# Patient Record
Sex: Male | Born: 1940 | Race: White | Hispanic: No | Smoking: Never smoker
Health system: Southern US, Community
[De-identification: ages and names within clinical notes are randomized; demographics above are authoritative.]

## PROBLEM LIST (undated history)

## (undated) DIAGNOSIS — N289 Disorder of kidney and ureter, unspecified: Secondary | ICD-10-CM

## (undated) DIAGNOSIS — E119 Type 2 diabetes mellitus without complications: Secondary | ICD-10-CM

## (undated) DIAGNOSIS — B59 Pneumocystosis: Secondary | ICD-10-CM

## (undated) DIAGNOSIS — I10 Essential (primary) hypertension: Secondary | ICD-10-CM

## (undated) DIAGNOSIS — IMO0001 Reserved for inherently not codable concepts without codable children: Secondary | ICD-10-CM

## (undated) DIAGNOSIS — I776 Arteritis, unspecified: Secondary | ICD-10-CM

## (undated) DIAGNOSIS — I7782 Antineutrophilic cytoplasmic antibody (ANCA) vasculitis: Secondary | ICD-10-CM

## (undated) DIAGNOSIS — I2510 Atherosclerotic heart disease of native coronary artery without angina pectoris: Secondary | ICD-10-CM

---

## 2015-12-25 ENCOUNTER — Observation Stay (HOSPITAL_COMMUNITY)
Admission: EM | Admit: 2015-12-25 | Discharge: 2015-12-27 | Disposition: A | Discharge status: Home / Self Care | Payer: Medicare HMO | Attending: Internal Medicine | Admitting: Internal Medicine

## 2015-12-25 ENCOUNTER — Encounter (HOSPITAL_COMMUNITY): Payer: Self-pay | Admitting: Emergency Medicine

## 2015-12-25 ENCOUNTER — Emergency Department (HOSPITAL_COMMUNITY): Payer: Medicare HMO

## 2015-12-25 DIAGNOSIS — I2510 Atherosclerotic heart disease of native coronary artery without angina pectoris: Secondary | ICD-10-CM | POA: Diagnosis not present

## 2015-12-25 DIAGNOSIS — W000XXA Fall on same level due to ice and snow, initial encounter: Secondary | ICD-10-CM | POA: Diagnosis not present

## 2015-12-25 DIAGNOSIS — S0990XA Unspecified injury of head, initial encounter: Secondary | ICD-10-CM | POA: Diagnosis present

## 2015-12-25 DIAGNOSIS — N186 End stage renal disease: Secondary | ICD-10-CM | POA: Diagnosis not present

## 2015-12-25 DIAGNOSIS — I509 Heart failure, unspecified: Secondary | ICD-10-CM | POA: Diagnosis not present

## 2015-12-25 DIAGNOSIS — E1122 Type 2 diabetes mellitus with diabetic chronic kidney disease: Secondary | ICD-10-CM | POA: Diagnosis not present

## 2015-12-25 DIAGNOSIS — Z992 Dependence on renal dialysis: Secondary | ICD-10-CM | POA: Diagnosis not present

## 2015-12-25 DIAGNOSIS — S0101XA Laceration without foreign body of scalp, initial encounter: Secondary | ICD-10-CM | POA: Insufficient documentation

## 2015-12-25 DIAGNOSIS — S065X2A Traumatic subdural hemorrhage with loss of consciousness of 31 minutes to 59 minutes, initial encounter: Secondary | ICD-10-CM | POA: Diagnosis not present

## 2015-12-25 DIAGNOSIS — F22 Delusional disorders: Secondary | ICD-10-CM | POA: Diagnosis not present

## 2015-12-25 DIAGNOSIS — Z7952 Long term (current) use of systemic steroids: Secondary | ICD-10-CM | POA: Diagnosis not present

## 2015-12-25 DIAGNOSIS — N250 Renal osteodystrophy: Secondary | ICD-10-CM | POA: Insufficient documentation

## 2015-12-25 DIAGNOSIS — S065X9A Traumatic subdural hemorrhage with loss of consciousness of unspecified duration, initial encounter: Secondary | ICD-10-CM

## 2015-12-25 DIAGNOSIS — Z794 Long term (current) use of insulin: Secondary | ICD-10-CM | POA: Insufficient documentation

## 2015-12-25 DIAGNOSIS — I132 Hypertensive heart and chronic kidney disease with heart failure and with stage 5 chronic kidney disease, or end stage renal disease: Secondary | ICD-10-CM | POA: Diagnosis not present

## 2015-12-25 DIAGNOSIS — S065XAA Traumatic subdural hemorrhage with loss of consciousness status unknown, initial encounter: Secondary | ICD-10-CM

## 2015-12-25 DIAGNOSIS — D631 Anemia in chronic kidney disease: Secondary | ICD-10-CM | POA: Diagnosis not present

## 2015-12-25 DIAGNOSIS — S065X2D Traumatic subdural hemorrhage with loss of consciousness of 31 minutes to 59 minutes, subsequent encounter: Secondary | ICD-10-CM

## 2015-12-25 DIAGNOSIS — S0100XA Unspecified open wound of scalp, initial encounter: Secondary | ICD-10-CM

## 2015-12-25 DIAGNOSIS — N189 Chronic kidney disease, unspecified: Secondary | ICD-10-CM

## 2015-12-25 HISTORY — DX: Disorder of kidney and ureter, unspecified: N28.9

## 2015-12-25 HISTORY — DX: Atherosclerotic heart disease of native coronary artery without angina pectoris: I25.10

## 2015-12-25 HISTORY — DX: Type 2 diabetes mellitus without complications: E11.9

## 2015-12-25 HISTORY — DX: Essential (primary) hypertension: I10

## 2015-12-25 LAB — CBC
HEMATOCRIT: 36.3 % — AB (ref 39.0–52.0)
Hemoglobin: 11.7 g/dL — ABNORMAL LOW (ref 13.0–17.0)
MCH: 28 pg (ref 26.0–34.0)
MCHC: 32.2 g/dL (ref 30.0–36.0)
MCV: 86.8 fL (ref 78.0–100.0)
Platelets: 203 10*3/uL (ref 150–400)
RBC: 4.18 MIL/uL — AB (ref 4.22–5.81)
RDW: 16.3 % — ABNORMAL HIGH (ref 11.5–15.5)
WBC: 10.3 10*3/uL (ref 4.0–10.5)

## 2015-12-25 LAB — APTT: APTT: 40 s — AB (ref 24–37)

## 2015-12-25 LAB — COMPREHENSIVE METABOLIC PANEL
ALT: 20 U/L (ref 17–63)
ANION GAP: 9 (ref 5–15)
AST: 19 U/L (ref 15–41)
Albumin: 2.7 g/dL — ABNORMAL LOW (ref 3.5–5.0)
Alkaline Phosphatase: 109 U/L (ref 38–126)
BUN: 28 mg/dL — ABNORMAL HIGH (ref 6–20)
CHLORIDE: 100 mmol/L — AB (ref 101–111)
CO2: 27 mmol/L (ref 22–32)
Calcium: 8.3 mg/dL — ABNORMAL LOW (ref 8.9–10.3)
Creatinine, Ser: 4.41 mg/dL — ABNORMAL HIGH (ref 0.61–1.24)
GFR, EST AFRICAN AMERICAN: 14 mL/min — AB (ref >60)
GFR, EST NON AFRICAN AMERICAN: 12 mL/min — AB (ref >60)
Glucose, Bld: 257 mg/dL — ABNORMAL HIGH (ref 65–99)
POTASSIUM: 4 mmol/L (ref 3.5–5.1)
Sodium: 136 mmol/L (ref 135–145)
Total Bilirubin: 0.8 mg/dL (ref 0.3–1.2)
Total Protein: 5.3 g/dL — ABNORMAL LOW (ref 6.5–8.1)

## 2015-12-25 LAB — CBG MONITORING, ED: Glucose-Capillary: 185 mg/dL — ABNORMAL HIGH (ref 65–99)

## 2015-12-25 LAB — PROTIME-INR
INR: 1.29 (ref 0.00–1.49)
PROTHROMBIN TIME: 16.2 s — AB (ref 11.6–15.2)

## 2015-12-25 MED ORDER — VITAMIN B-12 1000 MCG PO TABS
1000.0000 ug | ORAL_TABLET | Freq: Every day | ORAL | Status: DC
  Administered 2015-12-26 – 2015-12-27 (×2): 1000 ug via ORAL
  Filled 2015-12-25 (×2): qty 1

## 2015-12-25 MED ORDER — ONDANSETRON HCL 4 MG/2ML IJ SOLN: 4.0000 mg | Freq: Four times a day (QID) | INTRAMUSCULAR | Status: DC | PRN

## 2015-12-25 MED ORDER — SODIUM CHLORIDE 0.9 % IJ SOLN: 3.0000 mL | INTRAMUSCULAR | Status: DC | PRN

## 2015-12-25 MED ORDER — SODIUM CHLORIDE 0.9 % IV SOLN: 250.0000 mL | INTRAVENOUS | Status: DC | PRN

## 2015-12-25 MED ORDER — ONDANSETRON HCL 4 MG PO TABS: 4.0000 mg | ORAL_TABLET | Freq: Four times a day (QID) | ORAL | Status: DC | PRN

## 2015-12-25 MED ORDER — SODIUM CHLORIDE 0.9 % IJ SOLN
3.0000 mL | Freq: Two times a day (BID) | INTRAMUSCULAR | Status: DC
  Administered 2015-12-26 (×2): 3 mL via INTRAVENOUS

## 2015-12-25 MED ORDER — INSULIN ASPART 100 UNIT/ML ~~LOC~~ SOLN
0.0000 [IU] | Freq: Three times a day (TID) | SUBCUTANEOUS | Status: DC
  Administered 2015-12-26: 2 [IU] via SUBCUTANEOUS
  Administered 2015-12-26: 3 [IU] via SUBCUTANEOUS

## 2015-12-25 MED ORDER — SODIUM CHLORIDE 0.9 % IJ SOLN
3.0000 mL | Freq: Two times a day (BID) | INTRAMUSCULAR | Status: DC
  Administered 2015-12-26: 3 mL via INTRAVENOUS

## 2015-12-25 MED ORDER — FLUDROCORTISONE ACETATE 0.1 MG PO TABS
0.1000 mg | ORAL_TABLET | Freq: Every day | ORAL | Status: DC
  Administered 2015-12-26 – 2015-12-27 (×2): 0.1 mg via ORAL
  Filled 2015-12-25 (×2): qty 1

## 2015-12-25 NOTE — ED Notes (Addendum)
Per EMS: pt slipped on ice and fell and thinks had LOC; pt with laceration to back of head; bleeding controlled; pt dialysis pt with last dialysis was last Thursday; pt with unequal pupils per norm

## 2015-12-25 NOTE — H&P (Signed)
[facility] History and Physical  Quinten Allerton WJX:[id number] DOB: Mar 07, 1941 DOA: 12/25/2015  Referring physician:  Loren Racer, MD PCP: Theressa Stamps, MD   Chief Complaint: Fall with LOC  HPI: Jeffrey Walls is a 75 y.o. male with a past medical history of end-stage renal disease on hemodialysis, hypertension, type 2 diabetes who comes to the emergency department after having a fall at home in his driveway.   Per patient, he does not remember much of what happened. Apparently he slipped, hit the back of his head sustaining a laceration and then later woke up, and what he describes as a puddle of blood. After that, he felt some headache, dizziness, nausea and had 1 episode of emesis. He denies subsequently emesis and has no nausea at the moment. He denies any prodromal symptoms prior to the fall and states that this was strictly an accident. He denies vision changes, decreased sensory or motor weakness. He is currently in no acute distress.   Review of Systems:  Constitutional:  No weight loss, night sweats, Fevers, chills, fatigue.  HEENT:  No headaches, Difficulty swallowing,Tooth/dental problems,Sore throat,  No sneezing, itching, ear ache, nasal congestion, post nasal drip,  Cardio-vascular:  No chest pain, Orthopnea, PND, swelling in lower extremities, anasarca, dizziness, palpitations  GI:  Positive nausea, vomiting. No heartburn, indigestion, abdominal pain,  diarrhea, change in bowel habits, loss of appetite  Resp:  No shortness of breath with exertion or at rest. No excess mucus, no productive cough, No non-productive cough, No coughing up of blood.No change in color of mucus.No wheezing.No chest wall deformity  Skin:  No rash or lesions.  GU:  No dysuria, change in color of urine, no urgency or frequency. No flank pain.  Musculoskeletal:  No joint pain or swelling. No decreased range of motion. No back pain.  Psych:  No change in mood or affect. No  depression or anxiety. No memory loss.  Neuro: As above mentioned.  Past Medical History  Diagnosis Date  . Renal disorder   . Hypertension   . Diabetes mellitus without complication (HCC)   . Coronary artery disease    History reviewed. No pertinent past surgical history. Social History:  reports that he has never smoked. He does not have any smokeless tobacco history on file. He reports that he does not drink alcohol or use illicit drugs.  Allergies  Allergen Reactions  . Ampicillin     Ankle swelling  . Benadryl [Diphenhydramine Hcl]     Rash    History reviewed. No pertinent family history.    Prior to Admission medications   Medication Sig Start Date End Date Taking? Authorizing Provider  FLUDROCORTISONE ACETATE PO Take 0.1 mg by mouth daily.   Yes Historical Provider, MD  insulin aspart (NOVOLOG) 100 UNIT/ML injection Inject 2-6 Units into the skin 3 (three) times daily before meals. Per sliding scale   Yes Historical Provider, MD  vitamin B-12 (CYANOCOBALAMIN) 1000 MCG tablet Take 1,000 mcg by mouth daily.   Yes Historical Provider, MD   Physical Exam: Filed Vitals:   12/25/15 1403 12/25/15 1718 12/25/15 1848 12/25/15 2000  BP: 187/111 145/96 180/107 176/99  Pulse: 106 99 94 86  Temp: 97.9 F (36.6 C) 98.9 F (37.2 C)    TempSrc: Oral Oral    Resp: 20 18 18    SpO2: 97% 99% 99% 98%    Wt Readings from Last 3 Encounters:  No data found for Wt    General:  Appears calm and comfortable  Head: Occipital laceration with dressing in place. Eyes: Preexisting anisocoria, right pupil > left pupil normal lids, irises & conjunctiva ENT: grossly normal hearing, lips & tongue Neck: no LAD, masses or thyromegaly Cardiovascular: RRR, no m/r/g. No LE edema. Telemetry: SR, no arrhythmias  Respiratory: CTA bilaterally, no w/r/r. Normal respiratory effort. Abdomen: soft, ntnd Skin: no rash or induration seen on limited exam Musculoskeletal: grossly normal tone  BUE/BLE Psychiatric: grossly normal mood and affect, speech fluent and appropriate Neurologic: grossly non-focal.          Labs on Admission:  Basic Metabolic Panel:  Recent Labs Lab 12/25/15 1412  NA 136  K 4.0  CL 100*  CO2 27  GLUCOSE 257*  BUN 28*  CREATININE 4.41*  CALCIUM 8.3*   Liver Function Tests:  Recent Labs Lab 12/25/15 1412  AST 19  ALT 20  ALKPHOS 109  BILITOT 0.8  PROT 5.3*  ALBUMIN 2.7*   CBC:  Recent Labs Lab 12/25/15 1412  WBC 10.3  HGB 11.7*  HCT 36.3*  MCV 86.8  PLT 203    CBG:  Recent Labs Lab 12/25/15 1833  GLUCAP 185*    Radiological Exams on Admission: Ct Head Wo Contrast  12/25/2015  CLINICAL DATA:  Posttraumatic headache and posterior neck pain after fall slipping on ice. Positive loss of consciousness. EXAM: CT HEAD WITHOUT CONTRAST CT CERVICAL SPINE WITHOUT CONTRAST TECHNIQUE: Multidetector CT imaging of the head and cervical spine was performed following the standard protocol without intravenous contrast. Multiplanar CT image reconstructions of the cervical spine were also generated. COMPARISON:  None. FINDINGS: CT HEAD FINDINGS Bony calvarium appears intact. Left posterior scalp hematoma is noted. Mild diffuse cortical atrophy is noted. Mild chronic ischemic white matter disease is noted. Small left subdural hematoma is seen over left frontal region. Similar abnormality is seen over right frontal lobe. No mass effect or midline shift is noted. Ventricular size is within normal limits. No mass lesion or acute infarction is noted. CT CERVICAL SPINE FINDINGS No fracture is noted. Moderate degenerative disc disease is noted at C3-4, C4-5 and C5-6 with anterior and posterior osteophyte formation. Severe degenerative disc disease is noted at C6-7 and C7-T1 with anterior and posterior osteophyte formation. Grade 1 retrolisthesis of C4-5 is noted most likely due to degenerative disc disease. Visualized lung apices are unremarkable.  IMPRESSION: Left posterior scalp hematoma. Mild diffuse cortical atrophy. Mild chronic ischemic white matter disease. Small bilateral frontal subdural hematomas are noted without mass effect or midline shift. 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 No acute abnormality is noted. Critical Value/emergent results were called by telephone at the time of interpretation on 12/25/2015 at 7:27 pm to Dr. Loren Racer , who verbally acknowledged these results. Electronically Signed   By: Lupita Raider, M.D.   On: 12/25/2015 19:30   Ct Cervical Spine Wo Contrast  12/25/2015  CLINICAL DATA:  Posttraumatic headache and posterior neck pain after fall slipping on ice. Positive loss of consciousness. EXAM: CT HEAD WITHOUT CONTRAST CT CERVICAL SPINE WITHOUT CONTRAST TECHNIQUE: Multidetector CT imaging of the head and cervical spine was performed following the standard protocol without intravenous contrast. Multiplanar CT image reconstructions of the cervical spine were also generated. COMPARISON:  None. FINDINGS: CT HEAD FINDINGS Bony calvarium appears intact. Left posterior scalp hematoma is noted. Mild diffuse cortical atrophy is noted. Mild chronic ischemic white matter disease is noted. Small left subdural hematoma is seen over left frontal region. Similar abnormality is  seen over right frontal lobe. No mass effect or midline shift is noted. Ventricular size is within normal limits. No mass lesion or acute infarction is noted. CT CERVICAL SPINE FINDINGS No fracture is noted. Moderate degenerative disc disease is noted at C3-4, C4-5 and C5-6 with anterior and posterior osteophyte formation. Severe degenerative disc disease is noted at C6-7 and C7-T1 with anterior and posterior osteophyte formation. Grade 1 retrolisthesis of C4-5 is noted most likely due to degenerative disc disease. Visualized lung apices are unremarkable. IMPRESSION: Left posterior scalp hematoma. Mild  diffuse cortical atrophy. Mild chronic ischemic white matter disease. Small bilateral frontal subdural hematomas are noted without mass effect or midline shift. 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 No acute abnormality is noted. Critical Value/emergent results were called by telephone at the time of interpretation on 12/25/2015 at 7:27 pm to Dr. Loren RacerAVID YELVERTON , who verbally acknowledged these results. Electronically Signed   By: Lupita RaiderJames  Green Jr, M.D.   On: 12/25/2015 19:30     Assessment/Plan Principal Problem:   Subdural hematoma caused by concussion (HCC) Admit to telemetry for observation. Frequent neuro checks. Recheck CT scan of the head without contrast in the morning. Neurosurgery will evaluate in a.m. after repeat CT scan of head.  Active Problems:   Hypertension Patient is not sure as to which medications he is taking. Pharmacy to get a prescription information from his local [facility]. Obtain medical records from [facility]. Antihypertensive medications as needed.    ESRD on hemodialysis [facility](HCC)  continue hemodialysis as per nephrology.    Diabetes mellitus, type 2 (HCC) CBG monitoring with regular insulin sliding scale. Check hemoglobin A1c.    Anemia of renal disease Monitor hematocrit and hemoglobin periodically. Erythropoietin supplementation per nephrology as needed.  Neurosurgery was consulted by the emergency department. (Dr. Conchita ParisNundkumar) Dr. Marisue HumbleSanford was consulted for nephrology evaluation in the morning.   Code Status: Full code. DVT Prophylaxis: SCDs. Family Communication: Disposition Plan: Admit for neurosurgery and nephrology evaluation in the morning.   Time spent: Over 70 minutes were spent in the process of this admission.  Bobette Moavid Manuel Jathan Balling [facility] Pager [phone].

## 2015-12-25 NOTE — ED Notes (Signed)
Took pt to ct

## 2015-12-25 NOTE — ED Provider Notes (Signed)
CSN: [id number]     Arrival date & time 12/25/15  1402 History   First MD Initiated Contact with Patient 12/25/15 1745     Chief Complaint  Patient presents with  . Fall     (Consider location/radiation/quality/duration/timing/severity/associated sxs/prior Treatment) HPI Patient states that earlier this morning around 11 AM he slipped on ice in his driveway and fell and struck the back of his head. He had loss of consciousness. He woke with a palpable blood around his head. States he was going to dialysis but he was instructed to come to the emergency department to be evaluated for head injury. He has a mild headache at this time and some mild lightheadedness. He had one episode of vomiting earlier but now denies any nausea. He has no visual changes. He has no focal weakness or numbness. He denies any neck pain. Past Medical History  Diagnosis Date  . Renal disorder   . Hypertension   . Diabetes mellitus without complication (HCC)   . Coronary artery disease    History reviewed. No pertinent past surgical history. History reviewed. No pertinent family history. Social History  Substance Use Topics  . Smoking status: Never Smoker   . Smokeless tobacco: None  . Alcohol Use: No    Review of Systems  Constitutional: Negative for fever and chills.  Eyes: Negative for visual disturbance.  Respiratory: Negative for shortness of breath.   Cardiovascular: Negative for chest pain.  Gastrointestinal: Positive for nausea and vomiting. Negative for abdominal pain, diarrhea and constipation.  Musculoskeletal: Negative for myalgias, back pain and neck pain.  Skin: Positive for wound. Negative for rash.  Neurological: Positive for syncope and headaches. Negative for dizziness, weakness, light-headedness and numbness.  All other systems reviewed and are negative.     Allergies  Ampicillin and Benadryl  Home Medications   Prior to Admission medications   Medication Sig Start Date End  Date Taking? Authorizing Provider  FLUDROCORTISONE ACETATE PO Take 0.1 mg by mouth daily.   Yes Historical Provider, MD  insulin aspart (NOVOLOG) 100 UNIT/ML injection Inject 2-6 Units into the skin 3 (three) times daily before meals. Per sliding scale   Yes Historical Provider, MD  vitamin B-12 (CYANOCOBALAMIN) 1000 MCG tablet Take 1,000 mcg by mouth daily.   Yes Historical Provider, MD   BP 176/99 mmHg  Pulse 86  Temp(Src) 98.9 F (37.2 C) (Oral)  Resp 18  SpO2 98% Physical Exam  Constitutional: He is oriented to person, place, and time. He appears well-developed and well-nourished. No distress.  HENT:  Head: Normocephalic.  Mouth/Throat: Oropharynx is clear and moist.  Hematoma to the left occipital scalp. There is a small laceration with some oozing from the site.  Eyes: EOM are normal. Pupils are equal, round, and reactive to light.  Neck: Normal range of motion. Neck supple.  No posterior midline cervical tenderness to palpation.  Cardiovascular: Normal rate and regular rhythm.  Exam reveals no gallop and no friction rub.   No murmur heard. Pulmonary/Chest: Effort normal and breath sounds normal. No respiratory distress. He has no wheezes. He has no rales.  Patient with dialysis catheter and the right chest. No erythema or warmth.  Abdominal: Soft. Bowel sounds are normal. He exhibits no distension and no mass. There is no tenderness. There is no rebound and no guarding.  Musculoskeletal: Normal range of motion. He exhibits no edema or tenderness.  No midline thoracic or lumbar tenderness.  Neurological: He is alert and oriented to person, place,  and time.  Moving all extremities without deficit. Sensation fully intact.  Skin: Skin is warm and dry. No rash noted. No erythema.  Psychiatric: He has a normal mood and affect. His behavior is normal.  Nursing note and vitals reviewed.   ED Course  Procedures (including critical care time) Labs Review Labs Reviewed   COMPREHENSIVE METABOLIC PANEL - Abnormal; Notable for the following:    Chloride 100 (*)    Glucose, Bld 257 (*)    BUN 28 (*)    Creatinine, Ser 4.41 (*)    Calcium 8.3 (*)    Total Protein 5.3 (*)    Albumin 2.7 (*)    GFR calc non Af Amer 12 (*)    GFR calc Af Amer 14 (*)    All other components within normal limits  CBC - Abnormal; Notable for the following:    RBC 4.18 (*)    Hemoglobin 11.7 (*)    HCT 36.3 (*)    RDW 16.3 (*)    All other components within normal limits  PROTIME-INR - Abnormal; Notable for the following:    Prothrombin Time 16.2 (*)    All other components within normal limits  APTT - Abnormal; Notable for the following:    aPTT 40 (*)    All other components within normal limits  CBG MONITORING, ED - Abnormal; Notable for the following:    Glucose-Capillary 185 (*)    All other components within normal limits    Imaging Review Ct Head Wo Contrast  12/25/2015  CLINICAL DATA:  Posttraumatic headache and posterior neck pain after fall slipping on ice. Positive loss of consciousness. EXAM: CT HEAD WITHOUT CONTRAST CT CERVICAL SPINE WITHOUT CONTRAST TECHNIQUE: Multidetector CT imaging of the head and cervical spine was performed following the standard protocol without intravenous contrast. Multiplanar CT image reconstructions of the cervical spine were also generated. COMPARISON:  None. FINDINGS: CT HEAD FINDINGS Bony calvarium appears intact. Left posterior scalp hematoma is noted. Mild diffuse cortical atrophy is noted. Mild chronic ischemic white matter disease is noted. Small left subdural hematoma is seen over left frontal region. Similar abnormality is seen over right frontal lobe. No mass effect or midline shift is noted. Ventricular size is within normal limits. No mass lesion or acute infarction is noted. CT CERVICAL SPINE FINDINGS No fracture is noted. Moderate degenerative disc disease is noted at C3-4, C4-5 and C5-6 with anterior and posterior osteophyte  formation. Severe degenerative disc disease is noted at C6-7 and C7-T1 with anterior and posterior osteophyte formation. Grade 1 retrolisthesis of C4-5 is noted most likely due to degenerative disc disease. Visualized lung apices are unremarkable. IMPRESSION: Left posterior scalp hematoma. Mild diffuse cortical atrophy. Mild chronic ischemic white matter disease. Small bilateral frontal subdural hematomas are noted without mass effect or midline shift. 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 No acute abnormality is noted. Critical Value/emergent results were called by telephone at the time of interpretation on 12/25/2015 at 7:27 pm to Dr. Loren Racer , who verbally acknowledged these results. Electronically Signed   By: Lupita Raider, M.D.   On: 12/25/2015 19:30   Ct Cervical Spine Wo Contrast  12/25/2015  CLINICAL DATA:  Posttraumatic headache and posterior neck pain after fall slipping on ice. Positive loss of consciousness. EXAM: CT HEAD WITHOUT CONTRAST CT CERVICAL SPINE WITHOUT CONTRAST TECHNIQUE: Multidetector CT imaging of the head and cervical spine was performed following the standard protocol without intravenous contrast.  Multiplanar CT image reconstructions of the cervical spine were also generated. COMPARISON:  None. FINDINGS: CT HEAD FINDINGS Bony calvarium appears intact. Left posterior scalp hematoma is noted. Mild diffuse cortical atrophy is noted. Mild chronic ischemic white matter disease is noted. Small left subdural hematoma is seen over left frontal region. Similar abnormality is seen over right frontal lobe. No mass effect or midline shift is noted. Ventricular size is within normal limits. No mass lesion or acute infarction is noted. CT CERVICAL SPINE FINDINGS No fracture is noted. Moderate degenerative disc disease is noted at C3-4, C4-5 and C5-6 with anterior and posterior osteophyte formation. Severe degenerative disc disease is noted  at C6-7 and C7-T1 with anterior and posterior osteophyte formation. Grade 1 retrolisthesis of C4-5 is noted most likely due to degenerative disc disease. Visualized lung apices are unremarkable. IMPRESSION: Left posterior scalp hematoma. Mild diffuse cortical atrophy. Mild chronic ischemic white matter disease. Small bilateral frontal subdural hematomas are noted without mass effect or midline shift. 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 No acute abnormality is noted. Critical Value/emergent results were called by telephone at the time of interpretation on 12/25/2015 at 7:27 pm to Dr. Loren Racer , who verbally acknowledged these results. Electronically Signed   By: Lupita Raider, M.D.   On: 12/25/2015 19:30   I have personally reviewed and evaluated these images and lab results as part of my medical decision-making.   EKG Interpretation None      MDM   Final diagnoses:  Subdural hematoma (HCC)  Scalp wound, initial encounter   Patient with 2 small subdural hematomas. No evidence of mass effect. Discussed with Dr. Conchita Paris. He agrees with plan to admit for observation with repeat head CT in the morning. Will see patient and review CT scan in the morning. Discussed with Dr. Robb Matar. Will admit to telemetry bed.     Loren Racer, MD 12/25/15 2133

## 2015-12-26 ENCOUNTER — Encounter (HOSPITAL_COMMUNITY): Payer: Self-pay

## 2015-12-26 ENCOUNTER — Observation Stay (HOSPITAL_COMMUNITY): Payer: Medicare HMO

## 2015-12-26 DIAGNOSIS — D631 Anemia in chronic kidney disease: Secondary | ICD-10-CM | POA: Diagnosis not present

## 2015-12-26 DIAGNOSIS — S065X0A Traumatic subdural hemorrhage without loss of consciousness, initial encounter: Secondary | ICD-10-CM

## 2015-12-26 LAB — COMPREHENSIVE METABOLIC PANEL
ALBUMIN: 2.2 g/dL — AB (ref 3.5–5.0)
ALK PHOS: 89 U/L (ref 38–126)
ALT: 13 U/L — AB (ref 17–63)
ANION GAP: 8 (ref 5–15)
AST: 14 U/L — AB (ref 15–41)
BUN: 30 mg/dL — ABNORMAL HIGH (ref 6–20)
CALCIUM: 7.8 mg/dL — AB (ref 8.9–10.3)
CHLORIDE: 103 mmol/L (ref 101–111)
CO2: 25 mmol/L (ref 22–32)
Creatinine, Ser: 4.21 mg/dL — ABNORMAL HIGH (ref 0.61–1.24)
GFR calc Af Amer: 15 mL/min — ABNORMAL LOW (ref >60)
GFR calc non Af Amer: 13 mL/min — ABNORMAL LOW (ref >60)
GLUCOSE: 183 mg/dL — AB (ref 65–99)
Potassium: 3.5 mmol/L (ref 3.5–5.1)
SODIUM: 136 mmol/L (ref 135–145)
Total Bilirubin: 0.7 mg/dL (ref 0.3–1.2)
Total Protein: 4 g/dL — ABNORMAL LOW (ref 6.5–8.1)

## 2015-12-26 LAB — GLUCOSE, CAPILLARY
GLUCOSE-CAPILLARY: 258 mg/dL — AB (ref 65–99)
Glucose-Capillary: 115 mg/dL — ABNORMAL HIGH (ref 65–99)
Glucose-Capillary: 158 mg/dL — ABNORMAL HIGH (ref 65–99)
Glucose-Capillary: 205 mg/dL — ABNORMAL HIGH (ref 65–99)
Glucose-Capillary: 260 mg/dL — ABNORMAL HIGH (ref 65–99)

## 2015-12-26 LAB — CBC WITH DIFFERENTIAL/PLATELET
Basophils Absolute: 0 10*3/uL (ref 0.0–0.1)
Basophils Relative: 0 %
Eosinophils Absolute: 0.1 10*3/uL (ref 0.0–0.7)
Eosinophils Relative: 1 %
HEMATOCRIT: 29.9 % — AB (ref 39.0–52.0)
HEMOGLOBIN: 9.9 g/dL — AB (ref 13.0–17.0)
LYMPHS ABS: 1.8 10*3/uL (ref 0.7–4.0)
LYMPHS PCT: 21 %
MCH: 28.7 pg (ref 26.0–34.0)
MCHC: 33.1 g/dL (ref 30.0–36.0)
MCV: 86.7 fL (ref 78.0–100.0)
MONO ABS: 1.2 10*3/uL — AB (ref 0.1–1.0)
MONOS PCT: 14 %
NEUTROS ABS: 5.4 10*3/uL (ref 1.7–7.7)
Neutrophils Relative %: 64 %
Platelets: 190 10*3/uL (ref 150–400)
RBC: 3.45 MIL/uL — ABNORMAL LOW (ref 4.22–5.81)
RDW: 16.5 % — AB (ref 11.5–15.5)
WBC: 8.5 10*3/uL (ref 4.0–10.5)

## 2015-12-26 MED ORDER — PENTAFLUOROPROP-TETRAFLUOROETH EX AERO: 1.0000 "application " | INHALATION_SPRAY | CUTANEOUS | Status: DC | PRN

## 2015-12-26 MED ORDER — ACETAMINOPHEN 325 MG PO TABS
650.0000 mg | ORAL_TABLET | Freq: Four times a day (QID) | ORAL | Status: DC | PRN
  Administered 2015-12-26: 650 mg via ORAL
  Filled 2015-12-26: qty 2

## 2015-12-26 MED ORDER — SODIUM CHLORIDE 0.9 % IV SOLN: 100.0000 mL | INTRAVENOUS | Status: DC | PRN

## 2015-12-26 MED ORDER — LIDOCAINE HCL (PF) 1 % IJ SOLN: 5.0000 mL | INTRAMUSCULAR | Status: DC | PRN

## 2015-12-26 MED ORDER — LIDOCAINE-PRILOCAINE 2.5-2.5 % EX CREA
1.0000 "application " | TOPICAL_CREAM | CUTANEOUS | Status: DC | PRN
  Filled 2015-12-26: qty 5

## 2015-12-26 NOTE — Consult Note (Signed)
Hospital Consult    Reason for Consult:  In need of permanent HD access Referring Physician:  Juel BurrowLin MRN #:  [id number]  History of Present Illness: This is a 75 y.o. male with hx of ESRD on HD, HTN, CHF, CAD, and diabetes on insulin who presented to the hospital after a fall.   He states that he lost consciousness for 45 minutes.      He has been followed by [facility] in the past for his renal disease.  He states he had been seen by [facility], but that they wouldn't see him again per the pt.   [facility] is consulted by Dr. Juel BurrowLin for permanent HD access placement.  The pt is right hand dominant and dialyzes T/T/S.  Past Medical History  Diagnosis Date  . Renal disorder   . Hypertension   . Diabetes mellitus without complication (HCC)   . Coronary artery disease     History reviewed. No pertinent past surgical history.  Allergies  Allergen Reactions  . Ampicillin     Ankle swelling  . Benadryl [Diphenhydramine Hcl]     Rash    Prior to Admission medications   Medication Sig Start Date End Date Taking? Authorizing Provider  FLUDROCORTISONE ACETATE PO Take 0.1 mg by mouth daily.   Yes Historical Provider, MD  insulin aspart (NOVOLOG) 100 UNIT/ML injection Inject 2-6 Units into the skin 3 (three) times daily before meals. Per sliding scale   Yes Historical Provider, MD  vitamin B-12 (CYANOCOBALAMIN) 1000 MCG tablet Take 1,000 mcg by mouth daily.   Yes Historical Provider, MD    Social History   Social History  . Marital Status: Divorced    Spouse Name: N/A  . Number of Children: N/A  . Years of Education: N/A   Occupational History  . Not on file.   Social History Main Topics  . Smoking status: Never Smoker   . Smokeless tobacco: Not on file  . Alcohol Use: No  . Drug Use: No  . Sexual Activity: Not on file   Other Topics Concern  . Not on file   Social History Narrative     History reviewed. No pertinent family history.  ROS: [x]  Positive   [ ]  Negative   [ ]   All sytems reviewed and are negative  Cardiovascular: []  chest pain/pressure []  palpitations []  SOB lying flat []  DOE  Pulmonary: []  productive cough []  asthma/wheezing []  home O2  Neurologic: []  weakness in []  arms []  legs []  numbness in []  arms []  legs []  hx of CVA []  mini stroke [] difficulty speaking or slurred speech []  temporary loss of vision in one eye []  dizziness [x]  SDH (LOC after fall on the ice)  Hematologic: []  hx of cancer []  bleeding problems []  problems with blood clotting easily  Endocrine:   [x]  diabetes []  thyroid disease  GI []  vomiting blood []  blood in stool  GU: [x]  CKD/renal failure []  HD--[]  M/W/F or [x]  T/T/S []  burning with urination []  blood in urine  Psychiatric: [x]  anxiety []  depression  Musculoskeletal: []  arthritis []  joint pain  Integumentary: []  rashes []  ulcers  Constitutional: []  fever []  chills   Physical Examination  Filed Vitals:   12/26/15 1350 12/26/15 1405  BP: 151/69 156/96  Pulse: 87 92  Temp:    Resp:     There is no height on file to calculate BMI.  General:  WDWN in NAD Gait: Not observed HENT: WNL, normocephalic Pulmonary: normal non-labored breathing Cardiac: regular Skin:  without rashes Vascular Exam/Pulses:  Right Left  Radial 2+ (normal) 2+ (normal)   Extremities: without ischemic changes, without Gangrene , without cellulitis; without open wounds; left cephalic vein at the antecubital space appears to be good caliber vein. Musculoskeletal: no muscle wasting or atrophy  Neurologic: A&O X 3; Appropriate Affect ; SENSATION: normal; MOTOR FUNCTION:  moving all extremities equally. Speech is fluent/normal   CBC    Component Value Date/Time   WBC 8.5 12/26/2015 0518   RBC 3.45* 12/26/2015 0518   HGB 9.9* 12/26/2015 0518   HCT 29.9* 12/26/2015 0518   PLT 190 12/26/2015 0518   MCV 86.7 12/26/2015 0518   MCH 28.7 12/26/2015 0518   MCHC 33.1 12/26/2015 0518   RDW 16.5* 12/26/2015  0518   LYMPHSABS 1.8 12/26/2015 0518   MONOABS 1.2* 12/26/2015 0518   EOSABS 0.1 12/26/2015 0518   BASOSABS 0.0 12/26/2015 0518    BMET    Component Value Date/Time   NA 136 12/26/2015 0518   K 3.5 12/26/2015 0518   CL 103 12/26/2015 0518   CO2 25 12/26/2015 0518   GLUCOSE 183* 12/26/2015 0518   BUN 30* 12/26/2015 0518   CREATININE 4.21* 12/26/2015 0518   CALCIUM 7.8* 12/26/2015 0518   GFRNONAA 13* 12/26/2015 0518   GFRAA 15* 12/26/2015 0518    COAGS: Lab Results  Component Value Date   INR 1.29 12/25/2015     Non-Invasive Vascular Imaging:   None-has good caliber left cephalic vein at the antecubital space  Statin:  No. Beta Blocker:  No. Aspirin:  No. ACEI:  No. ARB:  No. Other antiplatelets/anticoagulants:  No.    ASSESSMENT/PLAN: This is a 75 y.o. male with recent SDH with ESRD in need of permanent HD access.    -the pt dialyzes on T/T/S & is right hand dominant.   -on physical exam, the pt does appear to have a good caliber left cephalic vein at the antecubital space.  -the pt is supposed to be discharged today after HD -we will set up for him to have a left brachiocephalic AVF on a non-dialysis (M/W/F) day.  Our office will contact the pt for scheduling. -he currently dialyzes via a right IJ perm catheter, which was placed in the [facility] system.   Doreatha Massed, PA-C [facility] [phone]  I have examined the patient, reviewed and agree with above. Discussed with patient. His outpatient hemodialysis on Tuesday Thursday Saturday via his right IJ catheter. A physical exam he does have a nice caliber antecubital vein and upper arm cephalic vein. There had been some confusion in the past. Had seen surgeons at [facility] with confusion regarding plan. Explained that we are prepared to proceed with left arm AV fistula creation as an outpatient on a nondialysis day at his convenience  Gretta Began, MD 12/26/2015 5:11 PM

## 2015-12-26 NOTE — Consult Note (Signed)
CC:  Chief Complaint  Patient presents with  . Fall    HPI: Jeffrey Walls is a 75 y.o. male admitted through the ED after suffering a fall while walking out to his car to go to dialysis. He says he does not remember the fall or immediately afterward. He was able to go back in the house where he noted a laceration on the back of his head and was instructed by his dialysis center to go to the ED. Currently, he has had significant improvement in his HA, and denies any other symptoms including visual changes, N/T/W.  PMH: Past Medical History  Diagnosis Date  . Renal disorder   . Hypertension   . Diabetes mellitus without complication (HCC)   . Coronary artery disease     PSH: History reviewed. No pertinent past surgical history.  SH: Social History  Substance Use Topics  . Smoking status: Never Smoker   . Smokeless tobacco: None  . Alcohol Use: No    MEDS: Prior to Admission medications   Medication Sig Start Date End Date Taking? Authorizing Provider  FLUDROCORTISONE ACETATE PO Take 0.1 mg by mouth daily.   Yes Historical Provider, MD  insulin aspart (NOVOLOG) 100 UNIT/ML injection Inject 2-6 Units into the skin 3 (three) times daily before meals. Per sliding scale   Yes Historical Provider, MD  vitamin B-12 (CYANOCOBALAMIN) 1000 MCG tablet Take 1,000 mcg by mouth daily.   Yes Historical Provider, MD    ALLERGY: Allergies  Allergen Reactions  . Ampicillin     Ankle swelling  . Benadryl [Diphenhydramine Hcl]     Rash    ROS: ROS  NEUROLOGIC EXAM: Awake, alert, oriented Memory and concentration grossly intact Speech fluent, appropriate CN grossly intact Motor exam: Upper Extremities Deltoid Bicep Tricep Grip  Right 5/5 5/5 5/5 5/5  Left 5/5 5/5 5/5 5/5   Lower Extremity IP Quad PF DF EHL  Right 5/5 5/5 5/5 5/5 5/5  Left 5/5 5/5 5/5 5/5 5/5   Sensation grossly intact to LT  IMGAING: CTH today was reviewed and compared to yesterday. This demonstrates  stable minimal acute component of bilateral SDH. There is slight increase in the left subdural fluid collection but no local mass effect, midline shift, or hydrocephalus.  IMPRESSION: - 75 y.o. male s/p fall with minimal acute component of SDH, and essentially stable CT. He remains neurologically intact.  PLAN: - Stable for d/c from neurosurgical standpoint - can f/u in my office in about 2-3 weeks. - Would hold any blood thinners/antiplatelet agents until follow up.

## 2015-12-26 NOTE — Progress Notes (Signed)
Patient arrived from the ED to 5M18 at 2320, A/O X4. Patient oriented to room and equipment. MAEW. VSS. Cardiac monitoring initiated per orders. All safety measures in place. Will monitor closely overnight.

## 2015-12-26 NOTE — Consult Note (Addendum)
Reason for Consult: ESRD Referring Physician:  Dr. Tennis Must  Chief Complaint: Fall   Assessment/Plan: 1. SDH - developed after a fall while going to dialysis. Repeat CT on 12/26/2015 - left side increase in size/depth but clinically pt doing well. No complaints of increased pain, alert and cooperative + appropriate. 2. 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 - Last dialysis previous Thur. - Already written for HD today --> no heparin with the recent SDH. - Patient interesting enough thinks that this is all acute and that he may still recover renal function. I will continue educating and counseling him.  3. 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 iPTH management as outpt. 5. Hypertension - adequate control. 6. DM 7. Walking Corpse Syndrome   HPI: Jeffrey Walls is an 75 y.o. 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 He also carries the diagnosis of Walking Corpse Syndrome and interestingly thinks this is all acute with renal recovery still possible. His last HD treatment was in [place] on Thursday, missed HD Sat secondary to the snow and sustained a fall while walking out of the house yesterday. He doesn't remember the fall and was told to go to the ED where a CT diagnosed a subdural hematoma. He currently denies any worsening of pain in the traumatized area, also denies diplopia, dizziness, headaches or nausea. He also states that he has good UOP with no dyspnea.  ROS Pertinent items are noted in HPI.  Chemistry and CBC: CREATININE, SER  Date/Time Value Ref Range Status  12/26/2015 05:18 AM 4.21* 0.61 - 1.24 mg/dL Final  12/25/2015 02:12 PM 4.41* 0.61 - 1.24 mg/dL Final    Recent Labs Lab 12/25/15 1412 12/26/15 0518   NA 136 136  K 4.0 3.5  CL 100* 103  CO2 27 25  GLUCOSE 257* 183*  BUN 28* 30*  CREATININE 4.41* 4.21*  CALCIUM 8.3* 7.8*    Recent Labs Lab 12/25/15 1412 12/26/15 0518  WBC 10.3 8.5  NEUTROABS  --  5.4  HGB 11.7* 9.9*  HCT 36.3* 29.9*  MCV 86.8 86.7  PLT 203 190   Liver Function Tests:  Recent Labs Lab 12/25/15 1412 12/26/15 0518  AST 19 14*  ALT 20 13*  ALKPHOS 109 89  BILITOT 0.8 0.7  PROT 5.3* 4.0*  ALBUMIN 2.7* 2.2*   No results for input(s): LIPASE, AMYLASE in the last 168 hours. No results for input(s): AMMONIA in the last 168 hours. Cardiac Enzymes: No results for input(s): CKTOTAL, CKMB, CKMBINDEX, TROPONINI in the last 168 hours. Iron Studies: No results for input(s): IRON, TIBC, TRANSFERRIN, FERRITIN in the last 72 hours. PT/INR: _0 (inr:5)  Xrays/Other Studies: ) Results for orders placed or performed during the hospital encounter of 12/25/15 (from the past 48 hour(s))  Comprehensive metabolic panel     Status: Abnormal   Collection Time: 12/25/15  2:12 PM  Result Value Ref Range   Sodium 136 135 - 145 mmol/L   Potassium 4.0 3.5 - 5.1 mmol/L   Chloride 100 (L) 101 - 111 mmol/L   CO2 27 22 - 32 mmol/L   Glucose, Bld 257 (H) 65 - 99 mg/dL   BUN 28 (H) 6 - 20 mg/dL   Creatinine, Ser 4.41 (H) 0.61 - 1.24 mg/dL  Calcium 8.3 (L) 8.9 - 10.3 mg/dL   Total Protein 5.3 (L) 6.5 - 8.1 g/dL   Albumin 2.7 (L) 3.5 - 5.0 g/dL   AST 19 15 - 41 U/L   ALT 20 17 - 63 U/L   Alkaline Phosphatase 109 38 - 126 U/L   Total Bilirubin 0.8 0.3 - 1.2 mg/dL   GFR calc non Af Amer 12 (L) >60 mL/min   GFR calc Af Amer 14 (L) >60 mL/min    Comment: (NOTE) The eGFR has been calculated using the CKD EPI equation. This calculation has not been validated in all clinical situations. eGFR's persistently <60 mL/min signify possible Chronic Kidney Disease.    Anion gap 9 5 - 15  CBC     Status: Abnormal   Collection Time: 12/25/15  2:12 PM  Result Value Ref Range    WBC 10.3 4.0 - 10.5 K/uL   RBC 4.18 (L) 4.22 - 5.81 MIL/uL   Hemoglobin 11.7 (L) 13.0 - 17.0 g/dL   HCT 36.3 (L) 39.0 - 52.0 %   MCV 86.8 78.0 - 100.0 fL   MCH 28.0 26.0 - 34.0 pg   MCHC 32.2 30.0 - 36.0 g/dL   RDW 16.3 (H) 11.5 - 15.5 %   Platelets 203 150 - 400 K/uL  CBG monitoring, ED     Status: Abnormal   Collection Time: 12/25/15  6:33 PM  Result Value Ref Range   Glucose-Capillary 185 (H) 65 - 99 mg/dL  Protime-INR     Status: Abnormal   Collection Time: 12/25/15  8:34 PM  Result Value Ref Range   Prothrombin Time 16.2 (H) 11.6 - 15.2 seconds   INR 1.29 0.00 - 1.49  APTT     Status: Abnormal   Collection Time: 12/25/15  8:34 PM  Result Value Ref Range   aPTT 40 (H) 24 - 37 seconds    Comment:        IF BASELINE aPTT IS ELEVATED, SUGGEST PATIENT RISK ASSESSMENT BE USED TO DETERMINE APPROPRIATE ANTICOAGULANT THERAPY.   Glucose, capillary     Status: Abnormal   Collection Time: 12/26/15 12:21 AM  Result Value Ref Range   Glucose-Capillary 260 (H) 65 - 99 mg/dL  Comprehensive metabolic panel     Status: Abnormal   Collection Time: 12/26/15  5:18 AM  Result Value Ref Range   Sodium 136 135 - 145 mmol/L   Potassium 3.5 3.5 - 5.1 mmol/L   Chloride 103 101 - 111 mmol/L   CO2 25 22 - 32 mmol/L   Glucose, Bld 183 (H) 65 - 99 mg/dL   BUN 30 (H) 6 - 20 mg/dL   Creatinine, Ser 4.21 (H) 0.61 - 1.24 mg/dL   Calcium 7.8 (L) 8.9 - 10.3 mg/dL   Total Protein 4.0 (L) 6.5 - 8.1 g/dL   Albumin 2.2 (L) 3.5 - 5.0 g/dL   AST 14 (L) 15 - 41 U/L   ALT 13 (L) 17 - 63 U/L   Alkaline Phosphatase 89 38 - 126 U/L   Total Bilirubin 0.7 0.3 - 1.2 mg/dL   GFR calc non Af Amer 13 (L) >60 mL/min   GFR calc Af Amer 15 (L) >60 mL/min    Comment: (NOTE) The eGFR has been calculated using the CKD EPI equation. This calculation has not been validated in all clinical situations. eGFR's persistently <60 mL/min signify possible Chronic Kidney Disease.    Anion gap 8 5 - 15  CBC WITH  DIFFERENTIAL  Status: Abnormal   Collection Time: 12/26/15  5:18 AM  Result Value Ref Range   WBC 8.5 4.0 - 10.5 K/uL   RBC 3.45 (L) 4.22 - 5.81 MIL/uL   Hemoglobin 9.9 (L) 13.0 - 17.0 g/dL   HCT 29.9 (L) 39.0 - 52.0 %   MCV 86.7 78.0 - 100.0 fL   MCH 28.7 26.0 - 34.0 pg   MCHC 33.1 30.0 - 36.0 g/dL   RDW 16.5 (H) 11.5 - 15.5 %   Platelets 190 150 - 400 K/uL   Neutrophils Relative % 64 %   Neutro Abs 5.4 1.7 - 7.7 K/uL   Lymphocytes Relative 21 %   Lymphs Abs 1.8 0.7 - 4.0 K/uL   Monocytes Relative 14 %   Monocytes Absolute 1.2 (H) 0.1 - 1.0 K/uL   Eosinophils Relative 1 %   Eosinophils Absolute 0.1 0.0 - 0.7 K/uL   Basophils Relative 0 %   Basophils Absolute 0.0 0.0 - 0.1 K/uL  Glucose, capillary     Status: Abnormal   Collection Time: 12/26/15  6:34 AM  Result Value Ref Range   Glucose-Capillary 158 (H) 65 - 99 mg/dL   Comment 1 Notify RN    Comment 2 Document in Chart    Ct Head Wo Contrast  12/26/2015  CLINICAL DATA:  Subdural hematoma, concussion, lost consciousness, subsequent encounter. EXAM: CT HEAD WITHOUT CONTRAST TECHNIQUE: Contiguous axial images were obtained from the base of the skull through the vertex without intravenous contrast. 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 Otherwise, the brainstem, cerebellum, cerebral peduncles, thalami, basal ganglia, basilar cisterns, and ventricular system appear within normal limits. Periventricular white matter and corona radiata hypodensities favor chronic ischemic microvascular white matter disease. The hyperdense acute component of the bilateral subdural hematomas appears similar to both of yesterday's exams. On the left side, the low-density and intermediate density components of the extra-axial fluid on the left have increased compared to yesterday's exams, for example thickness 8 mm today compared to 5 mm previous. There is no midline shift or  herniation. Left parietal scalp hematoma noted. No acute CVA or mass lesion. I do not observe a calvarial fracture or suture diastases. IMPRESSION: 1. Although the focal acute components of the bilateral subdural hematomas are stable, in general the extra-axial space on the left side has increased compared to prior. This is primarily simple fluid density, with stable acute component of hematoma and probably some inferior subacute component of hematoma. Thickness of the extra-axial collection on the left approximately 8 mm, previously about 5 mm. No midline shift. 2. Left-sided scalp hematoma. 3. Small remote lacunar infarcts of the left lentiform nucleus. 4. Periventricular white matter and corona radiata hypodensities favor chronic ischemic microvascular white matter disease. Electronically Signed   By: Van Clines M.D.   On: 12/26/2015 07:29   Ct Head Wo Contrast  12/25/2015  CLINICAL DATA:  Posttraumatic headache and posterior neck pain after fall slipping on ice. Positive loss of consciousness. EXAM: CT HEAD WITHOUT CONTRAST CT CERVICAL SPINE WITHOUT CONTRAST TECHNIQUE: Multidetector CT imaging of the head and cervical spine was performed following the standard protocol without intravenous contrast. Multiplanar CT image reconstructions of the cervical spine were also generated. COMPARISON:  None. FINDINGS: CT HEAD FINDINGS Bony calvarium appears intact. Left posterior scalp hematoma is noted. Mild diffuse cortical atrophy is noted. Mild chronic ischemic white matter disease is noted. Small left subdural hematoma is seen over left frontal region. Similar  abnormality is seen over right frontal lobe. No mass effect or midline shift is noted. Ventricular size is within normal limits. No mass lesion or acute infarction is noted. CT CERVICAL SPINE FINDINGS No fracture is noted. Moderate degenerative disc disease is noted at C3-4, C4-5 and C5-6 with anterior and posterior osteophyte formation. Severe  degenerative disc disease is noted at C6-7 and C7-T1 with anterior and posterior osteophyte formation. Grade 1 retrolisthesis of C4-5 is noted most likely due to degenerative disc disease. Visualized lung apices are unremarkable. IMPRESSION: Left posterior scalp hematoma. Mild diffuse cortical atrophy. Mild chronic ischemic white matter disease. Small bilateral frontal subdural hematomas are noted without mass effect or midline shift. 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 No acute abnormality is noted. Critical Value/emergent results were called by telephone at the time of interpretation on 12/25/2015 at 7:27 pm to Dr. Julianne Rice , who verbally acknowledged these results. Electronically Signed   By: Marijo Conception, M.D.   On: 12/25/2015 19:30   Ct Cervical Spine Wo Contrast  12/25/2015  CLINICAL DATA:  Posttraumatic headache and posterior neck pain after fall slipping on ice. Positive loss of consciousness. EXAM: CT HEAD WITHOUT CONTRAST CT CERVICAL SPINE WITHOUT CONTRAST TECHNIQUE: Multidetector CT imaging of the head and cervical spine was performed following the standard protocol without intravenous contrast. Multiplanar CT image reconstructions of the cervical spine were also generated. COMPARISON:  None. FINDINGS: CT HEAD FINDINGS Bony calvarium appears intact. Left posterior scalp hematoma is noted. Mild diffuse cortical atrophy is noted. Mild chronic ischemic white matter disease is noted. Small left subdural hematoma is seen over left frontal region. Similar abnormality is seen over right frontal lobe. No mass effect or midline shift is noted. Ventricular size is within normal limits. No mass lesion or acute infarction is noted. CT CERVICAL SPINE FINDINGS No fracture is noted. Moderate degenerative disc disease is noted at C3-4, C4-5 and C5-6 with anterior and posterior osteophyte formation. Severe degenerative disc disease is noted at C6-7 and C7-T1  with anterior and posterior osteophyte formation. Grade 1 retrolisthesis of C4-5 is noted most likely due to degenerative disc disease. Visualized lung apices are unremarkable. IMPRESSION: Left posterior scalp hematoma. Mild diffuse cortical atrophy. Mild chronic ischemic white matter disease. Small bilateral frontal subdural hematomas are noted without mass effect or midline shift. 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 No acute abnormality is noted. Critical Value/emergent results were called by telephone at the time of interpretation on 12/25/2015 at 7:27 pm to Dr. Julianne Rice , who verbally acknowledged these results. Electronically Signed   By: Marijo Conception, M.D.   On: 12/25/2015 19:30    PMH:   Past Medical History  Diagnosis Date  . Renal disorder   . Hypertension   . Diabetes mellitus without complication ([place])   . Coronary artery disease     PSH:  History reviewed. No pertinent past surgical history.  Allergies:  Allergies  Allergen Reactions  . Ampicillin     Ankle swelling  . Benadryl [Diphenhydramine Hcl]     Rash    Medications:   Prior to Admission medications   Medication Sig Start Date End Date Taking? Authorizing Provider  FLUDROCORTISONE ACETATE PO Take 0.1 mg by mouth daily.   Yes Historical Provider, MD  insulin aspart (NOVOLOG) 100 UNIT/ML injection Inject 2-6 Units into the skin 3 (three) times daily before meals. Per sliding scale  Yes Historical Provider, MD  vitamin B-12 (CYANOCOBALAMIN) 1000 MCG tablet Take 1,000 mcg by mouth daily.   Yes Historical Provider, MD    Discontinued Meds:  There are no discontinued medications.  Social History:  reports that he has never smoked. He does not have any smokeless tobacco history on file. He reports that he does not drink alcohol or use illicit drugs.  Family History:  History reviewed. No pertinent family history.  Blood pressure 158/77, pulse 89,  temperature 98 F (36.7 C), temperature source Oral, resp. rate 18, SpO2 98 %. General appearance: alert and appears older than stated age Head: right posterior laceration Eyes: negative Neck: no adenopathy, no carotid bruit, no JVD, supple, symmetrical, trachea midline and thyroid not enlarged, symmetric, no tenderness/mass/nodules Back: symmetric, no curvature. ROM normal. No CVA tenderness. Resp: clear to auscultation bilaterally Chest wall: no tenderness Cardio: regular rate and rhythm, S1, S2 normal, no murmur, click, rub or gallop GI: soft, non-tender; bowel sounds normal; no masses,  no organomegaly Extremities: extremities normal, atraumatic, no cyanosis or edema Pulses: 2+ and symmetric Skin: Skin color, texture, turgor normal. No rashes or lesions Lymph nodes: Cervical, supraclavicular, and axillary nodes normal. Neurologic: Grossly normal  Seen on dialysis at 155pm. HD thru [facility] TC. 3k/2.5Ca BP 151/69 UF 2.5L --> he's supposedly below his EDW according to the pt. He's also still voiding. No dyspnea.     Dwana Melena, MD 12/26/2015, 10:29 AM

## 2015-12-26 NOTE — Progress Notes (Signed)
PT Cancellation Note  Patient Details Name: Jeffrey Walls MRN: [id number] DOB: Jul 22, 1941   Cancelled Treatment:    Reason Eval/Treat Not Completed: Patient at procedure or test/unavailable; attempted to see pt.  He was out of the room.  Confirmed with NS pt at HD.  Will attempt later as time permits.   Elray McgregorCynthia Fantasia Jinkins 12/26/2015, 2:05 PM [name], PT [phone] 12/26/2015

## 2015-12-26 NOTE — Discharge Summary (Signed)
Physician Discharge Summary  Jeffrey Walls MRN: [id number] DOB/AGE: 1941-07-01 75 y.o.  PCP: Elwyn Lade, MD   Admit date: 12/25/2015 Discharge date: 12/26/2015  Discharge Diagnoses:     Principal Problem:   Subdural hematoma caused by concussion [facility]) Active Problems:   Hypertension   ESRD on hemodialysis ([place])   Diabetes mellitus, type 2 ([place])   Anemia of renal disease    Follow-up recommendations Follow-up with PCP in 3-5 days , including all  additional recommended appointments as below Follow-up CBC, CMP in 3-5 days      Medication List    TAKE these medications        FLUDROCORTISONE ACETATE PO  Take 0.1 mg by mouth daily.     insulin aspart 100 UNIT/ML injection  Commonly known as:  novoLOG  Inject 2-6 Units into the skin 3 (three) times daily before meals. Per sliding scale     vitamin B-12 1000 MCG tablet  Commonly known as:  CYANOCOBALAMIN  Take 1,000 mcg by mouth daily.         Discharge Condition:    Discharge Instructions       Discharge Instructions    Diet - low sodium heart healthy    Complete by:  As directed      Increase activity slowly    Complete by:  As directed            Allergies  Allergen Reactions  . Ampicillin     Ankle swelling  . Benadryl [Diphenhydramine Hcl]     Rash      Disposition: Final discharge disposition not confirmed   Consults:  Nephrology Neurosurgery     Significant Diagnostic Studies:  Ct Head Wo Contrast  12/26/2015  CLINICAL DATA:  Subdural hematoma, concussion, lost consciousness, subsequent encounter. EXAM: CT HEAD WITHOUT CONTRAST TECHNIQUE: Contiguous axial images were obtained from the base of the skull through the vertex without intravenous contrast. 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 Otherwise, the brainstem, cerebellum, cerebral peduncles, thalami, basal ganglia, basilar cisterns, and  ventricular system appear within normal limits. Periventricular white matter and corona radiata hypodensities favor chronic ischemic microvascular white matter disease. The hyperdense acute component of the bilateral subdural hematomas appears similar to both of yesterday's exams. On the left side, the low-density and intermediate density components of the extra-axial fluid on the left have increased compared to yesterday's exams, for example thickness 8 mm today compared to 5 mm previous. There is no midline shift or herniation. Left parietal scalp hematoma noted. No acute CVA or mass lesion. I do not observe a calvarial fracture or suture diastases. IMPRESSION: 1. Although the focal acute components of the bilateral subdural hematomas are stable, in general the extra-axial space on the left side has increased compared to prior. This is primarily simple fluid density, with stable acute component of hematoma and probably some inferior subacute component of hematoma. Thickness of the extra-axial collection on the left approximately 8 mm, previously about 5 mm. No midline shift. 2. Left-sided scalp hematoma. 3. Small remote lacunar infarcts of the left lentiform nucleus. 4. Periventricular white matter and corona radiata hypodensities favor chronic ischemic microvascular white matter disease. Electronically Signed   By: Van Clines M.D.   On: 12/26/2015 07:29   Ct Head Wo Contrast  12/25/2015  CLINICAL DATA:  Posttraumatic headache and posterior neck pain after fall slipping on ice. Positive loss of consciousness. EXAM: CT HEAD WITHOUT CONTRAST CT CERVICAL SPINE WITHOUT  CONTRAST TECHNIQUE: Multidetector CT imaging of the head and cervical spine was performed following the standard protocol without intravenous contrast. Multiplanar CT image reconstructions of the cervical spine were also generated. COMPARISON:  None. FINDINGS: CT HEAD FINDINGS Bony calvarium appears intact. Left posterior scalp hematoma is  noted. Mild diffuse cortical atrophy is noted. Mild chronic ischemic white matter disease is noted. Small left subdural hematoma is seen over left frontal region. Similar abnormality is seen over right frontal lobe. No mass effect or midline shift is noted. Ventricular size is within normal limits. No mass lesion or acute infarction is noted. CT CERVICAL SPINE FINDINGS No fracture is noted. Moderate degenerative disc disease is noted at C3-4, C4-5 and C5-6 with anterior and posterior osteophyte formation. Severe degenerative disc disease is noted at C6-7 and C7-T1 with anterior and posterior osteophyte formation. Grade 1 retrolisthesis of C4-5 is noted most likely due to degenerative disc disease. Visualized lung apices are unremarkable. IMPRESSION: Left posterior scalp hematoma. Mild diffuse cortical atrophy. Mild chronic ischemic white matter disease. Small bilateral frontal subdural hematomas are noted without mass effect or midline shift. 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 No acute abnormality is noted. Critical Value/emergent results were called by telephone at the time of interpretation on 12/25/2015 at 7:27 pm to Dr. Julianne Rice , who verbally acknowledged these results. Electronically Signed   By: Marijo Conception, M.D.   On: 12/25/2015 19:30   Ct Cervical Spine Wo Contrast  12/25/2015  CLINICAL DATA:  Posttraumatic headache and posterior neck pain after fall slipping on ice. Positive loss of consciousness. EXAM: CT HEAD WITHOUT CONTRAST CT CERVICAL SPINE WITHOUT CONTRAST TECHNIQUE: Multidetector CT imaging of the head and cervical spine was performed following the standard protocol without intravenous contrast. Multiplanar CT image reconstructions of the cervical spine were also generated. COMPARISON:  None. FINDINGS: CT HEAD FINDINGS Bony calvarium appears intact. Left posterior scalp hematoma is noted. Mild diffuse cortical atrophy is noted. Mild  chronic ischemic white matter disease is noted. Small left subdural hematoma is seen over left frontal region. Similar abnormality is seen over right frontal lobe. No mass effect or midline shift is noted. Ventricular size is within normal limits. No mass lesion or acute infarction is noted. CT CERVICAL SPINE FINDINGS No fracture is noted. Moderate degenerative disc disease is noted at C3-4, C4-5 and C5-6 with anterior and posterior osteophyte formation. Severe degenerative disc disease is noted at C6-7 and C7-T1 with anterior and posterior osteophyte formation. Grade 1 retrolisthesis of C4-5 is noted most likely due to degenerative disc disease. Visualized lung apices are unremarkable. IMPRESSION: Left posterior scalp hematoma. Mild diffuse cortical atrophy. Mild chronic ischemic white matter disease. Small bilateral frontal subdural hematomas are noted without mass effect or midline shift. 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 No acute abnormality is noted. Critical Value/emergent results were called by telephone at the time of interpretation on 12/25/2015 at 7:27 pm to Dr. Julianne Rice , who verbally acknowledged these results. Electronically Signed   By: Marijo Conception, M.D.   On: 12/25/2015 19:30        There were no vitals filed for this visit.   Microbiology: No results found for this or any previous visit (from the past 240 hour(s)).     Blood Culture No results found for: SDES, SPECREQUEST, CULT, REPTSTATUS    Labs: Results for orders placed or performed during the hospital encounter of  12/25/15 (from the past 48 hour(s))  Comprehensive metabolic panel     Status: Abnormal   Collection Time: 12/25/15  2:12 PM  Result Value Ref Range   Sodium 136 135 - 145 mmol/L   Potassium 4.0 3.5 - 5.1 mmol/L   Chloride 100 (L) 101 - 111 mmol/L   CO2 27 22 - 32 mmol/L   Glucose, Bld 257 (H) 65 - 99 mg/dL   BUN 28 (H) 6 - 20 mg/dL    Creatinine, Ser 4.41 (H) 0.61 - 1.24 mg/dL   Calcium 8.3 (L) 8.9 - 10.3 mg/dL   Total Protein 5.3 (L) 6.5 - 8.1 g/dL   Albumin 2.7 (L) 3.5 - 5.0 g/dL   AST 19 15 - 41 U/L   ALT 20 17 - 63 U/L   Alkaline Phosphatase 109 38 - 126 U/L   Total Bilirubin 0.8 0.3 - 1.2 mg/dL   GFR calc non Af Amer 12 (L) >60 mL/min   GFR calc Af Amer 14 (L) >60 mL/min    Comment: (NOTE) The eGFR has been calculated using the CKD EPI equation. This calculation has not been validated in all clinical situations. eGFR's persistently <60 mL/min signify possible Chronic Kidney Disease.    Anion gap 9 5 - 15  CBC     Status: Abnormal   Collection Time: 12/25/15  2:12 PM  Result Value Ref Range   WBC 10.3 4.0 - 10.5 K/uL   RBC 4.18 (L) 4.22 - 5.81 MIL/uL   Hemoglobin 11.7 (L) 13.0 - 17.0 g/dL   HCT 36.3 (L) 39.0 - 52.0 %   MCV 86.8 78.0 - 100.0 fL   MCH 28.0 26.0 - 34.0 pg   MCHC 32.2 30.0 - 36.0 g/dL   RDW 16.3 (H) 11.5 - 15.5 %   Platelets 203 150 - 400 K/uL  CBG monitoring, ED     Status: Abnormal   Collection Time: 12/25/15  6:33 PM  Result Value Ref Range   Glucose-Capillary 185 (H) 65 - 99 mg/dL  Protime-INR     Status: Abnormal   Collection Time: 12/25/15  8:34 PM  Result Value Ref Range   Prothrombin Time 16.2 (H) 11.6 - 15.2 seconds   INR 1.29 0.00 - 1.49  APTT     Status: Abnormal   Collection Time: 12/25/15  8:34 PM  Result Value Ref Range   aPTT 40 (H) 24 - 37 seconds    Comment:        IF BASELINE aPTT IS ELEVATED, SUGGEST PATIENT RISK ASSESSMENT BE USED TO DETERMINE APPROPRIATE ANTICOAGULANT THERAPY.   Glucose, capillary     Status: Abnormal   Collection Time: 12/26/15 12:21 AM  Result Value Ref Range   Glucose-Capillary 260 (H) 65 - 99 mg/dL  Comprehensive metabolic panel     Status: Abnormal   Collection Time: 12/26/15  5:18 AM  Result Value Ref Range   Sodium 136 135 - 145 mmol/L   Potassium 3.5 3.5 - 5.1 mmol/L   Chloride 103 101 - 111 mmol/L   CO2 25 22 - 32 mmol/L    Glucose, Bld 183 (H) 65 - 99 mg/dL   BUN 30 (H) 6 - 20 mg/dL   Creatinine, Ser 4.21 (H) 0.61 - 1.24 mg/dL   Calcium 7.8 (L) 8.9 - 10.3 mg/dL   Total Protein 4.0 (L) 6.5 - 8.1 g/dL   Albumin 2.2 (L) 3.5 - 5.0 g/dL   AST 14 (L) 15 - 41 U/L   ALT 13 (L)  17 - 63 U/L   Alkaline Phosphatase 89 38 - 126 U/L   Total Bilirubin 0.7 0.3 - 1.2 mg/dL   GFR calc non Af Amer 13 (L) >60 mL/min   GFR calc Af Amer 15 (L) >60 mL/min    Comment: (NOTE) The eGFR has been calculated using the CKD EPI equation. This calculation has not been validated in all clinical situations. eGFR's persistently <60 mL/min signify possible Chronic Kidney Disease.    Anion gap 8 5 - 15  CBC WITH DIFFERENTIAL     Status: Abnormal   Collection Time: 12/26/15  5:18 AM  Result Value Ref Range   WBC 8.5 4.0 - 10.5 K/uL   RBC 3.45 (L) 4.22 - 5.81 MIL/uL   Hemoglobin 9.9 (L) 13.0 - 17.0 g/dL   HCT 29.9 (L) 39.0 - 52.0 %   MCV 86.7 78.0 - 100.0 fL   MCH 28.7 26.0 - 34.0 pg   MCHC 33.1 30.0 - 36.0 g/dL   RDW 16.5 (H) 11.5 - 15.5 %   Platelets 190 150 - 400 K/uL   Neutrophils Relative % 64 %   Neutro Abs 5.4 1.7 - 7.7 K/uL   Lymphocytes Relative 21 %   Lymphs Abs 1.8 0.7 - 4.0 K/uL   Monocytes Relative 14 %   Monocytes Absolute 1.2 (H) 0.1 - 1.0 K/uL   Eosinophils Relative 1 %   Eosinophils Absolute 0.1 0.0 - 0.7 K/uL   Basophils Relative 0 %   Basophils Absolute 0.0 0.0 - 0.1 K/uL  Glucose, capillary     Status: Abnormal   Collection Time: 12/26/15  6:34 AM  Result Value Ref Range   Glucose-Capillary 158 (H) 65 - 99 mg/dL   Comment 1 Notify RN    Comment 2 Document in Chart   Glucose, capillary     Status: Abnormal   Collection Time: 12/26/15 10:56 AM  Result Value Ref Range   Glucose-Capillary 205 (H) 65 - 99 mg/dL     Lipid Panel  No results found for: CHOL, TRIG, HDL, CHOLHDL, VLDL, LDLCALC, LDLDIRECT   No results found for: HGBA1C   Lab Results  Component Value Date   CREATININE 4.21* 12/26/2015      HPI :75 y.o. 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 He also carries the diagnosis of Walking Corpse Syndrome and interestingly thinks this is all acute with renal recovery still possible. His last HD treatment was in [place] on Thursday, missed HD Sat secondary to the snow and sustained a fall while walking out of the house yesterday. He doesn't remember the fall and was told to go to the ED where a CT diagnosed a subdural hematoma. He currently denies any worsening of pain in the traumatized area, also denies diplopia, dizziness, headaches or nausea. He also states that he has good UOP with no dyspnea   HOSPITAL COURSE:  Subdural hematoma caused by concussion [facility]) Patient admitted for observation Frequent neuro checks stable. 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 Pharmacy to get a prescription information from his local [facility].     ESRD on hemodialysis [facility]) continue hemodialysis as per nephrology. This 5 days of dialysis, Patient to discharge home after  dialysis today   Diabetes mellitus, type 2 ([facility]) CBG monitoring with regular insulin sliding scale.  hemoglobin A1c pending.   Anemia of renal disease Monitor hematocrit and hemoglobin periodically. Erythropoietin supplementation per nephrology as needed.  Neurosurgery was consulted by the emergency department. (Dr. Kathyrn Sheriff) Dr. Joelyn Oms was consulted for nephrology evaluation in the morning.   Discharge Exam: *   Blood pressure 158/77, pulse 89, temperature 98 F (36.7 C), temperature source Oral, resp. rate 18, SpO2 98 %.   General: Appears calm and  comfortable  Head: Occipital laceration with dressing in place.  Eyes: Preexisting anisocoria, right pupil > left pupil normal lids, irises & conjunctiva  ENT: grossly normal hearing, lips & tongue  Neck: no LAD, masses or thyromegaly  Cardiovascular: RRR, no m/r/g. No LE edema.  Telemetry: SR, no arrhythmias   Respiratory: CTA bilaterally, no w/r/r. Normal respiratory effort.  Abdomen: soft, ntnd  Skin: no rash or induration seen on limited exam  Musculoskeletal: grossly normal tone BUE/BLE  Psychiatric: grossly normal mood and affect, speech fluent and appropriate  Neurologic: grossly non-focal.           Follow-up Information    Follow up with [facility], MD. Schedule an appointment as soon as possible for a visit in 3 days.   Specialty:  Internal Medicine   Contact information:   [address] [place] [address] [phone]       Follow up with Consuella Lose, C, MD. Schedule an appointment as soon as possible for a visit in 2 weeks.   Specialty:  Neurosurgery   Why:  Follow-up of subdural hematoma   Contact information:   [address] [place] [address] [phone]       Signed: Reyne Dumas 12/26/2015, 12:03 PM        Time spent >45 mins

## 2015-12-27 LAB — GLUCOSE, CAPILLARY
GLUCOSE-CAPILLARY: 129 mg/dL — AB (ref 65–99)
Glucose-Capillary: 187 mg/dL — ABNORMAL HIGH (ref 65–99)

## 2015-12-27 LAB — HEMOGLOBIN A1C
HEMOGLOBIN A1C: 7.4 % — AB (ref 4.8–5.6)
MEAN PLASMA GLUCOSE: 166 mg/dL

## 2015-12-27 LAB — PHOSPHORUS: PHOSPHORUS: 2.8 mg/dL (ref 2.5–4.6)

## 2015-12-27 NOTE — Progress Notes (Signed)
Occupational Therapy Evaluation Patient Details Name: Jeffrey Walls MRN: [id number] DOB: 1941/07/04 Today's Date: 12/27/2015    History of Present Illness 75 y.o. male admitted through the ED after suffering a fall while walking out to his car to go to dialysis.CT -Small bilateral frontal subdural hematomas.   Clinical Impression   PTA, pt lived alone and was mod I with ADL and mobility at cane level as needed. Pt had recently been [facility] after a prolonged hospitalization. Pt drives himself to dialysis and does his own IADL tasks. Pt appears close to his baseline regarding mobility and ADL and is safe to D/C home when medically stable. Pt voicing concerns about how he is going to get home from the hospital and is concerned about the expense. SW contacted and is speaking with pt now. OT signing off.     Follow Up Recommendations  No OT follow up    Equipment Recommendations  None recommended by OT    Recommendations for Other Services       Precautions / Restrictions Precautions Precautions: Fall      Mobility Bed Mobility Overal bed mobility: Independent                Transfers Overall transfer level: Modified independent               General transfer comment: Minimal unsteadiness. Pt states he only slept 3 hrs last night and he is "feeling it"    Balance Overall balance assessment: History of Falls;No apparent balance deficits (not formally assessed) (on ice; uses cane for stability)Feel balance is close to baseline. At baseline, pt is very active. Pt is ambulating independently in room.                                           ADL Overall ADL's : At baseline                                             Vision Vision Assessment?: No apparent visual deficits   Perception     Praxis Praxis Praxis tested?: Within functional limits    Pertinent Vitals/Pain Pain Assessment:  No/denies pain     Hand Dominance Right   Extremity/Trunk Assessment Upper Extremity Assessment Upper Extremity Assessment: Overall WFL for tasks assessed   Lower Extremity Assessment Lower Extremity Assessment: Overall WFL for tasks assessed   Cervical / Trunk Assessment Cervical / Trunk Assessment: Normal   Communication Communication Communication: No difficulties   Cognition Arousal/Alertness: Awake/alert Behavior During Therapy: WFL for tasks assessed/performed Overall Cognitive Status: Within Functional Limits for tasks assessed  Good safety awareness Pt able to give information about his medications and when he takes them. He is also able to discuss his dialysis schedule/process in great detail.                                        Home Living Family/patient expects to be discharged to:: Private residence Living Arrangements: Alone   Type of Home: House Home Access: Stairs to enter Entergy CorporationEntrance Stairs-Number of Steps: 2 Entrance Stairs-Rails: None (sits on stool to helpgo up step) Home Layout: One level  Bathroom Shower/Tub: Producer, television/film/video: Standard Bathroom Accessibility: Yes How Accessible: Accessible via wheelchair;Accessible via walker Home Equipment: Walker - 2 wheels;Shower seat;Cane - single point          Prior Functioning/Environment Level of Independence: Independent;Independent with assistive device(s)        Comments: has been using a cane for extra stability. drives self to dialysis. discharged from [facility] beginning of December.     OT Diagnosis: Generalized weakness   OT Problem List: Decreased activity tolerance   OT Treatment/Interventions:      OT Goals(Current goals can be found in the care plan section) Acute Rehab OT Goals Patient Stated Goal: to go home OT Goal Formulation: All assessment and education complete, DC therapy  OT Frequency:     Barriers to D/C:             Co-evaluation              End of Session Nurse Communication: Mobility status  Activity Tolerance: Patient tolerated treatment well Patient left: in bed;with call bell/phone within reach   Time: 1610-9604 OT Time Calculation (min): 30 min Charges:  OT General Charges $OT Visit: 1 Procedure OT Evaluation $OT Eval Low Complexity: 1 Procedure OT Treatments $Self Care/Home Management : 8-22 mins G-Codes: OT G-codes **NOT FOR INPATIENT CLASS** Functional Assessment Tool Used: clinical judgement Functional Limitation: Self care Self Care Current Status (V4098): At least 1 percent but less than 20 percent impaired, limited or restricted Self Care Goal Status (J1914): At least 1 percent but less than 20 percent impaired, limited or restricted Self Care Discharge Status [phone]): At least 1 percent but less than 20 percent impaired, limited or restricted  [facility] 12/27/2015, 10:50 AM   [facility], OTR/L  [phone] 12/27/2015

## 2015-12-27 NOTE — Progress Notes (Signed)
[facility] Progress Note    Assessment/ Plan:   1. SDH - developed after a fall while going to dialysis. Repeat CT on 12/26/2015 - left side increase in size/depth but clinically pt doing well. No complaints of increased pain, alert and cooperative + appropriate. 2. 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 - Last dialysis previous Thur. - Seen on  HD yesterday --> he is going home today. He will dialyze on Sat in [place]. - Patient interesting enough thinks that this is all acute and that he may still recover renal function. I spent considerable time educating and counseling him.  - Appreciate [facility] and Dr. Arbie Cookey seeing him and hopefully he will show up for his appt to place an access. 3. 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 iPTH management as outpt. 5. Hypertension - adequate control. 6. DM 7. Walking Corpse Syndrome  Subjective:   Feeling much better. Denies f/c/n/v. Mild dyspnea but no cough.   Objective:   BP 157/93 mmHg  Pulse 88  Temp(Src) 98.1 F (36.7 C) (Oral)  Resp 20  Wt 60 kg (132 lb 4.4 oz)  SpO2 98%  Intake/Output Summary (Last 24 hours) at 12/27/15 1122 Last data filed at 12/27/15 1020  Gross per 24 hour  Intake    240 ml  Output      0 ml  Net    240 ml   Weight change:   Physical Exam: General appearance: alert and appears older than stated age Back: symmetric, no curvature. ROM normal. No CVA tenderness. Resp: clear to auscultation bilaterally Chest wall: no tenderness Cardio: regular rate and rhythm, S1, S2 normal, no murmur, click, rub or gallop GI: soft, non-tender; bowel sounds normal; no masses, no organomegaly Extremities: extremities normal, atraumatic, no cyanosis or edema Pulses: 2+ and symmetric  Imaging: Ct Head Wo Contrast  12/26/2015  CLINICAL DATA:  Subdural hematoma,  concussion, lost consciousness, subsequent encounter. EXAM: CT HEAD WITHOUT CONTRAST TECHNIQUE: Contiguous axial images were obtained from the base of the skull through the vertex without intravenous contrast. 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 Otherwise, the brainstem, cerebellum, cerebral peduncles, thalami, basal ganglia, basilar cisterns, and ventricular system appear within normal limits. Periventricular white matter and corona radiata hypodensities favor chronic ischemic microvascular white matter disease. The hyperdense acute component of the bilateral subdural hematomas appears similar to both of yesterday's exams. On the left side, the low-density and intermediate density components of the extra-axial fluid on the left have increased compared to yesterday's exams, for example thickness 8 mm today compared to 5 mm previous. There is no midline shift or herniation. Left parietal scalp hematoma noted. No acute CVA or mass lesion. I do not observe a calvarial fracture or suture diastases. IMPRESSION: 1. Although the focal acute components of the bilateral subdural hematomas are stable, in general the extra-axial space on the left side has increased compared to prior. This is primarily simple fluid density, with stable acute component of hematoma and probably some inferior subacute component of hematoma. Thickness of the extra-axial collection on the left approximately 8 mm, previously about 5 mm. No midline shift. 2. Left-sided scalp hematoma. 3. Small remote lacunar infarcts of the left lentiform nucleus. 4. Periventricular white matter and corona radiata hypodensities favor chronic ischemic microvascular white matter disease. Electronically Signed  By: Gaylyn Rong M.D.   On: 12/26/2015 07:29   Ct Head Wo Contrast  12/25/2015  CLINICAL DATA:  Posttraumatic headache and posterior neck pain after fall slipping on ice.  Positive loss of consciousness. EXAM: CT HEAD WITHOUT CONTRAST CT CERVICAL SPINE WITHOUT CONTRAST TECHNIQUE: Multidetector CT imaging of the head and cervical spine was performed following the standard protocol without intravenous contrast. Multiplanar CT image reconstructions of the cervical spine were also generated. COMPARISON:  None. FINDINGS: CT HEAD FINDINGS Bony calvarium appears intact. Left posterior scalp hematoma is noted. Mild diffuse cortical atrophy is noted. Mild chronic ischemic white matter disease is noted. Small left subdural hematoma is seen over left frontal region. Similar abnormality is seen over right frontal lobe. No mass effect or midline shift is noted. Ventricular size is within normal limits. No mass lesion or acute infarction is noted. CT CERVICAL SPINE FINDINGS No fracture is noted. Moderate degenerative disc disease is noted at C3-4, C4-5 and C5-6 with anterior and posterior osteophyte formation. Severe degenerative disc disease is noted at C6-7 and C7-T1 with anterior and posterior osteophyte formation. Grade 1 retrolisthesis of C4-5 is noted most likely due to degenerative disc disease. Visualized lung apices are unremarkable. IMPRESSION: Left posterior scalp hematoma. Mild diffuse cortical atrophy. Mild chronic ischemic white matter disease. Small bilateral frontal subdural hematomas are noted without mass effect or midline shift. 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 No acute abnormality is noted. Critical Value/emergent results were called by telephone at the time of interpretation on 12/25/2015 at 7:27 pm to Dr. Loren Racer , who verbally acknowledged these results. Electronically Signed   By: Lupita Raider, M.D.   On: 12/25/2015 19:30   Ct Cervical Spine Wo Contrast  12/25/2015  CLINICAL DATA:  Posttraumatic headache and posterior neck pain after fall slipping on ice. Positive loss of consciousness. EXAM: CT HEAD WITHOUT  CONTRAST CT CERVICAL SPINE WITHOUT CONTRAST TECHNIQUE: Multidetector CT imaging of the head and cervical spine was performed following the standard protocol without intravenous contrast. Multiplanar CT image reconstructions of the cervical spine were also generated. COMPARISON:  None. FINDINGS: CT HEAD FINDINGS Bony calvarium appears intact. Left posterior scalp hematoma is noted. Mild diffuse cortical atrophy is noted. Mild chronic ischemic white matter disease is noted. Small left subdural hematoma is seen over left frontal region. Similar abnormality is seen over right frontal lobe. No mass effect or midline shift is noted. Ventricular size is within normal limits. No mass lesion or acute infarction is noted. CT CERVICAL SPINE FINDINGS No fracture is noted. Moderate degenerative disc disease is noted at C3-4, C4-5 and C5-6 with anterior and posterior osteophyte formation. Severe degenerative disc disease is noted at C6-7 and C7-T1 with anterior and posterior osteophyte formation. Grade 1 retrolisthesis of C4-5 is noted most likely due to degenerative disc disease. Visualized lung apices are unremarkable. IMPRESSION: Left posterior scalp hematoma. Mild diffuse cortical atrophy. Mild chronic ischemic white matter disease. Small bilateral frontal subdural hematomas are noted without mass effect or midline shift. 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 No acute abnormality is noted. Critical Value/emergent results were called by telephone at the time of interpretation on 12/25/2015 at 7:27 pm to Dr. Loren Racer , who verbally acknowledged these results. Electronically Signed   By: Lupita Raider, M.D.   On: 12/25/2015 19:30    Labs: BMET  Recent Labs Lab 12/25/15 1412 12/26/15 0518 12/27/15 1610  NA 136 136  --   K 4.0 3.5  --   CL 100* 103  --   CO2 27 25  --   GLUCOSE 257* 183*  --   BUN 28* 30*  --   CREATININE 4.41* 4.21*  --   CALCIUM 8.3*  7.8*  --   PHOS  --   --  2.8   CBC  Recent Labs Lab 12/25/15 1412 12/26/15 0518  WBC 10.3 8.5  NEUTROABS  --  5.4  HGB 11.7* 9.9*  HCT 36.3* 29.9*  MCV 86.8 86.7  PLT 203 190    Medications:    . fludrocortisone  0.1 mg Oral Daily  . insulin aspart  0-9 Units Subcutaneous TID WC  . sodium chloride  3 mL Intravenous Q12H  . sodium chloride  3 mL Intravenous Q12H  . vitamin B-12  1,000 mcg Oral Daily      Paulene FloorJames Iveth Heidemann, MD 12/27/2015, 11:22 AM

## 2015-12-27 NOTE — Care Management Obs Status (Signed)
MEDICARE OBSERVATION STATUS NOTIFICATION   Patient Details  Name: Jeffrey Walls MRN: [id number] Date of Birth: 1941/03/20   Medicare Observation Status Notification Given:  Yes    Anda KraftRobarge, Takeshia Wenk C, RN 12/27/2015, 1:35 PM

## 2015-12-27 NOTE — Progress Notes (Signed)
Patient with discharge order. No transport available and unable to afford EMS per pt. SW is currently working on his transportation. Awaiting for call back from SW.   [place]astings[place], RN

## 2015-12-27 NOTE — Progress Notes (Signed)
Discharge instruction reviewed with patient. All questions answered at this time. Cab voucher received. Will be transport home via taxi.   Sim Boast, RN

## 2015-12-27 NOTE — Discharge Summary (Signed)
Physician Discharge Summary  Jeffrey Brandenberger MRN: [id number] DOB/AGE: Nov 20, 1941 75 y.o.  PCP: Elwyn Lade, MD   Admit date: 12/25/2015 Discharge date: 12/27/2015  Discharge Diagnoses:     Principal Problem:   Subdural hematoma caused by concussion [facility]) Active Problems:   Hypertension   ESRD on hemodialysis ([place])   Diabetes mellitus, type 2 ([place])   Anemia of renal disease    Follow-up recommendations Follow-up with PCP in 3-5 days , including all  additional recommended appointments as below Follow-up CBC, CMP in 3-5 days Follow-up with Dr. Rosetta Posner, MD, for left brachiocephalic AVF     Medication List    TAKE these medications        FLUDROCORTISONE ACETATE PO  Take 0.1 mg by mouth daily.     insulin aspart 100 UNIT/ML injection  Commonly known as:  novoLOG  Inject 2-6 Units into the skin 3 (three) times daily before meals. Per sliding scale     vitamin B-12 1000 MCG tablet  Commonly known as:  CYANOCOBALAMIN  Take 1,000 mcg by mouth daily.         Discharge Condition:    Discharge Instructions         Disposition: Final discharge disposition not confirmed   Consults:  Nephrology Neurosurgery Vascular surgery     Significant Diagnostic Studies:  Ct Head Wo Contrast  12/26/2015  CLINICAL DATA:  Subdural hematoma, concussion, lost consciousness, subsequent encounter. EXAM: CT HEAD WITHOUT CONTRAST TECHNIQUE: Contiguous axial images were obtained from the base of the skull through the vertex without intravenous contrast. 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 Otherwise, the brainstem, cerebellum, cerebral peduncles, thalami, basal ganglia, basilar cisterns, and ventricular system appear within normal limits. Periventricular white matter and corona radiata hypodensities favor chronic ischemic microvascular white matter disease. The hyperdense acute component of the  bilateral subdural hematomas appears similar to both of yesterday's exams. On the left side, the low-density and intermediate density components of the extra-axial fluid on the left have increased compared to yesterday's exams, for example thickness 8 mm today compared to 5 mm previous. There is no midline shift or herniation. Left parietal scalp hematoma noted. No acute CVA or mass lesion. I do not observe a calvarial fracture or suture diastases. IMPRESSION: 1. Although the focal acute components of the bilateral subdural hematomas are stable, in general the extra-axial space on the left side has increased compared to prior. This is primarily simple fluid density, with stable acute component of hematoma and probably some inferior subacute component of hematoma. Thickness of the extra-axial collection on the left approximately 8 mm, previously about 5 mm. No midline shift. 2. Left-sided scalp hematoma. 3. Small remote lacunar infarcts of the left lentiform nucleus. 4. Periventricular white matter and corona radiata hypodensities favor chronic ischemic microvascular white matter disease. Electronically Signed   By: Van Clines M.D.   On: 12/26/2015 07:29   Ct Head Wo Contrast  12/25/2015  CLINICAL DATA:  Posttraumatic headache and posterior neck pain after fall slipping on ice. Positive loss of consciousness. EXAM: CT HEAD WITHOUT CONTRAST CT CERVICAL SPINE WITHOUT CONTRAST TECHNIQUE: Multidetector CT imaging of the head and cervical spine was performed following the standard protocol without intravenous contrast. Multiplanar CT image reconstructions of the cervical spine were also generated. COMPARISON:  None. FINDINGS: CT HEAD FINDINGS Bony calvarium appears intact. Left posterior scalp hematoma is noted. Mild diffuse cortical atrophy is noted. Mild chronic ischemic white matter disease  is noted. Small left subdural hematoma is seen over left frontal region. Similar abnormality is seen over right frontal  lobe. No mass effect or midline shift is noted. Ventricular size is within normal limits. No mass lesion or acute infarction is noted. CT CERVICAL SPINE FINDINGS No fracture is noted. Moderate degenerative disc disease is noted at C3-4, C4-5 and C5-6 with anterior and posterior osteophyte formation. Severe degenerative disc disease is noted at C6-7 and C7-T1 with anterior and posterior osteophyte formation. Grade 1 retrolisthesis of C4-5 is noted most likely due to degenerative disc disease. Visualized lung apices are unremarkable. IMPRESSION: Left posterior scalp hematoma. Mild diffuse cortical atrophy. Mild chronic ischemic white matter disease. Small bilateral frontal subdural hematomas are noted without mass effect or midline shift. 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 No acute abnormality is noted. Critical Value/emergent results were called by telephone at the time of interpretation on 12/25/2015 at 7:27 pm to Dr. Julianne Rice , who verbally acknowledged these results. Electronically Signed   By: Marijo Conception, M.D.   On: 12/25/2015 19:30   Ct Cervical Spine Wo Contrast  12/25/2015  CLINICAL DATA:  Posttraumatic headache and posterior neck pain after fall slipping on ice. Positive loss of consciousness. EXAM: CT HEAD WITHOUT CONTRAST CT CERVICAL SPINE WITHOUT CONTRAST TECHNIQUE: Multidetector CT imaging of the head and cervical spine was performed following the standard protocol without intravenous contrast. Multiplanar CT image reconstructions of the cervical spine were also generated. COMPARISON:  None. FINDINGS: CT HEAD FINDINGS Bony calvarium appears intact. Left posterior scalp hematoma is noted. Mild diffuse cortical atrophy is noted. Mild chronic ischemic white matter disease is noted. Small left subdural hematoma is seen over left frontal region. Similar abnormality is seen over right frontal lobe. No mass effect or midline shift is noted.  Ventricular size is within normal limits. No mass lesion or acute infarction is noted. CT CERVICAL SPINE FINDINGS No fracture is noted. Moderate degenerative disc disease is noted at C3-4, C4-5 and C5-6 with anterior and posterior osteophyte formation. Severe degenerative disc disease is noted at C6-7 and C7-T1 with anterior and posterior osteophyte formation. Grade 1 retrolisthesis of C4-5 is noted most likely due to degenerative disc disease. Visualized lung apices are unremarkable. IMPRESSION: Left posterior scalp hematoma. Mild diffuse cortical atrophy. Mild chronic ischemic white matter disease. Small bilateral frontal subdural hematomas are noted without mass effect or midline shift. 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 No acute abnormality is noted. Critical Value/emergent results were called by telephone at the time of interpretation on 12/25/2015 at 7:27 pm to Dr. Julianne Rice , who verbally acknowledged these results. Electronically Signed   By: Marijo Conception, M.D.   On: 12/25/2015 19:30        Filed Weights   12/26/15 1319 12/26/15 1726  Weight: 60.782 kg (134 lb) 60 kg (132 lb 4.4 oz)     Microbiology: No results found for this or any previous visit (from the past 240 hour(s)).     Blood Culture No results found for: SDES, [place], CULT, REPTSTATUS    Labs: Results for orders placed or performed during the hospital encounter of 12/25/15 (from the past 48 hour(s))  Comprehensive metabolic panel     Status: Abnormal   Collection Time: 12/25/15  2:12 PM  Result Value Ref Range   Sodium 136 135 - 145 mmol/L   Potassium 4.0 3.5 - 5.1 mmol/L  Chloride 100 (L) 101 - 111 mmol/L   CO2 27 22 - 32 mmol/L   Glucose, Bld 257 (H) 65 - 99 mg/dL   BUN 28 (H) 6 - 20 mg/dL   Creatinine, Ser 4.41 (H) 0.61 - 1.24 mg/dL   Calcium 8.3 (L) 8.9 - 10.3 mg/dL   Total Protein 5.3 (L) 6.5 - 8.1 g/dL   Albumin 2.7 (L) 3.5 - 5.0 g/dL    AST 19 15 - 41 U/L   ALT 20 17 - 63 U/L   Alkaline Phosphatase 109 38 - 126 U/L   Total Bilirubin 0.8 0.3 - 1.2 mg/dL   GFR calc non Af Amer 12 (L) >60 mL/min   GFR calc Af Amer 14 (L) >60 mL/min    Comment: (NOTE) The eGFR has been calculated using the CKD EPI equation. This calculation has not been validated in all clinical situations. eGFR's persistently <60 mL/min signify possible Chronic Kidney Disease.    Anion gap 9 5 - 15  CBC     Status: Abnormal   Collection Time: 12/25/15  2:12 PM  Result Value Ref Range   WBC 10.3 4.0 - 10.5 K/uL   RBC 4.18 (L) 4.22 - 5.81 MIL/uL   Hemoglobin 11.7 (L) 13.0 - 17.0 g/dL   HCT 36.3 (L) 39.0 - 52.0 %   MCV 86.8 78.0 - 100.0 fL   MCH 28.0 26.0 - 34.0 pg   MCHC 32.2 30.0 - 36.0 g/dL   RDW 16.3 (H) 11.5 - 15.5 %   Platelets 203 150 - 400 K/uL  CBG monitoring, ED     Status: Abnormal   Collection Time: 12/25/15  6:33 PM  Result Value Ref Range   Glucose-Capillary 185 (H) 65 - 99 mg/dL  Protime-INR     Status: Abnormal   Collection Time: 12/25/15  8:34 PM  Result Value Ref Range   Prothrombin Time 16.2 (H) 11.6 - 15.2 seconds   INR 1.29 0.00 - 1.49  APTT     Status: Abnormal   Collection Time: 12/25/15  8:34 PM  Result Value Ref Range   aPTT 40 (H) 24 - 37 seconds    Comment:        IF BASELINE aPTT IS ELEVATED, SUGGEST PATIENT RISK ASSESSMENT BE USED TO DETERMINE APPROPRIATE ANTICOAGULANT THERAPY.   Glucose, capillary     Status: Abnormal   Collection Time: 12/26/15 12:21 AM  Result Value Ref Range   Glucose-Capillary 260 (H) 65 - 99 mg/dL  Hemoglobin A1c     Status: Abnormal   Collection Time: 12/26/15  5:18 AM  Result Value Ref Range   Hgb A1c MFr Bld 7.4 (H) 4.8 - 5.6 %    Comment: (NOTE)         Pre-diabetes: 5.7 - 6.4         Diabetes: >6.4         Glycemic control for adults with diabetes: <7.0    Mean Plasma Glucose 166 mg/dL    Comment: (NOTE) Performed At: [facility] [place], [place]  [id number] Lindon Romp MD BJ:[id number]   Comprehensive metabolic panel     Status: Abnormal   Collection Time: 12/26/15  5:18 AM  Result Value Ref Range   Sodium 136 135 - 145 mmol/L   Potassium 3.5 3.5 - 5.1 mmol/L   Chloride 103 101 - 111 mmol/L   CO2 25 22 - 32 mmol/L   Glucose, Bld 183 (H) 65 - 99 mg/dL  BUN 30 (H) 6 - 20 mg/dL   Creatinine, Ser 4.21 (H) 0.61 - 1.24 mg/dL   Calcium 7.8 (L) 8.9 - 10.3 mg/dL   Total Protein 4.0 (L) 6.5 - 8.1 g/dL   Albumin 2.2 (L) 3.5 - 5.0 g/dL   AST 14 (L) 15 - 41 U/L   ALT 13 (L) 17 - 63 U/L   Alkaline Phosphatase 89 38 - 126 U/L   Total Bilirubin 0.7 0.3 - 1.2 mg/dL   GFR calc non Af Amer 13 (L) >60 mL/min   GFR calc Af Amer 15 (L) >60 mL/min    Comment: (NOTE) The eGFR has been calculated using the CKD EPI equation. This calculation has not been validated in all clinical situations. eGFR's persistently <60 mL/min signify possible Chronic Kidney Disease.    Anion gap 8 5 - 15  CBC WITH DIFFERENTIAL     Status: Abnormal   Collection Time: 12/26/15  5:18 AM  Result Value Ref Range   WBC 8.5 4.0 - 10.5 K/uL   RBC 3.45 (L) 4.22 - 5.81 MIL/uL   Hemoglobin 9.9 (L) 13.0 - 17.0 g/dL   HCT 29.9 (L) 39.0 - 52.0 %   MCV 86.7 78.0 - 100.0 fL   MCH 28.7 26.0 - 34.0 pg   MCHC 33.1 30.0 - 36.0 g/dL   RDW 16.5 (H) 11.5 - 15.5 %   Platelets 190 150 - 400 K/uL   Neutrophils Relative % 64 %   Neutro Abs 5.4 1.7 - 7.7 K/uL   Lymphocytes Relative 21 %   Lymphs Abs 1.8 0.7 - 4.0 K/uL   Monocytes Relative 14 %   Monocytes Absolute 1.2 (H) 0.1 - 1.0 K/uL   Eosinophils Relative 1 %   Eosinophils Absolute 0.1 0.0 - 0.7 K/uL   Basophils Relative 0 %   Basophils Absolute 0.0 0.0 - 0.1 K/uL  Glucose, capillary     Status: Abnormal   Collection Time: 12/26/15  6:34 AM  Result Value Ref Range   Glucose-Capillary 158 (H) 65 - 99 mg/dL   Comment 1 Notify RN    Comment 2 Document in Chart   Glucose, capillary     Status: Abnormal   Collection  Time: 12/26/15 10:56 AM  Result Value Ref Range   Glucose-Capillary 205 (H) 65 - 99 mg/dL  Glucose, capillary     Status: Abnormal   Collection Time: 12/26/15  6:21 PM  Result Value Ref Range   Glucose-Capillary 115 (H) 65 - 99 mg/dL  Glucose, capillary     Status: Abnormal   Collection Time: 12/26/15 10:53 PM  Result Value Ref Range   Glucose-Capillary 258 (H) 65 - 99 mg/dL  Phosphorus     Status: None   Collection Time: 12/27/15  5:06 AM  Result Value Ref Range   Phosphorus 2.8 2.5 - 4.6 mg/dL  Glucose, capillary     Status: Abnormal   Collection Time: 12/27/15  6:31 AM  Result Value Ref Range   Glucose-Capillary 129 (H) 65 - 99 mg/dL   Comment 1 Notify RN    Comment 2 Document in Chart      Lipid Panel  No results found for: CHOL, TRIG, HDL, CHOLHDL, VLDL, LDLCALC, LDLDIRECT   Lab Results  Component Value Date   HGBA1C 7.4* 12/26/2015     Lab Results  Component Value Date   CREATININE 4.21* 12/26/2015     HPI :75 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 failing to respond to Rituximab leading to tapering off of immunosuppressives. He also carries the diagnosis of Walking Corpse Syndrome and interestingly thinks this is all acute with renal recovery still possible. His last HD treatment was in [place] on Thursday, missed HD Sat secondary to the snow and sustained a fall while walking out of the house yesterday. He doesn't remember the fall and was told to go to the ED where a CT diagnosed a subdural hematoma. He currently denies any worsening of pain in the traumatized area, also denies diplopia, dizziness, headaches or nausea. He also states that he has good UOP with no dyspnea   HOSPITAL COURSE:  Subdural hematoma caused by concussion [facility]) Patient admitted for observation Frequent neuro checks stable. 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 - Would hold any blood thinners/antiplatelet agents until follow up.     Hypertension Patient is not sure as to which medications he is taking.      ESRD on hemodialysis [facility]) continue hemodialysis as per nephrology. This 5 days of dialysis,   Vascular surgery consulted for permanent  HD access Patient will follow-up with Dr.Early for left arm AV fistula creation    Diabetes mellitus, type 2 (HCC) CBG monitoring with regular insulin sliding scale.  hemoglobin  A1c 7.4   Anemia of renal disease Monitor hematocrit and hemoglobin periodically. Erythropoietin supplementation per nephrology as needed.     Discharge Exam: *   Blood pressure 157/93, pulse 88, temperature 98.1 F (36.7 C), temperature source Oral, resp. rate 20, weight 60 kg (132 lb 4.4 oz), SpO2 98 %.   General: Appears calm and comfortable  Head: Occipital laceration with dressing in place.  Eyes: Preexisting anisocoria, right pupil > left pupil normal lids, irises & conjunctiva  ENT: grossly normal hearing, lips & tongue  Neck: no LAD, masses or thyromegaly  Cardiovascular: RRR, no m/r/g. No LE edema.  Telemetry: SR, no arrhythmias   Respiratory: CTA bilaterally, no w/r/r. Normal respiratory effort.  Abdomen: soft, ntnd  Skin: no rash or induration seen on limited exam  Musculoskeletal: grossly normal tone BUE/BLE  Psychiatric: grossly normal mood and affect, speech fluent and appropriate  Neurologic: grossly non-focal.           Follow-up Information    Follow up with [facility], MD. Schedule an appointment as soon as possible for a visit on 12/31/2015.   Specialty:  Internal Medicine   Why:  Renae Gloss f/u appt.with DR.Marland KitchenALEXANDER HADLEY 2:15PM ON 12/31/15   Contact information:   [address] [place] [address] [phone]       Follow up with Consuella Lose, C, MD. Schedule an appointment as soon as possible for a visit on 01/09/2016.   Specialty:  Neurosurgery   Why:  Follow-up of subdural hematoma,[facility] 01/09/16 AT 1:30PM   Contact information:   [address] [place] [place] [address] [phone]       Follow up with Curt Jews, MD. Schedule an appointment as soon as possible for a visit in 1 week.   Specialties:  Vascular Surgery, Cardiology   Contact information:   [place] [address] [phone]       Signed: Reyne Dumas 12/27/2015, 11:15 AM  Time spent >45 mins   

## 2015-12-27 NOTE — Evaluation (Signed)
Physical Therapy Evaluation Patient Details Name: Jeffrey Walls MRN: [id number] DOB: 1941-05-01 Today's Date: 12/27/2015   History of Present Illness  75 y.o. male admitted through the ED after suffering a fall while walking out to his car to go to dialysis. He says he does not remember the fall or immediately afterward. He was able to go back in the house where he noted a laceration on the back of his head and was instructed by his dialysis center to go to the ED.Small bilateral frontal SDH. PMH - ESRD, DM, HTN  Clinical Impression  Pt doing well with mobility and no further PT needed.  Ready for dc from PT standpoint.      Follow Up Recommendations No PT follow up    Equipment Recommendations  None recommended by PT    Recommendations for Other Services       Precautions / Restrictions Precautions Precautions: Fall      Mobility  Bed Mobility Overal bed mobility: Independent                Transfers Overall transfer level: Modified independent               General transfer comment: Minimal unsteadiness. Pt staes he only slept 3 hrs last night and he is "feeling it"  Ambulation/Gait Ambulation/Gait assistance: Independent Ambulation Distance (Feet): 400 Feet Assistive device: None Gait Pattern/deviations: Drifts right/left   Gait velocity interpretation: at or above normal speed for age/gender General Gait Details: Steady gait  Stairs Stairs: Yes Stairs assistance: Modified independent (Device/Increase time) Stair Management: No rails;One rail Right;Alternating pattern;Forwards Number of Stairs: 5 General stair comments: No difficulty even without rail  Wheelchair Mobility    Modified Rankin (Stroke Patients Only)       Balance Overall balance assessment: No apparent balance deficits (not formally assessed) (Pt's recent fall was on the ice)                                           Pertinent Vitals/Pain Pain Assessment:  No/denies pain    Home Living Family/patient expects to be discharged to:: Private residence Living Arrangements: Alone   Type of Home: House Home Access: Stairs to enter Entrance Stairs-Rails: None (sits on stool to helpgo up step) Entrance Stairs-Number of Steps: 2 Home Layout: One level Home Equipment: Walker - 2 wheels;Shower seat;Cane - quad      Prior Function Level of Independence: Independent;Independent with assistive device(s)         Comments: has been using a cane for extra stability. drives self to dialysis. discharged from [facility] beginning of December.      Hand Dominance   Dominant Hand: Right    Extremity/Trunk Assessment   Upper Extremity Assessment: Overall WFL for tasks assessed           Lower Extremity Assessment: Overall WFL for tasks assessed      Cervical / Trunk Assessment: Normal  Communication   Communication: No difficulties  Cognition Arousal/Alertness: Awake/alert Behavior During Therapy: WFL for tasks assessed/performed Overall Cognitive Status: Within Functional Limits for tasks assessed                      General Comments General comments (skin integrity, edema, etc.): xxx    Exercises        Assessment/Plan    PT Assessment Patent does not need  any further PT services  PT Diagnosis Difficulty walking   PT Problem List    PT Treatment Interventions     PT Goals (Current goals can be found in the Care Plan section) Acute Rehab PT Goals Patient Stated Goal: to go home PT Goal Formulation: All assessment and education complete, DC therapy    Frequency     Barriers to discharge        Co-evaluation               End of Session   Activity Tolerance: Patient tolerated treatment well Patient left: in chair;with call bell/phone within reach      Functional Assessment Tool Used: clinical judgement Functional Limitation: Mobility: Walking and moving around Mobility: Walking and Moving  Around Current Status [phone]): 0 percent impaired, limited or restricted Mobility: Walking and Moving Around Goal Status [phone]): 0 percent impaired, limited or restricted Mobility: Walking and Moving Around Discharge Status [phone]): 0 percent impaired, limited or restricted    Time: 1122-1130 PT Time Calculation (min) (ACUTE ONLY): 8 min   Charges:   PT Evaluation $PT Eval Low Complexity: 1 Procedure     PT G Codes:   PT G-Codes **NOT FOR INPATIENT CLASS** Functional Assessment Tool Used: clinical judgement Functional Limitation: Mobility: Walking and moving around Mobility: Walking and Moving Around Current Status (B1478): 0 percent impaired, limited or restricted Mobility: Walking and Moving Around Goal Status (G9562): 0 percent impaired, limited or restricted Mobility: Walking and Moving Around Discharge Status (Z3086): 0 percent impaired, limited or restricted    [facility] 12/27/2015, 11:39 AM [facility] PT [phone]

## 2015-12-28 ENCOUNTER — Telehealth: Payer: Self-pay | Admitting: *Deleted

## 2015-12-28 NOTE — Telephone Encounter (Signed)
Received a staff msg from Dr. Arbie CookeyEarly to contact this patient regarding the need for a left upper arm AVF next week. Patient was [facility]. He is already [facility] (Dr.Hadley) and is getting his HD [facility] According to Care Everywhere section of EPIC, patient has follow up appt with [facility] [phone] on 12-31-15 to discuss AVF. I called the patient and he stated that he hasn't made any decisions about this fistula yet. He says that Dr. Bland SpanHadley is working on referring him to Vascular Sur[facility] in the [facility]. I told him to let us know if he needs our services but otherwise we will not

## 2016-02-28 ENCOUNTER — Encounter (HOSPITAL_COMMUNITY): Payer: Self-pay

## 2016-02-28 ENCOUNTER — Inpatient Hospital Stay (HOSPITAL_COMMUNITY)
Admission: EM | Admit: 2016-02-28 | Discharge: 2016-03-06 | DRG: 673 | Disposition: A | Discharge status: Skilled Nursing Facility | Payer: Medicare HMO | Attending: Internal Medicine | Admitting: Internal Medicine

## 2016-02-28 ENCOUNTER — Emergency Department (HOSPITAL_COMMUNITY): Payer: Medicare HMO

## 2016-02-28 DIAGNOSIS — Z794 Long term (current) use of insulin: Secondary | ICD-10-CM

## 2016-02-28 DIAGNOSIS — Z681 Body mass index (BMI) 19 or less, adult: Secondary | ICD-10-CM

## 2016-02-28 DIAGNOSIS — N186 End stage renal disease: Secondary | ICD-10-CM | POA: Diagnosis present

## 2016-02-28 DIAGNOSIS — E11649 Type 2 diabetes mellitus with hypoglycemia without coma: Secondary | ICD-10-CM | POA: Diagnosis not present

## 2016-02-28 DIAGNOSIS — E43 Unspecified severe protein-calorie malnutrition: Secondary | ICD-10-CM | POA: Diagnosis present

## 2016-02-28 DIAGNOSIS — I1311 Hypertensive heart and chronic kidney disease without heart failure, with stage 5 chronic kidney disease, or end stage renal disease: Secondary | ICD-10-CM | POA: Diagnosis not present

## 2016-02-28 DIAGNOSIS — E8770 Fluid overload, unspecified: Secondary | ICD-10-CM

## 2016-02-28 DIAGNOSIS — R531 Weakness: Secondary | ICD-10-CM | POA: Diagnosis not present

## 2016-02-28 DIAGNOSIS — I2510 Atherosclerotic heart disease of native coronary artery without angina pectoris: Secondary | ICD-10-CM | POA: Diagnosis present

## 2016-02-28 DIAGNOSIS — R296 Repeated falls: Secondary | ICD-10-CM | POA: Diagnosis present

## 2016-02-28 DIAGNOSIS — Z9115 Patient's noncompliance with renal dialysis: Secondary | ICD-10-CM

## 2016-02-28 DIAGNOSIS — D631 Anemia in chronic kidney disease: Secondary | ICD-10-CM | POA: Diagnosis present

## 2016-02-28 DIAGNOSIS — Z7982 Long term (current) use of aspirin: Secondary | ICD-10-CM

## 2016-02-28 DIAGNOSIS — E785 Hyperlipidemia, unspecified: Secondary | ICD-10-CM | POA: Diagnosis present

## 2016-02-28 DIAGNOSIS — D61818 Other pancytopenia: Secondary | ICD-10-CM | POA: Diagnosis present

## 2016-02-28 DIAGNOSIS — Z79899 Other long term (current) drug therapy: Secondary | ICD-10-CM

## 2016-02-28 DIAGNOSIS — Z880 Allergy status to penicillin: Secondary | ICD-10-CM

## 2016-02-28 DIAGNOSIS — K219 Gastro-esophageal reflux disease without esophagitis: Secondary | ICD-10-CM | POA: Diagnosis present

## 2016-02-28 DIAGNOSIS — I10 Essential (primary) hypertension: Secondary | ICD-10-CM | POA: Diagnosis present

## 2016-02-28 DIAGNOSIS — N189 Chronic kidney disease, unspecified: Secondary | ICD-10-CM

## 2016-02-28 DIAGNOSIS — Z888 Allergy status to other drugs, medicaments and biological substances status: Secondary | ICD-10-CM

## 2016-02-28 DIAGNOSIS — L03115 Cellulitis of right lower limb: Secondary | ICD-10-CM | POA: Diagnosis present

## 2016-02-28 DIAGNOSIS — E1121 Type 2 diabetes mellitus with diabetic nephropathy: Secondary | ICD-10-CM | POA: Diagnosis present

## 2016-02-28 DIAGNOSIS — Z992 Dependence on renal dialysis: Secondary | ICD-10-CM

## 2016-02-28 DIAGNOSIS — E1122 Type 2 diabetes mellitus with diabetic chronic kidney disease: Secondary | ICD-10-CM | POA: Diagnosis present

## 2016-02-28 HISTORY — DX: Reserved for inherently not codable concepts without codable children: IMO0001

## 2016-02-28 LAB — CBC WITH DIFFERENTIAL/PLATELET
BASOS ABS: 0 10*3/uL (ref 0.0–0.1)
Basophils Relative: 0 %
EOS PCT: 0 %
Eosinophils Absolute: 0 10*3/uL (ref 0.0–0.7)
HEMATOCRIT: 30.2 % — AB (ref 39.0–52.0)
HEMOGLOBIN: 9.8 g/dL — AB (ref 13.0–17.0)
LYMPHS ABS: 1.6 10*3/uL (ref 0.7–4.0)
LYMPHS PCT: 24 %
MCH: 28.7 pg (ref 26.0–34.0)
MCHC: 32.5 g/dL (ref 30.0–36.0)
MCV: 88.6 fL (ref 78.0–100.0)
Monocytes Absolute: 0.5 10*3/uL (ref 0.1–1.0)
Monocytes Relative: 7 %
NEUTROS ABS: 4.6 10*3/uL (ref 1.7–7.7)
NEUTROS PCT: 69 %
PLATELETS: 131 10*3/uL — AB (ref 150–400)
RBC: 3.41 MIL/uL — AB (ref 4.22–5.81)
RDW: 17.7 % — ABNORMAL HIGH (ref 11.5–15.5)
WBC: 6.6 10*3/uL (ref 4.0–10.5)

## 2016-02-28 LAB — BASIC METABOLIC PANEL
ANION GAP: 11 (ref 5–15)
BUN: 30 mg/dL — AB (ref 6–20)
CHLORIDE: 102 mmol/L (ref 101–111)
CO2: 25 mmol/L (ref 22–32)
Calcium: 7.9 mg/dL — ABNORMAL LOW (ref 8.9–10.3)
Creatinine, Ser: 4.1 mg/dL — ABNORMAL HIGH (ref 0.61–1.24)
GFR, EST AFRICAN AMERICAN: 15 mL/min — AB (ref >60)
GFR, EST NON AFRICAN AMERICAN: 13 mL/min — AB (ref >60)
Glucose, Bld: 208 mg/dL — ABNORMAL HIGH (ref 65–99)
POTASSIUM: 3.7 mmol/L (ref 3.5–5.1)
SODIUM: 138 mmol/L (ref 135–145)

## 2016-02-28 LAB — BRAIN NATRIURETIC PEPTIDE: B Natriuretic Peptide: >4500 pg/mL — ABNORMAL HIGH (ref 0.0–100.0)

## 2016-02-28 LAB — CBG MONITORING, ED: GLUCOSE-CAPILLARY: 199 mg/dL — AB (ref 65–99)

## 2016-02-28 NOTE — ED Notes (Signed)
Pt states he fell on Monday hitting his head and leaving a weeping abrasion on LLE; Pt states he was checked out by EMS but was not brought to the hospital; Pt is in no acute distress at this time;

## 2016-02-28 NOTE — ED Notes (Signed)
CBG is 199 nurse notified

## 2016-02-28 NOTE — ED Notes (Signed)
MD at bedside. 

## 2016-02-28 NOTE — ED Provider Notes (Signed)
CSN: [id number]     Arrival date & time 02/28/16  1922 History   First MD Initiated Contact with Patient 02/28/16 2022     Chief Complaint  Patient presents with  . Chronic Renal Failure     (Consider location/radiation/quality/duration/timing/severity/associated sxs/prior Treatment) HPI Comments: Patient is a 75 year old male with history of type 1 diabetes, end-stage renal disease on hemodialysis, and coronary artery disease. He presents for evaluation of weakness. He has been feeling this way for quite some time, however is become worse over the past several days. He was unable to make it to dialysis on Tuesday due to car problems. His last dialysis was Saturday, 5 days ago. He does feel somewhat short of breath but denies fever or cough. He reports swelling in his legs.  Patient is a 75 y.o. male presenting with weakness. The history is provided by the patient.  Weakness This is a new problem. The current episode started 2 days ago. The problem occurs constantly. The problem has not changed since onset.Nothing aggravates the symptoms. Nothing relieves the symptoms. He has tried nothing for the symptoms. The treatment provided no relief.    Past Medical History  Diagnosis Date  . Renal disorder   . Hypertension   . Diabetes mellitus without complication (HCC)   . Coronary artery disease    History reviewed. No pertinent past surgical history. No family history on file. Social History  Substance Use Topics  . Smoking status: Never Smoker   . Smokeless tobacco: None  . Alcohol Use: No    Review of Systems  Neurological: Positive for weakness.  All other systems reviewed and are negative.     Allergies  Ampicillin and Benadryl  Home Medications   Prior to Admission medications   Medication Sig Start Date End Date Taking? Authorizing Provider  folic acid (FOLVITE) 1 MG tablet Take 1 mg by mouth daily. 08/30/15  Yes Historical Provider, MD  insulin aspart (NOVOLOG) 100  UNIT/ML injection Inject 2-6 Units into the skin 3 (three) times daily before meals. 11/14/15 11/13/16 Yes Historical Provider, MD  insulin detemir (LEVEMIR) 100 UNIT/ML injection Inject 15 Units into the skin daily. 08/18/15 08/17/16 Yes Historical Provider, MD  pantoprazole (PROTONIX) 20 MG tablet Take 20 mg by mouth daily. 08/18/15 08/17/16 Yes Historical Provider, MD  potassium chloride (KLOR-CON M10) 10 MEQ tablet Take 10 mEq by mouth daily. 08/04/15  Yes Historical Provider, MD  zolpidem (AMBIEN) 5 MG tablet Take 5 mg by mouth at bedtime as needed. Sleep 02/15/16  Yes Historical Provider, MD  amLODipine (NORVASC) 10 MG tablet Take 10 mg by mouth daily.    Historical Provider, MD  aspirin EC 81 MG tablet Take 81 mg by mouth daily.    Historical Provider, MD  atorvastatin (LIPITOR) 10 MG tablet Take 10 mg by mouth every evening.    Historical Provider, MD  citalopram (CELEXA) 20 MG tablet Take 20 mg by mouth daily.    Historical Provider, MD  FLUDROCORTISONE ACETATE PO Take 0.1 mg by mouth daily.    Historical Provider, MD  glimepiride (AMARYL) 2 MG tablet Take 2 mg by mouth daily.    Historical Provider, MD  insulin aspart (NOVOLOG) 100 UNIT/ML injection Inject 2-6 Units into the skin 3 (three) times daily before meals. Per sliding scale    Historical Provider, MD  lisinopril-hydrochlorothiazide (PRINZIDE,ZESTORETIC) 20-12.5 MG tablet Take 1 tablet by mouth daily.    Historical Provider, MD  metoprolol succinate (TOPROL-XL) 50 MG 24 hr tablet Take  50 mg by mouth daily. 02/04/16   Historical Provider, MD  vitamin B-12 (CYANOCOBALAMIN) 1000 MCG tablet Take 1,000 mcg by mouth daily.    Historical Provider, MD   BP 164/85 mmHg  Pulse 89  Temp(Src) 98.8 F (37.1 C) (Oral)  Resp 17  SpO2 99% Physical Exam  Constitutional: He is oriented to person, place, and time.  Patient is a 75 year old male in no acute distress. He is awake alert, and appropriate. He does appear chronically ill.  HENT:  Head:  Normocephalic and atraumatic.  Neck: Normal range of motion. Neck supple.  Cardiovascular: Normal rate, regular rhythm and normal heart sounds.   No murmur heard. Pulmonary/Chest: Effort normal and breath sounds normal. No respiratory distress. He has no wheezes. He has no rales.  Abdominal: Soft. Bowel sounds are normal. He exhibits no distension. There is no tenderness.  Musculoskeletal: He exhibits edema.  There is 2-3+ pitting edema of both lower extremities.  Neurological: He is alert and oriented to person, place, and time. No cranial nerve deficit. He exhibits normal muscle tone. Coordination normal.  Skin: Skin is warm and dry.  Nursing note and vitals reviewed.   ED Course  Procedures (including critical care time) Labs Review Labs Reviewed  CBG MONITORING, ED - Abnormal; Notable for the following:    Glucose-Capillary 199 (*)    All other components within normal limits  BASIC METABOLIC PANEL  CBC WITH DIFFERENTIAL/PLATELET  BRAIN NATRIURETIC PEPTIDE    Imaging Review No results found. I have personally reviewed and evaluated these images and lab results as part of my medical decision-making.   EKG Interpretation   Date/Time:  Thursday February 28 2016 21:49:34 EDT Ventricular Rate:  95 PR Interval:  154 QRS Duration: 88 QT Interval:  335 QTC Calculation: 421 R Axis:   54 Text Interpretation:  Sinus rhythm Low voltage, extremity leads  Anteroseptal infarct, old Nonspecific repol abnormality, diffuse leads  Confirmed by Larnie Heart  MD, Kashia Brossard ([id number]) on 02/28/2016 10:26:26 PM      MDM   Final diagnoses:  None    Patient is a 75 year old male with history of end-stage renal disease on hemodialysis. He presents for evaluation of weakness and fall. He fell at home today and had difficulty getting to his feet. He reports feeling weak and short of breath. His workup reveals a markedly elevated BNP and chest x-ray which shows interstitial edema. He has lower extremity  edema that he reports is worse than usual. He appears to be volume overloaded and will likely require dialysis to correct this. His oxygen saturations are adequate at present. I've discussed this with Dr. Kathrene Bongo from nephrology who is recommending overnight admission and dialysis in the morning. I've spoken with Dr. Katrinka Blazing from the hospitalist service who agrees to admit.    Geoffery Lyons, MD 02/28/16 [phone]

## 2016-02-28 NOTE — ED Notes (Signed)
Per EMS pt called them due to needing dialysis; Pt has dialysis T,TH,S however he has not had dialysis since this past Saturday; Pt has porta cath on upper right chest; Per EMS cath looks infected and has redness to site; Pt ambulated to the stretcher for EMS; Pt was able to move fine for EMS until arrival to ED pt began not wanting to do for hisself; Pt c/o of SOB on arrival to ED but no obvious signs of respiratory distress; Pt states he did not get dialysis due to car trouble; Pt is also non compliant with meds and has not taken meds all with no reason for not taking; Pt is 98% on O2 on arrival to ED. Pt A&O x 4 on arrival ; Pt c/o pain of generalized pain 4/10 on arrival

## 2016-02-28 NOTE — ED Notes (Signed)
Patient transported to X-ray 

## 2016-02-29 ENCOUNTER — Encounter (HOSPITAL_COMMUNITY): Payer: Self-pay | Admitting: *Deleted

## 2016-02-29 DIAGNOSIS — Z7982 Long term (current) use of aspirin: Secondary | ICD-10-CM | POA: Diagnosis not present

## 2016-02-29 DIAGNOSIS — I2510 Atherosclerotic heart disease of native coronary artery without angina pectoris: Secondary | ICD-10-CM | POA: Diagnosis present

## 2016-02-29 DIAGNOSIS — Z992 Dependence on renal dialysis: Secondary | ICD-10-CM | POA: Diagnosis not present

## 2016-02-29 DIAGNOSIS — D61818 Other pancytopenia: Secondary | ICD-10-CM | POA: Diagnosis present

## 2016-02-29 DIAGNOSIS — E0821 Diabetes mellitus due to underlying condition with diabetic nephropathy: Secondary | ICD-10-CM | POA: Diagnosis not present

## 2016-02-29 DIAGNOSIS — Z79899 Other long term (current) drug therapy: Secondary | ICD-10-CM | POA: Diagnosis not present

## 2016-02-29 DIAGNOSIS — R296 Repeated falls: Secondary | ICD-10-CM

## 2016-02-29 DIAGNOSIS — N186 End stage renal disease: Secondary | ICD-10-CM | POA: Diagnosis present

## 2016-02-29 DIAGNOSIS — Z681 Body mass index (BMI) 19 or less, adult: Secondary | ICD-10-CM | POA: Diagnosis not present

## 2016-02-29 DIAGNOSIS — E11649 Type 2 diabetes mellitus with hypoglycemia without coma: Secondary | ICD-10-CM | POA: Diagnosis not present

## 2016-02-29 DIAGNOSIS — E785 Hyperlipidemia, unspecified: Secondary | ICD-10-CM | POA: Diagnosis present

## 2016-02-29 DIAGNOSIS — Z880 Allergy status to penicillin: Secondary | ICD-10-CM | POA: Diagnosis not present

## 2016-02-29 DIAGNOSIS — E43 Unspecified severe protein-calorie malnutrition: Secondary | ICD-10-CM | POA: Diagnosis present

## 2016-02-29 DIAGNOSIS — K219 Gastro-esophageal reflux disease without esophagitis: Secondary | ICD-10-CM | POA: Diagnosis present

## 2016-02-29 DIAGNOSIS — Z794 Long term (current) use of insulin: Secondary | ICD-10-CM | POA: Diagnosis not present

## 2016-02-29 DIAGNOSIS — E1122 Type 2 diabetes mellitus with diabetic chronic kidney disease: Secondary | ICD-10-CM | POA: Diagnosis present

## 2016-02-29 DIAGNOSIS — Z9115 Patient's noncompliance with renal dialysis: Secondary | ICD-10-CM | POA: Diagnosis not present

## 2016-02-29 DIAGNOSIS — I10 Essential (primary) hypertension: Secondary | ICD-10-CM | POA: Diagnosis not present

## 2016-02-29 DIAGNOSIS — D631 Anemia in chronic kidney disease: Secondary | ICD-10-CM | POA: Diagnosis not present

## 2016-02-29 DIAGNOSIS — I1311 Hypertensive heart and chronic kidney disease without heart failure, with stage 5 chronic kidney disease, or end stage renal disease: Secondary | ICD-10-CM | POA: Diagnosis present

## 2016-02-29 DIAGNOSIS — E1121 Type 2 diabetes mellitus with diabetic nephropathy: Secondary | ICD-10-CM | POA: Diagnosis present

## 2016-02-29 DIAGNOSIS — R531 Weakness: Secondary | ICD-10-CM | POA: Diagnosis present

## 2016-02-29 DIAGNOSIS — E8770 Fluid overload, unspecified: Secondary | ICD-10-CM | POA: Diagnosis present

## 2016-02-29 DIAGNOSIS — L03115 Cellulitis of right lower limb: Secondary | ICD-10-CM | POA: Diagnosis present

## 2016-02-29 DIAGNOSIS — Z888 Allergy status to other drugs, medicaments and biological substances status: Secondary | ICD-10-CM | POA: Diagnosis not present

## 2016-02-29 LAB — IRON AND TIBC
IRON: 22 ug/dL — AB (ref 45–182)
Saturation Ratios: 17 % — ABNORMAL LOW (ref 17.9–39.5)
TIBC: 129 ug/dL — AB (ref 250–450)
UIBC: 107 ug/dL

## 2016-02-29 LAB — RENAL FUNCTION PANEL
ANION GAP: 7 (ref 5–15)
Albumin: 2 g/dL — ABNORMAL LOW (ref 3.5–5.0)
BUN: 32 mg/dL — ABNORMAL HIGH (ref 6–20)
CHLORIDE: 104 mmol/L (ref 101–111)
CO2: 28 mmol/L (ref 22–32)
Calcium: 7.6 mg/dL — ABNORMAL LOW (ref 8.9–10.3)
Creatinine, Ser: 4.04 mg/dL — ABNORMAL HIGH (ref 0.61–1.24)
GFR calc non Af Amer: 13 mL/min — ABNORMAL LOW (ref >60)
GFR, EST AFRICAN AMERICAN: 16 mL/min — AB (ref >60)
Glucose, Bld: 204 mg/dL — ABNORMAL HIGH (ref 65–99)
Phosphorus: 3.9 mg/dL (ref 2.5–4.6)
Potassium: 3.6 mmol/L (ref 3.5–5.1)
Sodium: 139 mmol/L (ref 135–145)

## 2016-02-29 LAB — FERRITIN: Ferritin: 1809 ng/mL — ABNORMAL HIGH (ref 24–336)

## 2016-02-29 LAB — GLUCOSE, CAPILLARY
GLUCOSE-CAPILLARY: 139 mg/dL — AB (ref 65–99)
GLUCOSE-CAPILLARY: 124 mg/dL — AB (ref 65–99)
GLUCOSE-CAPILLARY: 108 mg/dL — AB (ref 65–99)
GLUCOSE-CAPILLARY: 216 mg/dL — AB (ref 65–99)
Glucose-Capillary: 180 mg/dL — ABNORMAL HIGH (ref 65–99)

## 2016-02-29 LAB — CBC
HCT: 27.1 % — ABNORMAL LOW (ref 39.0–52.0)
HEMOGLOBIN: 8.5 g/dL — AB (ref 13.0–17.0)
MCH: 27.8 pg (ref 26.0–34.0)
MCHC: 31.4 g/dL (ref 30.0–36.0)
MCV: 88.6 fL (ref 78.0–100.0)
Platelets: 128 10*3/uL — ABNORMAL LOW (ref 150–400)
RBC: 3.06 MIL/uL — AB (ref 4.22–5.81)
RDW: 17.7 % — ABNORMAL HIGH (ref 11.5–15.5)
WBC: 7.2 10*3/uL (ref 4.0–10.5)

## 2016-02-29 LAB — MRSA PCR SCREENING: MRSA by PCR: NEGATIVE

## 2016-02-29 LAB — POTASSIUM: POTASSIUM: 3.4 mmol/L — AB (ref 3.5–5.1)

## 2016-02-29 MED ORDER — ATORVASTATIN CALCIUM 10 MG PO TABS
10.0000 mg | ORAL_TABLET | Freq: Every evening | ORAL | Status: DC
Start: 2016-02-29 — End: 2016-03-06
  Administered 2016-02-29 – 2016-03-06 (×7): 10 mg via ORAL
  Filled 2016-02-29 (×7): qty 1

## 2016-02-29 MED ORDER — PANTOPRAZOLE SODIUM 20 MG PO TBEC
20.0000 mg | DELAYED_RELEASE_TABLET | Freq: Every day | ORAL | Status: DC
  Administered 2016-02-29 – 2016-03-06 (×7): 20 mg via ORAL
  Filled 2016-02-29 (×7): qty 1

## 2016-02-29 MED ORDER — CITALOPRAM HYDROBROMIDE 20 MG PO TABS
20.0000 mg | ORAL_TABLET | Freq: Every day | ORAL | Status: DC
  Administered 2016-02-29 – 2016-03-06 (×7): 20 mg via ORAL
  Filled 2016-02-29 (×8): qty 1

## 2016-02-29 MED ORDER — HYDROCHLOROTHIAZIDE 12.5 MG PO CAPS
12.5000 mg | ORAL_CAPSULE | Freq: Every day | ORAL | Status: DC
  Administered 2016-02-29: 12.5 mg via ORAL
  Filled 2016-02-29: qty 1

## 2016-02-29 MED ORDER — SODIUM CHLORIDE 0.9 % IV SOLN: 100.0000 mL | INTRAVENOUS | Status: DC | PRN

## 2016-02-29 MED ORDER — DARBEPOETIN ALFA 60 MCG/0.3ML IJ SOSY
60.0000 ug | PREFILLED_SYRINGE | INTRAMUSCULAR | Status: DC
  Administered 2016-02-29: 60 ug via INTRAVENOUS

## 2016-02-29 MED ORDER — DARBEPOETIN ALFA 60 MCG/0.3ML IJ SOSY
PREFILLED_SYRINGE | INTRAMUSCULAR | Status: AC
  Filled 2016-02-29: qty 0.3

## 2016-02-29 MED ORDER — INSULIN ASPART 100 UNIT/ML ~~LOC~~ SOLN
0.0000 [IU] | Freq: Every day | SUBCUTANEOUS | Status: DC
  Administered 2016-02-29 – 2016-03-05 (×2): 2 [IU] via SUBCUTANEOUS

## 2016-02-29 MED ORDER — DARBEPOETIN ALFA 60 MCG/0.3ML IJ SOSY: 60.0000 ug | PREFILLED_SYRINGE | INTRAMUSCULAR | Status: DC

## 2016-02-29 MED ORDER — LISINOPRIL-HYDROCHLOROTHIAZIDE 20-12.5 MG PO TABS: 1.0000 | ORAL_TABLET | Freq: Every day | ORAL | Status: DC

## 2016-02-29 MED ORDER — ACETAMINOPHEN 325 MG PO TABS: 650.0000 mg | ORAL_TABLET | Freq: Four times a day (QID) | ORAL | Status: DC | PRN

## 2016-02-29 MED ORDER — AMLODIPINE BESYLATE 10 MG PO TABS
10.0000 mg | ORAL_TABLET | Freq: Every day | ORAL | Status: DC
  Administered 2016-02-29: 10 mg via ORAL
  Filled 2016-02-29: qty 1

## 2016-02-29 MED ORDER — PENTAFLUOROPROP-TETRAFLUOROETH EX AERO: 1.0000 "application " | INHALATION_SPRAY | CUTANEOUS | Status: DC | PRN

## 2016-02-29 MED ORDER — ALTEPLASE 2 MG IJ SOLR: 2.0000 mg | Freq: Once | INTRAMUSCULAR | Status: DC | PRN

## 2016-02-29 MED ORDER — SODIUM CHLORIDE 0.9% FLUSH
3.0000 mL | Freq: Two times a day (BID) | INTRAVENOUS | Status: DC
  Administered 2016-02-29 – 2016-03-02 (×5): 3 mL via INTRAVENOUS
  Administered 2016-03-03: 10 mL via INTRAVENOUS
  Administered 2016-03-03: 3 mL via INTRAVENOUS
  Administered 2016-03-04: 10 mL via INTRAVENOUS
  Administered 2016-03-05 – 2016-03-06 (×3): 3 mL via INTRAVENOUS

## 2016-02-29 MED ORDER — FLUDROCORTISONE ACETATE 0.1 MG PO TABS
0.1000 mg | ORAL_TABLET | Freq: Every day | ORAL | Status: DC
  Administered 2016-02-29 – 2016-03-06 (×7): 0.1 mg via ORAL
  Filled 2016-02-29 (×14): qty 1

## 2016-02-29 MED ORDER — LIDOCAINE HCL (PF) 1 % IJ SOLN: 5.0000 mL | INTRAMUSCULAR | Status: DC | PRN

## 2016-02-29 MED ORDER — LISINOPRIL 20 MG PO TABS
20.0000 mg | ORAL_TABLET | Freq: Every day | ORAL | Status: DC
Start: 2016-02-29 — End: 2016-03-06
  Administered 2016-02-29 – 2016-03-06 (×7): 20 mg via ORAL
  Filled 2016-02-29 (×7): qty 1

## 2016-02-29 MED ORDER — ONDANSETRON HCL 4 MG/2ML IJ SOLN: 4.0000 mg | Freq: Four times a day (QID) | INTRAMUSCULAR | Status: DC | PRN

## 2016-02-29 MED ORDER — VITAMIN B-12 1000 MCG PO TABS
1000.0000 ug | ORAL_TABLET | Freq: Every day | ORAL | Status: DC
  Administered 2016-02-29 – 2016-03-06 (×7): 1000 ug via ORAL
  Filled 2016-02-29 (×7): qty 1

## 2016-02-29 MED ORDER — METOPROLOL SUCCINATE ER 50 MG PO TB24
50.0000 mg | ORAL_TABLET | Freq: Every day | ORAL | Status: DC
  Administered 2016-02-29 – 2016-03-06 (×7): 50 mg via ORAL
  Filled 2016-02-29 (×7): qty 1

## 2016-02-29 MED ORDER — ONDANSETRON HCL 4 MG PO TABS: 4.0000 mg | ORAL_TABLET | Freq: Four times a day (QID) | ORAL | Status: DC | PRN

## 2016-02-29 MED ORDER — FOLIC ACID 1 MG PO TABS
1.0000 mg | ORAL_TABLET | Freq: Every day | ORAL | Status: DC
  Administered 2016-02-29 – 2016-03-06 (×7): 1 mg via ORAL
  Filled 2016-02-29 (×7): qty 1

## 2016-02-29 MED ORDER — HEPARIN SODIUM (PORCINE) 1000 UNIT/ML DIALYSIS: 1000.0000 [IU] | INTRAMUSCULAR | Status: DC | PRN

## 2016-02-29 MED ORDER — POTASSIUM CHLORIDE CRYS ER 10 MEQ PO TBCR
10.0000 meq | EXTENDED_RELEASE_TABLET | Freq: Every day | ORAL | Status: DC
  Administered 2016-02-29 – 2016-03-02 (×3): 10 meq via ORAL
  Filled 2016-02-29 (×3): qty 1

## 2016-02-29 MED ORDER — INSULIN DETEMIR 100 UNIT/ML ~~LOC~~ SOLN
15.0000 [IU] | Freq: Every day | SUBCUTANEOUS | Status: DC
  Administered 2016-03-02 – 2016-03-03 (×2): 15 [IU] via SUBCUTANEOUS
  Filled 2016-02-29 (×5): qty 0.15

## 2016-02-29 MED ORDER — LIDOCAINE-PRILOCAINE 2.5-2.5 % EX CREA
1.0000 "application " | TOPICAL_CREAM | CUTANEOUS | Status: DC | PRN
  Filled 2016-02-29: qty 5

## 2016-02-29 MED ORDER — ACETAMINOPHEN 650 MG RE SUPP: 650.0000 mg | Freq: Four times a day (QID) | RECTAL | Status: DC | PRN

## 2016-02-29 MED ORDER — INSULIN ASPART 100 UNIT/ML ~~LOC~~ SOLN
0.0000 [IU] | Freq: Three times a day (TID) | SUBCUTANEOUS | Status: DC
  Administered 2016-02-29: 1 [IU] via SUBCUTANEOUS
  Administered 2016-03-01: 2 [IU] via SUBCUTANEOUS
  Administered 2016-03-01 – 2016-03-02 (×2): 1 [IU] via SUBCUTANEOUS
  Administered 2016-03-05: 2 [IU] via SUBCUTANEOUS
  Administered 2016-03-06: 1 [IU] via SUBCUTANEOUS
  Administered 2016-03-06: 2 [IU] via SUBCUTANEOUS

## 2016-02-29 MED ORDER — ASPIRIN EC 81 MG PO TBEC
81.0000 mg | DELAYED_RELEASE_TABLET | Freq: Every day | ORAL | Status: DC
  Administered 2016-02-29 – 2016-03-06 (×7): 81 mg via ORAL
  Filled 2016-02-29 (×7): qty 1

## 2016-02-29 MED ORDER — ZOLPIDEM TARTRATE 5 MG PO TABS
5.0000 mg | ORAL_TABLET | Freq: Every evening | ORAL | Status: DC | PRN
  Administered 2016-03-01 – 2016-03-04 (×3): 5 mg via ORAL
  Filled 2016-02-29 (×3): qty 1

## 2016-02-29 MED ORDER — HEPARIN SODIUM (PORCINE) 5000 UNIT/ML IJ SOLN
5000.0000 [IU] | Freq: Three times a day (TID) | INTRAMUSCULAR | Status: DC
  Filled 2016-02-29 (×4): qty 1

## 2016-02-29 NOTE — Care Management Note (Addendum)
Case Management Note  Patient Details  Name: Jeffrey Walls MRN: [id number] Date of Birth: 06/24/1941  Subjective/Objective:                    Action/Plan:  Observation letter given  [facility]( [employer] Medicare ) . Patient states he lives alone and his legs are very weak . Daughter lives [place] away in [place] .   PT eval ordered .   Patient stated that if PT recommends SNF  and insurance will cover SNF , he is agreeable to go to SNF at discharge.  Jeffrey Walls ( [phone] ) from patient's dialysis center called , Onalee HuaDavid concerned about patient living alone and not being able to get to dialysis . Per Onalee Huaavid patient lives in [place] and [place] in [place] , therefore he has no assistance with transportation to dialysis . If patient had dialysis at [facility] he would have assistance with transportation, however, [name] has declined patient in past because he does not have a permanent dialysis access.      Expected Discharge Date:                  Expected Discharge Plan:     In-House Referral:     Discharge planning Services     Post Acute Care Choice:    Choice offered to:  Patient  DME Arranged:    DME Agency:     HH Arranged:    HH Agency:     Status of Service:  In process, will continue to follow  Medicare Important Message Given:    Date Medicare IM Given:    Medicare IM give by:    Date Additional Medicare IM Given:    Additional Medicare Important Message give by:     If discussed at Long Length of Stay Meetings, dates discussed:    Additional Comments:  Jeffrey Walls, Jeffrey [place] Marie, RN 02/29/2016, 10:33 AM

## 2016-02-29 NOTE — Progress Notes (Signed)
PT Cancellation Note  Patient Details Name: Jeffrey Walls MRN: [id number] DOB: [birth year]   Cancelled Treatment:    Reason Eval/Treat Not Completed: Patient at procedure or test/unavailable.  Will try later as time and pt allow.   Jeffrey Walls, Jeffrey Walls 02/29/2016, 2:03 PM   Jeffrey Walls, PT MS Acute Rehab Dept. Number: [facility] [id number] and [facility] [phone]

## 2016-02-29 NOTE — Consult Note (Signed)
Shannon Kirkendall Admit Date: 02/28/2016 02/29/2016 Arita Miss Requesting Physician:  Katrinka Blazing MD  Reason for Consult:  Comanagement of ESRD HPI:  70M seen at the request of Dr. Katrinka Blazing for the management of ESRD and dialysis dependence. Patient receives dialysis in [place] with [facility], ?Dr. Bland Span.  He started in December 2016. He uses a right IJ tunneled catheter and has refused placement of an AV fistula. He tells me that his dialysis experience has been miserable. He has pronounced and prolonged fatigue following treatments and now has chronic weakness and feels unable to do any meaningful or purposeful activity. His car broke down and he has not been to dialysis since Tuesday. He presented to [facility] because of falls which have been chronic.  He lives in [place] but has not been accepted to that unit because of the lack of a fistula.  He lives alone. He has no assistance for transportation.  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 He recently had a subdural hematoma after a fall.  Outpatient dialysis orders and records not currently available.   ROS Balance of 12 systems is negative w/ exceptions as above  PMH  Past Medical History  Diagnosis Date  . Renal disorder   . Hypertension   . Diabetes mellitus without complication (HCC)   . Coronary artery disease   . Shortness of breath dyspnea    PSH History reviewed. No pertinent past surgical history. FH History reviewed. No pertinent family history. SH  reports that he has never smoked. He does not have any smokeless tobacco history on file. He reports that he does not drink alcohol or use illicit drugs. Allergies  Allergies  Allergen Reactions  . Ampicillin     Ankle swelling  . Benadryl [Diphenhydramine Hcl]     Rash   Home medications Prior to Admission medications   Medication Sig Start Date End Date Taking? Authorizing Provider  aspirin EC 81 MG tablet Take 81 mg by mouth  daily.   Yes Historical Provider, MD  metoprolol succinate (TOPROL-XL) 50 MG 24 hr tablet Take 50 mg by mouth daily. 02/04/16  Yes Historical Provider, MD  vitamin B-12 (CYANOCOBALAMIN) 1000 MCG tablet Take 1,000 mcg by mouth daily.   Yes Historical Provider, MD  zolpidem (AMBIEN) 5 MG tablet Take 5 mg by mouth at bedtime as needed. Sleep 02/15/16  Yes Historical Provider, MD  amLODipine (NORVASC) 10 MG tablet Take 10 mg by mouth daily.    Historical Provider, MD  atorvastatin (LIPITOR) 10 MG tablet Take 10 mg by mouth every evening.    Historical Provider, MD  citalopram (CELEXA) 20 MG tablet Take 20 mg by mouth daily.    Historical Provider, MD  FLUDROCORTISONE ACETATE PO Take 0.1 mg by mouth daily.    Historical Provider, MD  folic acid (FOLVITE) 1 MG tablet Take 1 mg by mouth daily. 08/30/15   Historical Provider, MD  glimepiride (AMARYL) 2 MG tablet Take 2 mg by mouth daily.    Historical Provider, MD  insulin aspart (NOVOLOG) 100 UNIT/ML injection Inject 2-6 Units into the skin 3 (three) times daily before meals. Per sliding scale    Historical Provider, MD  insulin aspart (NOVOLOG) 100 UNIT/ML injection Inject 2-6 Units into the skin 3 (three) times daily before meals. 11/14/15 11/13/16  Historical Provider, MD  insulin detemir (LEVEMIR) 100 UNIT/ML injection Inject 15 Units into the skin daily. 08/18/15 08/17/16  Historical Provider, MD  lisinopril-hydrochlorothiazide (PRINZIDE,ZESTORETIC) 20-12.5 MG  tablet Take 1 tablet by mouth daily.    Historical Provider, MD  pantoprazole (PROTONIX) 20 MG tablet Take 20 mg by mouth daily. 08/18/15 08/17/16  Historical Provider, MD  potassium chloride (KLOR-CON M10) 10 MEQ tablet Take 10 mEq by mouth daily. 08/04/15   Historical Provider, MD    Current Medications Scheduled Meds: . amLODipine  10 mg Oral Daily  . aspirin EC  81 mg Oral Daily  . atorvastatin  10 mg Oral QPM  . citalopram  20 mg Oral Daily  . fludrocortisone  0.1 mg Oral Daily  . folic acid  1  mg Oral Daily  . heparin  5,000 Units Subcutaneous 3 times per day  . lisinopril  20 mg Oral Daily   And  . hydrochlorothiazide  12.5 mg Oral Daily  . insulin aspart  0-5 Units Subcutaneous QHS  . insulin aspart  0-9 Units Subcutaneous TID WC  . insulin detemir  15 Units Subcutaneous Daily  . metoprolol succinate  50 mg Oral Daily  . pantoprazole  20 mg Oral Daily  . potassium chloride  10 mEq Oral Daily  . sodium chloride flush  3 mL Intravenous Q12H  . vitamin B-12  1,000 mcg Oral Daily   Continuous Infusions:  PRN Meds:.acetaminophen **OR** acetaminophen, ondansetron **OR** ondansetron (ZOFRAN) IV, zolpidem  CBC  Recent Labs Lab 02/28/16 2200  WBC 6.6  NEUTROABS 4.6  HGB 9.8*  HCT 30.2*  MCV 88.6  PLT 131*   Basic Metabolic Panel  Recent Labs Lab 02/28/16 2200 02/29/16 0700  NA 138  --   K 3.7 3.4*  CL 102  --   CO2 25  --   GLUCOSE 208*  --   BUN 30*  --   CREATININE 4.10*  --   CALCIUM 7.9*  --     Physical Exam  Blood pressure 168/86, pulse 82, temperature 98.1 F (36.7 C), temperature source Oral, resp. rate 18, weight 62.7 kg (138 lb 3.7 oz), SpO2 100 %. GEN: NAD ENT: NCAT, poor dentition EYES: EOMI CV: RRR, no rub PULM: CTAB ABD: s/nt/nd SKIN: no rashes/lesions; R IJ TDC EXT:3+ LEE   Assessment 89M with frequent falls, progressive debility, lives alone w/o much support, ESRD tolerating poorly.  Has volume overload  1. ESRD 1. [place], can't get transportation 2. Not accepted to [facility] -- no AVF; has been encouraged to do so 3. No HD since 3/11 4. Using R IJ Tunneled Cath 2. Debility / Falls 3. 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 HTN/Vol on CCB, ACEi, Thiazide, MTP 6. CKD-BMD 7. Anemia  Plan 1. HD today, 4h, 3L UF, no heparin, 4K bath 2. Req outpt records 3. Cont to talk with pt about obtaining permanent access so can be accepted to [facility] 4. Provide aranesp today 5. Check Iron levels 6. HD  again tomorrow   [name] Summer Mccolgan MD [phone] pgr 02/29/2016, 11:52 AM

## 2016-02-29 NOTE — Procedures (Signed)
I was present at this dialysis session. I have reviewed the session itself and made appropriate changes.   Pt toleratign well.  Discussed permanent access and pt is agreeable. Will have [facility] come by.  Perhaps can rec prior to discharge next week and transition to [facility].  [name] Malaysha Arlen  MD 02/29/2016, 3:10 PM

## 2016-02-29 NOTE — H&P (Signed)
[facility] History and Physical  Christianne Dolindgar Bour ZOX:[id number] DOB: Nov 15, 1941 DOA: 02/28/2016  Referring physician: ED PCP: Theressa StampsHADLEY,ALEXANDER, MD   Chief Complaint:  Weakness and needing dialysis  HPI:  Mr. 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 He normally dialyzes in [place] and recently had car issues where it overheated.  He reports no way of getting back and forth to his dialysis treatments. Patient notes that he lives alone and has no idea what medications he is on. He complains of associated symptoms of shortness of breath and lower extremity swelling. He has been recently falling a lot because of weakness in his legs. He denies any recent fever, chills, chest pain, or cough. Patient is still able to make some urine.  Upon admission patient is evaluated and seen to have hemoglobin 9.8, platelets 131, BUN 30, creatinine 4.1, calcium 7.9, BNP elevated at greater than 4500. Chest x-ray showed small bilateral pleural effusions with vascular congestion and mild cardiomegaly.    Review of Systems  Constitutional: Positive for malaise/fatigue. Negative for fever and chills.  HENT: Negative for ear pain and tinnitus.   Eyes: Negative for double vision and photophobia.  Respiratory: Positive for shortness of breath. Negative for cough.   Cardiovascular: Positive for leg swelling. Negative for chest pain.  Gastrointestinal: Negative for nausea, vomiting and abdominal pain.  Genitourinary: Negative for urgency and frequency.  Musculoskeletal: Positive for joint pain and falls.  Skin: Negative for itching and rash.  Neurological: Positive for weakness. Negative for tremors, seizures and loss of consciousness.  Endo/Heme/Allergies: Negative for environmental  allergies and polydipsia.  Psychiatric/Behavioral: Negative for suicidal ideas and substance abuse.       Past Medical History  Diagnosis Date  . Renal disorder   . Hypertension   . Diabetes mellitus without complication (HCC)   . Coronary artery disease      History reviewed. No pertinent past surgical history.    Social History:  reports that he has never smoked. He does not have any smokeless tobacco history on file. He reports that he does not drink alcohol or use illicit drugs. Where does patient live--home  alone   Allergies  Allergen Reactions  . Ampicillin     Ankle swelling  . Benadryl [Diphenhydramine Hcl]     Rash    No family history on file.      Prior to Admission medications   Medication Sig Start Date End Date Taking? Authorizing Provider  aspirin EC 81 MG tablet Take 81 mg by mouth daily.   Yes Historical Provider, MD  metoprolol succinate (TOPROL-XL) 50 MG 24 hr tablet Take 50 mg by mouth daily. 02/04/16  Yes Historical Provider, MD  vitamin B-12 (CYANOCOBALAMIN) 1000 MCG tablet Take 1,000 mcg by mouth daily.   Yes Historical Provider, MD  zolpidem (AMBIEN) 5 MG tablet Take 5 mg by mouth at bedtime as needed. Sleep 02/15/16  Yes Historical Provider, MD  amLODipine (NORVASC) 10 MG tablet Take 10 mg by mouth daily.    Historical Provider, MD  atorvastatin (LIPITOR) 10 MG tablet Take 10 mg by mouth every evening.    Historical Provider, MD  citalopram (CELEXA) 20 MG tablet Take 20 mg by mouth daily.    Historical Provider, MD  FLUDROCORTISONE ACETATE PO Take 0.1 mg by mouth daily.  Historical Provider, MD  folic acid (FOLVITE) 1 MG tablet Take 1 mg by mouth daily. 08/30/15   Historical Provider, MD  glimepiride (AMARYL) 2 MG tablet Take 2 mg by mouth daily.    Historical Provider, MD  insulin aspart (NOVOLOG) 100 UNIT/ML injection Inject 2-6 Units into the skin 3 (three) times daily before meals. Per sliding scale    Historical Provider, MD  insulin  aspart (NOVOLOG) 100 UNIT/ML injection Inject 2-6 Units into the skin 3 (three) times daily before meals. 11/14/15 11/13/16  Historical Provider, MD  insulin detemir (LEVEMIR) 100 UNIT/ML injection Inject 15 Units into the skin daily. 08/18/15 08/17/16  Historical Provider, MD  lisinopril-hydrochlorothiazide (PRINZIDE,ZESTORETIC) 20-12.5 MG tablet Take 1 tablet by mouth daily.    Historical Provider, MD  pantoprazole (PROTONIX) 20 MG tablet Take 20 mg by mouth daily. 08/18/15 08/17/16  Historical Provider, MD  potassium chloride (KLOR-CON M10) 10 MEQ tablet Take 10 mEq by mouth daily. 08/04/15   Historical Provider, MD     Physical Exam: Filed Vitals:   02/28/16 2130 02/28/16 2230 02/28/16 2330 02/29/16 0123  BP: 182/94 177/99 156/94 173/92  Pulse: 88 90 88 87  Temp:    99.6 F (37.6 C)  TempSrc:    Oral  Resp: SpO2: 97% 100% 98% 99%     Constitutional: Vital signs reviewed. Patient is a well-developed and well-nourished in no acute distress and cooperative with exam. Alert and oriented x3.  Head: Normocephalic and atraumatic  Ear: TM normal bilaterally  Mouth: no erythema or exudates, MMM  Eyes: PERRL, EOMI, conjunctivae normal, No scleral icterus.  Neck: Supple, Trachea midline normal ROM, No JVD, mass, thyromegaly, or carotid bruit present.  Cardiovascular: RRR, S1 normal, S2 normal, no MRG, pulses symmetric and intact bilaterally  Pulmonary/Chest: CTAB, no wheezes, rales, or rhonchi  Porta cath to right chest wall. Abdominal: Soft. Non-tender, non-distended, bowel sounds are normal, no masses, organomegaly, or guarding present.  GU: no CVA tenderness Musculoskeletal: No joint deformities, erythema, or stiffness, ROM full and no nontender Ext: no edema and no cyanosis, pulses palpable bilaterally (DP and PT)  Hematology: no cervical, inginal, or axillary adenopathy.  Neurological: A&O x3, Strenght is normal and symmetric bilaterally, cranial nerve II-XII are grossly intact, no  focal motor deficit, sensory intact to light touch bilaterally.  Skin: Warm, dry and intact. No rash, cyanosis, or clubbing.  Psychiatric: Normal mood and affect. speech and behavior is normal. Judgment and thought content normal. Cognition and memory are normal.      Data Review   Micro Results No results found for this or any previous visit (from the past 240 hour(s)).  Radiology Reports Dg Chest 2 View  02/28/2016  CLINICAL DATA:  Acute onset of generalized weakness. Initial encounter. EXAM: CHEST  2 VIEW COMPARISON:  None. FINDINGS: The lungs are well-aerated. Small bilateral pleural effusions are noted. Vascular congestion is seen, with mildly increased interstitial markings, concerning for mild interstitial edema. There is no evidence of pneumothorax. The heart is mildly enlarged. A right-sided dual-lumen catheter is noted ending within the right atrium. No acute osseous abnormalities are seen. IMPRESSION: Small bilateral pleural effusions noted. Vascular congestion and mild cardiomegaly, with mildly increased interstitial markings, concerning for mild interstitial edema. Electronically Signed   By: Roanna Raider M.D.   On: 02/28/2016 22:33     CBC  Recent Labs Lab 02/28/16 2200  WBC 6.6  HGB 9.8*  HCT 30.2*  PLT 131*  MCV 88.6  MCH 28.7  MCHC 32.5  RDW 17.7*  LYMPHSABS 1.6  MONOABS 0.5  EOSABS 0.0  BASOSABS 0.0    Chemistries   Recent Labs Lab 02/28/16 2200  NA 138  K 3.7  CL 102  CO2 25  GLUCOSE 208*  BUN 30*  CREATININE 4.10*  CALCIUM 7.9*   ------------------------------------------------------------------------------------------------------------------ CrCl cannot be calculated (Unknown ideal weight.). ------------------------------------------------------------------------------------------------------------------ No results for input(s): HGBA1C in the last 72  hours. ------------------------------------------------------------------------------------------------------------------ No results for input(s): CHOL, HDL, LDLCALC, TRIG, CHOLHDL, LDLDIRECT in the last 72 hours. ------------------------------------------------------------------------------------------------------------------ No results for input(s): TSH, T4TOTAL, T3FREE, THYROIDAB in the last 72 hours.  Invalid input(s): FREET3 ------------------------------------------------------------------------------------------------------------------ No results for input(s): VITAMINB12, FOLATE, FERRITIN, TIBC, IRON, RETICCTPCT in the last 72 hours.  Coagulation profile No results for input(s): INR, PROTIME in the last 168 hours.  No results for input(s): DDIMER in the last 72 hours.  Cardiac Enzymes No results for input(s): CKMB, TROPONINI, MYOGLOBIN in the last 168 hours.  Invalid input(s): CK ------------------------------------------------------------------------------------------------------------------ Invalid input(s): POCBNP   CBG:  Recent Labs Lab 02/28/16 2030  GLUCAP 199*       EKG: Independently reviewed. Sinus rhythm with low voltage in extremity leads   Assessment/Plan Acute fluid overload secondary to end stage renal disease on HD: Patient recently reports missing dialysis secondary to car issues. Last dialysis session was 3/11. Nephrology was consulted and noted that the patient will be put on the list for dialysis in the a.m. - Admit to a telemetry bed - Hemodialysis per nephrology  Elevated BNP: On admission BNP greater than 4500. No echocardiogram on file for the patient - Strict ins and outs - investigate further if warranted with echo   Essential hypertension  - Continue Norvasc, lisinopril, HCTZ, metoprolol  Frequent falls: Patient reports weakness in lower extremities. His history of vitamin B12 deficiency - Physical therapy to eval and treat  Genella Rife -  protonix   Social issues: Including transportation, ability to care for self at home alone, and administer medications. - Social work consult    Code Status:   full Family Communication: bedside Disposition Plan: admit   Total time spent 55 minutes.Greater than 50% of this time was spent in counseling, explanation of diagnosis, planning of further management, and coordination of care  Clydie Braun [facility] Pager [phone]  If 7PM-7AM, please contact night-coverage [email or web address] Password [email or web address] 02/29/2016, 2:03 AM

## 2016-02-29 NOTE — Progress Notes (Signed)
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 He was found to be fluid overloaded and admitted for further evaluation.  Nephrology consulted and plan for HD today.  Followed by physical therapy evaluation and he will benefit from social worker consult to see if he can get assistance for transport to and from HD treatment. He was admitted earlier this am, and please Dr Katrinka BlazingSmith note for detailed H&P   Adreana Coull,MD

## 2016-02-29 NOTE — Care Management Obs Status (Signed)
MEDICARE OBSERVATION STATUS NOTIFICATION   Patient Details  Name: Jeffrey Walls MRN: [id number] Date of Birth: 1941-05-10   Medicare Observation Status Notification Given:  Yes (fluid over load observation )    Kingsley PlanWile, Jeremiah Tarpley Marie, RN 02/29/2016, 10:32 AM

## 2016-02-29 NOTE — Consult Note (Signed)
[facility]  Patient name: Jeffrey Walls MRN: [id number] DOB: 1941/03/03 Sex: male  REASON FOR CONSULT: permanent access  HPI: Jeffrey Walls is a 75 y.o. male, who presents for evaluation for permanent dialysis access. The patient is currently on dialysis via a right IJ tunneled dialysis catheter. He dialyzes on Tuesdays, Thursdays and Saturdays. The patient is right-handed. The patient was last seen by [facility] on 12/26/2015 for permanent access. Plans were made for a left fistula creation as an outpatient. On telephone follow-up, the patient stated that he had not made any decisions regarding a fistula yet. He also stated that his nephrologist at [facility] was referring him to a vascular surgeon in the [place] system.   The patient presents to  [facility] today for complaints of weakness associated with frequent falls and needing dialysis. He dialyzes in [place] but unfortunately was not able to have dialysis due to car issues. He reports that he has no method of transportation to and from dialysis. The patient lives alone. He has not had dialysis since Tuesday. The dialysis center in [place] would not accept him because he does not have a fistula.  He has a past medical history of CAD,  hypertension and insulin-dependent diabetes.   Past Medical History  Diagnosis Date  . Renal disorder   . Hypertension   . Diabetes mellitus without complication (HCC)   . Coronary artery disease   . Shortness of breath dyspnea     History reviewed. No pertinent family history.  SOCIAL HISTORY: Social History   Social History  . Marital Status: Divorced    Spouse Name: N/A  . Number of Children: N/A  . Years of Education: N/A   Occupational History  . Not on file.   Social History Main Topics  . Smoking status: Never Smoker   . Smokeless tobacco: Not on file  . Alcohol Use: No  . Drug Use: No  . Sexual Activity: Not on file   Other Topics Concern    . Not on file   Social History Narrative    Allergies  Allergen Reactions  . Ampicillin     Ankle swelling  . Benadryl [Diphenhydramine Hcl]     Rash    Current Facility-Administered Medications  Medication Dose Route Frequency Provider Last Rate Last Dose  . 0.9 %  sodium chloride infusion  100 mL Intravenous PRN Arita Miss, MD      . 0.9 %  sodium chloride infusion  100 mL Intravenous PRN Arita Miss, MD      . acetaminophen (TYLENOL) tablet 650 mg  650 mg Oral Q6H PRN Clydie Braun, MD       Or  . acetaminophen (TYLENOL) suppository 650 mg  650 mg Rectal Q6H PRN Clydie Braun, MD      . alteplase (CATHFLO ACTIVASE) injection 2 mg  2 mg Intracatheter Once PRN Arita Miss, MD      . amLODipine (NORVASC) tablet 10 mg  10 mg Oral Daily Clydie Braun, MD      . aspirin EC tablet 81 mg  81 mg Oral Daily Rondell A Katrinka Blazing, MD      . atorvastatin (LIPITOR) tablet 10 mg  10 mg Oral QPM Rondell A Katrinka Blazing, MD      . citalopram (CELEXA) tablet 20 mg  20 mg Oral Daily Rondell A Katrinka Blazing, MD      . Darbepoetin Alfa (ARANESP) injection 60 mcg  60 mcg  Intravenous Q Fri-HD Kathlen ModyVijaya Akula, MD      . fludrocortisone (FLORINEF) tablet 0.1 mg  0.1 mg Oral Daily Rondell Burtis JunesA Smith, MD      . folic acid (FOLVITE) tablet 1 mg  1 mg Oral Daily Rondell A Katrinka BlazingSmith, MD      . heparin injection 1,000 Units  1,000 Units Dialysis PRN Arita Missyan B Sanford, MD      . heparin injection 5,000 Units  5,000 Units Subcutaneous 3 times per day Clydie Braunondell A Smith, MD   5,000 Units at 02/29/16 1400  . lisinopril (PRINIVIL,ZESTRIL) tablet 20 mg  20 mg Oral Daily Rondell Burtis JunesA Smith, MD       And  . hydrochlorothiazide (MICROZIDE) capsule 12.5 mg  12.5 mg Oral Daily Rondell A Katrinka BlazingSmith, MD      . insulin aspart (novoLOG) injection 0-5 Units  0-5 Units Subcutaneous QHS Rondell A Smith, MD      . insulin aspart (novoLOG) injection 0-9 Units  0-9 Units Subcutaneous TID WC Clydie Braunondell A Smith, MD   1 Units at 02/29/16 0839  . insulin detemir  (LEVEMIR) injection 15 Units  15 Units Subcutaneous Daily Rondell A Smith, MD      . lidocaine (PF) (XYLOCAINE) 1 % injection 5 mL  5 mL Intradermal PRN Arita Missyan B Sanford, MD      . lidocaine-prilocaine (EMLA) cream 1 application  1 application Topical PRN Arita Missyan B Sanford, MD      . metoprolol succinate (TOPROL-XL) 24 hr tablet 50 mg  50 mg Oral Daily Rondell Burtis JunesA Smith, MD      . ondansetron (ZOFRAN) tablet 4 mg  4 mg Oral Q6H PRN Clydie Braunondell A Smith, MD       Or  . ondansetron (ZOFRAN) injection 4 mg  4 mg Intravenous Q6H PRN Clydie Braunondell A Smith, MD      . pantoprazole (PROTONIX) EC tablet 20 mg  20 mg Oral Daily Rondell Burtis JunesA Smith, MD      . pentafluoroprop-tetrafluoroeth (GEBAUERS) aerosol 1 application  1 application Topical PRN Arita Missyan B Sanford, MD      . potassium chloride (K-DUR,KLOR-CON) CR tablet 10 mEq  10 mEq Oral Daily Rondell A Katrinka BlazingSmith, MD      . sodium chloride flush (NS) 0.9 % injection 3 mL  3 mL Intravenous Q12H Rondell A Katrinka BlazingSmith, MD      . vitamin B-12 (CYANOCOBALAMIN) tablet 1,000 mcg  1,000 mcg Oral Daily Rondell A Katrinka BlazingSmith, MD      . zolpidem (AMBIEN) tablet 5 mg  5 mg Oral QHS PRN Clydie Braunondell A Smith, MD        REVIEW OF SYSTEMS:  [X]  denotes positive finding, [ ]  denotes negative finding Cardiac  Comments:  Chest pain or chest pressure:    Shortness of breath upon exertion: x   Short of breath when lying flat:    Irregular heart rhythm:        Vascular    Pain in calf, thigh, or hip brought on by ambulation:    Pain in feet at night that wakes you up from your sleep:     Blood clot in your veins:    Leg swelling:  x       Pulmonary    Oxygen at home:    Productive cough:     Wheezing:         Neurologic    Sudden weakness in arms or legs:     Sudden numbness in arms or legs:  Sudden onset of difficulty speaking or slurred speech:    Temporary loss of vision in one eye:     Problems with dizziness:         Gastrointestinal    Blood in stool:     Vomited blood:           Genitourinary    Burning when urinating:     Blood in urine:        Psychiatric    Major depression:         Hematologic    Bleeding problems:    Problems with blood clotting too easily:        Skin    Rashes or ulcers:        Constitutional    Fever or chills:      PHYSICAL EXAM: Filed Vitals:   02/29/16 1315 02/29/16 1400 02/29/16 1430 02/29/16 1500  BP: 173/95 147/83 138/78 156/88  Pulse: 86 88 85 85  Temp:      TempSrc:      Resp: Weight:      SpO2:        GENERAL: Patient seen in hemodialysis. The patient is a well-nourished male, in no acute distress. The vital signs are documented above. CARDIAC: There is a regular rate and rhythm.  VASCULAR: 2+ brachial and radial pulses bilaterally. Feet warm bilaterally. PULMONARY: Nonlabored respiratory effort. MUSCULOSKELETAL: Bilateral lower extremity swelling. NEUROLOGIC: No focal weakness or paresthesias are detected. SKIN: Abrasion to right ankle and foot. PSYCHIATRIC: The patient has a normal affect.  MEDICAL ISSUES:  ESRD on HD (TTS)  The patient is currently on dialysis via a right IJ tunneled dialysis catheter. The patient is agreeable to permanent access. He is right-handed. The patient lives in [place] and is concerned about being discharged prior to surgery as he does not have a method of transportation. Unfortunately, surgery will have to be delayed until early next week as it is the weekend. Obtain vein mapping. Plan for surgery early next week. Dr. Edilia Bo to evaluate patient.    Maris Berger, PA-C [facility] [phone]

## 2016-03-01 ENCOUNTER — Encounter (HOSPITAL_COMMUNITY): Payer: Medicare HMO

## 2016-03-01 DIAGNOSIS — N186 End stage renal disease: Secondary | ICD-10-CM

## 2016-03-01 LAB — CBC WITH DIFFERENTIAL/PLATELET
BASOS ABS: 0 10*3/uL (ref 0.0–0.1)
Basophils Relative: 0 %
EOS ABS: 0 10*3/uL (ref 0.0–0.7)
EOS PCT: 0 %
HCT: 25.4 % — ABNORMAL LOW (ref 39.0–52.0)
Hemoglobin: 8.2 g/dL — ABNORMAL LOW (ref 13.0–17.0)
LYMPHS PCT: 35 %
Lymphs Abs: 1 10*3/uL (ref 0.7–4.0)
MCH: 28.7 pg (ref 26.0–34.0)
MCHC: 32.3 g/dL (ref 30.0–36.0)
MCV: 88.8 fL (ref 78.0–100.0)
MONO ABS: 0.2 10*3/uL (ref 0.1–1.0)
Monocytes Relative: 8 %
Neutro Abs: 1.6 10*3/uL — ABNORMAL LOW (ref 1.7–7.7)
Neutrophils Relative %: 56 %
PLATELETS: 108 10*3/uL — AB (ref 150–400)
RBC: 2.86 MIL/uL — ABNORMAL LOW (ref 4.22–5.81)
RDW: 18 % — AB (ref 11.5–15.5)
WBC: 2.8 10*3/uL — ABNORMAL LOW (ref 4.0–10.5)

## 2016-03-01 LAB — RENAL FUNCTION PANEL
Albumin: 1.8 g/dL — ABNORMAL LOW (ref 3.5–5.0)
Anion gap: 10 (ref 5–15)
BUN: 19 mg/dL (ref 6–20)
CHLORIDE: 104 mmol/L (ref 101–111)
CO2: 25 mmol/L (ref 22–32)
CREATININE: 2.71 mg/dL — AB (ref 0.61–1.24)
Calcium: 7.1 mg/dL — ABNORMAL LOW (ref 8.9–10.3)
GFR calc Af Amer: 25 mL/min — ABNORMAL LOW (ref >60)
GFR, EST NON AFRICAN AMERICAN: 22 mL/min — AB (ref >60)
Glucose, Bld: 146 mg/dL — ABNORMAL HIGH (ref 65–99)
Phosphorus: 2.8 mg/dL (ref 2.5–4.6)
Potassium: 3.4 mmol/L — ABNORMAL LOW (ref 3.5–5.1)
Sodium: 139 mmol/L (ref 135–145)

## 2016-03-01 LAB — GLUCOSE, CAPILLARY
GLUCOSE-CAPILLARY: 111 mg/dL — AB (ref 65–99)
Glucose-Capillary: 165 mg/dL — ABNORMAL HIGH (ref 65–99)
Glucose-Capillary: 125 mg/dL — ABNORMAL HIGH (ref 65–99)
Glucose-Capillary: 171 mg/dL — ABNORMAL HIGH (ref 65–99)

## 2016-03-01 NOTE — Progress Notes (Signed)
Patient back from dialysis.  Alert and oriented.  Pleasant.

## 2016-03-01 NOTE — Progress Notes (Signed)
[facility] PROGRESS NOTE  Christianne Dolindgar Kitson JXB:[id number] DOB: 1941-06-16 DOA: 02/28/2016  PCP: Theressa StampsHADLEY,ALEXANDER, MD  Brief HPI: 75 year old Caucasian male with a past medical history of diabetes, hypertension, coronary artery disease and end-stage renal disease on dialysis, presented with complaints of weakness, shortness of breath. Due to transportation issues, patient had not gone for dialysis in many days. Patient was found to have interstitial edema on chest x-ray. Pleural effusions were noted. Patient was admitted for further management.  Past medical history:  Past Medical History  Diagnosis Date  . Renal disorder   . Hypertension   . Diabetes mellitus without complication (HCC)   . Coronary artery disease   . Shortness of breath dyspnea     Consultants: Nephrology  Procedures: Dialysis  Antibiotics: None  Subjective: Patient feels better. Denies any chest pain, shortness of breath. Legs are still quite swollen. No nausea, vomiting.  Objective: Vital Signs  Filed Vitals:   03/01/16 1000 03/01/16 1030 03/01/16 1100 03/01/16 1115  BP: 90/42 92/47 99/54  101/51  Pulse: 69 69 70 73  Temp:    98.6 F (37 C)  TempSrc:    Oral  Resp:    20  Weight:    57.1 kg (125 lb 14.1 oz)  SpO2:    96%    Intake/Output Summary (Last 24 hours) at 03/01/16 1152 Last data filed at 03/01/16 1115  Gross per 24 hour  Intake     60 ml  Output   4431 ml  Net  -4371 ml   Filed Weights   02/29/16 2030 03/01/16 0714 03/01/16 1115  Weight: 58.2 kg (128 lb 4.9 oz) 59.4 kg (130 lb 15.3 oz) 57.1 kg (125 lb 14.1 oz)    General appearance: alert, cooperative, appears stated age and no distress Resp: Diminished air entry at the bases. No definite wheezing or rhonchi. Few crackles. Cardio: regular rate and rhythm, S1, S2 normal, no murmur, click, rub or gallop GI: soft, non-tender; bowel sounds normal; no masses,  no organomegaly Extremities: 2+ pitting edema bilateral lower  extremities Neurologic: Awake and alert. Oriented 3. No focal deficits.  Lab Results:  Basic Metabolic Panel:  Recent Labs Lab 02/28/16 2200 02/29/16 0700 02/29/16 1340 03/01/16 0755  NA 138  --  139 139  K 3.7 3.4* 3.6 3.4*  CL 102  --  104 104  CO2 25  --  28 25  GLUCOSE 208*  --  204* 146*  BUN 30*  --  32* 19  CREATININE 4.10*  --  4.04* 2.71*  CALCIUM 7.9*  --  7.6* 7.1*  PHOS  --   --  3.9 2.8   Liver Function Tests:  Recent Labs Lab 02/29/16 1340 03/01/16 0755  ALBUMIN 2.0* 1.8*   CBC:  Recent Labs Lab 02/28/16 2200 02/29/16 1339 03/01/16 0755  WBC 6.6 7.2 2.8*  NEUTROABS 4.6  --  1.6*  HGB 9.8* 8.5* 8.2*  HCT 30.2* 27.1* 25.4*  MCV 88.6 88.6 88.8  PLT 131* 128* 108*   BNP (last 3 results)  Recent Labs  02/28/16 2200  BNP >4500.0*    CBG:  Recent Labs Lab 02/29/16 0755 02/29/16 1129 02/29/16 1752 02/29/16 2030 03/01/16 0932  GLUCAP 124* 180* 108* 216* 111*    Recent Results (from the past 240 hour(s))  MRSA PCR Screening     Status: None   Collection Time: 02/29/16  4:33 AM  Result Value Ref Range Status   MRSA by PCR NEGATIVE NEGATIVE Final  Comment:        The GeneXpert MRSA Assay (FDA approved for NASAL specimens only), is one component of a comprehensive MRSA colonization surveillance program. It is not intended to diagnose MRSA infection nor to guide or monitor treatment for MRSA infections.       Studies/Results: Dg Chest 2 View  02/28/2016  CLINICAL DATA:  Acute onset of generalized weakness. Initial encounter. EXAM: CHEST  2 VIEW COMPARISON:  None. FINDINGS: The lungs are well-aerated. Small bilateral pleural effusions are noted. Vascular congestion is seen, with mildly increased interstitial markings, concerning for mild interstitial edema. There is no evidence of pneumothorax. The heart is mildly enlarged. A right-sided dual-lumen catheter is noted ending within the right atrium. No acute osseous abnormalities  are seen. IMPRESSION: Small bilateral pleural effusions noted. Vascular congestion and mild cardiomegaly, with mildly increased interstitial markings, concerning for mild interstitial edema. Electronically Signed   By: Roanna Raider M.D.   On: 02/28/2016 22:33    Medications:  Scheduled: . aspirin EC  81 mg Oral Daily  . atorvastatin  10 mg Oral QPM  . citalopram  20 mg Oral Daily  . darbepoetin (ARANESP) injection - DIALYSIS  60 mcg Intravenous Q Fri-HD  . fludrocortisone  0.1 mg Oral Daily  . folic acid  1 mg Oral Daily  . heparin  5,000 Units Subcutaneous 3 times per day  . insulin aspart  0-5 Units Subcutaneous QHS  . insulin aspart  0-9 Units Subcutaneous TID WC  . insulin detemir  15 Units Subcutaneous Daily  . lisinopril  20 mg Oral Daily  . metoprolol succinate  50 mg Oral Daily  . pantoprazole  20 mg Oral Daily  . potassium chloride  10 mEq Oral Daily  . sodium chloride flush  3 mL Intravenous Q12H  . vitamin B-12  1,000 mcg Oral Daily   Continuous:  UEA:[id number] **OR** acetaminophen, ondansetron **OR** ondansetron (ZOFRAN) IV, zolpidem  Assessment/Plan:  Principal Problem:   Volume overload Active Problems:   Hypertension   ESRD on hemodialysis (HCC)   Fluid overload   Frequent falls    Acute fluid overload secondary to end stage renal disease on HD Patient had to miss dialysis due to transportation issues. Last dialysis prior to this hospitalization was March 11. Nephrology is following. He was dialyzed 3/17 and is being dialyzed 3/18. Further plan per nephrology. They are planning to have some kind of permanent access placed so that he will have easier access to dialysis as outpatient. Patient still has significant pedal edema.   Essential hypertension  Blood pressures were low during dialysis. Patient is on multiple antihypertensive medications. For now, we will discontinue the Norvasc and HCTZ. Due to continued need for dialysis we may have to continue to  hold some of his antihypertensive agents are pending on his blood pressure. Monitor for now.   Frequent falls Patient reports weakness in lower extremities. Has history of vitamin B12 deficiency. Physical therapy to eval and treat. No focal neurological deficit is appreciated on examination.  Genella Rife Protonix   Social issues Including transportation, ability to care for self at home alone, and administer medications. Social work consult. PT evaluation   DVT Prophylaxis: Subcutaneous heparin    Code Status: Full code  Family Communication: Discussed with the patient  Disposition Plan: Continue management as outlined above.    LOS: 1 day   [facility]  [facility] Pager [phone] 03/01/2016, 11:52 AM  If 7PM-7AM, please contact night-coverage at [email or web address], password [facility]

## 2016-03-01 NOTE — Procedures (Signed)
I was present at this dialysis session. I have reviewed the session itself and made appropriate changes.   Getting back on schedule today.  Appreciate prompt VVS eval for AVF/AVG.  Tentative will look for AVF/AVG next week and transition [facility].    HD yesterday with 3L UF via TDC.  More UF today given edema, EDW TBD.  4K bath.    Req records from [place] [facility].  High Ferritin, nl TSAT. No IV Fe.  [name] Oneka Parada  MD 03/01/2016, 8:49 AM

## 2016-03-01 NOTE — Evaluation (Signed)
Physical Therapy Evaluation Patient Details Name: Jeffrey Walls MRN: [id number] DOB: 1941-03-10 Today's Date: 03/01/2016   History of Present Illness  Patient is a 75 yo male admitted 02/28/16 with weakness, SOB.  Patient with volume overload - missed HD appointments.   PMH:  ESRD on HD, DM, HTN, CAD, frequent falls    Clinical Impression  Patient presents with problems listed below.  Will benefit from acute PT to maximize functional mobility prior to discharge.  Recommend ST-SNF at discharge for continued therapy.    Follow Up Recommendations SNF;Supervision for mobility/OOB    Equipment Recommendations  None recommended by PT    Recommendations for Other Services       Precautions / Restrictions Precautions Precautions: Fall Precaution Comments: Per chart, frequent falls at home. Restrictions Weight Bearing Restrictions: No      Mobility  Bed Mobility Overal bed mobility: Needs Assistance Bed Mobility: Supine to Sit;Sit to Supine     Supine to sit: Min assist Sit to supine: Min assist   General bed mobility comments: Assist to raise trunk to upright position.  Patient with good balance in sitting.  Fatigued quickly.  Returned to supine with assist to bring LE's onto bed.  Transfers                 General transfer comment: Patient declined due to fatigue - HD this am.  Ambulation/Gait                Stairs            Wheelchair Mobility    Modified Rankin (Stroke Patients Only)       Balance                                             Pertinent Vitals/Pain Pain Assessment: Faces Faces Pain Scale: Hurts little more Pain Location: LE's Pain Descriptors / Indicators: Sore;Tender Pain Intervention(s): Limited activity within patient's tolerance;Monitored during session;Repositioned    Home Living Family/patient expects to be discharged to:: Private residence Living Arrangements: Alone Available Help at Discharge:  Personal care attendant;Available PRN/intermittently (Provides assist with medication management only) Type of Home: House Home Access: Stairs to enter Entrance Stairs-Rails: None Entrance Stairs-Number of Steps: 2 Home Layout: One level Home Equipment: Walker - 2 wheels;Cane - single point;Shower seat      Prior Function Level of Independence: Independent with assistive device(s)         Comments: Uses cane for ambulation.  Drives self to HD.     Hand Dominance   Dominant Hand: Right    Extremity/Trunk Assessment   Upper Extremity Assessment: Generalized weakness           Lower Extremity Assessment: Generalized weakness (Noted edema BLE's)         Communication   Communication: No difficulties  Cognition Arousal/Alertness: Awake/alert Behavior During Therapy: Flat affect;Agitated Overall Cognitive Status: Within Functional Limits for tasks assessed                      General Comments      Exercises        Assessment/Plan    PT Assessment Patient needs continued PT services  PT Diagnosis Difficulty walking;Generalized weakness;Acute pain   PT Problem List Decreased strength;Decreased activity tolerance;Decreased balance;Decreased mobility;Decreased knowledge of use of DME;Pain  PT Treatment Interventions DME instruction;Gait training;Functional mobility  training;Therapeutic activities;Stair training;Therapeutic exercise;Patient/family education   PT Goals (Current goals can be found in the Care Plan section) Acute Rehab PT Goals Patient Stated Goal: None stated PT Goal Formulation: With patient Time For Goal Achievement: 03/08/16 Potential to Achieve Goals: Good    Frequency Min 3X/week   Barriers to discharge Decreased caregiver support Patient lives alone    Co-evaluation               End of Session   Activity Tolerance: Patient limited by fatigue;Patient limited by pain Patient left: in bed;with call bell/phone within  reach Nurse Communication: Mobility status;Other (comment) (Wanted bandaid for Rt ear (sore) - provided to patient)         Time: 1610-9604 PT Time Calculation (min) (ACUTE ONLY): 12 min   Charges:   PT Evaluation $PT Eval Moderate Complexity: 1 Procedure     PT G CodesVena Austria 2016/03/15, 10:37 PM Durenda Hurt. Renaldo Fiddler, [facility] Acute Rehab Services Pager [phone]

## 2016-03-02 ENCOUNTER — Inpatient Hospital Stay (HOSPITAL_COMMUNITY): Payer: Medicare HMO

## 2016-03-02 DIAGNOSIS — N186 End stage renal disease: Secondary | ICD-10-CM

## 2016-03-02 DIAGNOSIS — Z992 Dependence on renal dialysis: Secondary | ICD-10-CM

## 2016-03-02 DIAGNOSIS — E8770 Fluid overload, unspecified: Secondary | ICD-10-CM

## 2016-03-02 LAB — CBC
HEMATOCRIT: 27.1 % — AB (ref 39.0–52.0)
HEMOGLOBIN: 8.5 g/dL — AB (ref 13.0–17.0)
MCH: 28.1 pg (ref 26.0–34.0)
MCHC: 31.4 g/dL (ref 30.0–36.0)
MCV: 89.4 fL (ref 78.0–100.0)
Platelets: 136 10*3/uL — ABNORMAL LOW (ref 150–400)
RBC: 3.03 MIL/uL — ABNORMAL LOW (ref 4.22–5.81)
RDW: 18.2 % — ABNORMAL HIGH (ref 11.5–15.5)
WBC: 5.5 10*3/uL (ref 4.0–10.5)

## 2016-03-02 LAB — BASIC METABOLIC PANEL
Anion gap: 9 (ref 5–15)
BUN: 15 mg/dL (ref 6–20)
CHLORIDE: 100 mmol/L — AB (ref 101–111)
CO2: 30 mmol/L (ref 22–32)
CREATININE: 2.74 mg/dL — AB (ref 0.61–1.24)
Calcium: 7.7 mg/dL — ABNORMAL LOW (ref 8.9–10.3)
GFR calc Af Amer: 25 mL/min — ABNORMAL LOW (ref >60)
GFR calc non Af Amer: 21 mL/min — ABNORMAL LOW (ref >60)
GLUCOSE: 127 mg/dL — AB (ref 65–99)
Potassium: 4.4 mmol/L (ref 3.5–5.1)
Sodium: 139 mmol/L (ref 135–145)

## 2016-03-02 LAB — GLUCOSE, CAPILLARY
GLUCOSE-CAPILLARY: 107 mg/dL — AB (ref 65–99)
GLUCOSE-CAPILLARY: 117 mg/dL — AB (ref 65–99)
Glucose-Capillary: 113 mg/dL — ABNORMAL HIGH (ref 65–99)
Glucose-Capillary: 146 mg/dL — ABNORMAL HIGH (ref 65–99)

## 2016-03-02 NOTE — Progress Notes (Signed)
Admit: 02/28/2016 LOS: 2  34M with frequent falls, progressive debility, lives alone w/o much support, ESRD tolerating poorly. Has volume overload  Subjective:  Very dissatisfied with breakfast this AM  HD yesterday, back on schedule, not full UF 2/2 cramping, got 1.4L SNF recommended by PT VVS following Post weight 57.1kg   03/18 0701 - 03/19 0700 In: 360 [P.O.:360] Out: 1406 [Urine:25]  Filed Weights   03/01/16 0714 03/01/16 1115 03/01/16 2016  Weight: 59.4 kg (130 lb 15.3 oz) 57.1 kg (125 lb 14.1 oz) 57.3 kg (126 lb 5.2 oz)    Scheduled Meds: . aspirin EC  81 mg Oral Daily  . atorvastatin  10 mg Oral QPM  . citalopram  20 mg Oral Daily  . darbepoetin (ARANESP) injection - DIALYSIS  60 mcg Intravenous Q Fri-HD  . fludrocortisone  0.1 mg Oral Daily  . folic acid  1 mg Oral Daily  . heparin  5,000 Units Subcutaneous 3 times per day  . insulin aspart  0-5 Units Subcutaneous QHS  . insulin aspart  0-9 Units Subcutaneous TID WC  . insulin detemir  15 Units Subcutaneous Daily  . lisinopril  20 mg Oral Daily  . metoprolol succinate  50 mg Oral Daily  . pantoprazole  20 mg Oral Daily  . potassium chloride  10 mEq Oral Daily  . sodium chloride flush  3 mL Intravenous Q12H  . vitamin B-12  1,000 mcg Oral Daily   Continuous Infusions:  PRN Meds:.acetaminophen **OR** acetaminophen, ondansetron **OR** ondansetron (ZOFRAN) IV, zolpidem  Current Labs: reviewed    Physical Exam:  Blood pressure 137/80, pulse 77, temperature 99 F (37.2 C), temperature source Oral, resp. rate 18, weight 57.3 kg (126 lb 5.2 oz), SpO2 92 %. GEN: NAD ENT: NCAT, poor dentition EYES: EOMI CV: RRR, no rub PULM: CTAB ABD: s/nt/nd SKIN: no rashes/lesions; R IJ TDC YNW:GNFAOEXT:trace -1+ LEE  A 1. ESRD 1. [place], [place] 2. Not accepted to [place] -- no AVF; has been encouraged to do so -- willing to pursue and VVS following, hopefully early this  week 3. Using R IJ Tunneled Cath 4. Cont on THS schedule currently 2. Debility / Falls 3. 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 HTN/Vol on CCB, ACEi, Thiazide, MTP 6. CKD-BMD 7. Anemia on aranesp qFri; High ferritin so no IV Fe right now 8. Hypoalbuminemia 9. Chronic LEE improving   P 1. Cont on THS schedule 2. Gradually bring down post weights given edema -- already improved 3. Await AVF; if placed [facility] 4. SNF eval pending; not sure he'll be willing   [name] Bertie Mcconathy MD 03/02/2016, 8:17 AM   Recent Labs Lab 02/29/16 1340 03/01/16 0755 03/02/16 0600  NA 139 139 139  K 3.6 3.4* 4.4  CL 104 104 100*  CO2 28 25 30   GLUCOSE 204* 146* 127*  BUN 32* 19 15  CREATININE 4.04* 2.71* 2.74*  CALCIUM 7.6* 7.1* 7.7*  PHOS 3.9 2.8  --     Recent Labs Lab 02/28/16 2200 02/29/16 1339 03/01/16 0755 03/02/16 0600  WBC 6.6 7.2 2.8* 5.5  NEUTROABS 4.6  --  1.6*  --   HGB 9.8* 8.5* 8.2* 8.5*  HCT 30.2* 27.1* 25.4* 27.1*  MCV 88.6 88.6 88.8 89.4  PLT 131* 128* 108* 136*
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
## 2016-03-02 NOTE — Progress Notes (Signed)
Right  Upper Extremity Vein Map    Cephalic  Segment Diameter Depth Comment  1. Axilla 2.201mm 2.674mm   2. Mid upper arm 2.449mm 3.632mm   3. Above AC 2.322mm 3.739mm   4. In AC 2.555mm 2.566mm   5. Below AC 1.57mm 1.412mm   6. Mid forearm 1.315mm 2.163mm   7. Wrist 2.1047mm 1.474mm    mm mm    mm mm    mm mm     Left Upper Extremity Vein Map    Cephalic  Segment Diameter Depth Comment  1. Axilla 4mm 1.429mm   2. Mid upper arm 2.29mm 2.924mm   3. Above AC 2.319mm 2.173mm   4. In AC 2.155mm 3.384mm   5. Below AC 3.897mm 2mm   6. Mid forearm 2.707mm 2.446mm Multiple branches  7. Wrist 2.601mm 1.665mm    mm mm    mm mm    mm mm

## 2016-03-02 NOTE — Progress Notes (Signed)
   VASCULAR SURGERY ASSESSMENT & PLAN:  * Vein map pending.  * Plan Left AVF/AVG Tuesday. I discussed the procedure and risks with the patient today and he is agreeable to proceed on Tuesday.   SUBJECTIVE: No specific complaints.  PHYSICAL EXAM: Filed Vitals:   03/01/16 1729 03/01/16 2016 03/02/16 0644 03/02/16 0810  BP: 119/36 118/42 134/67 137/80  Pulse: 78 81 70 77  Temp: 100.3 F (37.9 C) 99.9 F (37.7 C) 98.6 F (37 C) 99 F (37.2 C)  TempSrc: Oral Oral Oral Oral  Resp: 19 20 18 18   Weight:  126 lb 5.2 oz (57.3 kg)    SpO2: 94% 95% 95% 92%   Palpable left radial pulse.  LABS: Lab Results  Component Value Date   WBC 5.5 03/02/2016   HGB 8.5* 03/02/2016   HCT 27.1* 03/02/2016   MCV 89.4 03/02/2016   PLT 136* 03/02/2016   Lab Results  Component Value Date   CREATININE 2.74* 03/02/2016   Lab Results  Component Value Date   INR 1.29 12/25/2015   CBG (last 3)   Recent Labs  03/01/16 1726 03/01/16 2015 03/02/16 0810  GLUCAP 125* 171* 113*    Principal Problem:   Volume overload Active Problems:   Hypertension   ESRD on hemodialysis (HCC)   Fluid overload   Frequent falls   Cari CarawayChris Jorah Hua Beeper: [phone] 03/02/2016

## 2016-03-03 DIAGNOSIS — I10 Essential (primary) hypertension: Secondary | ICD-10-CM

## 2016-03-03 DIAGNOSIS — R296 Repeated falls: Secondary | ICD-10-CM

## 2016-03-03 LAB — GLUCOSE, CAPILLARY
GLUCOSE-CAPILLARY: 62 mg/dL — AB (ref 65–99)
GLUCOSE-CAPILLARY: 65 mg/dL (ref 65–99)
GLUCOSE-CAPILLARY: 94 mg/dL (ref 65–99)
Glucose-Capillary: 120 mg/dL — ABNORMAL HIGH (ref 65–99)
Glucose-Capillary: 74 mg/dL (ref 65–99)
Glucose-Capillary: 93 mg/dL (ref 65–99)

## 2016-03-03 LAB — CBC
HCT: 27.9 % — ABNORMAL LOW (ref 39.0–52.0)
Hemoglobin: 9.2 g/dL — ABNORMAL LOW (ref 13.0–17.0)
MCH: 28.7 pg (ref 26.0–34.0)
MCHC: 33 g/dL (ref 30.0–36.0)
MCV: 86.9 fL (ref 78.0–100.0)
PLATELETS: 146 10*3/uL — AB (ref 150–400)
RBC: 3.21 MIL/uL — ABNORMAL LOW (ref 4.22–5.81)
RDW: 17.6 % — AB (ref 11.5–15.5)
WBC: 6.6 10*3/uL (ref 4.0–10.5)

## 2016-03-03 LAB — RENAL FUNCTION PANEL
ALBUMIN: 2 g/dL — AB (ref 3.5–5.0)
Anion gap: 10 (ref 5–15)
BUN: 28 mg/dL — AB (ref 6–20)
CALCIUM: 7.6 mg/dL — AB (ref 8.9–10.3)
CO2: 24 mmol/L (ref 22–32)
Chloride: 103 mmol/L (ref 101–111)
Creatinine, Ser: 3.65 mg/dL — ABNORMAL HIGH (ref 0.61–1.24)
GFR calc Af Amer: 18 mL/min — ABNORMAL LOW (ref >60)
GFR calc non Af Amer: 15 mL/min — ABNORMAL LOW (ref >60)
GLUCOSE: 65 mg/dL (ref 65–99)
PHOSPHORUS: 2.9 mg/dL (ref 2.5–4.6)
Potassium: 3.8 mmol/L (ref 3.5–5.1)
SODIUM: 137 mmol/L (ref 135–145)

## 2016-03-03 LAB — SURGICAL PCR SCREEN
MRSA, PCR: NEGATIVE
STAPHYLOCOCCUS AUREUS: NEGATIVE

## 2016-03-03 MED ORDER — RENA-VITE PO TABS
1.0000 | ORAL_TABLET | Freq: Every day | ORAL | Status: DC
  Administered 2016-03-03 – 2016-03-05 (×3): 1 via ORAL
  Filled 2016-03-03 (×3): qty 1

## 2016-03-03 MED ORDER — VANCOMYCIN HCL IN DEXTROSE 1-5 GM/200ML-% IV SOLN
1000.0000 mg | INTRAVENOUS | Status: AC
  Filled 2016-03-03: qty 200

## 2016-03-03 MED ORDER — CEFAZOLIN SODIUM 1-5 GM-% IV SOLN: 1.0000 g | INTRAVENOUS | Status: DC

## 2016-03-03 MED ORDER — DEXTROSE 50 % IV SOLN
INTRAVENOUS | Status: AC
  Filled 2016-03-03: qty 50

## 2016-03-03 NOTE — Procedures (Signed)
Patient seen on Hemodialysis. QB 400, UF goal 2.5L Treatment adjusted as needed.  Jeffrey BillsJay Lani Havlik MD [facility]. Office # [phone] Pager # [phone] 2:58 PM

## 2016-03-03 NOTE — Progress Notes (Signed)
PT Cancellation Note  Patient Details Name: Jeffrey Walls MRN: [id number] DOB: 1941/06/19   Cancelled Treatment:    Reason Eval/Treat Not Completed: Other (comment) (declined stating he feels very poorly).  Will try later if time allows.   Ivar DrapeStout, Bruna Dills E 03/03/2016, 11:57 AM   Samul Dadauth Foye Haggart, PT MS Acute Rehab Dept. Number: [facility] [id number] and [facility] [phone]

## 2016-03-03 NOTE — Progress Notes (Addendum)
Patient ID: Jeffrey Walls, male   DOB: 25-Apr-1941, 75 y.o.   MRN: [id number]  [facility] Progress Note   Assessment/ Plan:   1. ESRD: With significant difficulty getting to and from [facility]) for hemodialysis but now committed to trying to get into dialysis center at [facility] that involves placement of permanent dialysis access. It appears that he is on schedule for AV fistula creation tomorrow. Usually on Tuesday/Thursday/Saturday dialysis schedule-will dialyze today in anticipation for surgery tomorrow and give him the day off tomorrow. The process has been initiated to try and get hemodialysis spot in [place] 2. Anemia: Low hemoglobin, no overt loss, on subcutaneous Aranesp, continue to follow trend. No indications for PRBCs. 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 Nutrition: Appears chronically malnourished, continue renal vitamin and oral nutritional supplements. 5. Hypertension: Blood pressures acceptable limits, monitor with ultrafiltration/hemodialysis.  Subjective:   Reports to be feeling fair-complains about the quality of food here and looking forward to access surgery tomorrow.    Objective:   BP 139/64 mmHg  Pulse 75  Temp(Src) 98.2 F (36.8 C) (Oral)  Resp 18  Wt 57.6 kg (126 lb 15.8 oz)  SpO2 98%  Physical Exam: WUJ:[id number] resting in bed CVS: Pulse regular in rate and rhythm, S1 and S2 normal Resp: Clear to auscultation, no rales Abd: Soft, flat, nontender Ext: 2-3+ lower extremity edema  Labs: BMET  Recent Labs Lab 02/28/16 2200 02/29/16 0700 02/29/16 1340 03/01/16 0755 03/02/16 0600 03/03/16 0704  NA 138  --  139 139 139 137  K 3.7 3.4* 3.6 3.4* 4.4 3.8  CL 102  --  104 104 100* 103  CO2 25  --  GLUCOSE 208*  --  204* 146* 127* 65  BUN 30*  --  32* 19 15 28*  CREATININE 4.10*  --  4.04* 2.71* 2.74* 3.65*  CALCIUM 7.9*  --  7.6*  7.1* 7.7* 7.6*  PHOS  --   --  3.9 2.8  --  2.9   CBC  Recent Labs Lab 02/28/16 2200 02/29/16 1339 03/01/16 0755 03/02/16 0600 03/03/16 0704  WBC 6.6 7.2 2.8* 5.5 6.6  NEUTROABS 4.6  --  1.6*  --   --   HGB 9.8* 8.5* 8.2* 8.5* 9.2*  HCT 30.2* 27.1* 25.4* 27.1* 27.9*  MCV 88.6 88.6 88.8 89.4 86.9  PLT 131* 128* 108* 136* 146*   Medications:    . aspirin EC  81 mg Oral Daily  . atorvastatin  10 mg Oral QPM  . [START ON 03/04/2016]  ceFAZolin (ANCEF) IV  1 g Intravenous To SSTC  . citalopram  20 mg Oral Daily  . darbepoetin (ARANESP) injection - DIALYSIS  60 mcg Intravenous Q Fri-HD  . dextrose      . fludrocortisone  0.1 mg Oral Daily  . folic acid  1 mg Oral Daily  . heparin  5,000 Units Subcutaneous 3 times per day  . insulin aspart  0-5 Units Subcutaneous QHS  . insulin aspart  0-9 Units Subcutaneous TID WC  . insulin detemir  15 Units Subcutaneous Daily  . lisinopril  20 mg Oral Daily  . metoprolol succinate  50 mg Oral Daily  . pantoprazole  20 mg Oral Daily  . sodium chloride flush  3 mL Intravenous Q12H  . [START ON 03/04/2016] vancomycin  1,000 mg Intravenous To SS-Surg  . vitamin B-12  1,000 mcg Oral Daily  Zetta BillsJay Mory Herrman, MD 03/03/2016, 11:55 AM

## 2016-03-03 NOTE — Progress Notes (Signed)
Hypoglycemic Event  CBG: 65  Treatment: 15 GM carbohydrate snack  Symptoms: None  Follow-up CBG: Time:0909 CBG Result:94  Possible Reasons for Event: Unknown  Comments/MD notified:Dr. Izola PriceMyers aware    Theadora RamaKIRKMAN, Chenelle Benning Brooke
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
## 2016-03-03 NOTE — Care Management Important Message (Signed)
Important Message  Patient Details  Name: Jeffrey Walls MRN: [id number] Date of Birth: [birth year]   Medicare Important Message Given:  Yes    Jeffrey Walls 03/03/2016, 4:31 PM

## 2016-03-03 NOTE — Progress Notes (Signed)
Hypoglycemic Event  CBG: 62  Treatment: 15 GM carbohydrate snack  Symptoms: None  Follow-up CBG: [id number]:0835 CBG Result:65  Possible Reasons for Event: Unknown  Comments/MD notified:Dr. Izola PriceMyers notified at the bedside.    Theadora RamaKIRKMAN, Jeffrey Walls

## 2016-03-03 NOTE — Progress Notes (Signed)
   VASCULAR SURGERY ASSESSMENT & PLAN:  * Plan for left AV fistula or AV graft tomorrow. Looking at his vein map it appears that he will be a candidate for a fistula.    SUBJECTIVE: Resting comfortably.  PHYSICAL EXAM: Filed Vitals:   03/02/16 0810 03/02/16 1637 03/02/16 1959 03/03/16 0633  BP: 137/80 125/65 121/53 160/73  Pulse: 77 62 72 63  Temp: 99 F (37.2 C) 98.5 F (36.9 C) 98.4 F (36.9 C)   TempSrc: Oral Oral Oral   Resp: 18 19 20 19  Weight:   126 lb 15.8 oz (57.6 kg)   SpO2: 92% 96% 97% 98%   No change in exam.  LABS: Lab Results  Component Value Date   WBC 5.5 03/02/2016   HGB 8.5* 03/02/2016   HCT 27.1* 03/02/2016   MCV 89.4 03/02/2016   PLT 136* 03/02/2016   Lab Results  Component Value Date   CREATININE 2.74* 03/02/2016   Lab Results  Component Value Date   INR 1.29 12/25/2015   CBG (last 3)   Recent Labs  03/02/16 1137 03/02/16 1636 03/02/16 1959  GLUCAP 146* 107* 117*   VEIN MAP: His upper arm cephalic vein appears to be reasonable in size for potentially a left brachiocephalic AV fistula.   Principal Problem:   Volume overload Active Problems:   Hypertension   ESRD on hemodialysis (HCC)   Fluid overload   Frequent falls   Chris Dickson Beeper: [phone] 03/03/2016    

## 2016-03-04 ENCOUNTER — Inpatient Hospital Stay (HOSPITAL_COMMUNITY): Payer: Medicare HMO | Admitting: Certified Registered Nurse Anesthetist

## 2016-03-04 ENCOUNTER — Encounter (HOSPITAL_COMMUNITY): Payer: Self-pay | Admitting: Certified Registered Nurse Anesthetist

## 2016-03-04 ENCOUNTER — Encounter (HOSPITAL_COMMUNITY)
Admission: EM | Disposition: A | Discharge status: Skilled Nursing Facility | Payer: Self-pay | Source: Home / Self Care | Attending: Internal Medicine

## 2016-03-04 ENCOUNTER — Other Ambulatory Visit: Payer: Self-pay | Admitting: *Deleted

## 2016-03-04 DIAGNOSIS — Z4931 Encounter for adequacy testing for hemodialysis: Secondary | ICD-10-CM

## 2016-03-04 DIAGNOSIS — E1121 Type 2 diabetes mellitus with diabetic nephropathy: Secondary | ICD-10-CM

## 2016-03-04 DIAGNOSIS — N189 Chronic kidney disease, unspecified: Secondary | ICD-10-CM

## 2016-03-04 DIAGNOSIS — D61818 Other pancytopenia: Secondary | ICD-10-CM | POA: Diagnosis present

## 2016-03-04 DIAGNOSIS — Z794 Long term (current) use of insulin: Secondary | ICD-10-CM

## 2016-03-04 DIAGNOSIS — I10 Essential (primary) hypertension: Secondary | ICD-10-CM | POA: Diagnosis present

## 2016-03-04 HISTORY — PX: AV FISTULA PLACEMENT: SHX1204

## 2016-03-04 LAB — RENAL FUNCTION PANEL
Albumin: 2.3 g/dL — ABNORMAL LOW (ref 3.5–5.0)
Anion gap: 12 (ref 5–15)
BUN: 18 mg/dL (ref 6–20)
CHLORIDE: 99 mmol/L — AB (ref 101–111)
CO2: 27 mmol/L (ref 22–32)
CREATININE: 2.49 mg/dL — AB (ref 0.61–1.24)
Calcium: 8 mg/dL — ABNORMAL LOW (ref 8.9–10.3)
GFR calc Af Amer: 28 mL/min — ABNORMAL LOW (ref >60)
GFR calc non Af Amer: 24 mL/min — ABNORMAL LOW (ref >60)
Glucose, Bld: 37 mg/dL — CL (ref 65–99)
POTASSIUM: 3.9 mmol/L (ref 3.5–5.1)
Phosphorus: 2.4 mg/dL — ABNORMAL LOW (ref 2.5–4.6)
Sodium: 138 mmol/L (ref 135–145)

## 2016-03-04 LAB — GLUCOSE, CAPILLARY
GLUCOSE-CAPILLARY: 93 mg/dL (ref 65–99)
GLUCOSE-CAPILLARY: 83 mg/dL (ref 65–99)
Glucose-Capillary: 106 mg/dL — ABNORMAL HIGH (ref 65–99)
Glucose-Capillary: 33 mg/dL — CL (ref 65–99)
Glucose-Capillary: 79 mg/dL (ref 65–99)

## 2016-03-04 LAB — CBC
HCT: 33.1 % — ABNORMAL LOW (ref 39.0–52.0)
Hemoglobin: 10.1 g/dL — ABNORMAL LOW (ref 13.0–17.0)
MCH: 27 pg (ref 26.0–34.0)
MCHC: 30.5 g/dL (ref 30.0–36.0)
MCV: 88.5 fL (ref 78.0–100.0)
PLATELETS: 186 10*3/uL (ref 150–400)
RBC: 3.74 MIL/uL — ABNORMAL LOW (ref 4.22–5.81)
RDW: 17.7 % — AB (ref 11.5–15.5)
WBC: 8 10*3/uL (ref 4.0–10.5)

## 2016-03-04 LAB — HEPATITIS B SURFACE ANTIGEN: Hepatitis B Surface Ag: NEGATIVE

## 2016-03-04 SURGERY — ARTERIOVENOUS (AV) FISTULA CREATION
Anesthesia: Monitor Anesthesia Care | Site: Arm Lower | Laterality: Left

## 2016-03-04 MED ORDER — LIDOCAINE-EPINEPHRINE (PF) 1 %-1:200000 IJ SOLN
INTRAMUSCULAR | Status: AC
  Filled 2016-03-04: qty 30

## 2016-03-04 MED ORDER — FENTANYL CITRATE (PF) 250 MCG/5ML IJ SOLN
INTRAMUSCULAR | Status: AC
  Filled 2016-03-04: qty 5

## 2016-03-04 MED ORDER — ONDANSETRON HCL 4 MG/2ML IJ SOLN
INTRAMUSCULAR | Status: DC | PRN
  Administered 2016-03-04: 4 mg via INTRAVENOUS

## 2016-03-04 MED ORDER — OXYCODONE-ACETAMINOPHEN 5-325 MG PO TABS
1.0000 | ORAL_TABLET | ORAL | Status: DC | PRN
  Filled 2016-03-04: qty 1

## 2016-03-04 MED ORDER — LIDOCAINE HCL (PF) 1 % IJ SOLN
INTRAMUSCULAR | Status: DC | PRN
  Administered 2016-03-04: 30 mL

## 2016-03-04 MED ORDER — PROPOFOL 10 MG/ML IV BOLUS
INTRAVENOUS | Status: AC
  Filled 2016-03-04: qty 20

## 2016-03-04 MED ORDER — PROTAMINE SULFATE 10 MG/ML IV SOLN
INTRAVENOUS | Status: DC | PRN
  Administered 2016-03-04: 30 mg via INTRAVENOUS

## 2016-03-04 MED ORDER — 0.9 % SODIUM CHLORIDE (POUR BTL) OPTIME
TOPICAL | Status: DC | PRN
  Administered 2016-03-04: 1000 mL

## 2016-03-04 MED ORDER — VANCOMYCIN HCL 1000 MG IV SOLR
1000.0000 mg | INTRAVENOUS | Status: DC | PRN
  Administered 2016-03-04: 1000 mg via INTRAVENOUS

## 2016-03-04 MED ORDER — LIDOCAINE HCL (PF) 1 % IJ SOLN
INTRAMUSCULAR | Status: AC
  Filled 2016-03-04: qty 30

## 2016-03-04 MED ORDER — FENTANYL CITRATE (PF) 100 MCG/2ML IJ SOLN
INTRAMUSCULAR | Status: DC | PRN
  Administered 2016-03-04: 50 ug via INTRAVENOUS
  Administered 2016-03-04: 25 ug via INTRAVENOUS

## 2016-03-04 MED ORDER — DEXTROSE 50 % IV SOLN
INTRAVENOUS | Status: AC
  Administered 2016-03-04: 06:00:00
  Filled 2016-03-04: qty 50

## 2016-03-04 MED ORDER — HEPARIN SODIUM (PORCINE) 1000 UNIT/ML IJ SOLN
INTRAMUSCULAR | Status: DC | PRN
  Administered 2016-03-04: 5000 [IU] via INTRAVENOUS

## 2016-03-04 MED ORDER — ROCURONIUM BROMIDE 50 MG/5ML IV SOLN
INTRAVENOUS | Status: AC
  Filled 2016-03-04: qty 1

## 2016-03-04 MED ORDER — VANCOMYCIN HCL IN DEXTROSE 1-5 GM/200ML-% IV SOLN
INTRAVENOUS | Status: AC
  Filled 2016-03-04: qty 200

## 2016-03-04 MED ORDER — SODIUM CHLORIDE 0.9 % IV SOLN
INTRAVENOUS | Status: DC | PRN
  Administered 2016-03-04: 13:00:00

## 2016-03-04 MED ORDER — PROPOFOL 500 MG/50ML IV EMUL
INTRAVENOUS | Status: DC | PRN
  Administered 2016-03-04: 75 ug/kg/min via INTRAVENOUS

## 2016-03-04 MED ORDER — SODIUM CHLORIDE 0.9 % IV SOLN
INTRAVENOUS | Status: DC | PRN
  Administered 2016-03-04: 13:00:00 via INTRAVENOUS

## 2016-03-04 SURGICAL SUPPLY — 35 items
ARMBAND PINK RESTRICT EXTREMIT (MISCELLANEOUS) ×3 IMPLANT
CANISTER SUCTION 2500CC (MISCELLANEOUS) ×3 IMPLANT
CANNULA VESSEL 3MM 2 BLNT TIP (CANNULA) ×3 IMPLANT
CLIP TI MEDIUM 6 (CLIP) ×3 IMPLANT
CLIP TI WIDE RED SMALL 6 (CLIP) ×6 IMPLANT
DECANTER SPIKE VIAL GLASS SM (MISCELLANEOUS) ×3 IMPLANT
ELECT REM PT RETURN 9FT ADLT (ELECTROSURGICAL) ×3
ELECTRODE REM PT RTRN 9FT ADLT (ELECTROSURGICAL) ×1 IMPLANT
GLOVE BIO SURGEON STRL SZ 6.5 (GLOVE) ×2 IMPLANT
GLOVE BIO SURGEON STRL SZ7.5 (GLOVE) ×3 IMPLANT
GLOVE BIO SURGEONS STRL SZ 6.5 (GLOVE) ×1
GLOVE BIOGEL PI IND STRL 6.5 (GLOVE) ×3 IMPLANT
GLOVE BIOGEL PI IND STRL 7.0 (GLOVE) ×1 IMPLANT
GLOVE BIOGEL PI IND STRL 7.5 (GLOVE) ×1 IMPLANT
GLOVE BIOGEL PI IND STRL 8 (GLOVE) ×1 IMPLANT
GLOVE BIOGEL PI INDICATOR 6.5 (GLOVE) ×6
GLOVE BIOGEL PI INDICATOR 7.0 (GLOVE) ×2
GLOVE BIOGEL PI INDICATOR 7.5 (GLOVE) ×2
GLOVE BIOGEL PI INDICATOR 8 (GLOVE) ×2
GLOVE ECLIPSE 7.0 STRL STRAW (GLOVE) ×3 IMPLANT
GLOVE SURG SS PI 6.5 STRL IVOR (GLOVE) ×3 IMPLANT
GOWN STRL REUS W/ TWL LRG LVL3 (GOWN DISPOSABLE) ×4 IMPLANT
GOWN STRL REUS W/TWL LRG LVL3 (GOWN DISPOSABLE) ×8
KIT BASIN OR (CUSTOM PROCEDURE TRAY) ×3 IMPLANT
KIT ROOM TURNOVER OR (KITS) ×3 IMPLANT
LIQUID BAND (GAUZE/BANDAGES/DRESSINGS) ×3 IMPLANT
NS IRRIG 1000ML POUR BTL (IV SOLUTION) ×3 IMPLANT
PACK CV ACCESS (CUSTOM PROCEDURE TRAY) ×3 IMPLANT
PAD ARMBOARD 7.5X6 YLW CONV (MISCELLANEOUS) ×6 IMPLANT
SUT PROLENE 6 0 BV (SUTURE) ×6 IMPLANT
SUT VIC AB 3-0 SH 27 (SUTURE) ×2
SUT VIC AB 3-0 SH 27X BRD (SUTURE) ×1 IMPLANT
SUT VICRYL 4-0 PS2 18IN ABS (SUTURE) ×3 IMPLANT
UNDERPAD 30X30 INCONTINENT (UNDERPADS AND DIAPERS) ×3 IMPLANT
WATER STERILE IRR 1000ML POUR (IV SOLUTION) ×3 IMPLANT

## 2016-03-04 NOTE — Op Note (Signed)
    NAME: Jeffrey Walls   MRN: [id number] DOB: 1941-05-11    DATE OF OPERATION: 03/04/2016  PREOP DIAGNOSIS: Stage V chronic kidney disease  POSTOP DIAGNOSIS: Same  PROCEDURE: Left radial cephalic AV fistula  SURGEON: Di Kindlehristopher S. Edilia Boickson, MD, FACS  ASSIST: Lianne CureMaureen Collins PA  ANESTHESIA: local with sedation   EBL: minimal  INDICATIONS: Jeffrey Walls is a 75 y.o. male who is on dialysis via a tunneled dialysis catheter. He presents for first time access.  FINDINGS: 3 mm forearm cephalic vein. 2 mm radial artery.  TECHNIQUE: The patient was taken to the operating room and was sedated by anesthesia. The left upper extremity was prepped and draped in usual sterile fashion. After the skin was anesthetized with 1% lidocaine, an oblique incision was made at the wrist. Here the cephalic vein was dissected free and ligated distally. It irrigated up with heparinized saline. It was proximally 3 mm in diameter. The radial artery was dissected free beneath the fascia. The patient was heparinized. The radial artery was clamped proximally and distally and a longitudinal arteriotomy was made. The vein was spatulated and sewn end to side to the artery using continuous 6-0 Prolene suture. At the completion was a good thrill in the fistula. The heparin was partially reversed with protamine. The wound was closed at the peritoneal Vicryl and the skin closed with 4-0 Vicryl. Liquid Mahan was applied. The patient tolerated the procedure well and transferred to recovery in stable condition. All needle and sponge counts were correct.  Waverly Ferrarihristopher Valla Pacey, MD, FACS [facility]  DATE OF DICTATION:   03/04/2016

## 2016-03-04 NOTE — Interval H&P Note (Signed)
History and Physical Interval Note:  03/04/2016 12:42 PM  Christianne DolinEdgar Hoskin  has presented today for surgery, with the diagnosis of End Stage Renal Disease N18.6  The various methods of treatment have been discussed with the patient and family. After consideration of risks, benefits and other options for treatment, the patient has consented to  Procedure(s): ARTERIOVENOUS (AV) FISTULA CREATION VERSUS GRAFT INSERTION (Left) as a surgical intervention .  The patient's history has been reviewed, patient examined, no change in status, stable for surgery.  I have reviewed the patient's chart and labs.  Questions were answered to the patient's satisfaction.     Waverly Ferrariickson, Christopher

## 2016-03-04 NOTE — Progress Notes (Signed)
Patient ID: Jeffrey Walls, male   DOB: 10-05-1941, 75 y.o.   MRN: [id number]  [facility] Progress Note   Assessment/ Plan:   1. ESRD: Admitted with significant difficulty getting to and from [name]) for hemodialysis. He has been accepted to the dialysis unit in [place] on a Monday/Wednesday/Friday schedule. He will undergo hemodialysis tomorrow via his dialysis catheter and be discharged thereafter. Today, he underwent placement of left radiocephalic fistula with intraoperative findings noted in Dr. Adele Walls report (3 mm forearm cephalic vein, 2 mm radial artery. 2. Anemia: Hemoglobin level just >10-anticipate some losses with surgery today and will recheck again tomorrow, on subcutaneous Aranesp, continue to follow trend. 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 Nutrition: Appears chronically malnourished, continue renal vitamin and oral nutritional supplements. 5. Hypertension: Blood pressures acceptable limits, monitor with ultrafiltration/hemodialysis.  Subjective:   Seen postoperatively and states that he is feeling cold and having just returned from the PACU.    Objective:   BP 128/85 mmHg  Pulse 102  Temp(Src) 99.8 F (37.7 C) (Oral)  Resp 16  Wt 57.8 kg (127 lb 6.8 oz)  SpO2 91%  Physical Exam: Gen: Shivering while resting in bed CVS: Pulse regular in rate and rhythm, S1 and S2 normal Resp: Clear to auscultation, no rales Abd: Soft, flat, nontender Ext: 2-3+ lower extremity edema, left RCF with faint thrill, low pitch bruit  Labs: BMET  Recent Labs Lab 02/28/16 2200 02/29/16 0700 02/29/16 1340 03/01/16 0755 03/02/16 0600 03/03/16 0704 03/04/16 0539  NA 138  --  139 139 139 137 138  K 3.7 3.4* 3.6 3.4* 4.4 3.8 3.9  CL 102  --  104 104 100* 103 99*  CO2 25  --  GLUCOSE 208*  --  204* 146* 127* 65 37*  BUN 30*  --  32* 19 15 28* 18   CREATININE 4.10*  --  4.04* 2.71* 2.74* 3.65* 2.49*  CALCIUM 7.9*  --  7.6* 7.1* 7.7* 7.6* 8.0*  PHOS  --   --  3.9 2.8  --  2.9 2.4*   CBC  Recent Labs Lab 02/28/16 2200  03/01/16 0755 03/02/16 0600 03/03/16 0704 03/04/16 0539  WBC 6.6  < > 2.8* 5.5 6.6 8.0  NEUTROABS 4.6  --  1.6*  --   --   --   HGB 9.8*  < > 8.2* 8.5* 9.2* 10.1*  HCT 30.2*  < > 25.4* 27.1* 27.9* 33.1*  MCV 88.6  < > 88.8 89.4 86.9 88.5  PLT 131*  < > 108* 136* 146* 186  < > = values in this interval not displayed. Medications:    . aspirin EC  81 mg Oral Daily  . atorvastatin  10 mg Oral QPM  . citalopram  20 mg Oral Daily  . darbepoetin (ARANESP) injection - DIALYSIS  60 mcg Intravenous Q Fri-HD  . fludrocortisone  0.1 mg Oral Daily  . folic acid  1 mg Oral Daily  . heparin  5,000 Units Subcutaneous 3 times per day  . insulin aspart  0-5 Units Subcutaneous QHS  . insulin aspart  0-9 Units Subcutaneous TID WC  . lisinopril  20 mg Oral Daily  . metoprolol succinate  50 mg Oral Daily  . multivitamin  1 tablet Oral QHS  . pantoprazole  20 mg Oral Daily  . sodium chloride flush  3 mL Intravenous Q12H  . vancomycin  1,000 mg Intravenous To SS-Surg  . vitamin B-12  1,000 mcg Oral Daily   Jeffrey BillsJay Anadalay Macdonell, MD 03/04/2016, 4:40 PM

## 2016-03-04 NOTE — Progress Notes (Signed)
   03/04/16 1300  PT Visit Information  Last PT Received On 03/04/16  Reason Eval/Treat Not Completed Patient at procedure or test/unavailable  Will try later as time allows.  Samul Dadauth Dimples Probus, PT MS Acute Rehab Dept. Number: [facility] [id number] and [facility] [phone]

## 2016-03-04 NOTE — Anesthesia Procedure Notes (Signed)
Procedure Name: MAC Date/Time: 03/04/2016 1:10 PM Performed by: Dairl PonderJIANG, Mashal Slavick Pre-anesthesia Checklist: Patient identified, Emergency Drugs available, Suction available, Patient being monitored and Timeout performed Patient Re-evaluated:Patient Re-evaluated prior to inductionOxygen Delivery Method: Nasal cannula

## 2016-03-04 NOTE — Care Management Note (Signed)
Case Management Note  Patient Details  Name: Jeffrey Walls MRN: [id number] Date of Birth: [birth year]  Subjective/Objective:      CM following for progression and d/c planning.              Action/Plan: 03/04/2016 received call from [facility], social worker Onalee HuaDavid, with concerns re pt transportation issurs. Per Onalee Huaavid this pt is currently being eval for adm to HD in [place]. Will need transportation to HD center, however upon discussion with CSW intern this pt [place] therefore the SNF will have time to assist this pt with Transportation needs.   Expected Discharge Date:                  Expected Discharge Plan:  Skilled Nursing Facility  In-House Referral:  Clinical Social Work  Discharge planning Services  CM Consult  Post Acute Care Choice:    Choice offered to:  Patient  DME Arranged:    DME Agency:     HH Arranged:    HH Agency:     Status of Service:  In process, will continue to follow  Medicare Important Message Given:  Yes Date Medicare IM Given:    Medicare IM give by:    Date Additional Medicare IM Given:    Additional Medicare Important Message give by:     If discussed at Long Length of Stay Meetings, dates discussed:    Additional Comments:  Starlyn SkeansRoyal, Osa Fogarty U, RN 03/04/2016, 11:00 AM

## 2016-03-04 NOTE — H&P (View-Only) (Signed)
   VASCULAR SURGERY ASSESSMENT & PLAN:  * Plan for left AV fistula or AV graft tomorrow. Looking at his vein map it appears that he will be a candidate for a fistula.    SUBJECTIVE: Resting comfortably.  PHYSICAL EXAM: Filed Vitals:   03/02/16 0810 03/02/16 1637 03/02/16 1959 03/03/16 0633  BP: 137/80 125/65 121/53 160/73  Pulse: 77 62 72 63  Temp: 99 F (37.2 C) 98.5 F (36.9 C) 98.4 F (36.9 C)   TempSrc: Oral Oral Oral   Resp: 18 19 20 19   Weight:   126 lb 15.8 oz (57.6 kg)   SpO2: 92% 96% 97% 98%   No change in exam.  LABS: Lab Results  Component Value Date   WBC 5.5 03/02/2016   HGB 8.5* 03/02/2016   HCT 27.1* 03/02/2016   MCV 89.4 03/02/2016   PLT 136* 03/02/2016   Lab Results  Component Value Date   CREATININE 2.74* 03/02/2016   Lab Results  Component Value Date   INR 1.29 12/25/2015   CBG (last 3)   Recent Labs  03/02/16 1137 03/02/16 1636 03/02/16 1959  GLUCAP 146* 107* 117*   VEIN MAP: His upper arm cephalic vein appears to be reasonable in size for potentially a left brachiocephalic AV fistula.   Principal Problem:   Volume overload Active Problems:   Hypertension   ESRD on hemodialysis (HCC)   Fluid overload   Frequent falls   Cari CarawayChris Dickson Beeper: [phone] 03/03/2016

## 2016-03-04 NOTE — Clinical Social Work Note (Signed)
CSW intern was unable to speak with patient to discuss his discharge plans. Patient was having a procedure done on another unit during this attempt. CSW will follow up.

## 2016-03-04 NOTE — Progress Notes (Signed)
03/04/2016 8:51 AM  Hypoglycemic Event  CBG: 33 at 0546  Treatment: D50 IV 25 mL  Symptoms: Shaky and Nervous/irritable  Follow-up CBG: UJWJ:1914Time:0627 CBG Result:106  Possible Reasons for Event: Inadequate meal intake  Comments/MD notified: Dr. Izola PriceMyers notified.    Theadora RamaKIRKMAN, Lucas Winograd Brooke
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
## 2016-03-04 NOTE — Transfer of Care (Signed)
Immediate Anesthesia Transfer of Care Note  Patient: Jeffrey Walls  Procedure(s) Performed: Procedure(s): ARTERIOVENOUS (AV) FISTULA CREATION LEFT RADIOCEPHALIC (Left)  Patient Location: PACU  Anesthesia Type:MAC  Level of Consciousness: awake, alert  and oriented  Airway & Oxygen Therapy: Patient Spontanous Breathing and Patient connected to nasal cannula oxygen  Post-op Assessment: Report given to RN and Post -op Vital signs reviewed and stable  Post vital signs: Reviewed and stable  Last Vitals:  Filed Vitals:   03/04/16 0802 03/04/16 1415  BP: 139/60   Pulse: 70 67  Temp: 36.9 C   Resp: 18     Complications: No apparent anesthesia complications

## 2016-03-04 NOTE — Anesthesia Postprocedure Evaluation (Signed)
Anesthesia Post Note  Patient: Jeffrey Walls  Procedure(s) Performed: Procedure(s) (LRB): ARTERIOVENOUS (AV) FISTULA CREATION LEFT RADIOCEPHALIC (Left)  Patient location during evaluation: PACU Anesthesia Type: MAC Level of consciousness: awake and alert Pain management: pain level controlled Vital Signs Assessment: post-procedure vital signs reviewed and stable Respiratory status: spontaneous breathing, nonlabored ventilation, respiratory function stable and patient connected to nasal cannula oxygen Cardiovascular status: stable and blood pressure returned to baseline Anesthetic complications: no    Last Vitals:  Filed Vitals:   03/04/16 1415 03/04/16 1430  BP: 107/59 111/59  Pulse: 67 66  Temp: 36.2 C   Resp: 10 12    Last Pain:  Filed Vitals:   03/04/16 1434  PainSc: Asleep                 Marleen Moret,JAMES TERRILL

## 2016-03-04 NOTE — Anesthesia Preprocedure Evaluation (Addendum)
Anesthesia Evaluation  Patient identified by MRN, date of birth, ID band Patient awake    Reviewed: Allergy & Precautions, NPO status , Patient's Chart, lab work & pertinent test results  History of Anesthesia Complications Negative for: history of anesthetic complications  Airway Mallampati: II  TM Distance: >3 FB Neck ROM: Full  Mouth opening: Limited Mouth Opening  Dental  (+) Poor Dentition, Missing, Dental Advisory Given   Pulmonary shortness of breath,    breath sounds clear to auscultation       Cardiovascular hypertension, + CAD   Rhythm:Regular Rate:Normal     Neuro/Psych    GI/Hepatic   Endo/Other  diabetes  Renal/GU ESRFRenal disease     Musculoskeletal   Abdominal   Peds  Hematology   Anesthesia Other Findings   Reproductive/Obstetrics                           Anesthesia Physical Anesthesia Plan  ASA: III  Anesthesia Plan: MAC   Post-op Pain Management:    Induction: Intravenous  Airway Management Planned: Simple Face Mask  Additional Equipment:   Intra-op Plan:   Post-operative Plan: Extubation in OR  Informed Consent: I have reviewed the patients History and Physical, chart, labs and discussed the procedure including the risks, benefits and alternatives for the proposed anesthesia with the patient or authorized representative who has indicated his/her understanding and acceptance.   Dental advisory given  Plan Discussed with: CRNA and Surgeon  Anesthesia Plan Comments:         Anesthesia Quick Evaluation

## 2016-03-04 NOTE — Progress Notes (Signed)
03/04/2016 1:55 PM Hemodialysis Outpatient Note; this patient has been accepted at the [facility] on a Monday, Wednesday and Friday 2nd shift schedule. The center will need the patient to arrive at 11:00 AM his first day of dialysis. Thank you. Tilman NeatKrzywonos, Seleni Meller H

## 2016-03-05 ENCOUNTER — Other Ambulatory Visit: Payer: Self-pay | Admitting: *Deleted

## 2016-03-05 ENCOUNTER — Encounter (HOSPITAL_COMMUNITY): Payer: Self-pay | Admitting: Vascular Surgery

## 2016-03-05 DIAGNOSIS — E0821 Diabetes mellitus due to underlying condition with diabetic nephropathy: Secondary | ICD-10-CM

## 2016-03-05 DIAGNOSIS — N186 End stage renal disease: Secondary | ICD-10-CM

## 2016-03-05 DIAGNOSIS — D631 Anemia in chronic kidney disease: Secondary | ICD-10-CM

## 2016-03-05 DIAGNOSIS — Z794 Long term (current) use of insulin: Secondary | ICD-10-CM

## 2016-03-05 DIAGNOSIS — E43 Unspecified severe protein-calorie malnutrition: Secondary | ICD-10-CM | POA: Insufficient documentation

## 2016-03-05 DIAGNOSIS — Z4931 Encounter for adequacy testing for hemodialysis: Secondary | ICD-10-CM

## 2016-03-05 LAB — CBC
HEMATOCRIT: 27.8 % — AB (ref 39.0–52.0)
Hemoglobin: 9 g/dL — ABNORMAL LOW (ref 13.0–17.0)
MCH: 28.8 pg (ref 26.0–34.0)
MCHC: 32.4 g/dL (ref 30.0–36.0)
MCV: 88.8 fL (ref 78.0–100.0)
PLATELETS: 152 10*3/uL (ref 150–400)
RBC: 3.13 MIL/uL — ABNORMAL LOW (ref 4.22–5.81)
RDW: 18.2 % — AB (ref 11.5–15.5)
WBC: 7.7 10*3/uL (ref 4.0–10.5)

## 2016-03-05 LAB — GLUCOSE, CAPILLARY
GLUCOSE-CAPILLARY: 94 mg/dL (ref 65–99)
GLUCOSE-CAPILLARY: 238 mg/dL — AB (ref 65–99)
Glucose-Capillary: 100 mg/dL — ABNORMAL HIGH (ref 65–99)
Glucose-Capillary: 93 mg/dL (ref 65–99)
Glucose-Capillary: 186 mg/dL — ABNORMAL HIGH (ref 65–99)

## 2016-03-05 LAB — RENAL FUNCTION PANEL
ALBUMIN: 1.9 g/dL — AB (ref 3.5–5.0)
Anion gap: 9 (ref 5–15)
BUN: 27 mg/dL — AB (ref 6–20)
CHLORIDE: 100 mmol/L — AB (ref 101–111)
CO2: 27 mmol/L (ref 22–32)
Calcium: 7.8 mg/dL — ABNORMAL LOW (ref 8.9–10.3)
Creatinine, Ser: 3.48 mg/dL — ABNORMAL HIGH (ref 0.61–1.24)
GFR calc Af Amer: 19 mL/min — ABNORMAL LOW (ref >60)
GFR calc non Af Amer: 16 mL/min — ABNORMAL LOW (ref >60)
GLUCOSE: 100 mg/dL — AB (ref 65–99)
POTASSIUM: 4.8 mmol/L (ref 3.5–5.1)
Phosphorus: 4.2 mg/dL (ref 2.5–4.6)
Sodium: 136 mmol/L (ref 135–145)

## 2016-03-05 MED ORDER — DOXYCYCLINE HYCLATE 100 MG PO TABS
100.0000 mg | ORAL_TABLET | Freq: Two times a day (BID) | ORAL | Status: DC
  Administered 2016-03-05 – 2016-03-06 (×2): 100 mg via ORAL
  Filled 2016-03-05 (×2): qty 1

## 2016-03-05 NOTE — NC FL2 (Signed)
[name] Birthdate: 11/24/1941 Sex: male Admission Date (Current Location): 02/28/2016  [facility] and [place] Number:  [employer] and Address:  [facility] [place], [address], [place], [place] [address]      Provider Number: [id number]  Attending Physician Name and Address:  Jeffrey Ogle, MD  Relative Name and Phone Number:  Jeffrey Walls, daughter. Phone #[phone]    Current Level of Care: Hospital Recommended Level of Care: Skilled Nursing Facility Prior Approval Number:    Date Approved/Denied:   PASRR Number: [id number] (Eff. 03/05/16)  Discharge Plan: SNF    Current Diagnoses: Patient Active Problem List   Diagnosis Date Noted  . Essential hypertension 03/04/2016  . Pancytopenia (HCC) 03/04/2016  . Diabetes mellitus with diabetic nephropathy, with long-term current use of insulin (HCC) 03/04/2016  . Frequent falls 02/29/2016  . Volume overload 02/28/2016  . ESRD on hemodialysis (HCC) 12/25/2015  . Anemia of renal disease 12/25/2015    Orientation RESPIRATION BLADDER Height & Weight     Self, Time, Situation, Place  Normal Continent Weight: 125 lb 14.1 oz (57.1 kg) Height:   (175.3 cm)  BEHAVIORAL SYMPTOMS/MOOD NEUROLOGICAL BOWEL NUTRITION STATUS      Continent Diet (Renal diet with 1200 mL fluid restriction)  AMBULATORY STATUS COMMUNICATION OF NEEDS Skin   Extensive Assist (Patient unable to ambulate during evaluation due to fatigue) Verbally Ecchymosis bilateral arms. Incision left arm                       Personal Care Assistance Level of Assistance  Bathing, Feeding, Dressing Bathing Assistance: Maximum assistance Feeding assistance: Independent Dressing Assistance: Maximum assistance     Functional Limitations Info  Sight, Hearing, Speech Sight Info: Adequate Hearing Info: Adequate Speech Info: Adequate     SPECIAL CARE FACTORS FREQUENCY  PT (By licensed PT)     PT Frequency: Evaluation 3/18 - a minimum of 3X per week recommended              Contractures Contractures Info: Not present    Additional Factors Info  Code Status, Allergies Code Status Info: Full Code Allergies Info: Ampicillin, Benadryl  Insulin 3X per day with meals - sliding scale Insulin daily at bedtime - sliding scale         Current Medications (03/05/2016):  This is the current hospital active medication list Current Facility-Administered Medications  Medication Dose Route Frequency Provider Last Rate Last Dose  . acetaminophen (TYLENOL) tablet 650 mg  650 mg Oral Q6H PRN Jeffrey Braun, MD       Or  . acetaminophen (TYLENOL) suppository 650 mg  650 mg Rectal Q6H PRN Jeffrey Braun, MD      . aspirin EC tablet 81 mg  81 mg Oral Daily Jeffrey Braun, MD   81 mg at 03/05/16 1206  . atorvastatin (LIPITOR) tablet 10 mg  10 mg Oral QPM Jeffrey Braun, MD   10 mg at 03/04/16 1701  . citalopram (CELEXA) tablet 20 mg  20 mg Oral Daily Jeffrey Braun, MD   20 mg at 03/05/16 1206  . Darbepoetin Alfa (ARANESP) injection 60 mcg  60 mcg Intravenous Q Fri-HD Jeffrey Mody, MD   60 mcg at 02/29/16 1840  . fludrocortisone (FLORINEF) tablet 0.1 mg  0.1 mg Oral Daily Jeffrey Braun, MD   0.1 mg  at 03/05/16 1206  . folic acid (FOLVITE) tablet 1 mg  1 mg Oral Daily Jeffrey A Katrinka BlazingSmith, MD   1 mg at 03/05/16 1206  . heparin injection 5,000 Units  5,000 Units Subcutaneous 3 times per day Jeffrey Braunondell A Smith, MD   5,000 Units at 02/29/16 1400  . insulin aspart (novoLOG) injection 0-5 Units  0-5 Units Subcutaneous QHS Jeffrey Braunondell A Smith, MD   2 Units at 02/29/16 2128  . insulin aspart (novoLOG) injection 0-9 Units  0-9 Units Subcutaneous TID WC Jeffrey Braunondell A Smith, MD   1 Units at 03/02/16 1236  . lisinopril (PRINIVIL,ZESTRIL) tablet 20 mg  20 mg Oral Daily Jeffrey Braunondell A Smith, MD   20 mg at 03/05/16 1206  . metoprolol succinate (TOPROL-XL) 24  hr tablet 50 mg  50 mg Oral Daily Jeffrey Braunondell A Smith, MD   50 mg at 03/05/16 1206  . multivitamin (RENA-VIT) tablet 1 tablet  1 tablet Oral QHS Jeffrey BillsJay Patel, MD   1 tablet at 03/04/16 2210  . ondansetron (ZOFRAN) tablet 4 mg  4 mg Oral Q6H PRN Jeffrey Braunondell A Smith, MD       Or  . ondansetron (ZOFRAN) injection 4 mg  4 mg Intravenous Q6H PRN Jeffrey Braunondell A Smith, MD      . oxyCODONE-acetaminophen (PERCOCET/ROXICET) 5-325 MG per tablet 1-2 tablet  1-2 tablet Oral Q4H PRN Jeffrey Hinthristopher S Dickson, MD      . pantoprazole (PROTONIX) EC tablet 20 mg  20 mg Oral Daily Jeffrey Braunondell A Smith, MD   20 mg at 03/05/16 1206  . sodium chloride flush (NS) 0.9 % injection 3 mL  3 mL Intravenous Q12H Jeffrey Braunondell A Smith, MD   3 mL at 03/05/16 1208  . vitamin B-12 (CYANOCOBALAMIN) tablet 1,000 mcg  1,000 mcg Oral Daily Jeffrey Braunondell A Smith, MD   1,000 mcg at 03/05/16 1206  . zolpidem (AMBIEN) tablet 5 mg  5 mg Oral QHS PRN Jeffrey Braunondell A Smith, MD   5 mg at 03/04/16 2210     Discharge Medications: Please see discharge summary for a list of discharge medications.  Relevant Imaging Results:  Relevant Lab Results:   Additional Information SS#[id number].  Dialysis MWF, 2nd shift [facility].   Jeffrey Walls, Jeffrey Sexson Bradley, LCSW

## 2016-03-05 NOTE — Procedures (Signed)
Patient seen on Hemodialysis. QB 350, UF goal 1.5L Treatment adjusted as needed.  Zetta BillsJay Dandrea Medders MD [facility]. Office # [phone] Pager # [phone] 9:55 AM

## 2016-03-05 NOTE — Discharge Instructions (Signed)
° ° °  03/05/2016 Jeffrey Walls [id number] 1941/03/08  Surgeon(s): [name], MD  Procedure(s): CREATION LEFT RADIAL CEPHALIC ARTERIOVENOUS (AV) FISTULA   Do not stick fistula for 12 weeks
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
## 2016-03-05 NOTE — Progress Notes (Signed)
Physical Therapy Treatment Patient Details Name: Jeffrey Walls MRN: [id number] DOB: 1941/01/14 Today's Date: 03/05/2016    History of Present Illness Patient is a 75 yo male admitted 02/28/16 with weakness, SOB.  Patient with volume overload - missed HD appointments.   PMH:  ESRD on HD, DM, HTN, CAD, frequent falls    PT Comments    Progressing towards functional goals. Still with instability during ambulation, improves moderately with introduction of a rolling walker for support. Educated on therapeutic exercises to be performed several times per day. Very motivated to improve his strength and functional independence. Requires physical assist for gait and is at risk for falls. Patient will continue to benefit from skilled physical therapy services to further improve independence with functional mobility.   Follow Up Recommendations  SNF;Supervision for mobility/OOB     Equipment Recommendations  None recommended by PT    Recommendations for Other Services       Precautions / Restrictions Precautions Precautions: Fall Precaution Comments: Per chart, frequent falls at home. Restrictions Weight Bearing Restrictions: No    Mobility  Bed Mobility Overal bed mobility: Needs Assistance Bed Mobility: Sit to Supine;Supine to Sit     Supine to sit: Supervision Sit to supine: Supervision   General bed mobility comments: Requires extra time. Supervision for safety. Did not require assist but used rail  Transfers Overall transfer level: Needs assistance Equipment used: Rolling walker (2 wheeled) Transfers: Sit to/from Stand Sit to Stand: Min guard         General transfer comment: Close guard for safety. Slow to rise, VC for hand placement. Practiced x4 from lowest bed setting.  Ambulation/Gait Ambulation/Gait assistance: Min assist Ambulation Distance (Feet): 50 Feet (+40) Assistive device: Rolling walker (2 wheeled);None Gait Pattern/deviations: Step-through  pattern;Decreased stance time - left;Scissoring;Drifts right/left;Staggering left;Wide base of support;Trunk flexed Gait velocity: decreased Gait velocity interpretation: Below normal speed for age/gender General Gait Details: Required seated rest break after 40 feet without use of RW, intermittent min assist for balance, VC for awareness, reaching for rail in hallway demonstrating instability. With RW introduced and educated on use, pt able to ambulate 50 feet, and improved balance but still with intermittent instability and scissoring of gait.    Stairs            Wheelchair Mobility    Modified Rankin (Stroke Patients Only)       Balance                                    Cognition Arousal/Alertness: Awake/alert Behavior During Therapy: WFL for tasks assessed/performed Overall Cognitive Status: Within Functional Limits for tasks assessed                      Exercises General Exercises - Lower Extremity Ankle Circles/Pumps: AROM;Both;10 reps;Seated Quad Sets: Strengthening;Both;10 reps;Supine Long Arc Quad: Strengthening;Both;10 reps;Seated Hip ABduction/ADduction: Strengthening;Both;10 reps;Seated (adduction with pillow) Hip Flexion/Marching: Strengthening;Both;10 reps;Seated Mini-Sqauts: Strengthening;Both;10 reps;Standing (Moderate use of UEs on RW)    General Comments General comments (skin integrity, edema, etc.): Eager to work with therapy today      Pertinent Vitals/Pain Pain Assessment: No/denies pain Pain Intervention(s): Monitored during session    Home Living                      Prior Function            PT Goals (current  goals can now be found in the care plan section) Acute Rehab PT Goals Patient Stated Goal: None stated PT Goal Formulation: With patient Time For Goal Achievement: 03/08/16 Potential to Achieve Goals: Good Progress towards PT goals: Progressing toward goals    Frequency  Min 3X/week    PT  Plan Current plan remains appropriate    Co-evaluation             End of Session Equipment Utilized During Treatment: Gait belt Activity Tolerance: Patient limited by fatigue Patient left: with call bell/phone within reach;in bed;with bed alarm set     Time: 1551-1611 PT Time Calculation (min) (ACUTE ONLY): 20 min  Charges:  $Therapeutic Exercise: 8-22 mins                    G Codes:      Jeffrey Walls, Jeffrey Walls S 03/05/2016, 4:23 PM Jeffrey Walls, Jeffrey Walls

## 2016-03-05 NOTE — Progress Notes (Addendum)
  Postoperative hemodialysis access  Date of Surgery:  03/04/16 Surgeon: Edilia Boickson  Subjective:  No complaints; denies any pain in his left hand  PHYSICAL EXAMINATION:  Filed Vitals:   03/05/16 0705 03/05/16 0730  BP: 124/60 118/62  Pulse: 68 67  Temp:    Resp: 18 18   Incision is clean & dry Sensation in digits is intact;  There is  Thrill  There is bruit. The fistula is palpable   ASSESSMENT/PLAN:  Jeffrey Walls is a 75 y.o. year old male who is s/p left radial cephalic AVF.  -fistula is patent -pt does not have evidence of steal sx -f/u with Dr. Edilia Boickson in 4-6 weeks to check maturation of AVF -will sign off-call as needed.  Jeffrey MassedSamantha Rhyne, PA-C [facility] [phone]  Agree with above.  Jeffrey Ferrarihristopher Mairyn Lenahan, MD, FACS Bee[facility] [phone] Office: [phone]
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
## 2016-03-05 NOTE — Progress Notes (Signed)
Patient ID: Jeffrey Walls, male   DOB: August 17, 1941, 75 y.o.   MRN: [id number]  [facility] Progress Note   Assessment/ Plan:   1. ESRD: Admitted with significant difficulty getting to and from [facility]) for hemodialysis. Yesterday underwent placement of left radiocephalic fistula with intraoperative findings noted in Dr. Adele Dan report (3 mm forearm cephalic vein, 2 mm radial artery. Plans for DC later today to start HD in [place] on MWF schedule 2nd shift.  2. Anemia: Hemoglobin level 9.0 post-op, on subcutaneous Aranesp, continue to follow trend and adjust ESA/IV Fe. 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 Nutrition: Appears chronically malnourished, continue renal vitamin and oral nutritional supplements. 5. Hypertension: Blood pressures acceptable limits, monitor with ultrafiltration/hemodialysis.  Subjective:   Reports to be feeling better and denies any chest pain/shortness of breath---denies left hand pain.    Objective:   BP 92/45 mmHg  Pulse 68  Temp(Src) 97.8 F (36.6 C) (Oral)  Resp 22  Wt 58.3 kg (128 lb 8.5 oz)  SpO2 98%  Physical Exam: Gen: Comfortably resting on dialysis CVS: Pulse regular in rate and rhythm, S1 and S2 normal Resp: Clear to auscultation, no rales Abd: Soft, flat, nontender Ext: 2+ lower extremity edema, left RCF with good thrill, low pitch bruit  Labs: BMET  Recent Labs Lab 02/28/16 2200 02/29/16 0700 02/29/16 1340 03/01/16 0755 03/02/16 0600 03/03/16 0704 03/04/16 0539 03/05/16 0610  NA 138  --  139 139 139 137 138 136  K 3.7 3.4* 3.6 3.4* 4.4 3.8 3.9 4.8  CL 102  --  104 104 100* 103 99* 100*  CO2 25  --  GLUCOSE 208*  --  204* 146* 127* 65 37* 100*  BUN 30*  --  32* 19 15 28* 18 27*  CREATININE 4.10*  --  4.04* 2.71* 2.74* 3.65* 2.49* 3.48*  CALCIUM 7.9*  --  7.6* 7.1* 7.7* 7.6* 8.0* 7.8*  PHOS  --   --  3.9  2.8  --  2.9 2.4* 4.2   CBC  Recent Labs Lab 02/28/16 2200  03/01/16 0755 03/02/16 0600 03/03/16 0704 03/04/16 0539 03/05/16 0610  WBC 6.6  < > 2.8* 5.5 6.6 8.0 7.7  NEUTROABS 4.6  --  1.6*  --   --   --   --   HGB 9.8*  < > 8.2* 8.5* 9.2* 10.1* 9.0*  HCT 30.2*  < > 25.4* 27.1* 27.9* 33.1* 27.8*  MCV 88.6  < > 88.8 89.4 86.9 88.5 88.8  PLT 131*  < > 108* 136* 146* 186 152  < > = values in this interval not displayed. Medications:    . aspirin EC  81 mg Oral Daily  . atorvastatin  10 mg Oral QPM  . citalopram  20 mg Oral Daily  . darbepoetin (ARANESP) injection - DIALYSIS  60 mcg Intravenous Q Fri-HD  . fludrocortisone  0.1 mg Oral Daily  . folic acid  1 mg Oral Daily  . heparin  5,000 Units Subcutaneous 3 times per day  . insulin aspart  0-5 Units Subcutaneous QHS  . insulin aspart  0-9 Units Subcutaneous TID WC  . lisinopril  20 mg Oral Daily  . metoprolol succinate  50 mg Oral Daily  . multivitamin  1 tablet Oral QHS  . pantoprazole  20 mg Oral Daily  . sodium chloride flush  3 mL Intravenous Q12H  .  vitamin B-12  1,000 mcg Oral Daily   Zetta BillsJay Pranika Finks, MD 03/05/2016, 9:56 AM

## 2016-03-06 ENCOUNTER — Telehealth: Payer: Self-pay | Admitting: Vascular Surgery

## 2016-03-06 DIAGNOSIS — N186 End stage renal disease: Secondary | ICD-10-CM | POA: Insufficient documentation

## 2016-03-06 LAB — CBC
HCT: 26.3 % — ABNORMAL LOW (ref 39.0–52.0)
HEMOGLOBIN: 8.3 g/dL — AB (ref 13.0–17.0)
MCH: 28.2 pg (ref 26.0–34.0)
MCHC: 31.6 g/dL (ref 30.0–36.0)
MCV: 89.5 fL (ref 78.0–100.0)
Platelets: 149 10*3/uL — ABNORMAL LOW (ref 150–400)
RBC: 2.94 MIL/uL — ABNORMAL LOW (ref 4.22–5.81)
RDW: 18 % — AB (ref 11.5–15.5)
WBC: 6.4 10*3/uL (ref 4.0–10.5)

## 2016-03-06 LAB — RENAL FUNCTION PANEL
ALBUMIN: 2 g/dL — AB (ref 3.5–5.0)
ANION GAP: 8 (ref 5–15)
BUN: 17 mg/dL (ref 6–20)
CALCIUM: 7.6 mg/dL — AB (ref 8.9–10.3)
CO2: 28 mmol/L (ref 22–32)
Chloride: 101 mmol/L (ref 101–111)
Creatinine, Ser: 2.65 mg/dL — ABNORMAL HIGH (ref 0.61–1.24)
GFR calc Af Amer: 26 mL/min — ABNORMAL LOW (ref >60)
GFR calc non Af Amer: 22 mL/min — ABNORMAL LOW (ref >60)
GLUCOSE: 130 mg/dL — AB (ref 65–99)
PHOSPHORUS: 2.8 mg/dL (ref 2.5–4.6)
Potassium: 3.8 mmol/L (ref 3.5–5.1)
SODIUM: 137 mmol/L (ref 135–145)

## 2016-03-06 LAB — GLUCOSE, CAPILLARY
GLUCOSE-CAPILLARY: 121 mg/dL — AB (ref 65–99)
GLUCOSE-CAPILLARY: 161 mg/dL — AB (ref 65–99)
Glucose-Capillary: 139 mg/dL — ABNORMAL HIGH (ref 65–99)

## 2016-03-06 MED ORDER — OXYCODONE-ACETAMINOPHEN 5-325 MG PO TABS: 1.0000 | ORAL_TABLET | ORAL | Status: DC | PRN

## 2016-03-06 MED ORDER — INSULIN DETEMIR 100 UNIT/ML ~~LOC~~ SOLN: 5.0000 [IU] | Freq: Every day | SUBCUTANEOUS | Status: DC

## 2016-03-06 MED ORDER — LISINOPRIL 20 MG PO TABS: 20.0000 mg | ORAL_TABLET | Freq: Every day | ORAL | Status: DC

## 2016-03-06 MED ORDER — ZOLPIDEM TARTRATE 5 MG PO TABS: 5.0000 mg | ORAL_TABLET | Freq: Every evening | ORAL | Status: DC | PRN

## 2016-03-06 MED ORDER — DOXYCYCLINE HYCLATE 100 MG PO TABS: 100.0000 mg | ORAL_TABLET | Freq: Two times a day (BID) | ORAL | Status: DC

## 2016-03-06 NOTE — Progress Notes (Signed)
[facility], [facility]. D/c information faxed to facility . Patient has no c/o pain or shortness of breath at d/c. [facility].  Aron Needles, Kae HellerMiranda Lynn, RN
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
## 2016-03-06 NOTE — Clinical Social Work Placement (Addendum)
   CLINICAL SOCIAL WORK PLACEMENT  NOTE 03/06/16 - DISCHAR[facility]  Date:  03/06/2016  Patient Details  Name: Jeffrey Walls MRN: [id number] Date of Birth: 07-Nov-1941  Clinical Social Work is seeking post-discharge placement for this patient at the Skilled  Nursing Facility level of care (*CSW will initial, date and re-position this form in  chart as items are completed):  Yes   Patient/family provided with [place] Clinical Social Work Department's list of facilities offering this level of care within the geographic area requested by the patient (or if unable, by the patient's family).  Yes   Patient/family informed of their freedom to choose among providers that offer the needed level of care, that participate in Medicare, Medicaid or managed care program needed by the patient, have an available bed and are willing to accept the patient.  Yes   Patient/family informed of 's ownership interest in [facility] and [facility], as well as of the fact that they are under no obligation to receive care at these facilities.  PASRR submitted to EDS on 03/05/16     PASRR number received on 03/05/16     Existing PASRR number confirmed on       FL2 transmitted to all facilities in geographic area requested by pt/family on 03/05/16     FL2 transmitted to all facilities within larger geographic area on       Patient informed that his/her managed care company has contracts with or will negotiate with certain facilities, including the following:        Yes   Patient/family informed of bed offers received.  Patient [facility]     Physician recommends and patient chooses bed at      Patient to [facility] on 03/06/16.  Patient to be transferred to facility by  ambulance     Patient family notified on  03/06/16 of transfer.  Name of family member notified:   [name] [phone]).     PHYSICIAN       Additional Comment:  03/06/16 - Facility received authorization from [facility] [employer].   _______________________________________________ Cristobal Goldmannrawford, Niamya Vittitow Bradley, LCSW 03/06/2016, 12:17 PM

## 2016-03-06 NOTE — Care Management Important Message (Signed)
Important Message  Patient Details  Name: Jeffrey Walls MRN: [id number] Date of Birth: 1941/08/22   Medicare Important Message Given:  Yes    Talissa Apple P Dori Devino 03/06/2016, 1:38 PM

## 2016-03-06 NOTE — Telephone Encounter (Signed)
-----   Message from Sharee PimpleMarilyn K McChesney, RN sent at 03/04/2016  2:39 PM EDT ----- Regarding: schedule   ----- Message -----    From: [name], MD    Sent: 03/04/2016   2:08 PM      To: [facility] Charge Pool Subject: charge                                         PROCEDURE: Left radial cephalic AV fistula  SURGEON: Di Kindlehristopher S. Edilia Boickson, MD, FACS  ASSIST: Lianne CureMaureen Collins PA  He will need a follow visit in 6 weeks with a duplex at that time to check on the maturation of his fistula. Thank you CD

## 2016-03-06 NOTE — Progress Notes (Signed)
Patient ID: Jeffrey Walls, male   DOB: 10-Mar-1941, 75 y.o.   MRN: [id number]  [facility] Progress Note   Assessment/ Plan:   1. ESRD: Admitted with significant difficulty getting to and from [facility] for hemodialysis. He underwent placement of left radiocephalic fistula on 03/04/16 with intraoperative findings noted in Dr. Adele Danickson's report (3 mm forearm cephalic vein, 2 mm radial artery. Plans for DC later today to a skilled nursing facility in [place] and then start HD in [place] on MWF schedule 2nd shift beginning this Friday 3/24. Will need sequential lowering of dry weight 2. Anemia: Hemoglobin level 8.3 post-op, on subcutaneous Aranesp, continue to follow trend and adjust ESA/IV Fe at dialysis as an outpatient. 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 Nutrition: Appears chronically malnourished, continue renal vitamin and oral nutritional supplements. 5. Hypertension: Blood pressures acceptable limits, monitor with ultrafiltration/hemodialysis.  Subjective:   Reports to be doing fair-denies any chest pain or shortness of breath. Awaiting discharge to skilled nursing facility in [place] today.    Objective:   BP 153/67 mmHg  Pulse 64  Temp(Src) 97.9 F (36.6 C) (Oral)  Resp 18  Ht 5\' 9"  (1.753 m)  Wt 57.1 kg (125 lb 14.1 oz)  BMI 18.58 kg/m2  SpO2 100%  Physical Exam: Gen: Comfortably resting  in bed, watching television CVS: Pulse regular in rate and rhythm, S1 and S2 normal Resp: Clear to auscultation, no rales Abd: Soft, flat, nontender Ext: 2+ lower extremity edema, left RCF with good thrill, low pitch bruit  Labs: BMET  Recent Labs Lab 02/29/16 1340 03/01/16 0755 03/02/16 0600 03/03/16 0704 03/04/16 0539 03/05/16 0610 03/06/16 0731  NA 139 139 139 137 138 136 137  K 3.6 3.4* 4.4 3.8 3.9 4.8 3.8  CL 104 104 100* 103 99* 100* 101  CO2 28 25 30 24 27 27 28    GLUCOSE 204* 146* 127* 65 37* 100* 130*  BUN 32* 19 15 28* 18 27* 17  CREATININE 4.04* 2.71* 2.74* 3.65* 2.49* 3.48* 2.65*  CALCIUM 7.6* 7.1* 7.7* 7.6* 8.0* 7.8* 7.6*  PHOS 3.9 2.8  --  2.9 2.4* 4.2 2.8   CBC  Recent Labs Lab 02/28/16 2200  03/01/16 0755  03/03/16 0704 03/04/16 0539 03/05/16 0610 03/06/16 0731  WBC 6.6  < > 2.8*  < > 6.6 8.0 7.7 6.4  NEUTROABS 4.6  --  1.6*  --   --   --   --   --   HGB 9.8*  < > 8.2*  < > 9.2* 10.1* 9.0* 8.3*  HCT 30.2*  < > 25.4*  < > 27.9* 33.1* 27.8* 26.3*  MCV 88.6  < > 88.8  < > 86.9 88.5 88.8 89.5  PLT 131*  < > 108*  < > 146* 186 152 149*  < > = values in this interval not displayed. Medications:    . aspirin EC  81 mg Oral Daily  . atorvastatin  10 mg Oral QPM  . citalopram  20 mg Oral Daily  . darbepoetin (ARANESP) injection - DIALYSIS  60 mcg Intravenous Q Fri-HD  . doxycycline  100 mg Oral Q12H  . fludrocortisone  0.1 mg Oral Daily  . folic acid  1 mg Oral Daily  . heparin  5,000 Units Subcutaneous 3 times per day  . insulin aspart  0-5 Units Subcutaneous QHS  . insulin aspart  0-9 Units Subcutaneous TID WC  . lisinopril  20 mg Oral Daily  . metoprolol succinate  50 mg Oral Daily  . multivitamin  1 tablet Oral QHS  . pantoprazole  20 mg Oral Daily  . sodium chloride flush  3 mL Intravenous Q12H  . vitamin B-12  1,000 mcg Oral Daily   Zetta Bills, MD 03/06/2016, 10:56 AM

## 2016-03-06 NOTE — Clinical Social Work Note (Addendum)
Clinical Social Work Assessment  Patient Details  Name: Jeffrey Walls MRN: [id number] Date of Birth: 1941-12-06  Date of referral:  03/03/16               Reason for consult:  Facility Placement                Permission sought to share information with:  Family Supports, Magazine features editoracility Contact Representative Permission granted to share information::  Yes, Verbal Permission Granted  Name::     Jeffrey Walls.    Agency::  Skilled nursing facilities  Relationship::  Walls  Contact Information:  [phone]  Housing/Transportation Living arrangements for the past 2 months:  Single Family Home Source of Information:  Patient Patient Interpreter Needed:  None Criminal Activity/Legal Involvement Pertinent to Current Situation/Hospitalization:  No - Comment as needed Significant Relationships:  Adult Children Lives with:  Self Do you feel safe going back to the place where you live?  No (Patient reported that he can't go home right now as he is very weak and can't get out of the bed without help.) Need for family participation in patient care:  No (Coment)  Care giving concerns: Patient expressed that he is very weak and cannot get out of bed without assistance.   Social Worker assessment / plan: On 03/05/16 CSW talked with patient regarding discharge planning. Jeffrey Walls was lying in bed, awake and alert and was open to talking with CSW.  Jeffrey Walls reported that he has 2 children and they both live in [place]. When asked, patient reported that they know that he is in the hospital. Patient fully comprehends that he needs rehab and is agreeable, however reported that he has never been to ST rehab before and knows nothing about the skilled facilities in [place].  Patient was given rehab list and [facility].   Employment status:  Retired Office managernsurance information:  Managed Medicare ([employer] HMO) PT Recommendations:  Skilled Nursing Facility Information / Referral  to community resources:  Skilled Nursing Facility (Patient provided with list of skilled nursing facilities in [place])  Patient/Family's Response to care:  No concerns expressed by patient regarding care during hospitalization.  Patient/Family's Understanding of and Emotional Response to Diagnosis, Current Treatment, and Prognosis:  Not discussed.  Emotional Assessment Appearance:  Appears stated age Attitude/Demeanor/Rapport:  Other (Appropriate) Affect (typically observed):  Pleasant Orientation:  Oriented to Self, Oriented to Place, Oriented to  Time, Oriented to Situation Alcohol / Substance use:  Never Used Psych involvement (Current and /or in the community):  No (Comment)  Discharge Needs  Concerns to be addressed:  Discharge Planning Concerns Readmission within the last 30 days:  No Current discharge risk:  None Barriers to Discharge:  No Barriers Identified   Jeffrey GoldmannCrawford, Jeffrey Calia Bradley, LCSW 03/06/2016, 12:08 PM

## 2016-03-06 NOTE — Progress Notes (Signed)
Physical Therapy Treatment Patient Details Name: Jeffrey Walls MRN: [id number] DOB: [birth year] Today's Date: 03/06/2016    History of Present Illness Patient is a 75 yo male admitted 02/28/16 with weakness, SOB.  Patient with volume overload - missed HD appointments.   PMH:  ESRD on HD, DM, HTN, CAD, frequent falls    PT Comments    Making progress, very motivated. Tolerated therapeutic exercises and improved endurance with gait training today. Patient will continue to benefit from skilled physical therapy services to further improve independence with functional mobility.   Follow Up Recommendations  SNF;Supervision for mobility/OOB     Equipment Recommendations  None recommended by PT    Recommendations for Other Services       Precautions / Restrictions Precautions Precautions: Fall Precaution Comments: Per chart, frequent falls at home. Restrictions Weight Bearing Restrictions: No    Mobility  Bed Mobility Overal bed mobility: Needs Assistance Bed Mobility: Sit to Supine;Supine to Sit     Supine to sit: Min assist Sit to supine: Supervision   General bed mobility comments: Pt did require min assist for truncal support to rise to EOB today with HOB flat. VC for technique. Supervision to return to supine without assist.  Transfers Overall transfer level: Needs assistance Equipment used: Rolling walker (2 wheeled);None Transfers: Sit to/from Stand Sit to Stand: Min guard         General transfer comment: Close guard for safety, practiced x4 with and without RW. VC for anterior weight shift, using Hands on knees to press up without RW.   Ambulation/Gait Ambulation/Gait assistance: Min assist Ambulation Distance (Feet): 65 Feet (x2) Assistive device: Rolling walker (2 wheeled);None Gait Pattern/deviations: Step-through pattern;Decreased stance time - left;Scissoring;Drifts right/left;Staggering left;Wide base of support;Trunk flexed Gait velocity:  decreased Gait velocity interpretation: Below normal speed for age/gender General Gait Details: Improved distance/endurance today. Still with intermittent loss of balance requiring very minimal assist to correct, improves with RW for support but still notable instability. VC for sequencing and awareness for safety.   Stairs            Wheelchair Mobility    Modified Rankin (Stroke Patients Only)       Balance                                    Cognition Arousal/Alertness: Awake/alert Behavior During Therapy: WFL for tasks assessed/performed Overall Cognitive Status: Within Functional Limits for tasks assessed                      Exercises General Exercises - Lower Extremity Ankle Circles/Pumps: AROM;Both;10 reps;Seated Quad Sets: Strengthening;Both;10 reps;Supine Long Arc Quad: Strengthening;Both;10 reps;Seated Hip ABduction/ADduction: Strengthening;Both;10 reps;Seated (adduction with pillow) Hip Flexion/Marching: Strengthening;Both;10 reps;Seated Mini-Sqauts: Strengthening;Both;10 reps;Standing (Moderate use of UEs on RW)    General Comments        Pertinent Vitals/Pain Pain Assessment: No/denies pain Pain Intervention(s): Monitored during session    Home Living                      Prior Function            PT Goals (current goals can now be found in the care plan section) Acute Rehab PT Goals Patient Stated Goal: None stated PT Goal Formulation: With patient Time For Goal Achievement: 03/08/16 Potential to Achieve Goals: Good Progress towards PT goals: Progressing toward goals    Frequency  Min 3X/week    PT Plan Current plan remains appropriate    Co-evaluation             End of Session Equipment Utilized During Treatment: Gait belt Activity Tolerance: Patient tolerated treatment well Patient left: with call bell/phone within reach;in bed;with bed alarm set     Time: 4696-2952 PT Time Calculation  (min) (ACUTE ONLY): 22 min  Charges:  $Gait Training: 8-22 mins                    G Codes:      Berton Mount 24-Mar-2016, 11:47 AM  Charlsie Merles, PT [phone]

## 2016-03-30 ENCOUNTER — Emergency Department (HOSPITAL_COMMUNITY): Payer: Medicare HMO

## 2016-03-30 ENCOUNTER — Observation Stay (HOSPITAL_COMMUNITY): Payer: Medicare HMO

## 2016-03-30 ENCOUNTER — Encounter (HOSPITAL_COMMUNITY): Payer: Self-pay | Admitting: Emergency Medicine

## 2016-03-30 ENCOUNTER — Inpatient Hospital Stay (HOSPITAL_COMMUNITY)
Admission: EM | Admit: 2016-03-30 | Discharge: 2016-04-11 | DRG: 314 | Disposition: A | Discharge status: Skilled Nursing Facility | Payer: Medicare HMO | Attending: Internal Medicine | Admitting: Internal Medicine

## 2016-03-30 DIAGNOSIS — E875 Hyperkalemia: Secondary | ICD-10-CM | POA: Diagnosis present

## 2016-03-30 DIAGNOSIS — R55 Syncope and collapse: Secondary | ICD-10-CM | POA: Diagnosis not present

## 2016-03-30 DIAGNOSIS — I2510 Atherosclerotic heart disease of native coronary artery without angina pectoris: Secondary | ICD-10-CM | POA: Diagnosis present

## 2016-03-30 DIAGNOSIS — B957 Other staphylococcus as the cause of diseases classified elsewhere: Secondary | ICD-10-CM | POA: Diagnosis present

## 2016-03-30 DIAGNOSIS — Z7982 Long term (current) use of aspirin: Secondary | ICD-10-CM

## 2016-03-30 DIAGNOSIS — R609 Edema, unspecified: Secondary | ICD-10-CM

## 2016-03-30 DIAGNOSIS — R296 Repeated falls: Secondary | ICD-10-CM | POA: Diagnosis present

## 2016-03-30 DIAGNOSIS — R7881 Bacteremia: Secondary | ICD-10-CM

## 2016-03-30 DIAGNOSIS — Z681 Body mass index (BMI) 19 or less, adult: Secondary | ICD-10-CM

## 2016-03-30 DIAGNOSIS — Z79899 Other long term (current) drug therapy: Secondary | ICD-10-CM

## 2016-03-30 DIAGNOSIS — D696 Thrombocytopenia, unspecified: Secondary | ICD-10-CM | POA: Diagnosis present

## 2016-03-30 DIAGNOSIS — D649 Anemia, unspecified: Secondary | ICD-10-CM | POA: Diagnosis present

## 2016-03-30 DIAGNOSIS — T827XXA Infection and inflammatory reaction due to other cardiac and vascular devices, implants and grafts, initial encounter: Principal | ICD-10-CM | POA: Diagnosis present

## 2016-03-30 DIAGNOSIS — I351 Nonrheumatic aortic (valve) insufficiency: Secondary | ICD-10-CM | POA: Diagnosis present

## 2016-03-30 DIAGNOSIS — R0902 Hypoxemia: Secondary | ICD-10-CM | POA: Diagnosis present

## 2016-03-30 DIAGNOSIS — E1122 Type 2 diabetes mellitus with diabetic chronic kidney disease: Secondary | ICD-10-CM | POA: Diagnosis present

## 2016-03-30 DIAGNOSIS — Z880 Allergy status to penicillin: Secondary | ICD-10-CM

## 2016-03-30 DIAGNOSIS — I330 Acute and subacute infective endocarditis: Secondary | ICD-10-CM | POA: Diagnosis present

## 2016-03-30 DIAGNOSIS — G8929 Other chronic pain: Secondary | ICD-10-CM | POA: Diagnosis present

## 2016-03-30 DIAGNOSIS — R778 Other specified abnormalities of plasma proteins: Secondary | ICD-10-CM | POA: Diagnosis present

## 2016-03-30 DIAGNOSIS — I5041 Acute combined systolic (congestive) and diastolic (congestive) heart failure: Secondary | ICD-10-CM | POA: Diagnosis present

## 2016-03-30 DIAGNOSIS — Z992 Dependence on renal dialysis: Secondary | ICD-10-CM

## 2016-03-30 DIAGNOSIS — N189 Chronic kidney disease, unspecified: Secondary | ICD-10-CM

## 2016-03-30 DIAGNOSIS — Z888 Allergy status to other drugs, medicaments and biological substances status: Secondary | ICD-10-CM

## 2016-03-30 DIAGNOSIS — E43 Unspecified severe protein-calorie malnutrition: Secondary | ICD-10-CM | POA: Diagnosis present

## 2016-03-30 DIAGNOSIS — E876 Hypokalemia: Secondary | ICD-10-CM | POA: Diagnosis present

## 2016-03-30 DIAGNOSIS — D631 Anemia in chronic kidney disease: Secondary | ICD-10-CM | POA: Diagnosis present

## 2016-03-30 DIAGNOSIS — J90 Pleural effusion, not elsewhere classified: Secondary | ICD-10-CM

## 2016-03-30 DIAGNOSIS — Z794 Long term (current) use of insulin: Secondary | ICD-10-CM

## 2016-03-30 DIAGNOSIS — Z452 Encounter for adjustment and management of vascular access device: Secondary | ICD-10-CM

## 2016-03-30 DIAGNOSIS — N186 End stage renal disease: Secondary | ICD-10-CM | POA: Diagnosis present

## 2016-03-30 DIAGNOSIS — E1121 Type 2 diabetes mellitus with diabetic nephropathy: Secondary | ICD-10-CM | POA: Diagnosis present

## 2016-03-30 DIAGNOSIS — R0602 Shortness of breath: Secondary | ICD-10-CM

## 2016-03-30 DIAGNOSIS — I4581 Long QT syndrome: Secondary | ICD-10-CM | POA: Diagnosis present

## 2016-03-30 DIAGNOSIS — M4646 Discitis, unspecified, lumbar region: Secondary | ICD-10-CM | POA: Diagnosis present

## 2016-03-30 DIAGNOSIS — I5043 Acute on chronic combined systolic (congestive) and diastolic (congestive) heart failure: Secondary | ICD-10-CM | POA: Diagnosis present

## 2016-03-30 DIAGNOSIS — M899 Disorder of bone, unspecified: Secondary | ICD-10-CM | POA: Diagnosis present

## 2016-03-30 DIAGNOSIS — I10 Essential (primary) hypertension: Secondary | ICD-10-CM | POA: Diagnosis present

## 2016-03-30 DIAGNOSIS — I272 Other secondary pulmonary hypertension: Secondary | ICD-10-CM | POA: Diagnosis present

## 2016-03-30 DIAGNOSIS — I132 Hypertensive heart and chronic kidney disease with heart failure and with stage 5 chronic kidney disease, or end stage renal disease: Secondary | ICD-10-CM | POA: Diagnosis present

## 2016-03-30 DIAGNOSIS — R7989 Other specified abnormal findings of blood chemistry: Secondary | ICD-10-CM

## 2016-03-30 HISTORY — DX: Arteritis, unspecified: I77.6

## 2016-03-30 HISTORY — DX: Antineutrophilic cytoplasmic antibody (ANCA) vasculitis: I77.82

## 2016-03-30 HISTORY — DX: Pneumocystosis: B59

## 2016-03-30 LAB — COMPREHENSIVE METABOLIC PANEL
ALK PHOS: 102 U/L (ref 38–126)
ALT: 19 U/L (ref 17–63)
ANION GAP: 12 (ref 5–15)
AST: 26 U/L (ref 15–41)
Albumin: 2.3 g/dL — ABNORMAL LOW (ref 3.5–5.0)
BUN: 12 mg/dL (ref 6–20)
CALCIUM: 8.1 mg/dL — AB (ref 8.9–10.3)
CO2: 28 mmol/L (ref 22–32)
CREATININE: 3.24 mg/dL — AB (ref 0.61–1.24)
Chloride: 97 mmol/L — ABNORMAL LOW (ref 101–111)
GFR, EST AFRICAN AMERICAN: 20 mL/min — AB (ref >60)
GFR, EST NON AFRICAN AMERICAN: 18 mL/min — AB (ref >60)
Glucose, Bld: 105 mg/dL — ABNORMAL HIGH (ref 65–99)
Potassium: 3.1 mmol/L — ABNORMAL LOW (ref 3.5–5.1)
SODIUM: 137 mmol/L (ref 135–145)
TOTAL PROTEIN: 4.3 g/dL — AB (ref 6.5–8.1)
Total Bilirubin: 1.2 mg/dL (ref 0.3–1.2)

## 2016-03-30 LAB — CREATININE, SERUM
CREATININE: 3.42 mg/dL — AB (ref 0.61–1.24)
GFR calc non Af Amer: 16 mL/min — ABNORMAL LOW (ref >60)
GFR, EST AFRICAN AMERICAN: 19 mL/min — AB (ref >60)

## 2016-03-30 LAB — FOLATE: Folate: 21.8 ng/mL (ref >5.9)

## 2016-03-30 LAB — CBC WITH DIFFERENTIAL/PLATELET
BASOS ABS: 0 10*3/uL (ref 0.0–0.1)
BASOS PCT: 0 %
EOS ABS: 0 10*3/uL (ref 0.0–0.7)
Eosinophils Relative: 0 %
HEMATOCRIT: 27.7 % — AB (ref 39.0–52.0)
HEMOGLOBIN: 8.8 g/dL — AB (ref 13.0–17.0)
Lymphocytes Relative: 16 %
Lymphs Abs: 1.3 10*3/uL (ref 0.7–4.0)
MCH: 27.8 pg (ref 26.0–34.0)
MCHC: 31.8 g/dL (ref 30.0–36.0)
MCV: 87.7 fL (ref 78.0–100.0)
Monocytes Absolute: 0.6 10*3/uL (ref 0.1–1.0)
Monocytes Relative: 7 %
NEUTROS ABS: 6.3 10*3/uL (ref 1.7–7.7)
NEUTROS PCT: 77 %
Platelets: 185 10*3/uL (ref 150–400)
RBC: 3.16 MIL/uL — AB (ref 4.22–5.81)
RDW: 18.1 % — ABNORMAL HIGH (ref 11.5–15.5)
WBC: 8.1 10*3/uL (ref 4.0–10.5)

## 2016-03-30 LAB — MAGNESIUM: Magnesium: 1.7 mg/dL (ref 1.7–2.4)

## 2016-03-30 LAB — CBC
HCT: 27.8 % — ABNORMAL LOW (ref 39.0–52.0)
Hemoglobin: 9 g/dL — ABNORMAL LOW (ref 13.0–17.0)
MCH: 28.6 pg (ref 26.0–34.0)
MCHC: 32.4 g/dL (ref 30.0–36.0)
MCV: 88.3 fL (ref 78.0–100.0)
PLATELETS: 182 10*3/uL (ref 150–400)
RBC: 3.15 MIL/uL — AB (ref 4.22–5.81)
RDW: 18.3 % — ABNORMAL HIGH (ref 11.5–15.5)
WBC: 7.6 10*3/uL (ref 4.0–10.5)

## 2016-03-30 LAB — I-STAT TROPONIN, ED: TROPONIN I, POC: 0.55 ng/mL — AB (ref 0.00–0.08)

## 2016-03-30 LAB — VITAMIN B12: Vitamin B-12: 2254 pg/mL — ABNORMAL HIGH (ref 180–914)

## 2016-03-30 LAB — IRON AND TIBC
Iron: 17 ug/dL — ABNORMAL LOW (ref 45–182)
Saturation Ratios: 13 % — ABNORMAL LOW (ref 17.9–39.5)
TIBC: 130 ug/dL — ABNORMAL LOW (ref 250–450)
UIBC: 113 ug/dL

## 2016-03-30 LAB — PROTIME-INR
INR: 1.27 (ref 0.00–1.49)
PROTHROMBIN TIME: 16.1 s — AB (ref 11.6–15.2)

## 2016-03-30 LAB — TROPONIN I: Troponin I: 0.51 ng/mL (ref <0.031)

## 2016-03-30 LAB — RETICULOCYTES
RBC.: 3.15 MIL/uL — AB (ref 4.22–5.81)
RETIC COUNT ABSOLUTE: 53.6 10*3/uL (ref 19.0–186.0)
Retic Ct Pct: 1.7 % (ref 0.4–3.1)

## 2016-03-30 LAB — PHOSPHORUS: Phosphorus: 3.1 mg/dL (ref 2.5–4.6)

## 2016-03-30 LAB — TSH: TSH: 1.124 u[IU]/mL (ref 0.350–4.500)

## 2016-03-30 LAB — APTT: APTT: 42 s — AB (ref 24–37)

## 2016-03-30 LAB — I-STAT CG4 LACTIC ACID, ED
Lactic Acid, Venous: 0.8 mmol/L (ref 0.5–2.0)
Lactic Acid, Venous: 0.79 mmol/L (ref 0.5–2.0)

## 2016-03-30 LAB — FERRITIN: FERRITIN: 2245 ng/mL — AB (ref 24–336)

## 2016-03-30 LAB — MRSA PCR SCREENING: MRSA by PCR: NEGATIVE

## 2016-03-30 MED ORDER — SODIUM CHLORIDE 0.9% FLUSH
3.0000 mL | Freq: Two times a day (BID) | INTRAVENOUS | Status: DC
Start: 2016-03-30 — End: 2016-04-11
  Administered 2016-03-30 – 2016-04-10 (×20): 3 mL via INTRAVENOUS

## 2016-03-30 MED ORDER — ENSURE ENLIVE PO LIQD: 237.0000 mL | Freq: Two times a day (BID) | ORAL | Status: DC

## 2016-03-30 MED ORDER — CITALOPRAM HYDROBROMIDE 20 MG PO TABS
20.0000 mg | ORAL_TABLET | Freq: Every day | ORAL | Status: DC
  Administered 2016-03-30 – 2016-04-10 (×12): 20 mg via ORAL
  Filled 2016-03-30 (×14): qty 1

## 2016-03-30 MED ORDER — MORPHINE SULFATE (PF) 2 MG/ML IV SOLN
1.0000 mg | INTRAVENOUS | Status: DC | PRN
  Administered 2016-03-31: 1 mg via INTRAVENOUS
  Filled 2016-03-30: qty 1

## 2016-03-30 MED ORDER — FLUDROCORTISONE ACETATE 0.1 MG PO TABS
0.1000 mg | ORAL_TABLET | Freq: Every day | ORAL | Status: DC
  Administered 2016-03-30 – 2016-04-05 (×7): 0.1 mg via ORAL
  Filled 2016-03-30 (×7): qty 1

## 2016-03-30 MED ORDER — ONDANSETRON HCL 4 MG/2ML IJ SOLN: 4.0000 mg | Freq: Four times a day (QID) | INTRAMUSCULAR | Status: DC | PRN

## 2016-03-30 MED ORDER — ACETAMINOPHEN 325 MG PO TABS
650.0000 mg | ORAL_TABLET | Freq: Four times a day (QID) | ORAL | Status: DC | PRN
  Administered 2016-04-01: 650 mg via ORAL
  Filled 2016-03-30: qty 2

## 2016-03-30 MED ORDER — ZOLPIDEM TARTRATE 5 MG PO TABS
5.0000 mg | ORAL_TABLET | Freq: Every evening | ORAL | Status: DC | PRN
Start: 2016-03-30 — End: 2016-04-11
  Administered 2016-03-30 – 2016-04-10 (×11): 5 mg via ORAL
  Filled 2016-03-30 (×10): qty 1

## 2016-03-30 MED ORDER — ONDANSETRON HCL 4 MG PO TABS: 4.0000 mg | ORAL_TABLET | Freq: Four times a day (QID) | ORAL | Status: DC | PRN

## 2016-03-30 MED ORDER — FOLIC ACID 1 MG PO TABS
1.0000 mg | ORAL_TABLET | Freq: Every day | ORAL | Status: DC
  Administered 2016-03-30 – 2016-04-10 (×12): 1 mg via ORAL
  Filled 2016-03-30 (×14): qty 1

## 2016-03-30 MED ORDER — PANTOPRAZOLE SODIUM 20 MG PO TBEC
20.0000 mg | DELAYED_RELEASE_TABLET | Freq: Every day | ORAL | Status: DC
  Administered 2016-03-30 – 2016-04-09 (×11): 20 mg via ORAL
  Filled 2016-03-30 (×14): qty 1

## 2016-03-30 MED ORDER — NEPRO/CARBSTEADY PO LIQD: 237.0000 mL | Freq: Two times a day (BID) | ORAL | Status: DC

## 2016-03-30 MED ORDER — ACETAMINOPHEN 650 MG RE SUPP: 650.0000 mg | Freq: Four times a day (QID) | RECTAL | Status: DC | PRN

## 2016-03-30 MED ORDER — POTASSIUM CHLORIDE CRYS ER 20 MEQ PO TBCR
40.0000 meq | EXTENDED_RELEASE_TABLET | Freq: Two times a day (BID) | ORAL | Status: DC
  Administered 2016-03-31 – 2016-04-02 (×5): 40 meq via ORAL
  Filled 2016-03-30 (×6): qty 2

## 2016-03-30 MED ORDER — ENOXAPARIN SODIUM 30 MG/0.3ML ~~LOC~~ SOLN
30.0000 mg | SUBCUTANEOUS | Status: DC
  Administered 2016-04-09: 30 mg via SUBCUTANEOUS
  Filled 2016-03-30 (×8): qty 0.3

## 2016-03-30 MED ORDER — METOPROLOL SUCCINATE ER 50 MG PO TB24
50.0000 mg | ORAL_TABLET | Freq: Every day | ORAL | Status: DC
  Administered 2016-03-30 – 2016-04-03 (×5): 50 mg via ORAL
  Filled 2016-03-30 (×5): qty 1

## 2016-03-30 MED ORDER — SENNOSIDES-DOCUSATE SODIUM 8.6-50 MG PO TABS
1.0000 | ORAL_TABLET | Freq: Every evening | ORAL | Status: DC | PRN
  Administered 2016-04-09: 1 via ORAL
  Filled 2016-03-30 (×2): qty 1

## 2016-03-30 MED ORDER — ASPIRIN EC 81 MG PO TBEC
81.0000 mg | DELAYED_RELEASE_TABLET | Freq: Every day | ORAL | Status: DC
  Administered 2016-03-31 – 2016-04-10 (×11): 81 mg via ORAL
  Filled 2016-03-30 (×14): qty 1

## 2016-03-30 NOTE — ED Notes (Signed)
Pt has had increased weakness since dec when he started dialysis. Pt fell today at home and called EMS. Denies LOC. Pt has chronic back pain. A/O x4. BP 174/80, HR 85, 97% on room air. CBG 139. Pt had dialysis on Friday (MWF schedule)

## 2016-03-30 NOTE — Progress Notes (Signed)
Patient arrived on unit via stretcher from ED.  No family at bedside. Patient stated that he is missing his duffle bag.  Per Sharen HeckAmanda Straton EMT, patient did not have a duffle bag when he arrived in the ED.  Telemetry placed per MD order and CMT notified.

## 2016-03-30 NOTE — ED Notes (Signed)
Pt states he has a "purple duffle-bag with clothes" at bedside. RN unable to locate duffle bag. Belongings at bedside in patient belongings bag. Pt continues to state, "I brought it in with me!" Medications and clothes sent to floor with patient. No duffle-bag located.

## 2016-03-30 NOTE — Progress Notes (Signed)
Called ED for report.  Spoke with [employer].

## 2016-03-30 NOTE — Progress Notes (Signed)
Per service response dinner ordered and will be coming up.  Per patient.  He wants to wait until he eats before taking PO medication.

## 2016-03-30 NOTE — ED Notes (Signed)
Pt daughter called. Kathi Simpersara Lowery phone # [phone].

## 2016-03-30 NOTE — ED Provider Notes (Signed)
CSN: [id number]     Arrival date & time 03/30/16  1240 History   First MD Initiated Contact with Patient 03/30/16 1420     Chief Complaint  Patient presents with  . Weakness     (Consider location/radiation/quality/duration/timing/severity/associated sxs/prior Treatment) HPI   Theon Sobotka is a 75 y.o. male, with a history of renal failure on dialysis, CAD, DM, and hypertension, presenting to the ED with a complaint of two episodes of syncope. One event occurred last night and one this morning. Pt states he wasn't doing any special activity at the time, he states he was just walking through his house. Pt states, "I think my blood pressure dropped. I've had problems with that before." Pt states he has been generally weak since he started dialysis in December. Pt attends dialysis MWF, and states he attended this past Friday as well. Despite his need for dialysis, patient states that he still makes some urine. Patient's only current complaints are generalized weakness and his chronic back pain. Patient rates his back pain at 9 out of 10, aching, nonradiating. Patient denies changes in his medications. Patient denies chest pain, shortness of breath, recent illness, nausea/vomiting, fever/chills, or any other complaints.    Past Medical History  Diagnosis Date  . Renal disorder   . Hypertension   . Diabetes mellitus without complication (HCC)   . Coronary artery disease   . Shortness of breath dyspnea    Past Surgical History  Procedure Laterality Date  . Av fistula placement Left 03/04/2016    Procedure: ARTERIOVENOUS (AV) FISTULA CREATION LEFT RADIOCEPHALIC;  Surgeon: Chuck Hint, MD;  Location: [facility] OR;  Service: Vascular;  Laterality: Left;   History reviewed. No pertinent family history. Social History  Substance Use Topics  . Smoking status: Never Smoker   . Smokeless tobacco: None  . Alcohol Use: No    Review of Systems  Constitutional: Negative for fever and chills.   Respiratory: Negative for shortness of breath.   Cardiovascular: Negative for chest pain.  Gastrointestinal: Negative for nausea and vomiting.  Skin: Positive for wound. Negative for color change and pallor.  Neurological: Positive for syncope and weakness (generalized). Negative for dizziness, light-headedness, numbness and headaches.  All other systems reviewed and are negative.     Allergies  Ampicillin and Benadryl  Home Medications   Prior to Admission medications   Medication Sig Start Date End Date Taking? Authorizing Provider  aspirin EC 81 MG tablet Take 81 mg by mouth daily.   Yes Historical Provider, MD  citalopram (CELEXA) 20 MG tablet Take 20 mg by mouth daily.   Yes Historical Provider, MD  FLUDROCORTISONE ACETATE PO Take 0.1 mg by mouth daily.   Yes Historical Provider, MD  folic acid (FOLVITE) 1 MG tablet Take 1 mg by mouth daily. 08/30/15  Yes Historical Provider, MD  Insulin Glargine (LANTUS SOLOSTAR) 100 UNIT/ML Solostar Pen Inject 5 Units into the skin every morning.   Yes Historical Provider, MD  lisinopril (PRINIVIL,ZESTRIL) 20 MG tablet Take 1 tablet (20 mg total) by mouth daily. 03/06/16  Yes Dorothea Ogle, MD  metoprolol succinate (TOPROL-XL) 50 MG 24 hr tablet Take 50 mg by mouth daily. 02/04/16  Yes Historical Provider, MD  oxyCODONE-acetaminophen (PERCOCET/ROXICET) 5-325 MG tablet Take 1-2 tablets by mouth every 4 (four) hours as needed for severe pain. 03/06/16  Yes Dorothea Ogle, MD  pantoprazole (PROTONIX) 20 MG tablet Take 20 mg by mouth daily. 08/18/15 08/17/16 Yes Historical Provider, MD  vitamin  B-12 (CYANOCOBALAMIN) 1000 MCG tablet Place 1,000 mcg under the tongue daily.    Yes Historical Provider, MD  zolpidem (AMBIEN) 5 MG tablet Take 1 tablet (5 mg total) by mouth at bedtime as needed. Sleep 03/06/16  Yes [name], MD   BP 164/78 mmHg  Pulse 80  Temp(Src) 99.9 F (37.7 C) (Oral)  Resp 12  Wt 58.514 kg  SpO2 98% Physical Exam  Constitutional:  He is oriented to person, place, and time. He appears well-developed and well-nourished. No distress.  HENT:  Head: Normocephalic and atraumatic.  Mouth/Throat: Oropharynx is clear and moist.  Eyes: Conjunctivae and EOM are normal. Pupils are equal, round, and reactive to light.  Neck: Normal range of motion. Neck supple.  Cardiovascular: Normal rate, regular rhythm, normal heart sounds and intact distal pulses.   Pulmonary/Chest: Effort normal and breath sounds normal. No respiratory distress.  Abdominal: Soft. There is no tenderness. There is no guarding.  Musculoskeletal: He exhibits no edema or tenderness.  Tenderness to the lumbar musculature bilaterally. Full ROM in all extremities. No paraspinal tenderness.   Lymphadenopathy:    He has no cervical adenopathy.  Neurological: He is alert and oriented to person, place, and time. He has normal reflexes.  No sensory deficits. Strength 5/5 in all extremities. Gait testing deferred Coordination intact. Cranial nerves III-XII grossly intact. No facial droop.   Skin: Skin is warm and dry. He is not diaphoretic.  Small skin tear to the left forearm. No active hemorrhage.  Psychiatric: He has a normal mood and affect. His behavior is normal.  Nursing note and vitals reviewed.   ED Course  Procedures (including critical care time) Labs Review Labs Reviewed  COMPREHENSIVE METABOLIC PANEL - Abnormal; Notable for the following:    Potassium 3.1 (*)    Chloride 97 (*)    Glucose, Bld 105 (*)    Creatinine, Ser 3.24 (*)    Calcium 8.1 (*)    Total Protein 4.3 (*)    Albumin 2.3 (*)    GFR calc non Af Amer 18 (*)    GFR calc Af Amer 20 (*)    All other components within normal limits  CBC WITH DIFFERENTIAL/PLATELET - Abnormal; Notable for the following:    RBC 3.16 (*)    Hemoglobin 8.8 (*)    HCT 27.7 (*)    RDW 18.1 (*)    All other components within normal limits  PROTIME-INR - Abnormal; Notable for the following:    Prothrombin  Time 16.1 (*)    All other components within normal limits  APTT - Abnormal; Notable for the following:    aPTT 42 (*)    All other components within normal limits  I-STAT TROPOININ, ED - Abnormal; Notable for the following:    Troponin i, poc 0.55 (*)    All other components within normal limits  URINE CULTURE  URINALYSIS, ROUTINE W REFLEX MICROSCOPIC (NOT AT [facility])  URINE RAPID DRUG SCREEN, HOSP PERFORMED  I-STAT CG4 LACTIC ACID, ED    Imaging Review No results found. I have personally reviewed and evaluated these lab results as part of my medical decision-making.   EKG Interpretation   Date/Time:  Sunday March 30 2016 12:55:54 EDT Ventricular Rate:  82 PR Interval:  151 QRS Duration: 95 QT Interval:  463 QTC Calculation: 541 R Axis:   57 Text Interpretation:  Sinus rhythm Repol abnrm suggests ischemia, diffuse  leads Prolonged QT interval No significant change since last tracing  Confirmed  by Tyrone Apple ([id number]) on 03/30/2016 3:18:24 PM      Orthostatic VS for the past 24 hrs:  BP- Lying Pulse- Lying BP- Sitting Pulse- Sitting BP- Standing at 0 minutes Pulse- Standing at 0 minutes  03/30/16 1446 165/76 mmHg 79 162/73 mmHg 80 122/62 mmHg 86   Medications  aspirin EC tablet 81 mg (not administered)  citalopram (CELEXA) tablet 20 mg (not administered)  folic acid (FOLVITE) tablet 1 mg (not administered)  metoprolol succinate (TOPROL-XL) 24 hr tablet 50 mg (not administered)  pantoprazole (PROTONIX) EC tablet 20 mg (not administered)  zolpidem (AMBIEN) tablet 5 mg (not administered)  fludrocortisone (FLORINEF) tablet 0.1 mg (not administered)  sodium chloride flush (NS) 0.9 % injection 3 mL (not administered)  enoxaparin (LOVENOX) injection 40 mg (not administered)  acetaminophen (TYLENOL) tablet 650 mg (not administered)    Or  acetaminophen (TYLENOL) suppository 650 mg (not administered)  morphine 2 MG/ML injection 1 mg (not administered)  senna-docusate  (Senokot-S) tablet 1 tablet (not administered)  ondansetron (ZOFRAN) tablet 4 mg (not administered)    Or  ondansetron (ZOFRAN) injection 4 mg (not administered)  potassium chloride SA (K-DUR,KLOR-CON) CR tablet 40 mEq (not administered)   Orders Placed This Encounter  Procedures  . Urine culture  . CT Head Wo Contrast  . CT Cervical Spine Wo Contrast  . DG Lumbar Spine Complete  . DG Thoracic Spine 2 View  . DG Chest 1 View  . Comprehensive metabolic panel  . CBC with Differential  . Protime-INR  . APTT  . Urinalysis, Routine w reflex microscopic  . Urine rapid drug screen (hosp performed)  . CBC  . Creatinine, serum  . Creatinine, serum  . TSH  . Hemoglobin A1c  . Basic metabolic panel  . CBC  . Magnesium  . Vitamin B12  . Folate  . Iron and TIBC  . Ferritin  . Reticulocytes  . Diet heart healthy/carb modified Room service appropriate?: Yes; Fluid consistency:: Thin  . Orthostatic vital signs  . Cardiac monitoring  . Maintain IV access  . Vital signs  . Orthostatic vital signs on admission  . Notify physician  . Daily weights  . Intake and output  . SCDs  . Up with assistance  . Full code  . Consult to hospitalist  . I-Stat Troponin, ED (not at [facility])  . I-Stat CG4 Lactic Acid, ED  . ED EKG  . EKG 12-Lead  . Echocardiogram  . Place in observation (patient's expected length of stay will be less than 2 midnights)  . Place in observation (patient's expected length of stay will be less than 2 midnights)     MDM   Final diagnoses:  Syncope, unspecified syncope type    Christianne Dolin presents with two episodes of syncope, one last night and one this morning.  Findings and plan of care discussed with Tilden Fossa, MD. Dr. Madilyn Hook personally evaluated and examined this patient.  Although the patient states that he is chronically weak, he has no unilateral weakness noted on exam. No focal neural deficits. Syncope workup initiated. Patient's only current  complaint is back pain, which he says is chronic. Troponin level is noted and may possibly be chronically elevated, however patient will require trending troponins. Patient to be admitted for syncope workup. Any other lab abnormalities will be addressed by the hospitalist service.  4:06 PM Spoke with Toya Smothers, NP from the hospitalist service. Agreed to admit patient for syncope workup to telemetry observation under Dr. Melynda Ripple  as the attending.   Filed Vitals:   03/30/16 1246 03/30/16 1400 03/30/16 1430 03/30/16 1445  BP: 169/79 158/78 159/86 164/78  Pulse:  81 83 80  Temp: 99.9 F (37.7 C)     TempSrc: Oral     Resp: 10 16 21 12   Weight: 58.514 kg     SpO2: 96% 91% 97% 98%      Anselm Pancoast, PA-C 03/30/16 1638  Tilden Fossa, MD 04/07/16 0900

## 2016-03-30 NOTE — H&P (Signed)
[facility] History and Physical  Julyan Gales ZOX:[id number] DOB: Jan 04, 1941 DOA: 03/30/2016  Referring physician:  PCP: Theressa Stamps, MD   Chief Complaint: syncope  HPI: Jeffrey Walls is a 75 y.o. male with a past medical history that includes diabetes type 2, hypertension, coronary artery disease, end-stage renal disease on Monday Wednesday Friday dialysis presents to the emergency Department chief complaint of syncope. Initial evaluation reveals elevated troponin, hypokalemia.  Information obtained from the patient and the chart. Patient has had increased weakness since December 2016 when he started dialysis. Chart review indicates he's had multiple falls since that time. He was in the hospital in March for volume overload and placement of AV fistula. He was discharged to facility for rehabilitation March 23. Today while at home reports sudden loss of consciousness. He denies having just sat up or stood up. He reports ambulating down the hall and he loss consciousness. he  reports episode similar yesterday. He states "I think blood pressure dropped because I have had problems with that before. He denies any recent medication changes. Denies chest pain palpitations headache dizziness. He denies any fever chills nausea vomiting diarrhea. He denies abdominal pain nausea vomiting diarrhea. He denies any lower extremity edema shortness of breath cough. He reports that he does make urine on occasion denies any dysuria hematuria. He reports chronic back pain is unchanged.  In the emergency department he's afebrile hemodynamically stable and not hypoxic. Review of Systems:  10 point review of systems complete and all systems are negative except as indicated in the history of present illness  Past Medical History  Diagnosis Date  . Renal disorder   . Hypertension   . Diabetes mellitus without complication (HCC)   . Coronary artery disease   . Shortness of breath dyspnea    Past  Surgical History  Procedure Laterality Date  . Av fistula placement Left 03/04/2016    Procedure: ARTERIOVENOUS (AV) FISTULA CREATION LEFT RADIOCEPHALIC;  Surgeon: Chuck Hint, MD;  Location: [facility] OR;  Service: Vascular;  Laterality: Left;   Social History:  reports that he has never smoked. He does not have any smokeless tobacco history on file. He reports that he does not drink alcohol or use illicit drugs.  Allergies  Allergen Reactions  . Ampicillin Rash    Ankle swelling  . Benadryl [Diphenhydramine Hcl] Itching    Rash    History reviewed. No pertinent family history. family medical history reviewed. Positive for hypertension diabetes kidney disease  Prior to Admission medications   Medication Sig Start Date End Date Taking? Authorizing Provider  aspirin EC 81 MG tablet Take 81 mg by mouth daily.   Yes Historical Provider, MD  citalopram (CELEXA) 20 MG tablet Take 20 mg by mouth daily.   Yes Historical Provider, MD  FLUDROCORTISONE ACETATE PO Take 0.1 mg by mouth daily.   Yes Historical Provider, MD  folic acid (FOLVITE) 1 MG tablet Take 1 mg by mouth daily. 08/30/15  Yes Historical Provider, MD  Insulin Glargine (LANTUS SOLOSTAR) 100 UNIT/ML Solostar Pen Inject 5 Units into the skin every morning.   Yes Historical Provider, MD  lisinopril (PRINIVIL,ZESTRIL) 20 MG tablet Take 1 tablet (20 mg total) by mouth daily. 03/06/16  Yes Dorothea Ogle, MD  metoprolol succinate (TOPROL-XL) 50 MG 24 hr tablet Take 50 mg by mouth daily. 02/04/16  Yes Historical Provider, MD  oxyCODONE-acetaminophen (PERCOCET/ROXICET) 5-325 MG tablet Take 1-2 tablets by mouth every 4 (four) hours as needed for severe pain. 03/06/16  Yes  Dorothea Ogle, MD  pantoprazole (PROTONIX) 20 MG tablet Take 20 mg by mouth daily. 08/18/15 08/17/16 Yes Historical Provider, MD  vitamin B-12 (CYANOCOBALAMIN) 1000 MCG tablet Place 1,000 mcg under the tongue daily.    Yes Historical Provider, MD  zolpidem (AMBIEN) 5 MG tablet  Take 1 tablet (5 mg total) by mouth at bedtime as needed. Sleep 03/06/16  Yes Dorothea Ogle, MD   Physical Exam: Filed Vitals:   03/30/16 1246 03/30/16 1400 03/30/16 1430 03/30/16 1445  BP: 169/79 158/78 159/86 164/78  Pulse:  81 83 80  Temp: 99.9 F (37.7 C)     TempSrc: Oral     Resp: Weight: 58.514 kg (129 lb)     SpO2: 96% 91% 97% 98%    Wt Readings from Last 3 Encounters:  03/30/16 58.514 kg (129 lb)  03/05/16 57.1 kg (125 lb 14.1 oz)  12/26/15 60 kg (132 lb 4.4 oz)    General:  Appears calm and comfortable Eyes: PERRL, normal lids, irises & conjunctiva ENT: grossly normal hearing, lips & tongue Neck: no LAD, masses or thyromegaly Cardiovascular: RRR, no Walls/r/g. No LE edema. Telemetry: SR, no arrhythmias  Respiratory: CTA bilaterally, no w/r/r. Normal respiratory effort. Abdomen: soft, ntnd is a bowel sounds nontender to palpation Skin: no rash or induration seen on limited exam Musculoskeletal: grossly normal tone BUE/BLE Psychiatric: grossly normal mood and affect, speech fluent and appropriate Neurologic: grossly non-focal. Alert and oriented 3           Labs on Admission:  Basic Metabolic Panel:  Recent Labs Lab 03/30/16 1447  NA 137  K 3.1*  CL 97*  CO2 28  GLUCOSE 105*  BUN 12  CREATININE 3.24*  CALCIUM 8.1*   Liver Function Tests:  Recent Labs Lab 03/30/16 1447  AST 26  ALT 19  ALKPHOS 102  BILITOT 1.2  PROT 4.3*  ALBUMIN 2.3*   No results for input(s): LIPASE, AMYLASE in the last 168 hours. No results for input(s): AMMONIA in the last 168 hours. CBC:  Recent Labs Lab 03/30/16 1447  WBC 8.1  NEUTROABS 6.3  HGB 8.8*  HCT 27.7*  MCV 87.7  PLT 185   Cardiac Enzymes: No results for input(s): CKTOTAL, CKMB, CKMBINDEX, TROPONINI in the last 168 hours.  BNP (last 3 results)  Recent Labs  02/28/16 2200  BNP >4500.0*    ProBNP (last 3 results) No results for input(s): PROBNP in the last 8760 hours.  CBG: No  results for input(s): GLUCAP in the last 168 hours.  Radiological Exams on Admission: No results found.  EKG: Independently reviewed. Sinus rhythm Repol abnrm suggests ischemia, diffuse leads Prolonged QT interval No significant change since last tracing  Assessment/Plan Principal Problem:   Syncope Active Problems:   ESRD on hemodialysis (HCC)   Anemia of renal disease   Frequent falls   Essential hypertension   Diabetes mellitus with diabetic nephropathy, with long-term current use of insulin (HCC)   Elevated troponin  #1. Syncope and collapse. Patient reports 2 episodes. Etiology unclear. CT head pending. EKG without acute changes. Does have an elevated troponin but also end-stage renal disease on dialysis. Denies chest pain palpitation. Location of any infectious process. No metabolic derangement -Admit to telemetry -Obtain 2-D echo, chest x-ray -TSH -Tech orthostatic vital signs -Hold home lisinopril and oxycodone for now  #2. Elevated troponin. Initial troponin o.55 in patient with end-stage renal disease. Denies chest pain palpitations. EKG without acute changes. -Monitor  on telemetry -Cycle troponin -Serial EKG  #3. End-stage renal disease. Creatinine on admission close to baseline Range On dialysis Monday Wednesday and Friday. Not missed any dialysis. Chart review indicates 825 pounds on discharge March 22. Potassium 3.1 on admission -Nephrology consult  #4. Hypertension. Fair control. Home medications include Cipro, metoprolol -We'll hold lisinopril for now -Continue beta blocker  #5 anemia chronic illness. Hemoglobin 8.8 on admission. Chart review indicates trending down over the last 3 weeks. No signs symptoms of active bleed -FOBT -Anemia panel -[place] home meds  6. Frequent falls. Last hospitalization patient evaluated by physical therapy who recommended SNIF rehabilitation. He was discharged from their March 23. -Physical therapy  [place] with  nephrology  Code Status:  full DVT Prophylaxis: Family Communication: none present Disposition Plan: home when ready  Time spent: 65 minutes  Jeffrey Walls,Jeffrey Walls [facility]

## 2016-03-31 DIAGNOSIS — R296 Repeated falls: Secondary | ICD-10-CM

## 2016-03-31 DIAGNOSIS — E0821 Diabetes mellitus due to underlying condition with diabetic nephropathy: Secondary | ICD-10-CM | POA: Diagnosis not present

## 2016-03-31 DIAGNOSIS — N186 End stage renal disease: Secondary | ICD-10-CM | POA: Diagnosis not present

## 2016-03-31 DIAGNOSIS — I10 Essential (primary) hypertension: Secondary | ICD-10-CM | POA: Diagnosis not present

## 2016-03-31 DIAGNOSIS — R55 Syncope and collapse: Secondary | ICD-10-CM

## 2016-03-31 DIAGNOSIS — I5041 Acute combined systolic (congestive) and diastolic (congestive) heart failure: Secondary | ICD-10-CM | POA: Diagnosis not present

## 2016-03-31 DIAGNOSIS — R7881 Bacteremia: Secondary | ICD-10-CM | POA: Diagnosis not present

## 2016-03-31 DIAGNOSIS — E8770 Fluid overload, unspecified: Secondary | ICD-10-CM | POA: Diagnosis not present

## 2016-03-31 DIAGNOSIS — D631 Anemia in chronic kidney disease: Secondary | ICD-10-CM | POA: Diagnosis not present

## 2016-03-31 LAB — CBC
HEMATOCRIT: 28 % — AB (ref 39.0–52.0)
Hemoglobin: 9 g/dL — ABNORMAL LOW (ref 13.0–17.0)
MCH: 28.1 pg (ref 26.0–34.0)
MCHC: 32.1 g/dL (ref 30.0–36.0)
MCV: 87.5 fL (ref 78.0–100.0)
PLATELETS: 180 10*3/uL (ref 150–400)
RBC: 3.2 MIL/uL — ABNORMAL LOW (ref 4.22–5.81)
RDW: 18.1 % — AB (ref 11.5–15.5)
WBC: 6.4 10*3/uL (ref 4.0–10.5)

## 2016-03-31 LAB — HEMOGLOBIN A1C
Hgb A1c MFr Bld: 5.8 % — ABNORMAL HIGH (ref 4.8–5.6)
MEAN PLASMA GLUCOSE: 120 mg/dL

## 2016-03-31 LAB — GLUCOSE, CAPILLARY
GLUCOSE-CAPILLARY: 100 mg/dL — AB (ref 65–99)
GLUCOSE-CAPILLARY: 149 mg/dL — AB (ref 65–99)
Glucose-Capillary: 110 mg/dL — ABNORMAL HIGH (ref 65–99)

## 2016-03-31 LAB — URINALYSIS, ROUTINE W REFLEX MICROSCOPIC
GLUCOSE, UA: 250 mg/dL — AB
KETONES UR: 15 mg/dL — AB
LEUKOCYTES UA: NEGATIVE
Nitrite: NEGATIVE
PH: 6 (ref 5.0–8.0)
SPECIFIC GRAVITY, URINE: 1.03 (ref 1.005–1.030)

## 2016-03-31 LAB — BASIC METABOLIC PANEL
Anion gap: 10 (ref 5–15)
BUN: 17 mg/dL (ref 6–20)
CHLORIDE: 97 mmol/L — AB (ref 101–111)
CO2: 31 mmol/L (ref 22–32)
CREATININE: 3.72 mg/dL — AB (ref 0.61–1.24)
Calcium: 8 mg/dL — ABNORMAL LOW (ref 8.9–10.3)
GFR calc Af Amer: 17 mL/min — ABNORMAL LOW (ref >60)
GFR, EST NON AFRICAN AMERICAN: 15 mL/min — AB (ref >60)
GLUCOSE: 115 mg/dL — AB (ref 65–99)
POTASSIUM: 2.9 mmol/L — AB (ref 3.5–5.1)
SODIUM: 138 mmol/L (ref 135–145)

## 2016-03-31 LAB — OCCULT BLOOD X 1 CARD TO LAB, STOOL: Fecal Occult Bld: NEGATIVE

## 2016-03-31 LAB — URINE MICROSCOPIC-ADD ON

## 2016-03-31 LAB — TROPONIN I: Troponin I: 0.49 ng/mL — ABNORMAL HIGH (ref <0.031)

## 2016-03-31 LAB — RAPID URINE DRUG SCREEN, HOSP PERFORMED
Amphetamines: NOT DETECTED
BARBITURATES: NOT DETECTED
BENZODIAZEPINES: NOT DETECTED
COCAINE: NOT DETECTED
Opiates: NOT DETECTED
Tetrahydrocannabinol: NOT DETECTED

## 2016-03-31 LAB — HEPATITIS B SURFACE ANTIGEN: Hepatitis B Surface Ag: NEGATIVE

## 2016-03-31 MED ORDER — BOOST / RESOURCE BREEZE PO LIQD
1.0000 | Freq: Two times a day (BID) | ORAL | Status: DC
  Administered 2016-04-01 – 2016-04-05 (×3): 1 via ORAL

## 2016-03-31 MED ORDER — SODIUM CHLORIDE 0.9 % IV SOLN: 100.0000 mL | INTRAVENOUS | Status: DC | PRN

## 2016-03-31 MED ORDER — PENTAFLUOROPROP-TETRAFLUOROETH EX AERO: 1.0000 "application " | INHALATION_SPRAY | CUTANEOUS | Status: DC | PRN

## 2016-03-31 MED ORDER — INSULIN ASPART 100 UNIT/ML ~~LOC~~ SOLN
0.0000 [IU] | Freq: Three times a day (TID) | SUBCUTANEOUS | Status: DC
Start: 2016-03-31 — End: 2016-04-11
  Administered 2016-04-01 – 2016-04-03 (×3): 1 [IU] via SUBCUTANEOUS
  Administered 2016-04-03 – 2016-04-04 (×2): 2 [IU] via SUBCUTANEOUS
  Administered 2016-04-05 – 2016-04-09 (×2): 1 [IU] via SUBCUTANEOUS

## 2016-03-31 MED ORDER — DARBEPOETIN ALFA 60 MCG/0.3ML IJ SOSY
60.0000 ug | PREFILLED_SYRINGE | INTRAMUSCULAR | Status: DC
  Administered 2016-03-31: 60 ug via INTRAVENOUS
  Filled 2016-03-31: qty 0.3

## 2016-03-31 MED ORDER — HEPARIN SODIUM (PORCINE) 1000 UNIT/ML DIALYSIS: 1000.0000 [IU] | INTRAMUSCULAR | Status: DC | PRN

## 2016-03-31 MED ORDER — PRO-STAT SUGAR FREE PO LIQD
30.0000 mL | Freq: Every day | ORAL | Status: DC
  Administered 2016-03-31 – 2016-04-07 (×5): 30 mL via ORAL
  Filled 2016-03-31 (×7): qty 30

## 2016-03-31 MED ORDER — SODIUM CHLORIDE 0.9 % IV SOLN
125.0000 mg | INTRAVENOUS | Status: DC
  Administered 2016-03-31 – 2016-04-02 (×2): 125 mg via INTRAVENOUS
  Filled 2016-03-31 (×6): qty 10

## 2016-03-31 MED ORDER — LIDOCAINE-PRILOCAINE 2.5-2.5 % EX CREA: 1.0000 "application " | TOPICAL_CREAM | CUTANEOUS | Status: DC | PRN

## 2016-03-31 MED ORDER — ALTEPLASE 2 MG IJ SOLR: 2.0000 mg | Freq: Once | INTRAMUSCULAR | Status: DC | PRN

## 2016-03-31 MED ORDER — LIDOCAINE HCL (PF) 1 % IJ SOLN: 5.0000 mL | INTRAMUSCULAR | Status: DC | PRN

## 2016-03-31 MED ORDER — DARBEPOETIN ALFA 60 MCG/0.3ML IJ SOSY
PREFILLED_SYRINGE | INTRAMUSCULAR | Status: AC
  Administered 2016-03-31: 60 ug via INTRAVENOUS
  Filled 2016-03-31: qty 0.3

## 2016-03-31 NOTE — Progress Notes (Signed)
PROGRESS NOTE  Jeffrey Walls ZOX:[id number] DOB: Feb 04, 1941 DOA: 03/30/2016 PCP: Theressa Stamps, MD  HPI/Recap of past 19 hours:  75 year old male with past mental history of diabetes, hypertension and end-stage renal disease who was recently discharged from skilled nursing facility for rehabilitation and had been having increased weakness as well as came in following a syncopal episode. This has been the second one since patient finished inpatient rehabilitation. The emergency room, labs were unrevealing except for mildly elevated troponin, although this is in the setting of end-stage renal disease. Patient has had no symptoms of chest pain.  Seen today and feeling okay with no complaints. No dizziness or lightheadedness  Assessment/Plan: Principal Problem:   Syncope: Unclear etiology. Echocardiogram and orthostatics ordered. Patient states that when it happens, it is after exertion and he feels like he is somewhat short of breath. He is having normal oxygen saturations on room air. Question if this is orthostasis from dialysis Active Problems:   ESRD on hemodialysis [facility]): Consulted nephrology. Next dialysis session for today   Anemia of renal disease: Management as per nephrology   Frequent falls   Essential hypertension: Blood pressure stable, slightly elevated after holding his ACE inhibitor.   Diabetes mellitus with diabetic nephropathy, with long-term current use of insulin (HCC): Sliding scale. Checking A1c 20 no episodes of hypoglycemic be complicating this   Elevated troponin   Code Status: Full code  Family Communication: Left message for family  Disposition Plan: Potential discharge in next 24-48 hours    Consultants:  Nephrology  Procedures:  Dialysis Monday Wednesday Friday   Antibiotics:  None   Objective: BP 164/70 mmHg  Pulse 72  Temp(Src) 99.6 F (37.6 C) (Oral)  Resp 17  Wt 58.514 kg (129 lb)  SpO2 91%  Intake/Output Summary (Last 24 hours) at  03/31/16 1359 Last data filed at 03/31/16 1004  Gross per 24 hour  Intake    240 ml  Output     50 ml  Net    190 ml   Filed Weights   03/30/16 1246  Weight: 58.514 kg (129 lb)    Exam:   General:  Alert and oriented 3   Cardiovascular: Regular rate and rhythm, S1-S2   Respiratory: Clear to auscultation bilaterally   Abdomen: Soft, nontender, nondistended, positive bowel sounds  Musculoskeletal: No clubbing or cyanosis, trace edema \  Data Reviewed: Basic Metabolic Panel:  Recent Labs Lab 03/30/16 1447 03/30/16 1649 03/31/16 0452  NA 137  --  138  K 3.1*  --  2.9*  CL 97*  --  97*  CO2 28  --  31  GLUCOSE 105*  --  115*  BUN 12  --  17  CREATININE 3.24* 3.42* 3.72*  CALCIUM 8.1*  --  8.0*  MG  --  1.7  --   PHOS  --  3.1  --    Liver Function Tests:  Recent Labs Lab 03/30/16 1447  AST 26  ALT 19  ALKPHOS 102  BILITOT 1.2  PROT 4.3*  ALBUMIN 2.3*   No results for input(s): LIPASE, AMYLASE in the last 168 hours. No results for input(s): AMMONIA in the last 168 hours. CBC:  Recent Labs Lab 03/30/16 1447 03/30/16 1649 03/31/16 0452  WBC 8.1 7.6 6.4  NEUTROABS 6.3  --   --   HGB 8.8* 9.0* 9.0*  HCT 27.7* 27.8* 28.0*  MCV 87.7 88.3 87.5  PLT 185 182 180   Cardiac Enzymes:    Recent Labs Lab 03/30/16  2138 03/31/16 0452  TROPONINI 0.51* 0.49*   BNP (last 3 results)  Recent Labs  02/28/16 2200  BNP >4500.0*    ProBNP (last 3 results) No results for input(s): PROBNP in the last 8760 hours.  CBG:  Recent Labs Lab 03/31/16 0749  GLUCAP 110*    Recent Results (from the past 240 hour(s))  MRSA PCR Screening     Status: None   Collection Time: 03/30/16  6:37 PM  Result Value Ref Range Status   MRSA by PCR NEGATIVE NEGATIVE Final    Comment:        The GeneXpert MRSA Assay (FDA approved for NASAL specimens only), is one component of a comprehensive MRSA colonization surveillance program. It is not intended to diagnose  MRSA infection nor to guide or monitor treatment for MRSA infections.      Studies: Dg Chest 1 View  03/30/2016  CLINICAL DATA:  Syncopal episode EXAM: CHEST 1 VIEW COMPARISON:  02/28/2016 FINDINGS: Cardiac shadow is stable. Right jugular dialysis catheter is again seen and stable. The lungs are well aerated bilaterally. No focal infiltrate or sizable effusion is seen. No significant edema is seen. No bony abnormality is noted. IMPRESSION: No active disease. Electronically Signed   By: Alcide Clever M.D.   On: 03/30/2016 16:43   Dg Thoracic Spine 2 View  03/30/2016  CLINICAL DATA:  Upper and mid back pain after falling today. EXAM: THORACIC SPINE 2 VIEWS COMPARISON:  02/28/2016 chest radiograph FINDINGS: Right-sided dialysis catheter with tip at cavoatrial junction or high right atrium. Lateral view images from approximately the bottom of C6 through the bottom of T11. Moderate spondylosis across these levels, without vertebral body height loss. Lower thoracic vertebral body height maintained on lumbar spine radiographs, dictated separately. IMPRESSION: Spondylosis, without acute finding in the thoracic spine. Electronically Signed   By: Jeronimo Greaves M.D.   On: 03/30/2016 16:44   Dg Lumbar Spine Complete  03/30/2016  CLINICAL DATA:  Patient status post fall. Mid and lower back pain. Initial encounter. EXAM: LUMBAR SPINE - COMPLETE 4+ VIEW COMPARISON:  None. FINDINGS: Mild leftward curvature of the lumbar spine. Preservation of the vertebral body and intervertebral disc space heights. SI joints are unremarkable. No evidence for acute lumbar spine fracture. Lower lumbar spine degenerative disc and facet disease. IMPRESSION: Degenerative disc disease.  No acute osseous abnormality. Electronically Signed   By: Annia Belt M.D.   On: 03/30/2016 16:43   Ct Head Wo Contrast  03/30/2016  CLINICAL DATA:  Initial encounter for MULTIPLE RECENT FALLS, TODAY PT. FELL BUT DENIES LOC, GENERALIZED WEAKNESS, HX  ESRD-DM-SYNCOPE-HTN-CAD, LW EXAM: CT HEAD WITHOUT CONTRAST CT CERVICAL SPINE WITHOUT CONTRAST TECHNIQUE: Multidetector CT imaging of the head and cervical spine was performed following the standard protocol without intravenous contrast. Multiplanar CT image reconstructions of the cervical spine were also generated. COMPARISON:  Head CT of 12/26/2015. Prior head and cervical spine CTs 12/25/2015. FINDINGS: CT HEAD FINDINGS Sinuses/Soft tissues: No significant soft tissue swelling. Mild ethmoid air cell mucosal thickening. No skull fracture. Clear mastoid air cells. Intracranial: Marked improvement to resolution of previously described subdural collection adjacent the left frontal lobe. Minimal prominence of extra-axial spaces remains, including image 17/series 201. Hypo attenuating. Bilateral vertebral artery atherosclerosis. Moderate low density in the periventricular white matter likely related to small vessel disease. Resolved right-sided extra-axial/subdural fluid collection. 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 No acute large vessel cortically based infarct. No new hemorrhage, intra-axial, or extra-axial fluid collection. No mass  lesion. CT CERVICAL SPINE FINDINGS Spinal visualization through the bottom of T2. Prevertebral soft tissues are within normal limits. Left worse than right carotid atherosclerosis. A right-sided central line is incompletely imaged. Small right pleural effusion is similar to 12/25/2015. No apical pneumothorax. Base interlobular septal thickening which suggests interstitial edema. Skull base intact.  Skull base intact. Trace C2-3 anterolisthesis is similar and likely degenerative. Maintenance of vertebral body height. Straightening of expected lordosis. Facets are well-aligned. Multilevel spondylosis, with loss of intervertebral disc height and endplate osteophyte formation. C1-2 articulation demonstrates only degenerative change on coronal reformats. IMPRESSION: 1. No acute  intracranial abnormality. Cerebral atrophy is small vessel ischemic change. 2. Resolved right and markedly improved to resolved left-sided subdural fluid collections. There is minimal residual prominence of the left extra-axial space adjacent the frontal lobe. 3. Cervical spondylosis, without acute fracture or subluxation. Straightening of expected cervical lordosis could be positional, due to muscular spasm, or ligamentous injury. 4. Similar small right pleural effusion. Suspect interstitial edema/congestive heart failure. Electronically Signed   By: Jeronimo GreavesKyle  Talbot M.D.   On: 03/30/2016 17:36   Ct Cervical Spine Wo Contrast  03/30/2016  CLINICAL DATA:  Initial encounter for MULTIPLE RECENT FALLS, TODAY PT. FELL BUT DENIES LOC, GENERALIZED WEAKNESS, HX ESRD-DM-SYNCOPE-HTN-CAD, LW EXAM: CT HEAD WITHOUT CONTRAST CT CERVICAL SPINE WITHOUT CONTRAST TECHNIQUE: Multidetector CT imaging of the head and cervical spine was performed following the standard protocol without intravenous contrast. Multiplanar CT image reconstructions of the cervical spine were also generated. COMPARISON:  Head CT of 12/26/2015. Prior head and cervical spine CTs 12/25/2015. FINDINGS: CT HEAD FINDINGS Sinuses/Soft tissues: No significant soft tissue swelling. Mild ethmoid air cell mucosal thickening. No skull fracture. Clear mastoid air cells. Intracranial: Marked improvement to resolution of previously described subdural collection adjacent the left frontal lobe. Minimal prominence of extra-axial spaces remains, including image 17/series 201. Hypo attenuating. Bilateral vertebral artery atherosclerosis. Moderate low density in the periventricular white matter likely related to small vessel disease. Resolved right-sided extra-axial/subdural fluid collection. 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 No acute large vessel cortically based infarct. No new hemorrhage, intra-axial, or extra-axial fluid collection. No mass lesion. CT CERVICAL  SPINE FINDINGS Spinal visualization through the bottom of T2. Prevertebral soft tissues are within normal limits. Left worse than right carotid atherosclerosis. A right-sided central line is incompletely imaged. Small right pleural effusion is similar to 12/25/2015. No apical pneumothorax. Base interlobular septal thickening which suggests interstitial edema. Skull base intact.  Skull base intact. Trace C2-3 anterolisthesis is similar and likely degenerative. Maintenance of vertebral body height. Straightening of expected lordosis. Facets are well-aligned. Multilevel spondylosis, with loss of intervertebral disc height and endplate osteophyte formation. C1-2 articulation demonstrates only degenerative change on coronal reformats. IMPRESSION: 1. No acute intracranial abnormality. Cerebral atrophy is small vessel ischemic change. 2. Resolved right and markedly improved to resolved left-sided subdural fluid collections. There is minimal residual prominence of the left extra-axial space adjacent the frontal lobe. 3. Cervical spondylosis, without acute fracture or subluxation. Straightening of expected cervical lordosis could be positional, due to muscular spasm, or ligamentous injury. 4. Similar small right pleural effusion. Suspect interstitial edema/congestive heart failure. Electronically Signed   By: Jeronimo GreavesKyle  Talbot M.D.   On: 03/30/2016 17:36    Scheduled Meds: . aspirin EC  81 mg Oral Daily  . citalopram  20 mg Oral Daily  . darbepoetin (ARANESP) injection - DIALYSIS  60 mcg Intravenous Q Mon-HD  . enoxaparin (LOVENOX) injection  30 mg Subcutaneous Q24H  .  feeding supplement (ENSURE ENLIVE)  237 mL Oral BID BM  . feeding supplement (NEPRO CARB STEADY)  237 mL Oral BID BM  . ferric gluconate (FERRLECIT/NULECIT) IV  125 mg Intravenous Q M,W,F-HD  . fludrocortisone  0.1 mg Oral Daily  . folic acid  1 mg Oral Daily  . metoprolol succinate  50 mg Oral Daily  . pantoprazole  20 mg Oral Daily  . potassium  chloride  40 mEq Oral BID  . sodium chloride flush  3 mL Intravenous Q12H    Continuous Infusions:    Time spent: 15 minutes  Hollice Espy  [facility] Pager [phone] . If 7PM-7AM, please contact night-coverage at [email or web address], password [facility] 03/31/2016, 1:59 PM

## 2016-03-31 NOTE — Procedures (Signed)
  I was present at this dialysis session, have reviewed the session itself and made  appropriate changes Jeffrey Walls Ethleen Lormand MD [facility] pager [phone]    cell [phone] 03/31/2016, 4:37 PM

## 2016-03-31 NOTE — Progress Notes (Signed)
Patient returned from hemodialysis.  Patient stated he wanted to wait until he finishes his dinner before taking his medications.  Daily medications moved to 19:30.

## 2016-03-31 NOTE — Progress Notes (Signed)
Initial Nutrition Assessment  DOCUMENTATION CODES:   Severe malnutrition in context of chronic illness, Underweight  INTERVENTION:  Discontinue Ensure/Nepro.  Provide Boost Breeze po BID, each supplement provides 250 kcal and 9 grams of protein.  Provide 30 ml Prostat po once daily, each supplement provides 100 kcal and 15 grams of protein.   Encourage adequate PO intake.   NUTRITION DIAGNOSIS:   Malnutrition related to chronic illness as evidenced by severe depletion of body fat, severe depletion of muscle mass.  GOAL:   Patient will meet greater than or equal to 90% of their needs  MONITOR:   PO intake, Supplement acceptance, Weight trends, Labs, I & O's  REASON FOR ASSESSMENT:   Malnutrition Screening Tool    ASSESSMENT:   75 y.o. male with a Past Medical history that includes diabetes type 2, hypertension, coronary artery disease, end-stage renal disease on Monday Wednesday Friday dialysis presents to the emergency Department chief complaint of syncope. Initial evaluation reveals elevated troponin, hypokalemia.   Pt reports having a good appetite currently and PTA with usual consumption of at least 3 meals a day with no other difficulties. Meal completion has been 80%. Pt reports usual body weight ~146 lbs. Pt unable to determine cause for weight loss. Pt with a reported 11.6% weight loss. Pt currently has Nepro shake and Ensure ordered however has been refusing them as she does not like the taste and texture. Pt agreeable to Boost Breeze instead. RD to additionally order Prostat to aid in protein needs. RD to modify orders.   Nutrition-Focused physical exam completed. Findings are severe fat depletion, severe muscle depletion, and no edema.   Labs and medications reviewed.   Diet Order:  Diet renal/carb modified with fluid restriction Diet-HS Snack?: Nothing; Room service appropriate?: Yes; Fluid consistency:: Thin  Skin:  Reviewed, no issues  Last BM:   4/16  Height:   Ht Readings from Last 1 Encounters:  03/05/16 5\' 9"  (1.753 m)    Weight:   Wt Readings from Last 1 Encounters:  03/31/16 121 lb 11.1 oz (55.2 kg)    Ideal Body Weight:  72.7 kg  BMI:  Body mass index is 17.96 kg/(m^2).  Estimated Nutritional Needs:   Kcal:  1850-2050  Protein:  85-95 grams  Fluid:  1.2 L/day  EDUCATION NEEDS:   No education needs identified at this time  Roslyn SmilingStephanie Nena Hampe, MS, RD, LDN Pager # [phone] After hours/ weekend pager # [phone]

## 2016-03-31 NOTE — Progress Notes (Addendum)
Pt with ESRD on TTS in [place] admitted to OBS status for syncope.  HD orders written for today in error.and currently on HD.  Next HD will be Thursday.  Full consult will be done if he transitions to inpatient status.  Bard HerbertMarty Bergman, PA-C  Pt seen, examined and agree w A/P as above.  Vinson Moselleob Aadith Raudenbush MD [facility] pager [phone]    cell [phone] 04/01/2016, 11:39 AM

## 2016-04-01 ENCOUNTER — Observation Stay (HOSPITAL_COMMUNITY): Payer: Medicare HMO

## 2016-04-01 DIAGNOSIS — R55 Syncope and collapse: Secondary | ICD-10-CM | POA: Diagnosis not present

## 2016-04-01 DIAGNOSIS — E8770 Fluid overload, unspecified: Secondary | ICD-10-CM | POA: Diagnosis not present

## 2016-04-01 DIAGNOSIS — I10 Essential (primary) hypertension: Secondary | ICD-10-CM | POA: Diagnosis not present

## 2016-04-01 DIAGNOSIS — N186 End stage renal disease: Secondary | ICD-10-CM | POA: Diagnosis not present

## 2016-04-01 LAB — BASIC METABOLIC PANEL
Anion gap: 8 (ref 5–15)
BUN: 11 mg/dL (ref 6–20)
CO2: 28 mmol/L (ref 22–32)
Calcium: 7.4 mg/dL — ABNORMAL LOW (ref 8.9–10.3)
Chloride: 99 mmol/L — ABNORMAL LOW (ref 101–111)
Creatinine, Ser: 2.01 mg/dL — ABNORMAL HIGH (ref 0.61–1.24)
GFR calc Af Amer: 36 mL/min — ABNORMAL LOW (ref >60)
GFR calc non Af Amer: 31 mL/min — ABNORMAL LOW (ref >60)
Glucose, Bld: 149 mg/dL — ABNORMAL HIGH (ref 65–99)
Potassium: 4.4 mmol/L (ref 3.5–5.1)
Sodium: 135 mmol/L (ref 135–145)

## 2016-04-01 LAB — URINE CULTURE

## 2016-04-01 LAB — GLUCOSE, CAPILLARY
GLUCOSE-CAPILLARY: 135 mg/dL — AB (ref 65–99)
Glucose-Capillary: 113 mg/dL — ABNORMAL HIGH (ref 65–99)
Glucose-Capillary: 90 mg/dL (ref 65–99)
Glucose-Capillary: 102 mg/dL — ABNORMAL HIGH (ref 65–99)

## 2016-04-01 LAB — HEMOGLOBIN A1C
HEMOGLOBIN A1C: 5.5 % (ref 4.8–5.6)
Mean Plasma Glucose: 111 mg/dL

## 2016-04-01 MED ORDER — FUROSEMIDE 10 MG/ML IJ SOLN
20.0000 mg | Freq: Two times a day (BID) | INTRAMUSCULAR | Status: DC
  Administered 2016-04-01 – 2016-04-02 (×2): 20 mg via INTRAVENOUS
  Filled 2016-04-01 (×2): qty 2

## 2016-04-01 MED ORDER — OXYCODONE-ACETAMINOPHEN 5-325 MG PO TABS
1.0000 | ORAL_TABLET | ORAL | Status: DC | PRN
  Administered 2016-04-01 – 2016-04-09 (×19): 1 via ORAL
  Filled 2016-04-01 (×18): qty 1

## 2016-04-01 MED ORDER — OXYCODONE HCL 5 MG PO TABS
5.0000 mg | ORAL_TABLET | Freq: Four times a day (QID) | ORAL | Status: DC | PRN
  Administered 2016-04-04 – 2016-04-09 (×8): 5 mg via ORAL
  Filled 2016-04-01 (×8): qty 1

## 2016-04-01 NOTE — Evaluation (Signed)
Physical Therapy Evaluation Patient Details Name: Jeffrey Walls MRN: [id number] DOB: [birth year] Today's Date: 04/01/2016   History of Present Illness  75 year old male with past mental history of diabetes, hypertension and end-stage renal disease who was recently discharged from skilled nursing facility for rehabilitation and had been having increased weakness as well as came in following a syncopal episode. This has been the second one since patient finished inpatient rehabilitation.  Clinical Impression   Pt admitted with above diagnosis. Pt currently with functional limitations due to the deficits listed below (see PT Problem List).   Jeffrey Walls is clearly not managing well at home; he must be completely independent to go home including driving self to and from HD; His current living situation seems unsustainable; Perhaps it is time to look into ALF after SFN stay;   Pt will benefit from skilled PT to increase their independence and safety with mobility to allow discharge to the venue listed below.       Follow Up Recommendations SNF    Equipment Recommendations  3in1 (PT)    Recommendations for Other Services OT consult     Precautions / Restrictions Precautions Precautions: Fall      Mobility  Bed Mobility Overal bed mobility: Needs Assistance Bed Mobility: Supine to Sit     Supine to sit: Min assist     General bed mobility comments: min handheld assist to pull to sit  Transfers Overall transfer level: Needs assistance Equipment used: Rolling walker (2 wheeled) Transfers: Sit to/from Stand Sit to Stand: Mod assist         General transfer comment: min assist to power up  Ambulation/Gait Ambulation/Gait assistance: Min guard Ambulation Distance (Feet): 3 Feet (sidesteps to [facility]) Assistive device: Rolling walker (2 wheeled)       General Gait Details: Cues to self-monitor for activity tolerance  Stairs            Wheelchair Mobility     Modified Rankin (Stroke Patients Only)       Balance Overall balance assessment: Needs assistance;History of Falls   Sitting balance-Leahy Scale: Fair     Standing balance support: Bilateral upper extremity supported Standing balance-Leahy Scale: Poor                               Pertinent Vitals/Pain Pain Assessment: Faces Faces Pain Scale: Hurts little more Pain Location: Back pain (chronic) Pain Descriptors / Indicators: Aching Pain Intervention(s): Monitored during session    Home Living Family/patient expects to be discharged to:: Private residence Living Arrangements: Alone Available Help at Discharge: Personal care attendant;Available PRN/intermittently (Provides assist with medication management only) Type of Home: House Home Access: Stairs to enter Entrance Stairs-Rails: None Entrance Stairs-Number of Steps: 2 Home Layout: One level Home Equipment: Walker - 2 wheels;Cane - single point;Shower seat      Prior Function Level of Independence: Independent with assistive device(s) (prior to last admission)         Comments: Uses cane for ambulation.  Drives self to HD.     Hand Dominance   Dominant Hand: Right    Extremity/Trunk Assessment   Upper Extremity Assessment: Generalized weakness           Lower Extremity Assessment: Generalized weakness         Communication   Communication: No difficulties  Cognition Arousal/Alertness: Awake/alert Behavior During Therapy: WFL for tasks assessed/performed;Flat affect Overall Cognitive Status: Within Functional Limits for tasks  assessed                      General Comments      Exercises        Assessment/Plan    PT Assessment Patient needs continued PT services  PT Diagnosis Generalized weakness;Other (comment) (Decr activity tolerance)   PT Problem List Decreased strength;Decreased activity tolerance;Decreased balance;Decreased mobility;Decreased  coordination;Decreased cognition;Decreased knowledge of use of DME;Decreased safety awareness;Decreased knowledge of precautions;Cardiopulmonary status limiting activity  PT Treatment Interventions DME instruction;Gait training;Stair training;Functional mobility training;Therapeutic activities;Therapeutic exercise;Patient/family education;Balance training   PT Goals (Current goals can be found in the Care Plan section) Acute Rehab PT Goals Patient Stated Goal: Did not state PT Goal Formulation: With patient Time For Goal Achievement: 04/15/16 Potential to Achieve Goals: Good    Frequency Min 3X/week   Barriers to discharge Decreased caregiver support Lives alone; was home 3 days from inpatient rehab before fall and this admission    Co-evaluation               End of Session Equipment Utilized During Treatment: Gait belt Activity Tolerance: Other (comment) (limited by dizziness with upright activity ) Patient left: in bed;with call bell/phone within reach (bed in semi-chair position) Nurse Communication: Mobility status    Functional Assessment Tool Used: Clinical Judgement Functional Limitation: Mobility: Walking and moving around Mobility: Walking and Moving Around Current Status ([id number](G8978): At least 40 percent but less than 60 percent impaired, limited or restricted Mobility: Walking and Moving Around Goal Status [phone](G8979): At least 1 percent but less than 20 percent impaired, limited or restricted    Time: 1439-1500 PT Time Calculation (min) (ACUTE ONLY): 21 min   Charges:   PT Evaluation $PT Eval Moderate Complexity: 1 Procedure     PT G Codes:   PT G-Codes **NOT FOR INPATIENT CLASS** Functional Assessment Tool Used: Clinical Judgement Functional Limitation: Mobility: Walking and moving around Mobility: Walking and Moving Around Current Status ([id number](G8978): At least 40 percent but less than 60 percent impaired, limited or restricted Mobility: Walking and Moving Around Goal  Status [phone](G8979): At least 1 percent but less than 20 percent impaired, limited or restricted    Van ClinesGarrigan, Vickki Igou Efthemios Raphtis Md Pcamff 04/01/2016, 4:22 PM  Van ClinesHolly Terralyn Matsumura, PT  Acute Rehabilitation Services Pager [phone] Office [phone]

## 2016-04-01 NOTE — Care Management Obs Status (Signed)
MEDICARE OBSERVATION STATUS NOTIFICATION   Patient Details  Name: Jeffrey Walls MRN: [id number] Date of Birth: Mar 09, 1941   Medicare Observation Status Notification Given:  Yes    Joan Avetisyan, Annamarie MajorCheryl U, RN 04/01/2016, 2:16 PM

## 2016-04-01 NOTE — Progress Notes (Addendum)
Remains in OBS status.  Main c/o is SOB and not feeling good and falling since he started HD Dec 2017.  No abd pain, no cough.  Exam bibasilar rales, CXR R haziness, neck CT shows sig R pleural effusion.  Will get chest CT as patient may be wet but not readily apparent by CXR.    Vinson Moselleob Taron Mondor MD [facility] pager [phone]    cell [phone] 04/01/2016, 11:41 AM

## 2016-04-01 NOTE — Progress Notes (Signed)
PROGRESS NOTE  Jeffrey Walls EXB:[id number] DOB: March 11, 1941 DOA: 03/30/2016 PCP: Theressa Stamps, MD  HPI/Recap of past 91 hours:  75 year old male with past mental history of diabetes, hypertension and end-stage renal disease who was recently discharged from skilled nursing facility for rehabilitation and had been having increased weakness as well as came in following a syncopal episode. This has been the second one since patient finished inpatient rehabilitation. The emergency room, labs were unrevealing except for mildly elevated troponin, although this is in the setting of end-stage renal disease. Patient has had no symptoms of chest pain.  Patient doing okay. His description of lightheadedness and weakness seems related to dyspnea on exertion. Less likely orthostatic. Echocardiogram done, however results are still pending. Given description, CT of chest done noting bilateral pleural effusions and evidence of persistent volume overload, despite continued dialysis. Patient today complaining of some dyspnea on exertion when laying flat on his back  Assessment/Plan: Principal Problem:   Syncope: Unclear etiology. Echocardiogram and orthostatics ordered. Patient states that when it happens, it is after exertion and he feels like he is somewhat short of breath. Echo done, results pending. After reviewing CT results,We'll discuss with nephrology and perhaps needs more volume taken off? Still making urine so Lasix added Active Problems:   ESRD on hemodialysis [facility]): Had dialysis on 4/17. Nephrology following   Anemia of renal disease: Management as per nephrology   Frequent falls   Essential hypertension: Blood pressure stable, slightly elevated after holding his ACE inhibitor.   Diabetes mellitus with diabetic nephropathy, with long-term current use of insulin (HCC): Sliding scale. A1c low at 5.5. Concerned about episodes of hypoglycemia.   Elevated troponin: Stable, likely secondary to renal  disease Volume overload: May be cause of syncope. As above. Lasix and management with dialysis  Code Status: Full code  Family Communication: Left message for family  Disposition Plan: Potential discharge in next 24-48 hours    Consultants:  Nephrology  Procedures:  Dialysis Monday Wednesday Friday   Echocardiogram done 4/18: Results pending  Antibiotics:  None   Objective: BP 147/66 mmHg  Pulse 71  Temp(Src) 100.3 F (37.9 C) (Oral)  Resp 16  Wt 55.2 kg (121 lb 11.1 oz)  SpO2 95%  Intake/Output Summary (Last 24 hours) at 04/01/16 1936 Last data filed at 04/01/16 1730  Gross per 24 hour  Intake    483 ml  Output    275 ml  Net    208 ml   Filed Weights   03/30/16 1246 03/31/16 1355 03/31/16 1815  Weight: 58.514 kg (129 lb) 55.2 kg (121 lb 11.1 oz) 55.2 kg (121 lb 11.1 oz)    Exam:   General:  Alert and oriented 3   Cardiovascular: Regular rate and rhythm, S1-S2   Respiratory: Clear to auscultation bilaterally   Abdomen: Soft, nontender, nondistended, positive bowel sounds  Musculoskeletal: No clubbing or cyanosis, trace edema \  Data Reviewed: Basic Metabolic Panel:  Recent Labs Lab 03/30/16 1447 03/30/16 1649 03/31/16 0452 04/01/16 0559  NA 137  --  138 135  K 3.1*  --  2.9* 4.4  CL 97*  --  97* 99*  CO2 28  --  31 28  GLUCOSE 105*  --  115* 149*  BUN 12  --  17 11  CREATININE 3.24* 3.42* 3.72* 2.01*  CALCIUM 8.1*  --  8.0* 7.4*  MG  --  1.7  --   --   PHOS  --  3.1  --   --  Liver Function Tests:  Recent Labs Lab 03/30/16 1447  AST 26  ALT 19  ALKPHOS 102  BILITOT 1.2  PROT 4.3*  ALBUMIN 2.3*   No results for input(s): LIPASE, AMYLASE in the last 168 hours. No results for input(s): AMMONIA in the last 168 hours. CBC:  Recent Labs Lab 03/30/16 1447 03/30/16 1649 03/31/16 0452  WBC 8.1 7.6 6.4  NEUTROABS 6.3  --   --   HGB 8.8* 9.0* 9.0*  HCT 27.7* 27.8* 28.0*  MCV 87.7 88.3 87.5  PLT 185 182 180   Cardiac  Enzymes:    Recent Labs Lab 03/30/16 2138 03/31/16 0452  TROPONINI 0.51* 0.49*   BNP (last 3 results)  Recent Labs  02/28/16 2200  BNP >4500.0*    ProBNP (last 3 results) No results for input(s): PROBNP in the last 8760 hours.  CBG:  Recent Labs Lab 03/31/16 1857 03/31/16 2130 04/01/16 0758 04/01/16 1214 04/01/16 1710  GLUCAP 100* 149* 113* 135* 90    Recent Results (from the past 240 hour(s))  MRSA PCR Screening     Status: None   Collection Time: 03/30/16  6:37 PM  Result Value Ref Range Status   MRSA by PCR NEGATIVE NEGATIVE Final    Comment:        The GeneXpert MRSA Assay (FDA approved for NASAL specimens only), is one component of a comprehensive MRSA colonization surveillance program. It is not intended to diagnose MRSA infection nor to guide or monitor treatment for MRSA infections.   Urine culture     Status: None   Collection Time: 03/31/16  8:58 AM  Result Value Ref Range Status   Specimen Description URINE, CLEAN CATCH  Final   Special Requests NONE  Final   Culture MULTIPLE SPECIES PRESENT, SUGGEST RECOLLECTION  Final   Report Status 04/01/2016 FINAL  Final     Studies: Ct Chest Wo Contrast  04/01/2016  CLINICAL DATA:  Shortness of breath today in patient admitted to the hospital with a chief complaint of syncope on 03/30/2016. The patient denies cough. End-stage renal disease. Initial encounter. EXAM: CT CHEST WITHOUT CONTRAST TECHNIQUE: Multidetector CT imaging of the chest was performed following the standard protocol without IV contrast. COMPARISON:  PA and lateral chest 02/28/2016. FINDINGS: The patient has moderate bilateral pleural effusions, greater on the right. Mild cardiomegaly is seen. Small pericardial effusion is identified. Extensive calcific coronary artery disease is present. Dialysis catheter from a right IJ approach is noted. There is no axillary, hilar or mediastinal lymphadenopathy. The lungs demonstrate mild compressive  atelectasis secondary to the patient's pleural effusions, slightly greater on the right. There is also some interlobular septal thickening in the upper lobes which is worse on the left. The lungs are otherwise unremarkable. Visualized upper abdomen demonstrates extensive atherosclerosis. No focal abnormality is seen. No focal bony abnormality is identified. IMPRESSION: Moderate bilateral pleural effusions, larger on the right. Interlobular septal thickening in the upper lobes bilaterally is compatible with mild edema. Small pericardial effusion. Extensive calcific coronary artery disease. Electronically Signed   By: Drusilla Kanner M.D.   On: 04/01/2016 13:15    Scheduled Meds: . aspirin EC  81 mg Oral Daily  . citalopram  20 mg Oral Daily  . darbepoetin (ARANESP) injection - DIALYSIS  60 mcg Intravenous Q Mon-HD  . enoxaparin (LOVENOX) injection  30 mg Subcutaneous Q24H  . feeding supplement  1 Container Oral BID BM  . feeding supplement (PRO-STAT SUGAR FREE 64)  30 mL Oral Q2000  .  ferric gluconate (FERRLECIT/NULECIT) IV  125 mg Intravenous Q M,W,F-HD  . fludrocortisone  0.1 mg Oral Daily  . folic acid  1 mg Oral Daily  . furosemide  20 mg Intravenous Q12H  . insulin aspart  0-9 Units Subcutaneous TID WC  . metoprolol succinate  50 mg Oral Daily  . pantoprazole  20 mg Oral Daily  . potassium chloride  40 mEq Oral BID  . sodium chloride flush  3 mL Intravenous Q12H    Continuous Infusions:    Time spent: 25 minutes  Hollice EspyKRISHNAN,SENDIL K  [facility] Pager [phone] . If 7PM-7AM, please contact night-coverage at [email or web address], password [email or web address] 04/01/2016, 7:36 PM

## 2016-04-02 ENCOUNTER — Observation Stay (HOSPITAL_BASED_OUTPATIENT_CLINIC_OR_DEPARTMENT_OTHER): Payer: Medicare HMO

## 2016-04-02 DIAGNOSIS — I10 Essential (primary) hypertension: Secondary | ICD-10-CM | POA: Diagnosis not present

## 2016-04-02 DIAGNOSIS — Z79899 Other long term (current) drug therapy: Secondary | ICD-10-CM | POA: Diagnosis not present

## 2016-04-02 DIAGNOSIS — D649 Anemia, unspecified: Secondary | ICD-10-CM | POA: Diagnosis present

## 2016-04-02 DIAGNOSIS — M4646 Discitis, unspecified, lumbar region: Secondary | ICD-10-CM | POA: Diagnosis present

## 2016-04-02 DIAGNOSIS — I4581 Long QT syndrome: Secondary | ICD-10-CM | POA: Diagnosis present

## 2016-04-02 DIAGNOSIS — R778 Other specified abnormalities of plasma proteins: Secondary | ICD-10-CM | POA: Diagnosis present

## 2016-04-02 DIAGNOSIS — I5041 Acute combined systolic (congestive) and diastolic (congestive) heart failure: Secondary | ICD-10-CM | POA: Diagnosis present

## 2016-04-02 DIAGNOSIS — Z7982 Long term (current) use of aspirin: Secondary | ICD-10-CM | POA: Diagnosis not present

## 2016-04-02 DIAGNOSIS — I2510 Atherosclerotic heart disease of native coronary artery without angina pectoris: Secondary | ICD-10-CM | POA: Diagnosis present

## 2016-04-02 DIAGNOSIS — B957 Other staphylococcus as the cause of diseases classified elsewhere: Secondary | ICD-10-CM | POA: Diagnosis present

## 2016-04-02 DIAGNOSIS — E1121 Type 2 diabetes mellitus with diabetic nephropathy: Secondary | ICD-10-CM | POA: Diagnosis present

## 2016-04-02 DIAGNOSIS — T827XXA Infection and inflammatory reaction due to other cardiac and vascular devices, implants and grafts, initial encounter: Secondary | ICD-10-CM | POA: Diagnosis present

## 2016-04-02 DIAGNOSIS — D631 Anemia in chronic kidney disease: Secondary | ICD-10-CM | POA: Diagnosis present

## 2016-04-02 DIAGNOSIS — R7989 Other specified abnormal findings of blood chemistry: Secondary | ICD-10-CM | POA: Diagnosis not present

## 2016-04-02 DIAGNOSIS — E875 Hyperkalemia: Secondary | ICD-10-CM | POA: Diagnosis present

## 2016-04-02 DIAGNOSIS — R609 Edema, unspecified: Secondary | ICD-10-CM | POA: Diagnosis not present

## 2016-04-02 DIAGNOSIS — N186 End stage renal disease: Secondary | ICD-10-CM | POA: Diagnosis present

## 2016-04-02 DIAGNOSIS — Z681 Body mass index (BMI) 19 or less, adult: Secondary | ICD-10-CM | POA: Diagnosis not present

## 2016-04-02 DIAGNOSIS — R55 Syncope and collapse: Secondary | ICD-10-CM | POA: Diagnosis present

## 2016-04-02 DIAGNOSIS — I272 Other secondary pulmonary hypertension: Secondary | ICD-10-CM | POA: Diagnosis present

## 2016-04-02 DIAGNOSIS — I330 Acute and subacute infective endocarditis: Secondary | ICD-10-CM | POA: Diagnosis present

## 2016-04-02 DIAGNOSIS — G8929 Other chronic pain: Secondary | ICD-10-CM | POA: Diagnosis present

## 2016-04-02 DIAGNOSIS — Z794 Long term (current) use of insulin: Secondary | ICD-10-CM | POA: Diagnosis not present

## 2016-04-02 DIAGNOSIS — E876 Hypokalemia: Secondary | ICD-10-CM | POA: Diagnosis present

## 2016-04-02 DIAGNOSIS — I132 Hypertensive heart and chronic kidney disease with heart failure and with stage 5 chronic kidney disease, or end stage renal disease: Secondary | ICD-10-CM | POA: Diagnosis present

## 2016-04-02 DIAGNOSIS — R296 Repeated falls: Secondary | ICD-10-CM | POA: Diagnosis present

## 2016-04-02 DIAGNOSIS — N185 Chronic kidney disease, stage 5: Secondary | ICD-10-CM | POA: Diagnosis not present

## 2016-04-02 DIAGNOSIS — R0902 Hypoxemia: Secondary | ICD-10-CM | POA: Diagnosis present

## 2016-04-02 DIAGNOSIS — E43 Unspecified severe protein-calorie malnutrition: Secondary | ICD-10-CM | POA: Diagnosis present

## 2016-04-02 DIAGNOSIS — E1122 Type 2 diabetes mellitus with diabetic chronic kidney disease: Secondary | ICD-10-CM | POA: Diagnosis present

## 2016-04-02 DIAGNOSIS — I351 Nonrheumatic aortic (valve) insufficiency: Secondary | ICD-10-CM | POA: Diagnosis present

## 2016-04-02 DIAGNOSIS — Y838 Other surgical procedures as the cause of abnormal reaction of the patient, or of later complication, without mention of misadventure at the time of the procedure: Secondary | ICD-10-CM | POA: Diagnosis not present

## 2016-04-02 DIAGNOSIS — R7881 Bacteremia: Secondary | ICD-10-CM | POA: Diagnosis present

## 2016-04-02 DIAGNOSIS — Z880 Allergy status to penicillin: Secondary | ICD-10-CM | POA: Diagnosis not present

## 2016-04-02 DIAGNOSIS — Z888 Allergy status to other drugs, medicaments and biological substances status: Secondary | ICD-10-CM | POA: Diagnosis not present

## 2016-04-02 DIAGNOSIS — D696 Thrombocytopenia, unspecified: Secondary | ICD-10-CM | POA: Diagnosis present

## 2016-04-02 DIAGNOSIS — E0821 Diabetes mellitus due to underlying condition with diabetic nephropathy: Secondary | ICD-10-CM | POA: Diagnosis not present

## 2016-04-02 DIAGNOSIS — M899 Disorder of bone, unspecified: Secondary | ICD-10-CM | POA: Diagnosis present

## 2016-04-02 DIAGNOSIS — Z992 Dependence on renal dialysis: Secondary | ICD-10-CM | POA: Diagnosis not present

## 2016-04-02 DIAGNOSIS — I5043 Acute on chronic combined systolic (congestive) and diastolic (congestive) heart failure: Secondary | ICD-10-CM | POA: Diagnosis present

## 2016-04-02 LAB — ECHOCARDIOGRAM COMPLETE: WEIGHTICAEL: 1936.52 [oz_av]

## 2016-04-02 LAB — BASIC METABOLIC PANEL
ANION GAP: 6 (ref 5–15)
BUN: 19 mg/dL (ref 6–20)
CALCIUM: 8.1 mg/dL — AB (ref 8.9–10.3)
CO2: 28 mmol/L (ref 22–32)
Chloride: 101 mmol/L (ref 101–111)
Creatinine, Ser: 3.14 mg/dL — ABNORMAL HIGH (ref 0.61–1.24)
GFR calc Af Amer: 21 mL/min — ABNORMAL LOW (ref >60)
GFR, EST NON AFRICAN AMERICAN: 18 mL/min — AB (ref >60)
GLUCOSE: 134 mg/dL — AB (ref 65–99)
POTASSIUM: 5.9 mmol/L — AB (ref 3.5–5.1)
SODIUM: 135 mmol/L (ref 135–145)

## 2016-04-02 LAB — GLUCOSE, CAPILLARY
GLUCOSE-CAPILLARY: 86 mg/dL (ref 65–99)
GLUCOSE-CAPILLARY: 131 mg/dL — AB (ref 65–99)
GLUCOSE-CAPILLARY: 107 mg/dL — AB (ref 65–99)
GLUCOSE-CAPILLARY: 137 mg/dL — AB (ref 65–99)
GLUCOSE-CAPILLARY: 169 mg/dL — AB (ref 65–99)

## 2016-04-02 MED ORDER — PENTAFLUOROPROP-TETRAFLUOROETH EX AERO: 1.0000 "application " | INHALATION_SPRAY | CUTANEOUS | Status: DC | PRN

## 2016-04-02 MED ORDER — SODIUM CHLORIDE 0.9 % IV SOLN: 100.0000 mL | INTRAVENOUS | Status: DC | PRN

## 2016-04-02 MED ORDER — ALTEPLASE 2 MG IJ SOLR: 2.0000 mg | Freq: Once | INTRAMUSCULAR | Status: DC | PRN

## 2016-04-02 MED ORDER — LISINOPRIL 20 MG PO TABS
20.0000 mg | ORAL_TABLET | Freq: Every day | ORAL | Status: DC
  Administered 2016-04-02 – 2016-04-03 (×2): 20 mg via ORAL
  Filled 2016-04-02 (×2): qty 1

## 2016-04-02 MED ORDER — VANCOMYCIN HCL IN DEXTROSE 1-5 GM/200ML-% IV SOLN
1000.0000 mg | Freq: Once | INTRAVENOUS | Status: AC
  Administered 2016-04-02: 1000 mg via INTRAVENOUS

## 2016-04-02 MED ORDER — LIDOCAINE HCL (PF) 1 % IJ SOLN: 5.0000 mL | INTRAMUSCULAR | Status: DC | PRN

## 2016-04-02 MED ORDER — CEFTAZIDIME 2 G IJ SOLR
2.0000 g | INTRAMUSCULAR | Status: DC
  Administered 2016-04-02: 2 g via INTRAVENOUS
  Filled 2016-04-02 (×2): qty 2

## 2016-04-02 MED ORDER — HEPARIN SODIUM (PORCINE) 1000 UNIT/ML DIALYSIS: 2000.0000 [IU] | INTRAMUSCULAR | Status: DC | PRN

## 2016-04-02 MED ORDER — HEPARIN SODIUM (PORCINE) 1000 UNIT/ML DIALYSIS: 1000.0000 [IU] | INTRAMUSCULAR | Status: DC | PRN

## 2016-04-02 MED ORDER — LIDOCAINE-PRILOCAINE 2.5-2.5 % EX CREA: 1.0000 "application " | TOPICAL_CREAM | CUTANEOUS | Status: DC | PRN

## 2016-04-02 MED ORDER — OXYCODONE-ACETAMINOPHEN 5-325 MG PO TABS
ORAL_TABLET | ORAL | Status: AC
  Filled 2016-04-02: qty 1

## 2016-04-02 MED ORDER — VANCOMYCIN HCL 500 MG IV SOLR
500.0000 mg | INTRAVENOUS | Status: DC
  Administered 2016-04-04: 500 mg via INTRAVENOUS
  Filled 2016-04-02 (×4): qty 500

## 2016-04-02 MED ORDER — VANCOMYCIN HCL IN DEXTROSE 1-5 GM/200ML-% IV SOLN
INTRAVENOUS | Status: AC
Start: 2016-04-02 — End: 2016-04-02
  Administered 2016-04-02: 1000 mg via INTRAVENOUS
  Filled 2016-04-02: qty 200

## 2016-04-02 NOTE — Progress Notes (Signed)
Attempted to see pt today. Pt refused stating he is NOT getting up right now and will not be getting up any time soon. Explained to pt the benefit and need to get up and do therapy and pt stated "not today."  Will return as schedule allows. Tory EmeraldHolly Elia Keenum, [place]/L [phone]

## 2016-04-02 NOTE — Consult Note (Signed)
Renal Service Consult Note  [facility]  Jeffrey Walls 04/02/2016 Jeffrey Walls Requesting Physician:  Dr [name]  Reason for Consult:  ESRD pt w syncope HPI: The patient is a 75 y.o. year-old with hx of ESRD , HTN, DM, CAD came to ED on 4/16 w syncopal episode.  Trop was slightly up, no CP. Admitted OBS status.  Also reported SOB and DOE.  ECHO ok. CXR hazy R side but read by radiology as normal.  CT chest done yest showing large R effusion and smaller L effusion and IS edema in fissures c/w CHF.  Today pt is full admit, asked to see for HD.    Patient having chills on HD.  No CP, cough or abd pain.  No diarrhea.     ROS  denies CP  no joint pain   no HA  no blurry vision  no rash  no diarrhea  no nausea/ vomiting  no dysuria  no difficulty voiding  no change in urine color    Past Medical History  Past Medical History  Diagnosis Date  . Renal disorder   . Hypertension   . Diabetes mellitus without complication (HCC)   . Coronary artery disease   . Shortness of breath dyspnea   . ANCA-associated vasculitis (HCC)   . Pneumocystis jiroveci pneumonia Jeffrey Walls(HCC)    Past Surgical History  Past Surgical History  Procedure Laterality Date  . Av fistula placement Left 03/04/2016    Procedure: ARTERIOVENOUS (AV) FISTULA CREATION LEFT RADIOCEPHALIC;  Surgeon: [name], MD;  [facility];  Service: Vascular;  Laterality: Left;   Family History History reviewed. No pertinent family history. Social History  reports that he has never smoked. He does not have any smokeless tobacco history on file. He reports that he does not drink alcohol or use illicit drugs. Allergies  Allergies  Allergen Reactions  . Ampicillin Rash    Ankle swelling  . Benadryl [Diphenhydramine Hcl] Itching    Rash   Home medications Prior to Admission medications   Medication Sig Start Date End Date Taking? Authorizing Provider  aspirin EC 81 MG tablet Take 81 mg by mouth  daily.   Yes Historical Provider, MD  citalopram (CELEXA) 20 MG tablet Take 20 mg by mouth daily.   Yes Historical Provider, MD  FLUDROCORTISONE ACETATE PO Take 0.1 mg by mouth daily.   Yes Historical Provider, MD  folic acid (FOLVITE) 1 MG tablet Take 1 mg by mouth daily. 08/30/15  Yes Historical Provider, MD  Insulin Glargine (LANTUS SOLOSTAR) 100 UNIT/ML Solostar Pen Inject 5 Units into the skin every morning.   Yes Historical Provider, MD  lisinopril (PRINIVIL,ZESTRIL) 20 MG tablet Take 1 tablet (20 mg total) by mouth daily. 03/06/16  Yes Jeffrey OgleIskra M Myers, MD  metoprolol succinate (TOPROL-XL) 50 MG 24 hr tablet Take 50 mg by mouth daily. 02/04/16  Yes Historical Provider, MD  oxyCODONE-acetaminophen (PERCOCET/ROXICET) 5-325 MG tablet Take 1-2 tablets by mouth every 4 (four) hours as needed for severe pain. 03/06/16  Yes Jeffrey OgleIskra M Myers, MD  pantoprazole (PROTONIX) 20 MG tablet Take 20 mg by mouth daily. 08/18/15 08/17/16 Yes Historical Provider, MD  vitamin B-12 (CYANOCOBALAMIN) 1000 MCG tablet Place 1,000 mcg under the tongue daily.    Yes Historical Provider, MD  zolpidem (AMBIEN) 5 MG tablet Take 1 tablet (5 mg total) by mouth at bedtime as needed. Sleep 03/06/16  Yes Jeffrey OgleIskra M Myers, MD   Liver Function Tests  Recent Labs Lab 03/30/16 1447  AST 26  ALT 19  ALKPHOS 102  BILITOT 1.2  PROT 4.3*  ALBUMIN 2.3*   No results for input(s): LIPASE, AMYLASE in the last 168 hours. CBC  Recent Labs Lab 03/30/16 1447 03/30/16 1649 03/31/16 0452  WBC 8.1 7.6 6.4  NEUTROABS 6.3  --   --   HGB 8.8* 9.0* 9.0*  HCT 27.7* 27.8* 28.0*  MCV 87.7 88.3 87.5  PLT 185 182 180   Basic Metabolic Panel  Recent Labs Lab 03/30/16 1447 03/30/16 1649 03/31/16 0452 04/01/16 0559 04/02/16 1350  NA 137  --  138 135 135  K 3.1*  --  2.9* 4.4 5.9*  CL 97*  --  97* 99* 101  CO2 28  --  GLUCOSE 105*  --  115* 149* 134*  BUN 12  --  CREATININE 3.24* 3.42* 3.72* 2.01* 3.14*  CALCIUM 8.1*   --  8.0* 7.4* 8.1*  PHOS  --  3.1  --   --   --     Filed Vitals:   04/02/16 1357 04/02/16 1400 04/02/16 1430 04/02/16 1500  BP: 127/73 115/62 133/64 152/90  Pulse: 62 59 66 87  Temp: 98.1 F (36.7 C)     TempSrc: Oral     Resp: Weight: 56.8 kg (125 lb 3.5 oz)     SpO2:       Exam Awake, having chills, alert elderly man, somewhat frail No rash, cyanosis or gangrene Sclera anicteric, throat clear  No jvd or bruits Chest clear bilat R IJ HD cath clean exit site RRR no MRG Abd soft ntnd no mass or ascites +bs GU normal male MS no joint effusions or deformity Ext no LE edema / no open wounds or ulcers Neuro is alert, Ox 3 , nf     Dialysis: MWF [facility]  56kg  2/2.25 bath  Hep none   LUA AVF (maturing)/ R IJ cath Mircera 30 ug every 2, last 4/12 Hep lock for cath tfs 27%, ferr 2787  Hb 10.5  pth 115  Ca/P ok  Assessment: 1. Syncopal episode 2. DOE/ pulm edema/ pleural effusions - try to improve w HD, if not better will need thoracentesis 3. ESRD HD today 4. Chills/ fever - temp 102 last night, have ordered blood cx's and IV abx 5. Hx ANCA vasculitis   Plan - as above  Jeffrey Moselle MD [facility] pager [phone]    cell [phone] 04/02/2016, 3:51 PM

## 2016-04-02 NOTE — [place] FL2 (Signed)
[place] MEDICAID FL2 LEVEL OF CARE SCREENING TOOL     IDENTIFICATION  Patient Name: Jeffrey Walls Birthdate: 1941-08-10 Sex: male Admission Date (Current Location): 03/30/2016  [facility] [place]:  [employer]   Facility and Address:  [facility] [place], [address], [place], [place] [address]      Provider Number: [id number]  Attending Physician Name and Address:  Hollice EspySendil K Krishnan, MD  Relative Name and Phone Number:  Kathi Simpersara Lowery -daughter. Phone number [phone]    Current Level of Care: Hospital Recommended Level of Care: Skilled Nursing Facility Prior Approval Number:    Date Approved/Denied:   PASRR Number: [id number]  (Eff. 03/05/16))  Discharge Plan: SNF    Current Diagnoses: Patient Active Problem List   Diagnosis Date Noted  . Syncope 03/30/2016  . Elevated troponin 03/30/2016  . End stage renal disease (HCC)   . Protein-calorie malnutrition, severe 03/05/2016  . Essential hypertension 03/04/2016  . Pancytopenia (HCC) 03/04/2016  . Diabetes mellitus with diabetic nephropathy, with long-term current use of insulin (HCC) 03/04/2016  . Frequent falls 02/29/2016  . Volume overload 02/28/2016  . ESRD on hemodialysis (HCC) 12/25/2015  . Anemia of renal disease 12/25/2015    Orientation RESPIRATION BLADDER Height & Weight     Self, Time, Situation, Place  Normal, O2 (2 Liters oxygen PRN ) Continent Weight: 121 lb 0.5 oz (54.9 kg) Height:     BEHAVIORAL SYMPTOMS/MOOD NEUROLOGICAL BOWEL NUTRITION STATUS      Continent Diet (Renal carb modified with fluid restriction)  AMBULATORY STATUS COMMUNICATION OF NEEDS Skin   Limited Assist Verbally Other (Comment) (Abrasion bilateral leg; Ecchymosis bilateral arm and hand; Skin tear left arm)                       Personal Care Assistance Level of Assistance  Bathing, Feeding, Dressing Bathing Assistance: Maximum assistance Feeding assistance: Independent Dressing Assistance:  Maximum assistance     Functional Limitations Info  Sight, Hearing, Speech Sight Info: Adequate Hearing Info: Adequate Speech Info: Adequate    SPECIAL CARE FACTORS FREQUENCY  PT (By licensed PT)     PT Frequency: Evaluated 4/18 and a minimum of 3X per week recommended              Contractures Contractures Info: Not present    Additional Factors Info  Code Status, Allergies, Insulin Sliding Scale Code Status Info: Full Code Allergies Info: Ampicillin, Benadryl   Insulin Sliding Scale Info: 0-9 units sliding scale 3X per day with meals       Current Medications (04/02/2016):  This is the current hospital active medication list Current Facility-Administered Medications  Medication Dose Route Frequency Provider Last Rate Last Dose  . acetaminophen (TYLENOL) tablet 650 mg  650 mg Oral Q6H PRN Gwenyth BenderKaren M Black, NP   650 mg at 04/01/16 2244   Or  . acetaminophen (TYLENOL) suppository 650 mg  650 mg Rectal Q6H PRN Gwenyth BenderKaren M Black, NP      . aspirin EC tablet 81 mg  81 mg Oral Daily Lesle ChrisKaren M Black, NP   81 mg at 04/01/16 1014  . citalopram (CELEXA) tablet 20 mg  20 mg Oral Daily Lesle ChrisKaren M Black, NP   20 mg at 04/01/16 1014  . Darbepoetin Alfa (ARANESP) injection 60 mcg  60 mcg Intravenous Q Mon-HD Weston SettleMartha Bergman, PA-C   60 mcg at 03/31/16 1556  . enoxaparin (LOVENOX) injection 30 mg  30 mg Subcutaneous Q24H Lesle ChrisKaren M  Black, NP   30 mg at 03/30/16 2053  . feeding supplement (BOOST / RESOURCE BREEZE) liquid 1 Container  1 Container Oral BID BM Hollice Espy, MD   1 Container at 04/01/16 1015  . feeding supplement (PRO-STAT SUGAR FREE 64) liquid 30 mL  30 mL Oral Q2000 Hollice Espy, MD   30 mL at 04/01/16 2243  . ferric gluconate (NULECIT) 125 mg in sodium chloride 0.9 % 100 mL IVPB  125 mg Intravenous Q M,W,F-HD Weston Settle, PA-C   125 mg at 03/31/16 1619  . fludrocortisone (FLORINEF) tablet 0.1 mg  0.1 mg Oral Daily Lesle Chris Black, NP   0.1 mg at 04/01/16 1014  . folic acid  (FOLVITE) tablet 1 mg  1 mg Oral Daily Lesle Chris Black, NP   1 mg at 04/01/16 1014  . furosemide (LASIX) injection 20 mg  20 mg Intravenous Q12H Hollice Espy, MD   20 mg at 04/02/16 0818  . insulin aspart (novoLOG) injection 0-9 Units  0-9 Units Subcutaneous TID WC Hollice Espy, MD   1 Units at 04/01/16 1324  . metoprolol succinate (TOPROL-XL) 24 hr tablet 50 mg  50 mg Oral Daily Gwenyth Bender, NP   50 mg at 04/01/16 1014  . morphine 2 MG/ML injection 1 mg  1 mg Intravenous Q4H PRN Gwenyth Bender, NP   1 mg at 03/31/16 0100  . ondansetron (ZOFRAN) tablet 4 mg  4 mg Oral Q6H PRN Gwenyth Bender, NP       Or  . ondansetron [facility]) injection 4 mg  4 mg Intravenous Q6H PRN Gwenyth Bender, NP      . oxyCODONE (Oxy IR/ROXICODONE) immediate release tablet 5 mg  5 mg Oral Q6H PRN Hollice Espy, MD      . oxyCODONE-acetaminophen (PERCOCET/ROXICET) 5-325 MG per tablet 1 tablet  1 tablet Oral Q4H PRN Leanne Chang, NP   1 tablet at 04/02/16 0946  . pantoprazole (PROTONIX) EC tablet 20 mg  20 mg Oral Daily Lesle Chris Black, NP   20 mg at 04/01/16 1014  . potassium chloride SA (K-DUR,KLOR-CON) CR tablet 40 mEq  40 mEq Oral BID Gwenyth Bender, NP   40 mEq at 04/02/16 0941  . senna-docusate (Senokot-S) tablet 1 tablet  1 tablet Oral QHS PRN Lesle Chris Black, NP      . sodium chloride flush (NS) 0.9 % injection 3 mL  3 mL Intravenous Q12H Gwenyth Bender, NP   3 mL at 04/02/16 0943  . zolpidem (AMBIEN) tablet 5 mg  5 mg Oral QHS PRN Gwenyth Bender, NP   5 mg at 04/01/16 2249     Discharge Medications: Please see discharge summary for a list of discharge medications.  Relevant Imaging Results:  Relevant Lab Results:   Additional Information ss#[id number]. Dialysis TTS, 2nd shift - [facility].  Cristobal Goldmann, LCSW

## 2016-04-02 NOTE — Procedures (Signed)
  I was present at this dialysis session, have reviewed the session itself and made  appropriate changes Jeffrey Walls Mathew Storck MD [facility] pager [phone]    cell [phone] 04/02/2016, 4:08 PM

## 2016-04-02 NOTE — Progress Notes (Signed)
Nutrition Follow-up  DOCUMENTATION CODES:   Severe malnutrition in context of chronic illness, Underweight  INTERVENTION:  Continue Boost Breeze po BID, each supplement provides 250 kcal and 9 grams of protein.  Provide 30 ml Prostat po BID, each supplement provides 100 kcal and 15 grams of protein.   Encourage adequate PO intake.   NUTRITION DIAGNOSIS:   Malnutrition related to chronic illness as evidenced by severe depletion of body fat, severe depletion of muscle mass; ongoing  GOAL:   Patient will meet greater than or equal to 90% of their needs; progressing  MONITOR:   PO intake, Supplement acceptance, Weight trends, Labs, I & O's  REASON FOR ASSESSMENT:   Malnutrition Screening Tool    ASSESSMENT:   75 y.o. male with a Past Medical history that includes diabetes type 2, hypertension, coronary artery disease, end-stage renal disease on Monday Wednesday Friday dialysis presents to the emergency Department chief complaint of syncope. Initial evaluation reveals elevated troponin, hypokalemia.   Meal completion has been 50-75%. Pt reports appetite has been "so so". Pt currently has Boost Breeze and Prostat ordered and has been consuming them. RD to additionally increase Prostat to BID to aid in protein needs. Pt does reports not liking the taste of Boost Breeze as much however has been consuming them. Pt was encouraged to eat his food at meals and to consume his supplements.   Potassium elevated at 5.9.  Diet Order:  Diet renal/carb modified with fluid restriction Diet-HS Snack?: Nothing; Room service appropriate?: Yes; Fluid consistency:: Thin  Skin:  Reviewed, no issues  Last BM:  4/19  Height:   Ht Readings from Last 1 Encounters:  03/05/16 5\' 9"  (1.753 m)    Weight:   Wt Readings from Last 1 Encounters:  04/02/16 125 lb 3.5 oz (56.8 kg)    Ideal Body Weight:  72.7 kg  BMI:  Body mass index is 18.48 kg/(m^2).  Estimated Nutritional Needs:   Kcal:   1850-2050  Protein:  85-95 grams  Fluid:  1.2 L/day  EDUCATION NEEDS:   No education needs identified at this time  Roslyn SmilingStephanie Xavion Muscat, MS, RD, LDN Pager # [phone] After hours/ weekend pager # [phone]

## 2016-04-02 NOTE — Clinical Social Work Note (Signed)
CSW informed by attending that patient is not medically stable for discharge today. [name], admissions director with  [facility] contacted and updated.  CSW will continue to follow and facilitate discharge to skilled facility when medically stable.  [name], MSW, LCSW Licensed Clinical Social Worker Clinical Social Work Department [facility] [phone]

## 2016-04-02 NOTE — Care Management Note (Signed)
Case Management Note  Patient Details  Name: Jeffrey Walls MRN: [id number] Date of Birth: 1940-12-28  Subjective/Objective:             CM following for progression and d/c planning.       Action/Plan: 04/01/2016 Met with pt re OBS status, pt very weak. Noted plan for pt to d/c to SNF for rehab. CSW , Shelle Iron working with pt and family re SNF placement.   Expected Discharge Date:   04/02/2016               Expected Discharge Plan:  [place]  In-House Referral:  Clinical Social Work  Discharge planning Services  NA  Post Acute Care Choice:  NA Choice offered to:  NA  DME Arranged:   NA DME Agency:   NA  HH Arranged:   NA HH Agency:   NA  Status of Service:  Completed, signed off  Medicare Important Message Given:    Date Medicare IM Given:    Medicare IM give by:    Date Additional Medicare IM Given:    Additional Medicare Important Message give by:     If discussed at [place] of Stay Meetings, dates discussed:    Additional Comments:  Adron Bene, RN 04/02/2016, 11:24 AM

## 2016-04-02 NOTE — Progress Notes (Signed)
ANTIBIOTIC CONSULT NOTE - INITIAL  Pharmacy Consult for Vancomycin and Ceftazidime Indication: fever and chills, HD cath, ESRD  Allergies  Allergen Reactions  . Ampicillin Rash    Ankle swelling  . Benadryl [Diphenhydramine Hcl] Itching    Rash    Patient Measurements: Height: 5\' 9"  (175.3 cm) Weight: 125 lb 3.5 oz (56.8 kg) IBW/kg (Calculated) : 70.7  Vital Signs: Temp: 98.4 F (36.9 C) (04/19 1600) Temp Source: Oral (04/19 1600) BP: 91/62 mmHg (04/19 1600) Pulse Rate: 96 (04/19 1600)  Labs:  Recent Labs  03/30/16 1649 03/31/16 0452 04/01/16 0559 04/02/16 1350  WBC 7.6 6.4  --   --   HGB 9.0* 9.0*  --   --   PLT 182 180  --   --   CREATININE 3.42* 3.72* 2.01* 3.14*   ESRD  Microbiology: Recent Results (from the past 720 hour(s))  MRSA PCR Screening     Status: None   Collection Time: 03/30/16  6:37 PM  Result Value Ref Range Status   MRSA by PCR NEGATIVE NEGATIVE Final    Comment:        The GeneXpert MRSA Assay (FDA approved for NASAL specimens only), is one component of a comprehensive MRSA colonization surveillance program. It is not intended to diagnose MRSA infection nor to guide or monitor treatment for MRSA infections.   Urine culture     Status: None   Collection Time: 03/31/16  8:58 AM  Result Value Ref Range Status   Specimen Description URINE, CLEAN CATCH  Final   Special Requests NONE  Final   Culture MULTIPLE SPECIES PRESENT, SUGGEST RECOLLECTION  Final   Report Status 04/01/2016 FINAL  Final    Medical History: Past Medical History  Diagnosis Date  . Renal disorder   . Hypertension   . Diabetes mellitus without complication (HCC)   . Coronary artery disease   . Shortness of breath dyspnea   . ANCA-associated vasculitis (HCC)   . Pneumocystis jiroveci pneumonia [facility](HCC)    Assessment:   75 yr old male with ESRD to begin [place] and Fortaz for empiric coverage - fever, chills.  Tmax 102.  WBC 6.4 on 4/17, LA 0.79. Large right  pleural effusion and smaller left effusion. May need thoracentesis.   Currently in HD, with ~1 hr to go.  RN reports 1 blood culture drawn, 2nd blood culture to be drawn peripherally by lab.  Goal of Therapy:  pre-dialysis Vancomycin levels 15-25 mcg/ml  Appropriate Ceftazidime dose for renal function and infection  Plan:   Vancomycin 1 gm IV x 1 after 2nd blood culture drawn.  Vancomycin 500 mg IV after HD (MWF) to begin on 04/04/16.  Fortaz 2 gm IV MWF after dialysis to begin tonight.  Will follow culture data, progress.  Adjust timing of antibiotics if HD schedule changes.  Dennie Fettersgan, Tonique Mendonca Donovan, [place] Pager: [phone] 04/02/2016,4:35 PM

## 2016-04-02 NOTE — Progress Notes (Signed)
PROGRESS NOTE  Jeffrey Walls WUJ:[id number] DOB: 07-15-1941 DOA: 03/30/2016 PCP: Theressa StampsHADLEY,ALEXANDER, MD  HPI/Recap of past 6424 hours:  75 year old male with past mental history of diabetes, hypertension and end-stage renal disease who was recently discharged from skilled nursing facility for rehabilitation and had been having increased weakness as well as came in following a syncopal episode. This has been the second one since patient finished inpatient rehabilitation. The emergency room, labs were unrevealing except for mildly elevated troponin, although this is in the setting of end-stage renal disease. Patient has had no symptoms of chest pain.  Patient doing okay. His description of lightheadedness and weakness seems related to dyspnea on exertion. Less likely orthostatic. When patient complained more dyspnea on exertion in relation to his syncope, chest CT check noted bilateral pleural effusions and interstitial edema. Echocardiogram done noted ejection fraction of 40-45% plus grade 2 diastolic dysfunction plus moderate aortic regurg. Patient started on IV Lasix on the evening of 4/18.  Today, he is breathing somewhat a little bit easier area and he is able to lay more on his back without getting dyspneic  Assessment/Plan: Principal Problem:   Syncope secondary to acute systolic/diastolic heart failure: Felt to be secondary to sudden hypoxia from acute congestive heart failure. Echocardiogram confirms this. Orthostatics have been unrevealing. Since he still makes urine, we'll manage with IV Lasix Active Problems:   ESRD on hemodialysis [facility](HCC):  [place] a session scheduled for 4/19. Nephrology following   Anemia of renal disease: Management as per nephrology   Frequent falls: Likely secondary to hypoxia from volume overload   Essential hypertension: Antihypertensives initially held secondary to concerns for orthostatic hypotension causing syncope, now we can restart. .   Diabetes mellitus with  diabetic nephropathy, with long-term current use of insulin (HCC): Sliding scale. A1c low at 5.5. Concerned about episodes of hypoglycemia.   Elevated troponin: Stable, likely secondary to renal disease   Code Status: Full code  Family Communication: Spoke with daughter at the bedside  Disposition Plan: Potential discharge to skilled nursing in next 24-48 hours, once fully diuresed   Consultants:  Nephrology  Procedures:  Dialysis Monday Wednesday Friday   Echocardiogram done 4/18: Moderate aortic regurg, ejection fraction 40-45 percent, grade 2 diastolic dysfunction  Antibiotics:  None   Objective: BP 115/62 mmHg  Pulse 59  Temp(Src) 98.1 F (36.7 C) (Oral)  Resp 13  Wt 54.9 kg (121 lb 0.5 oz)  SpO2 98%  Intake/Output Summary (Last 24 hours) at 04/02/16 1426 Last data filed at 04/02/16 1414  Gross per 24 hour  Intake    783 ml  Output    500 ml  Net    283 ml   Filed Weights   03/31/16 1355 03/31/16 1815 04/01/16 2213  Weight: 55.2 kg (121 lb 11.1 oz) 55.2 kg (121 lb 11.1 oz) 54.9 kg (121 lb 0.5 oz)    Exam:   General:  Alert and oriented 3   Cardiovascular: Regular rate and rhythm, S1-S2   Respiratory: Clear to auscultation bilaterally   Abdomen: Soft, nontender, nondistended, positive bowel sounds  Musculoskeletal: No clubbing or cyanosis, trace edema \  Data Reviewed: Basic Metabolic Panel:  Recent Labs Lab 03/30/16 1447 03/30/16 1649 03/31/16 0452 04/01/16 0559  NA 137  --  138 135  K 3.1*  --  2.9* 4.4  CL 97*  --  97* 99*  CO2 28  --  31 28  GLUCOSE 105*  --  115* 149*  BUN 12  --  17 11  CREATININE 3.24* 3.42* 3.72* 2.01*  CALCIUM 8.1*  --  8.0* 7.4*  MG  --  1.7  --   --   PHOS  --  3.1  --   --    Liver Function Tests:  Recent Labs Lab 03/30/16 1447  AST 26  ALT 19  ALKPHOS 102  BILITOT 1.2  PROT 4.3*  ALBUMIN 2.3*   No results for input(s): LIPASE, AMYLASE in the last 168 hours. No results for input(s): AMMONIA in  the last 168 hours. CBC:  Recent Labs Lab 03/30/16 1447 03/30/16 1649 03/31/16 0452  WBC 8.1 7.6 6.4  NEUTROABS 6.3  --   --   HGB 8.8* 9.0* 9.0*  HCT 27.7* 27.8* 28.0*  MCV 87.7 88.3 87.5  PLT 185 182 180   Cardiac Enzymes:    Recent Labs Lab 03/30/16 2138 03/31/16 0452  TROPONINI 0.51* 0.49*   BNP (last 3 results)  Recent Labs  02/28/16 2200  BNP >4500.0*    ProBNP (last 3 results) No results for input(s): PROBNP in the last 8760 hours.  CBG:  Recent Labs Lab 04/01/16 1214 04/01/16 1710 04/01/16 2211 04/02/16 0815 04/02/16 1204  GLUCAP 135* 90 102* 86 131*    Recent Results (from the past 240 hour(s))  MRSA PCR Screening     Status: None   Collection Time: 03/30/16  6:37 PM  Result Value Ref Range Status   MRSA by PCR NEGATIVE NEGATIVE Final    Comment:        The GeneXpert MRSA Assay (FDA approved for NASAL specimens only), is one component of a comprehensive MRSA colonization surveillance program. It is not intended to diagnose MRSA infection nor to guide or monitor treatment for MRSA infections.   Urine culture     Status: None   Collection Time: 03/31/16  8:58 AM  Result Value Ref Range Status   Specimen Description URINE, CLEAN CATCH  Final   Special Requests NONE  Final   Culture MULTIPLE SPECIES PRESENT, SUGGEST RECOLLECTION  Final   Report Status 04/01/2016 FINAL  Final     Studies: No results found.  Scheduled Meds: . aspirin EC  81 mg Oral Daily  . citalopram  20 mg Oral Daily  . darbepoetin (ARANESP) injection - DIALYSIS  60 mcg Intravenous Q Mon-HD  . enoxaparin (LOVENOX) injection  30 mg Subcutaneous Q24H  . feeding supplement  1 Container Oral BID BM  . feeding supplement (PRO-STAT SUGAR FREE 64)  30 mL Oral Q2000  . ferric gluconate (FERRLECIT/NULECIT) IV  125 mg Intravenous Q M,W,F-HD  . fludrocortisone  0.1 mg Oral Daily  . folic acid  1 mg Oral Daily  . furosemide  20 mg Intravenous Q12H  . insulin aspart  0-9  Units Subcutaneous TID WC  . metoprolol succinate  50 mg Oral Daily  . pantoprazole  20 mg Oral Daily  . potassium chloride  40 mEq Oral BID  . sodium chloride flush  3 mL Intravenous Q12H    Continuous Infusions:    Time spent: 15 minutes  Hollice Espy  [facility] Pager [phone] . If 7PM-7AM, please contact night-coverage at [email or web address], password [facility] 04/02/2016, 2:26 PM  LOS: 0 days

## 2016-04-02 NOTE — Clinical Social Work Note (Signed)
Clinical Social Work Assessment  Patient Details  Name: Jeffrey Walls MRN: [id number] Date of Birth: February 01, 1941  Date of referral:  04/02/16               Reason for consult:  Facility Placement                Permission sought to share information with:  Family Supports Permission granted to share information::  Yes, Verbal Permission Granted  Name::     Jeffrey Walls   Agency::     Relationship::  Daughter  Contact Information:  [phone]  Housing/Transportation Living arrangements for the past 2 months:  Skilled Nursing Facility ([facility] and prior to this lived at home alone) Source of Information:  Patient Patient Interpreter Needed:  None Criminal Activity/Legal Involvement Pertinent to Current Situation/Hospitalization:  No - Comment as needed Significant Relationships:  Adult Children (Patient's 2 daughters live in [place], [place]) Lives with:  Self Do you feel safe going back to the place where you live?  No (Patient reports that he is still weak and needs continuned rehab) Need for family participation in patient care:  No (Coment)  Care giving concerns:  Patient expressed no concerns regarding care during hospitalization.   Social Worker assessment / plan:  CSW talked with patient at the bedside regarding discharge planning and recommendation of ST rehab. [name] was lying in bed with eyes closed, but responded immediately to CSW voice and was able to converse with CSW. Patient reported that he knows he needs continued rehab as he is still very weak. When asked, [name] [facility] and wants to return there for continued rehab. Patient advised that he is ready for d/c today (after dialysis).  Employment status:  Retired Office managernsurance information:  Managed Medicare ([employer] HMO) PT Recommendations:  Skilled Nursing Facility Information / Referral to community resources:  Other (Comment Required) (Patient not given SNF  list as he has a facility preference)  Patient/Family's Response to care:  No concerns expressed.  Patient/Family's Understanding of and Emotional Response to Diagnosis, Current Treatment, and Prognosis:  Not discussed.  Emotional Assessment Appearance:  Appears stated age Attitude/Demeanor/Rapport:  Other (Appropriate) Affect (typically observed):  Appropriate, Pleasant Orientation:  Oriented to Self, Oriented to Place, Oriented to  Time, Oriented to Situation Alcohol / Substance use:  Never Used Psych involvement (Current and /or in the community):  No (Comment)  Discharge Needs  Concerns to be addressed:  Discharge Planning Concerns Readmission within the last 30 days:  No Current discharge risk:  None Barriers to Discharge:  No Barriers Identified   Cristobal GoldmannCrawford, Zyrus Hetland Bradley, LCSW 04/02/2016, 10:53 AM

## 2016-04-02 NOTE — Progress Notes (Signed)
Echocardiogram 2D Echocardiogram has been performed.  Jeffrey Walls, [name] M 04/02/2016, 8:40 AM

## 2016-04-02 NOTE — Progress Notes (Signed)
Patient returned from hemodialysis and refusing daily medications at this time.  Patient stated he wanted to wait until 8 or 9 o'clock tonight.  Medications administration time moved to 9pm.

## 2016-04-02 NOTE — Progress Notes (Signed)
Remains OBS status. BP's high normal, 97% on 2L [place]. No LE edema but has symptomatic pleural effusions/ IS edema by CT, plan HD today w extra volume removal as tolerated. If not tolerated, would suggest R thoracentesis.   Vinson Moselleob Suanne Minahan MD [facility] pager [phone]    cell [phone] 04/02/2016, 12:40 PM

## 2016-04-03 ENCOUNTER — Inpatient Hospital Stay (HOSPITAL_COMMUNITY): Payer: Medicare HMO

## 2016-04-03 LAB — GLUCOSE, CAPILLARY
GLUCOSE-CAPILLARY: 103 mg/dL — AB (ref 65–99)
GLUCOSE-CAPILLARY: 149 mg/dL — AB (ref 65–99)
GLUCOSE-CAPILLARY: 138 mg/dL — AB (ref 65–99)
Glucose-Capillary: 124 mg/dL — ABNORMAL HIGH (ref 65–99)
Glucose-Capillary: 155 mg/dL — ABNORMAL HIGH (ref 65–99)

## 2016-04-03 NOTE — Progress Notes (Addendum)
Occupational Therapy Evaluation Patient Details Name: Jeffrey Walls MRN: [id number] DOB: [birth year] Today's Date: 04/03/2016    History of Present Illness 75 year old male with past mental history of diabetes, hypertension and end-stage renal disease who was recently discharged from skilled nursing facility for rehabilitation and had been having increased weakness as well as came in following a syncopal episode. This has been the second one since patient finished inpatient rehabilitation.   Clinical Impression   Pt admitted with the above diagnoses and presents with below problem list. Pt will benefit from continued acute OT to address the below listed deficits and maximize independence with BADLs. PTA pt was mod I with ADLs, sounds like he sponge bathed and did not do shower transfers.  Sat EOB about 4 minutes before requesting to lay back down due to lightheadedness and nausea. Nursing notified. Session details below.      Follow Up Recommendations  SNF    Equipment Recommendations  Other (comment) (defer to next venue)    Recommendations for Other Services       Precautions / Restrictions Precautions Precautions: Fall      Mobility Bed Mobility Overal bed mobility: Needs Assistance Bed Mobility: Supine to Sit;Sit to Supine     Supine to sit: Min assist Sit to supine: Min assist   General bed mobility comments: min A to powerup and control descent. used bedrails. HOB elevated.  Transfers                 General transfer comment: did not attempt due to lightheadedness and nausea EOB with pt stating he needed to lay back down    Balance Overall balance assessment: Needs assistance;History of Falls Sitting-balance support: Bilateral upper extremity supported;Feet supported Sitting balance-Leahy Scale: Fair Sitting balance - Comments: Pt with BUE on mattress for support sitting EOB. Sat about 4 minutes.                                    ADL  Overall ADL's : Needs assistance/impaired Eating/Feeding: Set up;Sitting   Grooming: Set up;Bed level;Sitting   Upper Body Bathing: Minimal assitance;Sitting   Lower Body Bathing: Minimal assistance;Sitting/lateral leans;Sit to/from stand   Upper Body Dressing : Minimal assistance;Sitting   Lower Body Dressing: Minimal assistance;Sitting/lateral leans;Sit to/from stand   Toilet Transfer: Minimal assistance;Stand-pivot;BSC   Toileting- Clothing Manipulation and Hygiene: Minimal assistance;Sit to/from Nurse, children's Details (indicate cue type and reason): sponge bath at baseline   General ADL Comments: Pt sat EOB about 4 minutes before requesting to lay down due to nausea and lightheadedness. Pt using BUE for support sitting EOB, unable to access feet this session with therapist donning socks with pt in seated position.      Vision     Perception     Praxis      Pertinent Vitals/Pain Pain Assessment: Faces Faces Pain Scale: Hurts little more Pain Location: chronic back pain Pain Descriptors / Indicators: Aching Pain Intervention(s): Monitored during session;Repositioned     Hand Dominance Right   Extremity/Trunk Assessment Upper Extremity Assessment Upper Extremity Assessment: Generalized weakness   Lower Extremity Assessment Lower Extremity Assessment: Defer to PT evaluation       Communication Communication Communication: No difficulties   Cognition Arousal/Alertness: Awake/alert Behavior During Therapy: Flat affect;WFL for tasks assessed/performed Overall Cognitive Status: Within Functional Limits for tasks assessed  General Comments    Attempted O2 reading on RA sitting EOB could not achieve a reliable reading before pt stated that he felt lightheaded and nauseous. [place] placed back on pt and pt returned to supine position. Nursing notified.    Exercises       Shoulder Instructions      Home Living  Family/patient expects to be discharged to:: Private residence Living Arrangements: Alone Available Help at Discharge: Personal care attendant;Available PRN/intermittently (Provides assist with medication management only)) Type of Home: House Home Access: Stairs to enter Entrance Stairs-Number of Steps: 2 Entrance Stairs-Rails: None Home Layout: One level     Bathroom Shower/Tub: Producer, television/film/videoWalk-in shower   Bathroom Toilet: Standard     Home Equipment: Environmental consultantWalker - 2 wheels;Cane - single point;Shower seat   Additional Comments: Pt reports he sponge bathes at baseline. Pt also reports difficulty navigating rw through house due to layout of furniture>door width.       Prior Functioning/Environment Level of Independence: Independent with assistive device(s)        Comments: Uses cane for ambulation.  Drives self to HD.    OT Diagnosis: Generalized weakness   OT Problem List: Decreased strength;Decreased activity tolerance;Impaired balance (sitting and/or standing);Decreased knowledge of use of DME or AE;Decreased knowledge of precautions;Cardiopulmonary status limiting activity;Pain   OT Treatment/Interventions: Self-care/ADL training;Therapeutic exercise;Energy conservation;DME and/or AE instruction;Therapeutic activities;Patient/family education;Balance training    OT Goals(Current goals can be found in the care plan section) Acute Rehab OT Goals Patient Stated Goal: Did not state OT Goal Formulation: With patient Time For Goal Achievement: 04/17/16 Potential to Achieve Goals: Good ADL Goals Pt Will Perform Grooming: with modified independence;sitting Pt Will Perform Upper Body Bathing: with modified independence;sitting Pt Will Perform Lower Body Bathing: with modified independence;sit to/from stand;sitting/lateral leans;with adaptive equipment Pt Will Perform Upper Body Dressing: with modified independence;sitting Pt Will Perform Lower Body Dressing: with modified  independence;sitting/lateral leans;sit to/from stand;with adaptive equipment Pt Will Transfer to Toilet: with modified independence;ambulating;bedside commode Pt Will Perform Toileting - Clothing Manipulation and hygiene: with modified independence;sit to/from stand;sitting/lateral leans  OT Frequency: Min 2X/week   Barriers to D/C: Decreased caregiver support          Co-evaluation              End of Session Equipment Utilized During Treatment: Oxygen Nurse Communication: Other (comment) (see general comments); discussed obtaining O2 reading on RA prior to session   Activity Tolerance:   Patient left: in bed;with call bell/phone within reach;with bed alarm set   Time: 1006-1035 OT Time Calculation (min): 29 min Charges:  OT General Charges $OT Visit: 1 Procedure OT Evaluation $OT Eval Low Complexity: 1 Procedure OT Treatments $Self Care/Home Management : 8-22 mins G-Codes:    Pilar GrammesMathews, Oluwateniola Leitch H 04/03/2016, 10:58 AM

## 2016-04-03 NOTE — Clinical Documentation Improvement (Signed)
Internal Medicine  Can the diagnosis of Hypoxia be further specified? Please see notes below and document findings in next progress note if applicable. Thank you!   Document Acuity - Acute, Chronic, Acute on Chronic  Document Inclusion Of - Hypoxia, Hypercapnia, Combination of Both   Only Hypoxia as documented; Respiratory Failure ruled out  Other  Clinically Undetermined  Document any associated diagnoses/conditions.  Supporting Information:  Respiratory rates ranging from 16 to 26 on admission; HR > 90 with oxygen saturations at times in the 91% range  Treated with 2L of FiO2 with improvement noted  Please exercise your independent, professional judgment when responding. A specific answer is not anticipated or expected.  Thank You,  Shellee MiloEileen T Kuulei Kleier RN, BSN, CCDS Health Information Management [place] [phone]; Cell: [phone]

## 2016-04-03 NOTE — Progress Notes (Signed)
CRITICAL VALUE ALERT  Critical value received:  Second set of blood cultures positive. Gram positive cocci in clusters.  Date of notification:  04/03/16  Time of notification:  1644  Critical value read back:Yes.    Nurse who received alert:  Hortense Ramalybill Wofford Stratton, RN  MD notified (1st page):  Dr. Chancy MilroyS. Krishnan  Time of first page:  1648  MD notified (2nd page):  Time of second page:  Responding MD:  Dr. Chancy MilroyS. Krishnan  Time MD responded:  [phone]

## 2016-04-03 NOTE — Progress Notes (Signed)
CRITICAL VALUE ALERT  Critical value received:  Positive blood culture in aerobic bottle. Gram positive cocci in clusters.  Date of notification:  04/03/16   Time of notification:  1312  Critical value read back:Yes.    Nurse who received alert:  Hortense Ramalybill Lerae Langham, RN  MD notified (1st page):  Dr. Chancy MilroyS. Krishnan  Time of first page:  1318  MD notified (2nd page):  Time of second page:  Responding MD:  Dr. Chancy MilroyS. Krishnan  Time MD responded:  1318, MD on unit.   Leanna BattlesEckelmann, Tully Mcinturff Eileen, RN.

## 2016-04-03 NOTE — Progress Notes (Signed)
[facility] Progress Note  Assessment/Plan: 1.  Syncopal episode - still has dizziness with moving from supine to sitting on side of bed position; outpatient staff hadn't been doing orthostatic BPs -  2.  DOE/pul edema/pleural effusions - net UF 3.2 4/19 with post weight 53.6  3. ESRD MWF - needs orthostatics pre and post HD- no heparin due to hx SDH 4. Hyperkalemia -corrected with HD 5. 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 HTN- on MTP/lisinopril 8. Nutrition alb - low; losing weight- new EDW for d/c; pt lives alone- doesn't seem like he has many close friends or local family 9. Hx ANCA vasculitis 10. Fever/chills -TMax 100.3, BC pending, CXR neg infiltrate- on Vanc and Fortaz 11. DM 12. Florinef-unclear hx behind using this med apparently on prior to transitioning to our care 13. Deconditioning - has had prior placement for rehab 14. Frequent falls- hx SDH Jan -  15,. Back pain - neg lumbar-thoracic films, no bruising from recent fall.  Jeffrey Slider, PA-C  [facility] [phone] 04/03/2016,10:34 AM  LOS: 1 day   Pt seen, examined and agree w A/P as above.  Dyspnea better, cont to lower vol and dry wt w HD Friday.  [name] MD [facility] pager [phone]    cell [phone] 04/03/2016, 1:37 PM    Subjective:   C/o being hot/cold on HD yesterday, low back hurts since fall.  Objective Filed Vitals:   04/02/16 2117 04/03/16 0434 04/03/16 0448 04/03/16 0911  BP: 127/53  145/58 143/61  Pulse: 76  57 60  Temp:   98.7 F (37.1 C) 97.3 F (36.3 C)  TempSrc:   Oral Oral  Resp: Height:      Weight:  54.7 kg (120 lb 9.5 oz)    SpO2:   100% 100%   Physical Exam General: thin, somewhat emaciated WM Heart: RRR Lungs: dim BS no rales Abdomen: soft NT Extremities: no LE edema Dialysis Access: left   AVF maturing and right IJ  Dialysis Orders: MWF [facility] 56kg-leaving below last two tmt 2/2.25 bath Hep none LUA AVF (maturing)/ R IJ cath Mircera 30 ug every 2, last 4/12 Hep lock for cath tfs 27%, ferr 2787 Hb 10.5 pth 115 Ca/P ok  Additional Objective Labs: Basic Metabolic Panel:  Recent Labs Lab 03/30/16 1649 03/31/16 0452 04/01/16 0559 04/02/16 1350  NA  --  138 135 135  K  --  2.9* 4.4 5.9*  CL  --  97* 99* 101  CO2  --  GLUCOSE  --  115* 149* 134*  BUN  --  CREATININE 3.42* 3.72* 2.01* 3.14*  CALCIUM  --  8.0* 7.4* 8.1*  PHOS 3.1  --   --   --    Liver Function Tests:  Recent Labs Lab 03/30/16 1447  AST 26  ALT 19  ALKPHOS 102  BILITOT 1.2  PROT 4.3*  ALBUMIN 2.3*  CBC:  Recent Labs Lab 03/30/16 1447 03/30/16 1649 03/31/16 0452  WBC 8.1 7.6 6.4  NEUTROABS 6.3  --   --   HGB 8.8* 9.0* 9.0*  HCT 27.7* 27.8* 28.0*  MCV 87.7 88.3 87.5  PLT 185 182 180   Blood Culture    Component Value Date/Time   SDES URINE, CLEAN CATCH  03/31/2016 0858   SPECREQUEST NONE 03/31/2016 0858   CULT MULTIPLE SPECIES PRESENT, SUGGEST RECOLLECTION 03/31/2016 0858   REPTSTATUS 04/01/2016 FINAL 03/31/2016 0858    Cardiac Enzymes:  Recent Labs Lab 03/30/16 2138 03/31/16 0452  TROPONINI 0.51* 0.49*   CBG:  Recent Labs Lab 04/02/16 1546 04/02/16 1836 04/02/16 2019 04/03/16 0446 04/03/16 0731  GLUCAP 107* 137* 169* 124* 103*    Studies/Results: Ct Chest Wo Contrast  04/01/2016  CLINICAL DATA:  Shortness of breath today in patient admitted to the hospital with a chief complaint of syncope on 03/30/2016. The patient denies cough. End-stage renal disease. Initial encounter. EXAM: CT CHEST WITHOUT CONTRAST TECHNIQUE: Multidetector CT imaging of the chest was performed following the standard protocol without IV contrast. COMPARISON:  PA and lateral chest 02/28/2016. FINDINGS: The patient has moderate bilateral pleural effusions, greater on  the right. Mild cardiomegaly is seen. Small pericardial effusion is identified. Extensive calcific coronary artery disease is present. Dialysis catheter from a right IJ approach is noted. There is no axillary, hilar or mediastinal lymphadenopathy. The lungs demonstrate mild compressive atelectasis secondary to the patient's pleural effusions, slightly greater on the right. There is also some interlobular septal thickening in the upper lobes which is worse on the left. The lungs are otherwise unremarkable. Visualized upper abdomen demonstrates extensive atherosclerosis. No focal abnormality is seen. No focal bony abnormality is identified. IMPRESSION: Moderate bilateral pleural effusions, larger on the right. Interlobular septal thickening in the upper lobes bilaterally is compatible with mild edema. Small pericardial effusion. Extensive calcific coronary artery disease. Electronically Signed   By: Drusilla Kannerhomas  Dalessio M.D.   On: 04/01/2016 13:15   Medications:   . aspirin EC  81 mg Oral Daily  . cefTAZidime (FORTAZ)  IV  2 g Intravenous Q M,W,F-1800  . citalopram  20 mg Oral Daily  . darbepoetin (ARANESP) injection - DIALYSIS  60 mcg Intravenous Q Mon-HD  . enoxaparin (LOVENOX) injection  30 mg Subcutaneous Q24H  . feeding supplement  1 Container Oral BID BM  . feeding supplement (PRO-STAT SUGAR FREE 64)  30 mL Oral Q2000  . ferric gluconate (FERRLECIT/NULECIT) IV  125 mg Intravenous Q M,W,F-HD  . fludrocortisone  0.1 mg Oral Daily  . folic acid  1 mg Oral Daily  . insulin aspart  0-9 Units Subcutaneous TID WC  . lisinopril  20 mg Oral Daily  . metoprolol succinate  50 mg Oral Daily  . pantoprazole  20 mg Oral Daily  . sodium chloride flush  3 mL Intravenous Q12H  . [START ON 04/04/2016] vancomycin  500 mg Intravenous Q M,W,F-HD

## 2016-04-03 NOTE — Progress Notes (Signed)
Physical Therapy Treatment Patient Details Name: Christianne Dolindgar Touchette MRN: [id number] DOB: [birth year] Today's Date: 04/03/2016    History of Present Illness 75 year old male with past mental history of diabetes, hypertension and end-stage renal disease who was recently discharged from skilled nursing facility for rehabilitation and had been having increased weakness as well as came in following a syncopal episode. This has been the second one since patient finished inpatient rehabilitation.    PT Comments    Patient slow to progress.  Agree with need for SNF at d/c for continued therapy.  Follow Up Recommendations  SNF     Equipment Recommendations  3in1 (PT)    Recommendations for Other Services       Precautions / Restrictions Precautions Precautions: Fall Restrictions Weight Bearing Restrictions: No    Mobility  Bed Mobility Overal bed mobility: Needs Assistance Bed Mobility: Supine to Sit;Sit to Supine     Supine to sit: Min assist Sit to supine: Min guard   General bed mobility comments: Assist to raise trunk to upright position.  Transfers Overall transfer level: Needs assistance Equipment used: Rolling walker (2 wheeled) Transfers: Sit to/from Stand Sit to Stand: Min assist         General transfer comment: Assist to steady during transfers. In standing, patient able to take steps in place - 10 with each foot.  Patient fatigued quickly and felt lightheaded.  Sitting rest break and then repeated stance/stepping in place.  Ambulation/Gait             General Gait Details: Unable to attempt   Stairs            Wheelchair Mobility    Modified Rankin (Stroke Patients Only)       Balance           Standing balance support: Single extremity supported;During functional activity Standing balance-Leahy Scale: Poor                      Cognition Arousal/Alertness: Awake/alert Behavior During Therapy: WFL for tasks  assessed/performed;Flat affect Overall Cognitive Status: Within Functional Limits for tasks assessed                      Exercises      General Comments        Pertinent Vitals/Pain Pain Assessment: Faces Faces Pain Scale: Hurts even more Pain Location: back Pain Descriptors / Indicators: Aching;Sore Pain Intervention(s): Limited activity within patient's tolerance;Monitored during session;Repositioned    Home Living                      Prior Function            PT Goals (current goals can now be found in the care plan section) Progress towards PT goals: Progressing toward goals    Frequency  Min 3X/week    PT Plan Current plan remains appropriate    Co-evaluation             End of Session Equipment Utilized During Treatment: Gait belt;Oxygen Activity Tolerance: Patient limited by fatigue;Patient limited by pain Patient left: in bed;with call bell/phone within reach (Encouraged [facility] to keep [facility])     Time: [id number] PT Time Calculation (min) (ACUTE ONLY): 9 min  Charges:  $Therapeutic Activity: 8-22 mins                    G Codes:      Vena AustriaDavis, Adamari Frede H  04/03/2016, 2:57 PM Durenda Hurt. Renaldo Fiddler, [facility] Acute Rehab Services Pager [phone]

## 2016-04-03 NOTE — Progress Notes (Signed)
PROGRESS NOTE  Jeffrey Walls WUJ:[id number] DOB: Mar 14, 1941 DOA: 03/30/2016 PCP: Theressa Stamps, MD  HPI/Recap of past 57 hours:  75 year old male with past mental history of diabetes, hypertension and end-stage renal disease who was recently discharged from skilled nursing facility for rehabilitation and had been having increased weakness as well as came in following a syncopal episode. This has been the second one since patient finished inpatient rehabilitation. The emergency room, labs were unrevealing except for mildly elevated troponin, although this is in the setting of end-stage renal disease. Patient has had no symptoms of chest pain.  Patient doing okay. His description of lightheadedness and weakness seems related to dyspnea on exertion. Less likely orthostatic. When patient complained more dyspnea on exertion in relation to his syncope, chest CT check noted bilateral pleural effusions and interstitial edema. Echocardiogram done noted ejection fraction of 40-45% plus grade 2 diastolic dysfunction plus moderate aortic regurg. Patient started on IV Lasix on the evening of 4/18.  Patient started breathing easier afterwards and was able to lay more on his back without getting dyspneic.  Had dialysis on night of 4/19, patient suddenly started having chills. Low-grade temperature 100. Nephrology started antibiotics after getting blood cultures. Patient today doing okay. Says he gets lightheaded when oxygen is removed although reportedly no significant oxygen desaturation  Assessment/Plan: Principal Problem:   Syncope secondary to acute systolic/diastolic heart failure: Felt to be secondary to sudden hypoxia from acute congestive heart failure. Echocardiogram confirms this. Orthostatics have been unrevealing. Since he still makes urine, trying to manage with Lasix and dialysis. Will check two-view chest x-ray. If little improvement, we'll get thoracentesis. We'll check ambulatory pulse ox for  oxygen saturations levels Active Problems:   ESRD on hemodialysis [facility]): Nephrology managing. Last dialysis was 4/19. Nephrology following   Anemia of renal disease: Management as per nephrology   Frequent falls: Likely secondary to hypoxia from volume overload   Essential hypertension: Antihypertensives initially held secondary to concerns for orthostatic hypotension causing syncope, restarted 4/19. Blood pressures improving    Diabetes mellitus with diabetic nephropathy, with long-term current use of insulin (HCC): Sliding scale. A1c low at 5.5. Concerned about episodes of hypoglycemia. Monitoring CBGs   Elevated troponin: Stable, likely secondary to renal disease. No evidence of ACS  Low-grade temperature: Urinalysis negative. Initial chest x-ray noted no evidence of pneumonia. Continue to monitor for signs of infection   Code Status: Full code  Family Communication: Updated daughter by phone today  Disposition Plan: Discharged to skilled nursing once breathing more comfortably   Consultants:  Nephrology  Procedures:  Dialysis Monday Wednesday Friday   Echocardiogram done 4/18: Moderate aortic regurg, ejection fraction 40-45 percent, grade 2 diastolic dysfunction  Antibiotics:  None   Objective: BP 143/61 mmHg  Pulse 60  Temp(Src) 97.3 F (36.3 C) (Oral)  Resp 17  Ht  (1.753 m)  Wt 54.7 kg (120 lb 9.5 oz)  BMI 17.80 kg/m2  SpO2 100%  Intake/Output Summary (Last 24 hours) at 04/03/16 1207 Last data filed at 04/03/16 0900  Gross per 24 hour  Intake    850 ml  Output   3379 ml  Net  -2529 ml   Filed Weights   04/02/16 1357 04/02/16 1730 04/03/16 0434  Weight: 56.8 kg (125 lb 3.5 oz) 53.6 kg (118 lb 2.7 oz) 54.7 kg (120 lb 9.5 oz)    Exam:   General:  Alert and oriented 3   Cardiovascular: Regular rate and rhythm, S1-S2   Respiratory: Clear to  auscultation bilaterally decreased at bases  Abdomen: Soft, nontender, nondistended, positive bowel  sounds  Musculoskeletal: No clubbing or cyanosis, trace edema \  Data Reviewed: Basic Metabolic Panel:  Recent Labs Lab 03/30/16 1447 03/30/16 1649 03/31/16 0452 04/01/16 0559 04/02/16 1350  NA 137  --  138 135 135  Walls 3.1*  --  2.9* 4.4 5.9*  CL 97*  --  97* 99* 101  CO2 28  --  GLUCOSE 105*  --  115* 149* 134*  BUN 12  --  CREATININE 3.24* 3.42* 3.72* 2.01* 3.14*  CALCIUM 8.1*  --  8.0* 7.4* 8.1*  MG  --  1.7  --   --   --   PHOS  --  3.1  --   --   --    Liver Function Tests:  Recent Labs Lab 03/30/16 1447  AST 26  ALT 19  ALKPHOS 102  BILITOT 1.2  PROT 4.3*  ALBUMIN 2.3*   No results for input(s): LIPASE, AMYLASE in the last 168 hours. No results for input(s): AMMONIA in the last 168 hours. CBC:  Recent Labs Lab 03/30/16 1447 03/30/16 1649 03/31/16 0452  WBC 8.1 7.6 6.4  NEUTROABS 6.3  --   --   HGB 8.8* 9.0* 9.0*  HCT 27.7* 27.8* 28.0*  MCV 87.7 88.3 87.5  PLT 185 182 180   Cardiac Enzymes:    Recent Labs Lab 03/30/16 2138 03/31/16 0452  TROPONINI 0.51* 0.49*   BNP (last 3 results)  Recent Labs  02/28/16 2200  BNP >4500.0*    ProBNP (last 3 results) No results for input(s): PROBNP in the last 8760 hours.  CBG:  Recent Labs Lab 04/02/16 1546 04/02/16 1836 04/02/16 2019 04/03/16 0446 04/03/16 0731  GLUCAP 107* 137* 169* 124* 103*    Recent Results (from the past 240 hour(s))  MRSA PCR Screening     Status: None   Collection Time: 03/30/16  6:37 PM  Result Value Ref Range Status   MRSA by PCR NEGATIVE NEGATIVE Final    Comment:        The GeneXpert MRSA Assay (FDA approved for NASAL specimens only), is one component of a comprehensive MRSA colonization surveillance program. It is not intended to diagnose MRSA infection nor to guide or monitor treatment for MRSA infections.   Urine culture     Status: None   Collection Time: 03/31/16  8:58 AM  Result Value Ref Range Status   Specimen Description  URINE, CLEAN CATCH  Final   Special Requests NONE  Final   Culture MULTIPLE SPECIES PRESENT, SUGGEST RECOLLECTION  Final   Report Status 04/01/2016 FINAL  Final     Studies: No results found.  Scheduled Meds: . aspirin EC  81 mg Oral Daily  . cefTAZidime (FORTAZ)  IV  2 g Intravenous Q M,W,F-1800  . citalopram  20 mg Oral Daily  . darbepoetin (ARANESP) injection - DIALYSIS  60 mcg Intravenous Q Mon-HD  . enoxaparin (LOVENOX) injection  30 mg Subcutaneous Q24H  . feeding supplement  1 Container Oral BID BM  . feeding supplement (PRO-STAT SUGAR FREE 64)  30 mL Oral Q2000  . ferric gluconate (FERRLECIT/NULECIT) IV  125 mg Intravenous Q M,W,F-HD  . fludrocortisone  0.1 mg Oral Daily  . folic acid  1 mg Oral Daily  . insulin aspart  0-9 Units Subcutaneous TID WC  . lisinopril  20 mg Oral Daily  . metoprolol succinate  50  mg Oral Daily  . pantoprazole  20 mg Oral Daily  . sodium chloride flush  3 mL Intravenous Q12H  . [START ON 04/04/2016] vancomycin  500 mg Intravenous Q M,W,F-HD    Continuous Infusions:    Time spent: 35 minutes  Hollice EspyKRISHNAN,Jeffrey Walls  [facility] Pager [phone] . If 7PM-7AM, please contact night-coverage at [email or web address], password [email or web address] 04/03/2016, 12:07 PM  LOS: 1 day

## 2016-04-03 NOTE — Progress Notes (Signed)
Patient's IV access was leaking/not working this morning. PIV removed per protocol. Notified Dr. [name], who stated it was okay to leave PIV out until tomorrow. Patient does not have anything IV scheduled until after HD tomorrow.   Jeffrey Walls, Jeffrey Appling Eileen, RN.

## 2016-04-04 DIAGNOSIS — R7881 Bacteremia: Secondary | ICD-10-CM

## 2016-04-04 DIAGNOSIS — N185 Chronic kidney disease, stage 5: Secondary | ICD-10-CM

## 2016-04-04 DIAGNOSIS — T827XXA Infection and inflammatory reaction due to other cardiac and vascular devices, implants and grafts, initial encounter: Principal | ICD-10-CM

## 2016-04-04 DIAGNOSIS — B957 Other staphylococcus as the cause of diseases classified elsewhere: Secondary | ICD-10-CM

## 2016-04-04 DIAGNOSIS — I5189 Other ill-defined heart diseases: Secondary | ICD-10-CM

## 2016-04-04 DIAGNOSIS — Y838 Other surgical procedures as the cause of abnormal reaction of the patient, or of later complication, without mention of misadventure at the time of the procedure: Secondary | ICD-10-CM

## 2016-04-04 LAB — GLUCOSE, CAPILLARY
GLUCOSE-CAPILLARY: 157 mg/dL — AB (ref 65–99)
GLUCOSE-CAPILLARY: 101 mg/dL — AB (ref 65–99)
GLUCOSE-CAPILLARY: 70 mg/dL (ref 65–99)
Glucose-Capillary: 166 mg/dL — ABNORMAL HIGH (ref 65–99)

## 2016-04-04 LAB — RENAL FUNCTION PANEL
Albumin: 2 g/dL — ABNORMAL LOW (ref 3.5–5.0)
Anion gap: 9 (ref 5–15)
BUN: 19 mg/dL (ref 6–20)
CALCIUM: 7.7 mg/dL — AB (ref 8.9–10.3)
CO2: 28 mmol/L (ref 22–32)
CREATININE: 3.12 mg/dL — AB (ref 0.61–1.24)
Chloride: 96 mmol/L — ABNORMAL LOW (ref 101–111)
GFR calc Af Amer: 21 mL/min — ABNORMAL LOW (ref >60)
GFR calc non Af Amer: 18 mL/min — ABNORMAL LOW (ref >60)
GLUCOSE: 159 mg/dL — AB (ref 65–99)
Phosphorus: 2.6 mg/dL (ref 2.5–4.6)
Potassium: 4.4 mmol/L (ref 3.5–5.1)
SODIUM: 133 mmol/L — AB (ref 135–145)

## 2016-04-04 LAB — CBC
HCT: 29 % — ABNORMAL LOW (ref 39.0–52.0)
HCT: 29.1 % — ABNORMAL LOW (ref 39.0–52.0)
HEMOGLOBIN: 9.5 g/dL — AB (ref 13.0–17.0)
Hemoglobin: 9.3 g/dL — ABNORMAL LOW (ref 13.0–17.0)
MCH: 29.4 pg (ref 26.0–34.0)
MCH: 28.8 pg (ref 26.0–34.0)
MCHC: 32.8 g/dL (ref 30.0–36.0)
MCHC: 32 g/dL (ref 30.0–36.0)
MCV: 89.8 fL (ref 78.0–100.0)
MCV: 90.1 fL (ref 78.0–100.0)
Platelets: 112 10*3/uL — ABNORMAL LOW (ref 150–400)
Platelets: 92 10*3/uL — ABNORMAL LOW (ref 150–400)
RBC: 3.23 MIL/uL — AB (ref 4.22–5.81)
RBC: 3.23 MIL/uL — ABNORMAL LOW (ref 4.22–5.81)
RDW: 18.9 % — ABNORMAL HIGH (ref 11.5–15.5)
RDW: 18.8 % — ABNORMAL HIGH (ref 11.5–15.5)
WBC: 4.2 10*3/uL (ref 4.0–10.5)
WBC: 3.9 10*3/uL — ABNORMAL LOW (ref 4.0–10.5)

## 2016-04-04 MED ORDER — ALBUMIN HUMAN 25 % IV SOLN
25.0000 g | Freq: Once | INTRAVENOUS | Status: DC
  Filled 2016-04-04: qty 100

## 2016-04-04 MED ORDER — OXYCODONE HCL 5 MG PO TABS
ORAL_TABLET | ORAL | Status: AC
  Filled 2016-04-04: qty 1

## 2016-04-04 MED ORDER — SODIUM CHLORIDE 0.9 % IV SOLN: 100.0000 mL | INTRAVENOUS | Status: DC | PRN

## 2016-04-04 MED ORDER — LIDOCAINE HCL (PF) 1 % IJ SOLN: 5.0000 mL | INTRAMUSCULAR | Status: DC | PRN

## 2016-04-04 MED ORDER — PENTAFLUOROPROP-TETRAFLUOROETH EX AERO: 1.0000 "application " | INHALATION_SPRAY | CUTANEOUS | Status: DC | PRN

## 2016-04-04 MED ORDER — ALTEPLASE 2 MG IJ SOLR: 2.0000 mg | Freq: Once | INTRAMUSCULAR | Status: DC | PRN

## 2016-04-04 MED ORDER — HEPARIN SODIUM (PORCINE) 1000 UNIT/ML DIALYSIS: 1000.0000 [IU] | INTRAMUSCULAR | Status: DC | PRN

## 2016-04-04 MED ORDER — LIDOCAINE-PRILOCAINE 2.5-2.5 % EX CREA: 1.0000 "application " | TOPICAL_CREAM | CUTANEOUS | Status: DC | PRN

## 2016-04-04 MED ORDER — LISINOPRIL 10 MG PO TABS
10.0000 mg | ORAL_TABLET | Freq: Every day | ORAL | Status: DC
  Administered 2016-04-04 – 2016-04-10 (×7): 10 mg via ORAL
  Filled 2016-04-04 (×10): qty 1

## 2016-04-04 MED ORDER — METOPROLOL SUCCINATE ER 25 MG PO TB24
25.0000 mg | ORAL_TABLET | Freq: Every day | ORAL | Status: DC
  Administered 2016-04-04 – 2016-04-10 (×7): 25 mg via ORAL
  Filled 2016-04-04 (×10): qty 1

## 2016-04-04 MED ORDER — ALBUMIN HUMAN 25 % IV SOLN
INTRAVENOUS | Status: AC
  Administered 2016-04-04: 12.5 g
  Filled 2016-04-04: qty 100

## 2016-04-04 MED ORDER — HEPARIN SODIUM (PORCINE) 1000 UNIT/ML DIALYSIS: 40.0000 [IU]/kg | INTRAMUSCULAR | Status: DC | PRN

## 2016-04-04 NOTE — Progress Notes (Signed)
PROGRESS NOTE  Jeffrey Walls WUJ:[id number] DOB: Aug 04, 1941 DOA: 03/30/2016 PCP: Theressa StampsHADLEY,ALEXANDER, MD  HPI/Recap of past 3124 hours:  75 year old male with past mental history of diabetes, hypertension and end-stage renal disease who was recently discharged from skilled nursing facility for rehabilitation and had been having increased weakness as well as came in following a syncopal episode. This has been the second one since patient finished inpatient rehabilitation. The emergency room, labs were unrevealing except for mildly elevated troponin, although this is in the setting of end-stage renal disease. Patient has had no symptoms of chest pain.  Patient doing okay. His description of lightheadedness and weakness seems related to dyspnea on exertion. Less likely orthostatic. When patient complained more dyspnea on exertion in relation to his syncope, chest CT check noted bilateral pleural effusions and interstitial edema. Echocardiogram done noted ejection fraction of 40-45% plus grade 2 diastolic dysfunction plus moderate aortic regurg. Patient started on IV Lasix on the evening of 4/18.  Patient started breathing easier afterwards and was able to lay more on his back without getting dyspneic.  Had dialysis on night of 4/19, patient suddenly started having chills. Low-grade temperature 100. Nephrology started antibiotics after getting blood cultures which are now growing out 4/4 bottles of coag-negative staph. Infectious disease consulted.   Patient today doing okay, breathing easier.  Assessment/Plan: Principal Problem: Staph bacteremia: Not contaminant. Discussed with infectious disease will formally consult. Discussed with nephrology. Suspected line infection. Other sources of infection: Pneumonia and urine ruled out. Nephrology plans to do dialysis today and then pull line and will discuss with vascular surgery about a new line access.     Syncope secondary to acute systolic/diastolic heart  failure: Felt to be secondary to sudden hypoxia from acute congestive heart failure. Echocardiogram confirms this. Orthostatics have been unrevealing. Since he still makes urine, trying to manage initially with Lasix and now dialysis. Chest x-ray done 4/20 nodes improvement. Some component of his syncope may also be from this bacteremia. Checking ambulatory pulse ox for oxygen saturations levels Active Problems:   ESRD on hemodialysis [facility](HCC): Nephrology managing, next [facility] for today   Anemia of renal disease: Management as per nephrology    Frequent falls: Likely secondary to hypoxia from volume overload   Essential hypertension: Antihypertensives initially held secondary to concerns for orthostatic hypotension causing syncope, restarted 4/19. Blood pressures improving    Diabetes mellitus with diabetic nephropathy, with long-term current use of insulin (HCC): Sliding scale. A1c low at 5.5. Concerned about episodes of hypoglycemia. Monitoring CBGs   Elevated troponin: Stable, likely secondary to renal disease. No evidence of ACS  Code Status: Full code  Family Communication: Updated daughter by phone today  Disposition Plan: Discharged to skilled nursing sometime next week  Consultants:  Nephrology  Procedures:  Dialysis Monday Wednesday Friday   Echocardiogram done 4/18: Moderate aortic regurg, ejection fraction 40-45 percent, grade 2 diastolic dysfunction  Antibiotics: IV Elita QuickFortaz 4/19-present  IV vancomycin4/19-present   Objective: BP 153/60 mmHg  Pulse 69  Temp(Src) 97.2 F (36.2 C) (Oral)  Resp 16  Ht 5\' 9"  (1.753 m)  Wt 53.6 kg (118 lb 2.7 oz)  BMI 17.44 kg/m2  SpO2 99%  Intake/Output Summary (Last 24 hours) at 04/04/16 1313 Last data filed at 04/04/16 1137  Gross per 24 hour  Intake    840 ml  Output    332 ml  Net    508 ml   Filed Weights   04/03/16 1949 04/04/16 0305 04/04/16 0637  Weight: 57.6  kg (126 lb 15.8 oz) 56 kg (123 lb 7.3 oz) 53.6 kg (118 lb  2.7 oz)    Exam:   General:  Alert and oriented 3 , Comfortable   Cardiovascular: Regular rate and rhythm, S1-S2   Respiratory: Clear to auscultation bilaterally decreased at bases  Abdomen: Soft, nontender, nondistended, positive bowel sounds  Musculoskeletal: No clubbing or cyanosis, trace edema \  Data Reviewed: Basic Metabolic Panel:  Recent Labs Lab 03/30/16 1447 03/30/16 1649 03/31/16 0452 04/01/16 0559 04/02/16 1350 04/04/16 0703  NA 137  --  138 135 135 133*  K 3.1*  --  2.9* 4.4 5.9* 4.4  CL 97*  --  97* 99* 101 96*  CO2 28  --  GLUCOSE 105*  --  115* 149* 134* 159*  BUN 12  --  CREATININE 3.24* 3.42* 3.72* 2.01* 3.14* 3.12*  CALCIUM 8.1*  --  8.0* 7.4* 8.1* 7.7*  MG  --  1.7  --   --   --   --   PHOS  --  3.1  --   --   --  2.6   Liver Function Tests:  Recent Labs Lab 03/30/16 1447 04/04/16 0703  AST 26  --   ALT 19  --   ALKPHOS 102  --   BILITOT 1.2  --   PROT 4.3*  --   ALBUMIN 2.3* 2.0*   No results for input(s): LIPASE, AMYLASE in the last 168 hours. No results for input(s): AMMONIA in the last 168 hours. CBC:  Recent Labs Lab 03/30/16 1447 03/30/16 1649 03/31/16 0452 04/04/16 0555  WBC 8.1 7.6 6.4 4.2  NEUTROABS 6.3  --   --   --   HGB 8.8* 9.0* 9.0* 9.5*  HCT 27.7* 27.8* 28.0* 29.0*  MCV 87.7 88.3 87.5 89.8  PLT 185 182 180 112*   Cardiac Enzymes:    Recent Labs Lab 03/30/16 2138 03/31/16 0452  TROPONINI 0.51* 0.49*   BNP (last 3 results)  Recent Labs  02/28/16 2200  BNP >4500.0*    ProBNP (last 3 results) No results for input(s): PROBNP in the last 8760 hours.  CBG:  Recent Labs Lab 04/03/16 1220 04/03/16 1619 04/03/16 1951 04/04/16 0531 04/04/16 1152  GLUCAP 149* 155* 138* 157* 101*    Recent Results (from the past 240 hour(s))  MRSA PCR Screening     Status: None   Collection Time: 03/30/16  6:37 PM  Result Value Ref Range Status   MRSA by PCR NEGATIVE NEGATIVE Final     Comment:        The GeneXpert MRSA Assay (FDA approved for NASAL specimens only), is one component of a comprehensive MRSA colonization surveillance program. It is not intended to diagnose MRSA infection nor to guide or monitor treatment for MRSA infections.   Urine culture     Status: None   Collection Time: 03/31/16  8:58 AM  Result Value Ref Range Status   Specimen Description URINE, CLEAN CATCH  Final   Special Requests NONE  Final   Culture MULTIPLE SPECIES PRESENT, SUGGEST RECOLLECTION  Final   Report Status 04/01/2016 FINAL  Final  Culture, blood (routine x 2)     Status: Abnormal (Preliminary result)   Collection Time: 04/02/16  4:05 PM  Result Value Ref Range Status   Specimen Description BLOOD HEMODIALYSIS LINE  Final   Special Requests BOTTLES DRAWN AEROBIC AND ANAEROBIC 10CC  Final   Culture  Setup Time   Final    GRAM POSITIVE COCCI IN CLUSTERS IN BOTH AEROBIC AND ANAEROBIC BOTTLES CRITICAL RESULT CALLED TO, READ BACK BY AND VERIFIED WITH: Patsey Berthold RN 13:15 04/03/16 ([email or web address])    Culture (A)  Final    STAPHYLOCOCCUS SPECIES (COAGULASE NEGATIVE) SUSCEPTIBILITIES TO FOLLOW    Report Status PENDING  Incomplete  Culture, blood (routine x 2)     Status: Abnormal (Preliminary result)   Collection Time: 04/02/16  4:35 PM  Result Value Ref Range Status   Specimen Description BLOOD RIGHT ARM  Final   Special Requests   Final    BOTTLES DRAWN AEROBIC AND ANAEROBIC 4CC ANA 5CC AER   Culture  Setup Time   Final    GRAM POSITIVE COCCI IN CLUSTERS IN BOTH AEROBIC AND ANAEROBIC BOTTLES CRITICAL RESULT CALLED TO, READ BACK BY AND VERIFIED WITH: Patsey Berthold RN 16:45 04/03/16 ([email or web address])    Culture (A)  Final    STAPHYLOCOCCUS SPECIES (COAGULASE NEGATIVE) SUSCEPTIBILITIES TO FOLLOW    Report Status PENDING  Incomplete     Studies: No results found.  Scheduled Meds: . albumin human  25 g Intravenous Once  . aspirin EC  81 mg Oral Daily  . citalopram  20 mg Oral  Daily  . darbepoetin (ARANESP) injection - DIALYSIS  60 mcg Intravenous Q Mon-HD  . enoxaparin (LOVENOX) injection  30 mg Subcutaneous Q24H  . feeding supplement  1 Container Oral BID BM  . feeding supplement (PRO-STAT SUGAR FREE 64)  30 mL Oral Q2000  . ferric gluconate (FERRLECIT/NULECIT) IV  125 mg Intravenous Q M,W,F-HD  . fludrocortisone  0.1 mg Oral Daily  . folic acid  1 mg Oral Daily  . insulin aspart  0-9 Units Subcutaneous TID WC  . lisinopril  10 mg Oral Daily  . metoprolol succinate  25 mg Oral Daily  . oxyCODONE      . pantoprazole  20 mg Oral Daily  . sodium chloride flush  3 mL Intravenous Q12H  . vancomycin  500 mg Intravenous Q M,W,F-HD    Continuous Infusions:    Time spent: 25 minutes  Hollice Espy  [facility] Pager [phone] . If 7PM-7AM, please contact night-coverage at [email or web address], password [facility] 04/04/2016, 1:13 PM  LOS: 2 days

## 2016-04-04 NOTE — Progress Notes (Signed)
Pharmacy Antibiotic Note  Jeffrey Walls is a 75 y.o. male admitted on 03/30/2016 with syncope.  Pharmacy has been consulted for vancomycin and ceftazidime dosing for fever, chills and now with GPC clusters in blood culture.  Plan: Continue vancomycin 500 mg IV qHD on MWF Continue ceftazidime 2 g IV after HD on MWF F/u culture results and narrow if able  Height: 5\' 9"  (175.3 cm) Weight: 118 lb 2.7 oz (53.6 kg) IBW/kg (Calculated) : 70.7  Temp (24hrs), Avg:97.6 F (36.4 C), Min:97.5 F (36.4 C), Max:97.8 F (36.6 C)   Recent Labs Lab 03/30/16 1447 03/30/16 1503 03/30/16 1649 03/30/16 1703 03/31/16 0452 04/01/16 0559 04/02/16 1350 04/04/16 0555 04/04/16 0703  WBC 8.1  --  7.6  --  6.4  --   --  4.2  --   CREATININE 3.24*  --  3.42*  --  3.72* 2.01* 3.14*  --  3.12*  LATICACIDVEN  --  0.80  --  0.79  --   --   --   --   --     Estimated Creatinine Clearance: 16 mL/min (by C-G formula based on Cr of 3.12).    Allergies  Allergen Reactions  . Ampicillin Rash    Ankle swelling  . Benadryl [Diphenhydramine Hcl] Itching    Rash    Antimicrobials this admission: Vanc 4/19 >>  Elita QuickFortaz 4/19 >>  Dose adjustments this admission:   Microbiology results: 4/19 BCx: 2/2 GPC  4/19 UCx: multiple species  4/16 MRSA screen neg   Thank you for allowing pharmacy to be a part of this patient's care.  Fox ChaseJennifer Sibley, 17[address] Rainbow BoulevardPharm.D., BCPS Clinical Pharmacist Pager: [phone] 04/04/2016 11:07 AM

## 2016-04-04 NOTE — Clinical Social Work Note (Signed)
CSW monitor patient's progress and readiness for discharge. Per MD note, [name] will be discharged sometime next week. CSW will continue to follow and [facility].  Genelle BalVanessa Gayle Martinez, MSW, LCSW Licensed Clinical Social Worker Clinical Social Work Department [facility] [phone]
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
## 2016-04-04 NOTE — Consult Note (Signed)
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  Consider MRI of spine Pull his HD line (as you are)  Comment- his TTE result is very concerning for IE. Would investigate further with TEE.   Thank you so much for this interesting consult,   Bobby Rumpf (pager) [phone] [email or web address]  Jeffrey Walls is an 75 y.o. male.  HPI: 75 yo M with hx of DM2, ANCA vasculitis, ESRD comes to hospital with hx of falls. He complains of back pain since December due to this. He had syncopal episode on day of admission. He was afebrile and had nl WBC. Plain films did not show acute findings in his spine. He was felt to have worsening of his CHF. He was found on CT to have lg R effusion.  He then dveloped temps around 100 and had BCx done. He was started on vanco/ceftaz. He is now found to have 4/4 BCx+ for Coag Neg Staph.  States he has been having problems with HD line- no d/c, no erythema, "they said it doesn't look good"  Past Medical History  Diagnosis Date  . Renal disorder   . Hypertension   . Diabetes mellitus without complication ([place])   . Coronary artery disease   . Shortness of breath dyspnea   . 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 Av fistula placement Left 03/04/2016    Procedure: ARTERIOVENOUS (AV) FISTULA CREATION LEFT RADIOCEPHALIC;  Surgeon: Angelia Mould, MD;  Location: [place];  Service: Vascular;  Laterality: Left;     Allergies  Allergen Reactions  . Ampicillin Rash    Ankle swelling  . Benadryl [Diphenhydramine Hcl] Itching    Rash     Medications:  Scheduled: . albumin human  25 g Intravenous Once  . aspirin EC  81 mg Oral Daily  . citalopram  20 mg Oral Daily  . darbepoetin (ARANESP) injection - DIALYSIS  60 mcg Intravenous Q Mon-HD  . enoxaparin (LOVENOX) injection  30 mg Subcutaneous Q24H  . feeding supplement  1 Container Oral BID BM  . feeding supplement (PRO-STAT SUGAR FREE 64)  30 mL Oral Q2000  . ferric gluconate (FERRLECIT/NULECIT) IV  125 mg Intravenous Q M,W,F-HD  . fludrocortisone  0.1 mg Oral Daily  . folic acid  1 mg Oral Daily  . insulin aspart  0-9 Units Subcutaneous TID WC  . lisinopril  10 mg Oral Daily  . metoprolol succinate  25 mg Oral Daily  . oxyCODONE      . pantoprazole  20 mg Oral Daily  . sodium chloride flush  3 mL Intravenous Q12H  . vancomycin  500 mg Intravenous Q M,W,F-HD    Abtx:  Anti-infectives    Start     Dose/Rate Route Frequency Ordered Stop   04/04/16 1200  vancomycin (VANCOCIN) 500 mg in sodium chloride 0.9 % 100 mL IVPB     500 mg 100 mL/hr over 60 Minutes Intravenous Every M-W-F (Hemodialysis) 04/02/16 1630     04/02/16 1800  cefTAZidime (FORTAZ) 2 g in dextrose 5 % 50 mL IVPB  Status:  Discontinued  2 g 100 mL/hr over 30 Minutes Intravenous Every M-W-F (1800) 04/02/16 1624 04/04/16 1148   04/02/16 1630  vancomycin (VANCOCIN) IVPB 1000 mg/200 mL premix     1,000 mg 200 mL/hr over 60 Minutes Intravenous  Once 04/02/16 1623 04/02/16 1740      Total days of antibiotics: 2 vanco          Social History:  reports that he has never smoked. He does not have any smokeless tobacco history on file. He reports that he does not drink alcohol or use illicit drugs.  History reviewed. No pertinent family history.  General ROS: normal BM, makes urine, see HPI.   Blood pressure 153/60, pulse 69, temperature 97.2 F (36.2 C), temperature source Oral, resp. rate 16, height 5' 9"  (1.753 m), weight 53.6 kg (118 lb 2.7 oz), SpO2 99 %. General appearance: alert,  cooperative, no distress and pale Eyes: negative findings: conjunctivae and sclerae normal and pupils equal, round, reactive to light and accomodation Throat: normal findings: oropharynx pink & moist without lesions or evidence of thrush Neck: no adenopathy and supple, symmetrical, trachea midline Lungs: clear to auscultation bilaterally Chest wall: no tenderness, HD line clean. dressed.  Heart: regular rate and rhythm Abdomen: normal findings: bowel sounds normal and soft, non-tender Extremities: LUE AVF- + bruit.    Results for orders placed or performed during the hospital encounter of 03/30/16 (from the past 48 hour(s))  Glucose, capillary     Status: Abnormal   Collection Time: 04/02/16  3:46 PM  Result Value Ref Range   Glucose-Capillary 107 (H) 65 - 99 mg/dL  Culture, blood (routine x 2)     Status: Abnormal (Preliminary result)   Collection Time: 04/02/16  4:05 PM  Result Value Ref Range   Specimen Description BLOOD HEMODIALYSIS LINE    Special Requests BOTTLES DRAWN AEROBIC AND ANAEROBIC 10CC    Culture  Setup Time      GRAM POSITIVE COCCI IN CLUSTERS IN BOTH AEROBIC AND ANAEROBIC BOTTLES CRITICAL RESULT CALLED TO, READ BACK BY AND VERIFIED WITH: Dwaine Gale RN 13:15 04/03/16 ([email or web address])    Culture (A)     STAPHYLOCOCCUS SPECIES (COAGULASE NEGATIVE) SUSCEPTIBILITIES TO FOLLOW    Report Status PENDING   Culture, blood (routine x 2)     Status: Abnormal (Preliminary result)   Collection Time: 04/02/16  4:35 PM  Result Value Ref Range   Specimen Description BLOOD RIGHT ARM    Special Requests      BOTTLES DRAWN AEROBIC AND ANAEROBIC 4CC ANA 5CC AER   Culture  Setup Time      GRAM POSITIVE COCCI IN CLUSTERS IN BOTH AEROBIC AND ANAEROBIC BOTTLES CRITICAL RESULT CALLED TO, READ BACK BY AND VERIFIED WITH: Dwaine Gale RN 16:45 04/03/16 ([email or web address])    Culture (A)     STAPHYLOCOCCUS SPECIES (COAGULASE NEGATIVE) SUSCEPTIBILITIES TO FOLLOW    Report Status PENDING   Glucose,  capillary     Status: Abnormal   Collection Time: 04/02/16  6:36 PM  Result Value Ref Range   Glucose-Capillary 137 (H) 65 - 99 mg/dL  Glucose, capillary     Status: Abnormal   Collection Time: 04/02/16  8:19 PM  Result Value Ref Range   Glucose-Capillary 169 (H) 65 - 99 mg/dL  Glucose, capillary     Status: Abnormal   Collection Time: 04/03/16  4:46 AM  Result Value Ref Range   Glucose-Capillary 124 (H) 65 - 99 mg/dL  Glucose, capillary     Status: Abnormal  Collection Time: 04/03/16  7:31 AM  Result Value Ref Range   Glucose-Capillary 103 (H) 65 - 99 mg/dL  Glucose, capillary     Status: Abnormal   Collection Time: 04/03/16 12:20 PM  Result Value Ref Range   Glucose-Capillary 149 (H) 65 - 99 mg/dL  Glucose, capillary     Status: Abnormal   Collection Time: 04/03/16  4:19 PM  Result Value Ref Range   Glucose-Capillary 155 (H) 65 - 99 mg/dL   Comment 1 Notify RN   Glucose, capillary     Status: Abnormal   Collection Time: 04/03/16  7:51 PM  Result Value Ref Range   Glucose-Capillary 138 (H) 65 - 99 mg/dL  Glucose, capillary     Status: Abnormal   Collection Time: 04/04/16  5:31 AM  Result Value Ref Range   Glucose-Capillary 157 (H) 65 - 99 mg/dL   Comment 1 Notify RN    Comment 2 Document in Chart   CBC     Status: Abnormal   Collection Time: 04/04/16  5:55 AM  Result Value Ref Range   WBC 4.2 4.0 - 10.5 K/uL   RBC 3.23 (L) 4.22 - 5.81 MIL/uL   Hemoglobin 9.5 (L) 13.0 - 17.0 g/dL   HCT 29.0 (L) 39.0 - 52.0 %   MCV 89.8 78.0 - 100.0 fL   MCH 29.4 26.0 - 34.0 pg   MCHC 32.8 30.0 - 36.0 g/dL   RDW 18.9 (H) 11.5 - 15.5 %   Platelets 112 (L) 150 - 400 K/uL    Comment: SPECIMEN CHECKED FOR CLOTS REPEATED TO VERIFY PLATELET COUNT CONFIRMED BY SMEAR   Renal function panel     Status: Abnormal   Collection Time: 04/04/16  7:03 AM  Result Value Ref Range   Sodium 133 (L) 135 - 145 mmol/L   Potassium 4.4 3.5 - 5.1 mmol/L   Chloride 96 (L) 101 - 111 mmol/L   CO2 28 22 -  32 mmol/L   Glucose, Bld 159 (H) 65 - 99 mg/dL   BUN 19 6 - 20 mg/dL   Creatinine, Ser 3.12 (H) 0.61 - 1.24 mg/dL   Calcium 7.7 (L) 8.9 - 10.3 mg/dL   Phosphorus 2.6 2.5 - 4.6 mg/dL   Albumin 2.0 (L) 3.5 - 5.0 g/dL   GFR calc non Af Amer 18 (L) >60 mL/min   GFR calc Af Amer 21 (L) >60 mL/min    Comment: (NOTE) The eGFR has been calculated using the CKD EPI equation. This calculation has not been validated in all clinical situations. eGFR's persistently <60 mL/min signify possible Chronic Kidney Disease.    Anion gap 9 5 - 15  Glucose, capillary     Status: Abnormal   Collection Time: 04/04/16 11:52 AM  Result Value Ref Range   Glucose-Capillary 101 (H) 65 - 99 mg/dL  CBC     Status: Abnormal   Collection Time: 04/04/16 12:17 PM  Result Value Ref Range   WBC 3.9 (L) 4.0 - 10.5 K/uL   RBC 3.23 (L) 4.22 - 5.81 MIL/uL   Hemoglobin 9.3 (L) 13.0 - 17.0 g/dL   HCT 29.1 (L) 39.0 - 52.0 %   MCV 90.1 78.0 - 100.0 fL   MCH 28.8 26.0 - 34.0 pg   MCHC 32.0 30.0 - 36.0 g/dL   RDW 18.8 (H) 11.5 - 15.5 %   Platelets 92 (L) 150 - 400 K/uL    Comment: CONSISTENT WITH PREVIOUS RESULT      Component Value Date/Time  SDES BLOOD RIGHT ARM 04/02/2016 1635   SPECREQUEST  04/02/2016 1635    BOTTLES DRAWN AEROBIC AND ANAEROBIC 4CC ANA 5CC AER   CULT * 04/02/2016 1635    STAPHYLOCOCCUS SPECIES (COAGULASE NEGATIVE) SUSCEPTIBILITIES TO FOLLOW    REPTSTATUS PENDING 04/02/2016 1635   Dg Chest 2 View  04/03/2016  CLINICAL DATA:  Short of breath today.  Pleural effusions. EXAM: CHEST  2 VIEW COMPARISON:  Chest CT, 04/01/2016.  Chest radiograph, 03/30/2016. FINDINGS: Cardiac silhouette is normal in size and configuration. No mediastinal or hilar masses or pathologically enlarged lymph nodes. Right internal jugular dual-lumen tunneled central venous catheter has its tip projecting in the right atrium. This is stable. There are small, right greater than left, pleural effusions. These appear smaller than  noted on the recent prior CT. There is no convincing pneumonia or pulmonary edema. No pneumothorax. IMPRESSION: 1. Small, right greater left, pleural effusions which appears smaller than they were on the recent prior chest CT. 2. No evidence of pneumonia or pulmonary edema. Electronically Signed   By: Lajean Manes M.D.   On: 04/03/2016 12:44   Recent Results (from the past 240 hour(s))  MRSA PCR Screening     Status: None   Collection Time: 03/30/16  6:37 PM  Result Value Ref Range Status   MRSA by PCR NEGATIVE NEGATIVE Final    Comment:        The GeneXpert MRSA Assay (FDA approved for NASAL specimens only), is one component of a comprehensive MRSA colonization surveillance program. It is not intended to diagnose MRSA infection nor to guide or monitor treatment for MRSA infections.   Urine culture     Status: None   Collection Time: 03/31/16  8:58 AM  Result Value Ref Range Status   Specimen Description URINE, CLEAN CATCH  Final   Special Requests NONE  Final   Culture MULTIPLE SPECIES PRESENT, SUGGEST RECOLLECTION  Final   Report Status 04/01/2016 FINAL  Final  Culture, blood (routine x 2)     Status: Abnormal (Preliminary result)   Collection Time: 04/02/16  4:05 PM  Result Value Ref Range Status   Specimen Description BLOOD HEMODIALYSIS LINE  Final   Special Requests BOTTLES DRAWN AEROBIC AND ANAEROBIC 10CC  Final   Culture  Setup Time   Final    GRAM POSITIVE COCCI IN CLUSTERS IN BOTH AEROBIC AND ANAEROBIC BOTTLES CRITICAL RESULT CALLED TO, READ BACK BY AND VERIFIED WITH: Dwaine Gale RN 13:15 04/03/16 ([email or web address])    Culture (A)  Final    STAPHYLOCOCCUS SPECIES (COAGULASE NEGATIVE) SUSCEPTIBILITIES TO FOLLOW    Report Status PENDING  Incomplete  Culture, blood (routine x 2)     Status: Abnormal (Preliminary result)   Collection Time: 04/02/16  4:35 PM  Result Value Ref Range Status   Specimen Description BLOOD RIGHT ARM  Final   Special Requests   Final    BOTTLES  DRAWN AEROBIC AND ANAEROBIC 4CC ANA 5CC AER   Culture  Setup Time   Final    GRAM POSITIVE COCCI IN CLUSTERS IN BOTH AEROBIC AND ANAEROBIC BOTTLES CRITICAL RESULT CALLED TO, READ BACK BY AND VERIFIED WITH: Dwaine Gale RN 16:45 04/03/16 ([email or web address])    Culture (A)  Final    STAPHYLOCOCCUS SPECIES (COAGULASE NEGATIVE) SUSCEPTIBILITIES TO FOLLOW    Report Status PENDING  Incomplete      04/04/2016, 2:45 PM     LOS: 2 days    Records and images were personally reviewed where available.

## 2016-04-04 NOTE — Progress Notes (Signed)
[facility] [place]  HPI: 75 y/o male was seen by Dr. Edilia Boickson on 03/04/2016 for creation of left radial cephalic fistula.   He currently dialyzes via a right IJ perm catheter, which was placed in the [facility] system.  He has Gram+ cocci bacteremia- afebrile - catheter likely source- on [place] and Fortaz pending final results per nephrology note.  We have been asked to remove the catheter and wait 2-3 days prior to placement of new catheter.  He is not on any anticoagulation medications.   Objective 153/60 69 97.2 F (36.2 C) (Oral) 16 99%  Intake/Output Summary (Last 24 hours) at 04/04/16 1227 Last data filed at 04/04/16 1137  Gross per 24 hour  Intake    915 ml  Output    382 ml  Net    533 ml    Palpable thrill left forearm av fistula Grip 5/5, sensation intact, no muscle waisting of left UE Heart RRR Lungs non labored breathing  Assessment/Planning: Right IJ infected HD catheter Removal of catheter  Plan new catheter 2-3 days    Clinton GallantCOLLINS, Freya Zobrist [facility] 04/04/2016 12:27 PM --  Laboratory Lab Results:  Recent Labs  04/04/16 0555  WBC 4.2  HGB 9.5*  HCT 29.0*  PLT 112*   BMET  Recent Labs  04/02/16 1350 04/04/16 0703  NA 135 133*  K 5.9* 4.4  CL 101 96*  CO2 28 28  GLUCOSE 134* 159*  BUN 19 19  CREATININE 3.14* 3.12*  CALCIUM 8.1* 7.7*    COAG Lab Results  Component Value Date   INR 1.27 03/30/2016   INR 1.29 12/25/2015   No results found for: PTT

## 2016-04-04 NOTE — Progress Notes (Signed)
Report given for hemo this am.

## 2016-04-04 NOTE — Clinical Documentation Improvement (Signed)
Infectious Disease Internal Medicine  Please clarify what "Staph bacteremia: Not contaminant; suspected line infection" means and document findings in next progress note.   Staph Bacteremia: Not contaminant as documented  Staph Septicemia due to line sepsis - present or evolving on admission; acquired after admission  The words Bacteremia and Septicemia are sometimes used interchangeably. However, coding rules and guidelines are very specific and we want to capture the most accurate diagnosis; hence query.  Other  Clinically Undetermined  Supporting Information:  Pneumonia and UTI have been ruled out  Please exercise your independent, professional judgment when responding. A specific answer is not anticipated or expected.  Thank You, Shellee MiloEileen T Yalonda Sample RN, BSN, CCDS Health Information Management [place] [phone]; Cell: [phone]

## 2016-04-04 NOTE — Progress Notes (Signed)
[facility] Catheter Removal Procedure Note  Diagnosis: ESRD with Functioning AVF/AVGG  Plan:  Remove right diatek catheter  Consent signed:  yes Time out completed:  yes Coumadin:  No. PT/INR (if applicable):   Other labs:   Procedure: 1.  Sterile prepping and draping over catheter area 2. 3 ml 2% lidocaine plain instilled at removal site. 3.  right catheter removed in its entirety with cuff in tact. 4.  Complications: none  5. Tip of catheter sent for culture:  Yes.     Patient tolerated procedure well:  yes Pressure held, no bleeding noted, dressing applied Instructions given to the pt regarding wound care and bleeding.  OtherClinton Gallant:  Anyssa Sharpless [facility] 04/04/2016 5:03 PM

## 2016-04-05 ENCOUNTER — Inpatient Hospital Stay (HOSPITAL_COMMUNITY): Payer: Medicare HMO

## 2016-04-05 DIAGNOSIS — Z794 Long term (current) use of insulin: Secondary | ICD-10-CM

## 2016-04-05 DIAGNOSIS — I5041 Acute combined systolic (congestive) and diastolic (congestive) heart failure: Secondary | ICD-10-CM

## 2016-04-05 DIAGNOSIS — R7881 Bacteremia: Secondary | ICD-10-CM

## 2016-04-05 DIAGNOSIS — D631 Anemia in chronic kidney disease: Secondary | ICD-10-CM

## 2016-04-05 DIAGNOSIS — M549 Dorsalgia, unspecified: Secondary | ICD-10-CM

## 2016-04-05 DIAGNOSIS — E1121 Type 2 diabetes mellitus with diabetic nephropathy: Secondary | ICD-10-CM

## 2016-04-05 LAB — GLUCOSE, CAPILLARY
GLUCOSE-CAPILLARY: 91 mg/dL (ref 65–99)
Glucose-Capillary: 96 mg/dL (ref 65–99)
Glucose-Capillary: 112 mg/dL — ABNORMAL HIGH (ref 65–99)
Glucose-Capillary: 127 mg/dL — ABNORMAL HIGH (ref 65–99)

## 2016-04-05 LAB — CULTURE, BLOOD (ROUTINE X 2)

## 2016-04-05 MED ORDER — ALPRAZOLAM 0.25 MG PO TABS
0.2500 mg | ORAL_TABLET | Freq: Three times a day (TID) | ORAL | Status: DC | PRN
  Administered 2016-04-05 – 2016-04-10 (×6): 0.25 mg via ORAL
  Filled 2016-04-05 (×7): qty 1

## 2016-04-05 NOTE — Consult Note (Signed)
[facility] [place]  Subjective  - feels ok   Objective 144/58 62 98 F (36.7 C) (Oral) 18 100%  Intake/Output Summary (Last 24 hours) at 04/05/16 1436 Last data filed at 04/05/16 1100  Gross per 24 hour  Intake   2880 ml  Output      0 ml  Net   2880 ml   Right neck no hematoma + thrill left arm av fistula  Assessment/Planning: Plan to replace dialysis catheter in morning Procedure risk benefit discussed with pt NPO p midnight  Anju Sereno 04/05/2016 2:36 PM --  Laboratory Lab Results:  Recent Labs  04/04/16 0555 04/04/16 1217  WBC 4.2 3.9*  HGB 9.5* 9.3*  HCT 29.0* 29.1*  PLT 112* 92*   BMET  Recent Labs  04/04/16 0703  NA 133*  K 4.4  CL 96*  CO2 28  GLUCOSE 159*  BUN 19  CREATININE 3.12*  CALCIUM 7.7*    COAG Lab Results  Component Value Date   INR 1.27 03/30/2016   INR 1.29 12/25/2015   No results found for: PTT      

## 2016-04-05 NOTE — Progress Notes (Signed)
Subjective:  Cos cramps on Hd yest. Jearld Fenton/permcath removed   Objective Vital signs in last 24 hours: Filed Vitals:   04/04/16 1119 04/04/16 1921 04/04/16 2134 04/05/16 0600  BP: 153/60 115/43 143/65 123/51  Pulse: 69 59 65 64  Temp: 97.2 F (36.2 C) 97.9 F (36.6 C) 97.3 F (36.3 C) 97.7 F (36.5 C)  TempSrc: Oral Oral Oral Oral  Resp: 16 16 17 18   Height:      Weight:    53.7 kg (118 lb 6.2 oz)  SpO2: 99% 97% 100% 99%   Weight change: -3.9 kg (-8 lb 9.6 oz) Physical Exam General:Alert , NAD thin chronically ill W elderly M Heart: RRR no rub Lungs: CTA  Abdomen: soft NT/ ND Extremities: no  pedal edema Dialysis Access: LUA AVF pos bruit /  (maturing)/ R IJ cath removed dressing dry /clean  OP Dialysis Orders: MWF [facility] 56kg-leaving below last two tmt 2/2.25 bath Hep none LUA AVF (maturing)/ R IJ cath Mircera 30 ug every 2, last 4/12 Hep lock for cath tfs 27%, ferr 2787 Hb 10.5 pth 115 Ca/P ok  Problem/Plan: 1. Syncopal episode - Multiple etiologies= sepsis/ vol  Overload with BP drop with hd uf with bp meds on board/ taper down bp meds/ sepsis wu rx  / less dizzy  Walker in room but feels too weak to walk today  2.pul edema/pleural effusions -  UF 3.2 4/19 with post weight down 53.6  at same weight YESTERDAY  BP had rapid drop to 70s on hd  ; 25 gm albumin given - and cxr  better with vol us no pul edema 3. ESRD MWF -- no heparin due to hx SDH  4. 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 Nutrition alb 2.0-  losing weight- new EDW for d/c; pt lives alone- 10.  Hx ANCA vasculitis  11. DM 12. Florinef-unclear hx behind using this med apparently on prior to [facility]ing to [facility] . Will dc. 13. Deconditioning - has had prior placement for rehab 14. Frequent falls/ hx SDH Jan - no hep hd     Lenny Pastelavid Zeyfang, PA-C [facility] Bee[facility] [phone] 04/05/2016,10:49 AM  LOS: 3 days   Pt seen, examined and agree w A/P as above.  Vinson Moselleob Mussa Groesbeck MD [facility] pager [phone]    cell [phone] 04/05/2016, 3:42 PM    Labs: Basic Metabolic Panel:  Recent Labs Lab 03/30/16 1649  04/01/16 0559 04/02/16 1350 04/04/16 0703  NA  --   < > 135 135 133*  K  --   < > 4.4 5.9* 4.4  CL  --   < > 99* 101 96*  CO2  --   < > 28 28 28   GLUCOSE  --   < > 149* 134* 159*  BUN  --   < > 11 19 19   CREATININE 3.42*  < > 2.01* 3.14* 3.12*  CALCIUM  --   < > 7.4* 8.1* 7.7*  PHOS 3.1  --   --   --  2.6  < > = values in this interval not displayed. Liver Function Tests:  Recent Labs Lab 03/30/16 1447 04/04/16 0703  AST 26  --   ALT 19  --   ALKPHOS 102  --   BILITOT 1.2  --   PROT 4.3*  --   ALBUMIN 2.3* 2.0*   CBC:  Recent Labs Lab 03/30/16 1447 03/30/16 1649 03/31/16 0452 04/04/16 0555 04/04/16 1217  WBC 8.1 7.6 6.4 4.2 3.9*  NEUTROABS 6.3  --   --   --   --   HGB 8.8* 9.0* 9.0* 9.5* 9.3*  HCT 27.7* 27.8* 28.0* 29.0* 29.1*  MCV 87.7 88.3 87.5 89.8 90.1  PLT 185 182 180 112* 92*   Cardiac Enzymes:  Recent Labs Lab 03/30/16 2138 03/31/16 0452  TROPONINI 0.51* 0.49*   CBG:  Recent Labs Lab 04/04/16 0531 04/04/16 1152 04/04/16 1711 04/04/16 2134 04/05/16 0752  GLUCAP 157* 101* 166* 70 96      Medications:   . albumin human  25 g Intravenous Once  . aspirin EC  81 mg Oral Daily  . citalopram  20 mg Oral Daily  . darbepoetin (ARANESP) injection - DIALYSIS  60 mcg Intravenous Q Mon-HD  . enoxaparin (LOVENOX) injection  30 mg Subcutaneous Q24H  . feeding supplement  1 Container Oral BID BM   . feeding supplement (PRO-STAT SUGAR FREE 64)  30 mL Oral Q2000  . ferric gluconate (FERRLECIT/NULECIT) IV  125 mg Intravenous Q M,W,F-HD  . fludrocortisone  0.1 mg Oral Daily  . folic acid  1 mg Oral Daily  . insulin aspart  0-9 Units Subcutaneous TID WC  . lisinopril  10 mg Oral Daily  . metoprolol succinate  25 mg Oral Daily  . pantoprazole  20 mg Oral Daily  . sodium chloride flush  3 mL Intravenous Q12H  . vancomycin  500 mg Intravenous Q M,W,F-HD

## 2016-04-05 NOTE — Progress Notes (Signed)
INFECTIOUS DISEASE PROGRESS NOTE  ID: Jeffrey Walls is a 75 y.o. male with  Principal Problem:   Bacteremia, coagulase-negative staphylococcal Active Problems:   ESRD on hemodialysis (HCC)   Anemia of renal disease   Frequent falls   Essential hypertension   Diabetes mellitus with diabetic nephropathy, with long-term current use of insulin (HCC)   Protein-calorie malnutrition, severe   Syncope   Elevated troponin   Acute combined systolic and diastolic heart failure (HCC)  Subjective: C/o back pain, unable to sleep  Abtx:  Anti-infectives    Start     Dose/Rate Route Frequency Ordered Stop   04/04/16 1200  vancomycin (VANCOCIN) 500 mg in sodium chloride 0.9 % 100 mL IVPB     500 mg 100 mL/hr over 60 Minutes Intravenous Every M-W-F (Hemodialysis) 04/02/16 1630     04/02/16 1800  cefTAZidime (FORTAZ) 2 g in dextrose 5 % 50 mL IVPB  Status:  Discontinued     2 g 100 mL/hr over 30 Minutes Intravenous Every M-W-F (1800) 04/02/16 1624 04/04/16 1148   04/02/16 1630  vancomycin (VANCOCIN) IVPB 1000 mg/200 mL premix     1,000 mg 200 mL/hr over 60 Minutes Intravenous  Once 04/02/16 1623 04/02/16 1740      Medications:  Scheduled: . albumin human  25 g Intravenous Once  . aspirin EC  81 mg Oral Daily  . citalopram  20 mg Oral Daily  . darbepoetin (ARANESP) injection - DIALYSIS  60 mcg Intravenous Q Mon-HD  . enoxaparin (LOVENOX) injection  30 mg Subcutaneous Q24H  . feeding supplement  1 Container Oral BID BM  . feeding supplement (PRO-STAT SUGAR FREE 64)  30 mL Oral Q2000  . ferric gluconate (FERRLECIT/NULECIT) IV  125 mg Intravenous Q M,W,F-HD  . fludrocortisone  0.1 mg Oral Daily  . folic acid  1 mg Oral Daily  . insulin aspart  0-9 Units Subcutaneous TID WC  . lisinopril  10 mg Oral Daily  . metoprolol succinate  25 mg Oral Daily  . pantoprazole  20 mg Oral Daily  . sodium chloride flush  3 mL Intravenous Q12H  . vancomycin  500 mg Intravenous Q M,W,F-HD     Objective: Vital signs in last 24 hours: Temp:  [97.3 F (36.3 C)-98 F (36.7 C)] 98 F (36.7 C) (04/22 0800) Pulse Rate:  [59-65] 62 (04/22 0800) Resp:  [16-18] 18 (04/22 0800) BP: (115-144)/(43-65) 144/58 mmHg (04/22 0800) SpO2:  [97 %-100 %] 100 % (04/22 0800) Weight:  [53.7 kg (118 lb 6.2 oz)] 53.7 kg (118 lb 6.2 oz) (04/22 0600)   General appearance: alert, cooperative and mild distress Resp: clear to auscultation bilaterally Cardio: regular rate and rhythm GI: normal findings: bowel sounds normal and soft, non-tender LUE HD fistula + bruit   Lab Results  Recent Labs  04/02/16 1350 04/04/16 0555 04/04/16 0703 04/04/16 1217  WBC  --  4.2  --  3.9*  HGB  --  9.5*  --  9.3*  HCT  --  29.0*  --  29.1*  NA 135  --  133*  --   K 5.9*  --  4.4  --   CL 101  --  96*  --   CO2 28  --  28  --   BUN 19  --  19  --   CREATININE 3.14*  --  3.12*  --    Liver Panel  Recent Labs  04/04/16 0703  ALBUMIN 2.0*   Sedimentation Rate No  results for input(s): ESRSEDRATE in the last 72 hours. C-Reactive Protein No results for input(s): CRP in the last 72 hours.  Microbiology: Recent Results (from the past 240 hour(s))  MRSA PCR Screening     Status: None   Collection Time: 03/30/16  6:37 PM  Result Value Ref Range Status   MRSA by PCR NEGATIVE NEGATIVE Final    Comment:        The GeneXpert MRSA Assay (FDA approved for NASAL specimens only), is one component of a comprehensive MRSA colonization surveillance program. It is not intended to diagnose MRSA infection nor to guide or monitor treatment for MRSA infections.   Urine culture     Status: None   Collection Time: 03/31/16  8:58 AM  Result Value Ref Range Status   Specimen Description URINE, CLEAN CATCH  Final   Special Requests NONE  Final   Culture MULTIPLE SPECIES PRESENT, SUGGEST RECOLLECTION  Final   Report Status 04/01/2016 FINAL  Final  Culture, blood (routine x 2)     Status: Abnormal    Collection Time: 04/02/16  4:05 PM  Result Value Ref Range Status   Specimen Description BLOOD HEMODIALYSIS LINE  Final   Special Requests BOTTLES DRAWN AEROBIC AND ANAEROBIC 10CC  Final   Culture  Setup Time   Final    GRAM POSITIVE COCCI IN CLUSTERS IN BOTH AEROBIC AND ANAEROBIC BOTTLES CRITICAL RESULT CALLED TO, READ BACK BY AND VERIFIED WITH: Patsey Berthold. Eckelmann RN 13:15 04/03/16 ([email or web address])    Culture STAPHYLOCOCCUS SPECIES (COAGULASE NEGATIVE) (A)  Final   Report Status 04/05/2016 FINAL  Final   Organism ID, Bacteria STAPHYLOCOCCUS SPECIES (COAGULASE NEGATIVE)  Final      Susceptibility   Staphylococcus species (coagulase negative) - MIC*    CIPROFLOXACIN >=8 RESISTANT Resistant     ERYTHROMYCIN <=0.25 SENSITIVE Sensitive     GENTAMICIN <=0.5 SENSITIVE Sensitive     OXACILLIN >=4 RESISTANT Resistant     TETRACYCLINE <=1 SENSITIVE Sensitive     VANCOMYCIN 1 SENSITIVE Sensitive     TRIMETH/SULFA 160 RESISTANT Resistant     CLINDAMYCIN <=0.25 SENSITIVE Sensitive     RIFAMPIN <=0.5 SENSITIVE Sensitive     Inducible Clindamycin NEGATIVE Sensitive     * STAPHYLOCOCCUS SPECIES (COAGULASE NEGATIVE)  Culture, blood (routine x 2)     Status: Abnormal   Collection Time: 04/02/16  4:35 PM  Result Value Ref Range Status   Specimen Description BLOOD RIGHT ARM  Final   Special Requests   Final    BOTTLES DRAWN AEROBIC AND ANAEROBIC 4CC ANA 5CC AER   Culture  Setup Time   Final    GRAM POSITIVE COCCI IN CLUSTERS IN BOTH AEROBIC AND ANAEROBIC BOTTLES CRITICAL RESULT CALLED TO, READ BACK BY AND VERIFIED WITH: Patsey Berthold. Eckelmann RN 16:45 04/03/16 ([email or web address])    Culture (A)  Final    STAPHYLOCOCCUS SPECIES (COAGULASE NEGATIVE) SUSCEPTIBILITIES PERFORMED ON PREVIOUS CULTURE WITHIN THE LAST 5 DAYS.    Report Status 04/05/2016 FINAL  Final  Culture, blood (Routine X 2) w Reflex to ID Panel     Status: None (Preliminary result)   Collection Time: 04/04/16  3:28 PM  Result Value Ref Range Status   Specimen  Description BLOOD RIGHT HAND  Final   Special Requests IN PEDIATRIC BOTTLE 0.5CC  Final   Culture NO GROWTH < 24 HOURS  Final   Report Status PENDING  Incomplete  Cath Tip Culture     Status: None (Preliminary result)   Collection Time: 04/04/16  5:19 PM  Result Value Ref Range Status   Specimen Description CATH TIP RIGHT NECK  Final   Special Requests NONE  Final   Culture PENDING  Incomplete   Report Status PENDING  Incomplete    Studies/Results: No results found.   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

## 2016-04-05 NOTE — Progress Notes (Signed)
Patient c/o SOB and stating he "can't breathe and I'm just going to die in here!" Patient also demanding to be placed on O2 n/c, despite O2 sats being 100% on RA. Patients lungs are clear throughout. Dr. Jerral RalphGhimire notified. New orders received for prn xanax and PCXR; xanax given at 1807.   Leanna BattlesEckelmann, Josephina Melcher Eileen, RN.

## 2016-04-05 NOTE — Progress Notes (Addendum)
PROGRESS NOTE        PATIENT DETAILS Name: Jeffrey Walls Age: 75 y.o. Sex: male Date of Birth: [birth year] Admit Date: 03/30/2016 Admitting Physician Haydee Salter, MD WUJ:WJXBJY,NWGNFAOZH, MD  Brief Narrative: Patient is a 75 y.o. 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 Hospital course complicated by development of acute systolic heart failure and fever on 4/19 with blood cultures positive for coag-negative staph-felt to be secondary to dialysis catheter related blood stream infection.Also found to have a right atrial mass on transthoracic echocardiogram on 4/19 Plans are for TEE early next week, hemodialysis catheter has been removed by vascular surgery. See below for further details.   Subjective: Continues to have back pain. But feels somewhat better.  Assessment/Plan: Principal Problem: Bacteremia, coagulase-negative staphylococcal: Probably secondary to hemodialysis catheter-related bloodstream infection. Hemodialysis catheter has been removed on 4/21, repeat blood cultures on 4/21 are negative so far. Infectious disease following, recommendations are to continue intravenous vancomycin, pursue TEE and MRI of the lumbosacral spine.  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 Echo shows mildly decreased EF around 40%,. Telemetry negative. Will check lower extremity Doppler. Spoke with cardiologist-Dr. Adella Hare reviewed chart, did not advise anticoagulate patient in this setting. 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 TEE to be arranged by cardiology early  next week  Acute systolic heart failure: EF by TTE on 4/19 around 40-45%. Volume removal hemodialysis. Nephrology slowly decreasing dry weight.   Bilateral pleural effusion: Suspect related to CHF. Volume removal with hemodialysis. May need thoracocentesis if no improvement.  Elevated troponin: Likely false-positive elevation in the setting of ESRD-trend is flat not consistent with ACS. However does have known history of CAD. Will plan on medical treatment   Pulmonary hypertension: PA pressure of 60 mmHg via transthoracic echocardiogram, reviewed outpatient echocardiogram (September 2016) in care everywhere-  showed mild pulmonary hypertension. Suspect this is related to underlying Anca vasculitis.   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 Nephrology following   Hypertension:  BP controlled, continue lisinopril and metoprolol-may need to further adjust dosing depending on post dialysis dry weight.  Type 2 diabetes: CBGs stable-continue SSI.   Back pain:  Given bacteremia-Check MRI lumbosacral spine.  Anemia of renal disease: Management as per nephrology  Thrombocytopenia:? Etiology-could be related to bacteremia, repeat CBC tomorrow-cautiously continue with Lovenox  Frequent falls: non focal exam-check orthostatics-PT eval  Protein-calorie malnutrition, severe: Continue supplements.   DVT Prophylaxis: Prophylactic Lovenox   Code Status: Full code or DNR  Family Communication: None at bedside  Disposition Plan: Remain inpatient-not stable for discharge yet  Antimicrobial agents: IV Vanco 4/19>> IV Elita Quick 4/19>>4/21  Procedures: Echo 4/19-Moderate aortic regurg, ejection fraction 40-45 percent, grade 2 diastolic dysfunction-with right atrial mass  CONSULTS:  cardiology  Time spent: 25 minutes-Greater than 50% of this time was spent in counseling, explanation of diagnosis, planning of further  management, and coordination of care.  MEDICATIONS: Anti-infectives    Start     Dose/Rate Route Frequency Ordered Stop  04/04/16 1200  vancomycin (VANCOCIN) 500 mg in sodium chloride 0.9 % 100 mL IVPB     500 mg 100 mL/hr over 60 Minutes Intravenous Every M-W-F (Hemodialysis) 04/02/16 1630     04/02/16 1800  cefTAZidime (FORTAZ) 2 g in dextrose 5 % 50 mL IVPB  Status:  Discontinued     2 g 100 mL/hr over 30 Minutes Intravenous Every M-W-F (1800) 04/02/16 1624 04/04/16 1148   04/02/16 1630  vancomycin (VANCOCIN) IVPB 1000 mg/200 mL premix     1,000 mg 200 mL/hr over 60 Minutes Intravenous  Once 04/02/16 1623 04/02/16 1740      Scheduled Meds: . albumin human  25 g Intravenous Once  . aspirin EC  81 mg Oral Daily  . citalopram  20 mg Oral Daily  . darbepoetin (ARANESP) injection - DIALYSIS  60 mcg Intravenous Q Mon-HD  . enoxaparin (LOVENOX) injection  30 mg Subcutaneous Q24H  . feeding supplement  1 Container Oral BID BM  . feeding supplement (PRO-STAT SUGAR FREE 64)  30 mL Oral Q2000  . ferric gluconate (FERRLECIT/NULECIT) IV  125 mg Intravenous Q M,W,F-HD  . fludrocortisone  0.1 mg Oral Daily  . folic acid  1 mg Oral Daily  . insulin aspart  0-9 Units Subcutaneous TID WC  . lisinopril  10 mg Oral Daily  . metoprolol succinate  25 mg Oral Daily  . pantoprazole  20 mg Oral Daily  . sodium chloride flush  3 mL Intravenous Q12H  . vancomycin  500 mg Intravenous Q M,W,F-HD   Continuous Infusions:  PRN Meds:.sodium chloride, sodium chloride, acetaminophen **OR** acetaminophen, alteplase, heparin, heparin, lidocaine (PF), lidocaine-prilocaine, morphine injection, ondansetron **OR** ondansetron (ZOFRAN) IV, oxyCODONE, oxyCODONE-acetaminophen, pentafluoroprop-tetrafluoroeth, senna-docusate, zolpidem   PHYSICAL EXAM: Vital signs: Filed Vitals:   04/04/16 1921 04/04/16 2134 04/05/16 0600 04/05/16 0800  BP: 115/43 143/65 123/51 144/58  Pulse: 59 65 64 62  Temp: 97.9 F (36.6 C)  97.3 F (36.3 C) 97.7 F (36.5 C) 98 F (36.7 C)  TempSrc: Oral Oral Oral Oral  Resp: 16 17 18 18   Height:      Weight:   53.7 kg (118 lb 6.2 oz)   SpO2: 97% 100% 99% 100%   Filed Weights   04/04/16 0305 04/04/16 0637 04/05/16 0600  Weight: 56 kg (123 lb 7.3 oz) 53.6 kg (118 lb 2.7 oz) 53.7 kg (118 lb 6.2 oz)   Body mass index is 17.47 kg/(m^2).   Gen Exam: Awake and alert with clear speech. Not in any distress  Neck: Supple, No JVD.  Chest: B/L Clear.   CVS: S1 S2 Regular Abdomen: soft, BS +, non tender, non distended.  Extremities: no edema, lower extremities warm to touch. Neurologic: Non Focal.   Skin: No Rash or lesions   Wounds: N/A.    LABORATORY DATA: CBC:  Recent Labs Lab 03/30/16 1447 03/30/16 1649 03/31/16 0452 04/04/16 0555 04/04/16 1217  WBC 8.1 7.6 6.4 4.2 3.9*  NEUTROABS 6.3  --   --   --   --   HGB 8.8* 9.0* 9.0* 9.5* 9.3*  HCT 27.7* 27.8* 28.0* 29.0* 29.1*  MCV 87.7 88.3 87.5 89.8 90.1  PLT 185 182 180 112* 92*    Basic Metabolic Panel:  Recent Labs Lab 03/30/16 1447 03/30/16 1649 03/31/16 0452 04/01/16 0559 04/02/16 1350 04/04/16 0703  NA 137  --  138 135 135 133*  K 3.1*  --  2.9* 4.4 5.9* 4.4  CL 97*  --  97* 99* 101  96*  CO2 28  --  GLUCOSE 105*  --  115* 149* 134* 159*  BUN 12  --  CREATININE 3.24* 3.42* 3.72* 2.01* 3.14* 3.12*  CALCIUM 8.1*  --  8.0* 7.4* 8.1* 7.7*  MG  --  1.7  --   --   --   --   PHOS  --  3.1  --   --   --  2.6    GFR: Estimated Creatinine Clearance: 16 mL/min (by C-G formula based on Cr of 3.12).  Liver Function Tests:  Recent Labs Lab 03/30/16 1447 04/04/16 0703  AST 26  --   ALT 19  --   ALKPHOS 102  --   BILITOT 1.2  --   PROT 4.3*  --   ALBUMIN 2.3* 2.0*   No results for input(s): LIPASE, AMYLASE in the last 168 hours. No results for input(s): AMMONIA in the last 168 hours.  Coagulation Profile:  Recent Labs Lab 03/30/16 1447  INR 1.27    Cardiac  Enzymes:  Recent Labs Lab 03/30/16 2138 03/31/16 0452  TROPONINI 0.51* 0.49*    BNP (last 3 results) No results for input(s): PROBNP in the last 8760 hours.  HbA1C: No results for input(s): HGBA1C in the last 72 hours.  CBG:  Recent Labs Lab 04/04/16 1152 04/04/16 1711 04/04/16 2134 04/05/16 0752 04/05/16 1243  GLUCAP 101* 166* 70 96 112*    Lipid Profile: No results for input(s): CHOL, HDL, LDLCALC, TRIG, CHOLHDL, LDLDIRECT in the last 72 hours.  Thyroid Function Tests: No results for input(s): TSH, T4TOTAL, FREET4, T3FREE, THYROIDAB in the last 72 hours.  Anemia Panel: No results for input(s): VITAMINB12, FOLATE, FERRITIN, TIBC, IRON, RETICCTPCT in the last 72 hours.  Urine analysis:    Component Value Date/Time   COLORURINE YELLOW 03/31/2016 0858   APPEARANCEUR CLOUDY* 03/31/2016 0858   LABSPEC 1.030 03/31/2016 0858   PHURINE 6.0 03/31/2016 0858   GLUCOSEU 250* 03/31/2016 0858   HGBUR MODERATE* 03/31/2016 0858   BILIRUBINUR MODERATE* 03/31/2016 0858   KETONESUR 15* 03/31/2016 0858   PROTEINUR >300* 03/31/2016 0858   NITRITE NEGATIVE 03/31/2016 0858   LEUKOCYTESUR NEGATIVE 03/31/2016 0858    Sepsis Labs: Lactic Acid, Venous    Component Value Date/Time   LATICACIDVEN 0.79 03/30/2016 1703    MICROBIOLOGY: Recent Results (from the past 240 hour(s))  MRSA PCR Screening     Status: None   Collection Time: 03/30/16  6:37 PM  Result Value Ref Range Status   MRSA by PCR NEGATIVE NEGATIVE Final    Comment:        The GeneXpert MRSA Assay (FDA approved for NASAL specimens only), is one component of a comprehensive MRSA colonization surveillance program. It is not intended to diagnose MRSA infection nor to guide or monitor treatment for MRSA infections.   Urine culture     Status: None   Collection Time: 03/31/16  8:58 AM  Result Value Ref Range Status   Specimen Description URINE, CLEAN CATCH  Final   Special Requests NONE  Final   Culture  MULTIPLE SPECIES PRESENT, SUGGEST RECOLLECTION  Final   Report Status 04/01/2016 FINAL  Final  Culture, blood (routine x 2)     Status: Abnormal   Collection Time: 04/02/16  4:05 PM  Result Value Ref Range Status   Specimen Description BLOOD HEMODIALYSIS LINE  Final   Special Requests BOTTLES DRAWN AEROBIC AND ANAEROBIC 10CC  Final  Culture  Setup Time   Final    GRAM POSITIVE COCCI IN CLUSTERS IN BOTH AEROBIC AND ANAEROBIC BOTTLES CRITICAL RESULT CALLED TO, READ BACK BY AND VERIFIED WITH: Patsey Berthold RN 13:15 04/03/16 ([email or web address])    Culture STAPHYLOCOCCUS SPECIES (COAGULASE NEGATIVE) (A)  Final   Report Status 04/05/2016 FINAL  Final   Organism ID, Bacteria STAPHYLOCOCCUS SPECIES (COAGULASE NEGATIVE)  Final      Susceptibility   Staphylococcus species (coagulase negative) - MIC*    CIPROFLOXACIN >=8 RESISTANT Resistant     ERYTHROMYCIN <=0.25 SENSITIVE Sensitive     GENTAMICIN <=0.5 SENSITIVE Sensitive     OXACILLIN >=4 RESISTANT Resistant     TETRACYCLINE <=1 SENSITIVE Sensitive     VANCOMYCIN 1 SENSITIVE Sensitive     TRIMETH/SULFA 160 RESISTANT Resistant     CLINDAMYCIN <=0.25 SENSITIVE Sensitive     RIFAMPIN <=0.5 SENSITIVE Sensitive     Inducible Clindamycin NEGATIVE Sensitive     * STAPHYLOCOCCUS SPECIES (COAGULASE NEGATIVE)  Culture, blood (routine x 2)     Status: Abnormal   Collection Time: 04/02/16  4:35 PM  Result Value Ref Range Status   Specimen Description BLOOD RIGHT ARM  Final   Special Requests   Final    BOTTLES DRAWN AEROBIC AND ANAEROBIC 4CC ANA 5CC AER   Culture  Setup Time   Final    GRAM POSITIVE COCCI IN CLUSTERS IN BOTH AEROBIC AND ANAEROBIC BOTTLES CRITICAL RESULT CALLED TO, READ BACK BY AND VERIFIED WITH: Patsey Berthold RN 16:45 04/03/16 ([email or web address])    Culture (A)  Final    STAPHYLOCOCCUS SPECIES (COAGULASE NEGATIVE) SUSCEPTIBILITIES PERFORMED ON PREVIOUS CULTURE WITHIN THE LAST 5 DAYS.    Report Status 04/05/2016 FINAL  Final  Culture, blood (Routine  X 2) w Reflex to ID Panel     Status: None (Preliminary result)   Collection Time: 04/04/16  3:28 PM  Result Value Ref Range Status   Specimen Description BLOOD RIGHT HAND  Final   Special Requests IN PEDIATRIC BOTTLE 0.5CC  Final   Culture NO GROWTH < 24 HOURS  Final   Report Status PENDING  Incomplete  Cath Tip Culture     Status: None (Preliminary result)   Collection Time: 04/04/16  5:19 PM  Result Value Ref Range Status   Specimen Description CATH TIP RIGHT NECK  Final   Special Requests NONE  Final   Culture PENDING  Incomplete   Report Status PENDING  Incomplete    RADIOLOGY STUDIES/RESULTS: Dg Chest 1 View  03/30/2016  CLINICAL DATA:  Syncopal episode EXAM: CHEST 1 VIEW COMPARISON:  02/28/2016 FINDINGS: Cardiac shadow is stable. Right jugular dialysis catheter is again seen and stable. The lungs are well aerated bilaterally. No focal infiltrate or sizable effusion is seen. No significant edema is seen. No bony abnormality is noted. IMPRESSION: No active disease. Electronically Signed   By: Alcide Clever M.D.   On: 03/30/2016 16:43   Dg Chest 2 View  04/03/2016  CLINICAL DATA:  Short of breath today.  Pleural effusions. EXAM: CHEST  2 VIEW COMPARISON:  Chest CT, 04/01/2016.  Chest radiograph, 03/30/2016. FINDINGS: Cardiac silhouette is normal in size and configuration. No mediastinal or hilar masses or pathologically enlarged lymph nodes. Right internal jugular dual-lumen tunneled central venous catheter has its tip projecting in the right atrium. This is stable. There are small, right greater than left, pleural effusions. These appear smaller than noted on the recent prior CT. There is no convincing pneumonia or pulmonary edema. No  pneumothorax. IMPRESSION: 1. Small, right greater left, pleural effusions which appears smaller than they were on the recent prior chest CT. 2. No evidence of pneumonia or pulmonary edema. Electronically Signed   By: Amie Portland M.D.   On: 04/03/2016 12:44    Dg Thoracic Spine 2 View  03/30/2016  CLINICAL DATA:  Upper and mid back pain after falling today. EXAM: THORACIC SPINE 2 VIEWS COMPARISON:  02/28/2016 chest radiograph FINDINGS: Right-sided dialysis catheter with tip at cavoatrial junction or high right atrium. Lateral view images from approximately the bottom of C6 through the bottom of T11. Moderate spondylosis across these levels, without vertebral body height loss. Lower thoracic vertebral body height maintained on lumbar spine radiographs, dictated separately. IMPRESSION: Spondylosis, without acute finding in the thoracic spine. Electronically Signed   By: Jeronimo Greaves M.D.   On: 03/30/2016 16:44   Dg Lumbar Spine Complete  03/30/2016  CLINICAL DATA:  Patient status post fall. Mid and lower back pain. Initial encounter. EXAM: LUMBAR SPINE - COMPLETE 4+ VIEW COMPARISON:  None. FINDINGS: Mild leftward curvature of the lumbar spine. Preservation of the vertebral body and intervertebral disc space heights. SI joints are unremarkable. No evidence for acute lumbar spine fracture. Lower lumbar spine degenerative disc and facet disease. IMPRESSION: Degenerative disc disease.  No acute osseous abnormality. Electronically Signed   By: Annia Belt M.D.   On: 03/30/2016 16:43   Ct Head Wo Contrast  03/30/2016  CLINICAL DATA:  Initial encounter for MULTIPLE RECENT FALLS, TODAY PT. FELL BUT DENIES LOC, GENERALIZED WEAKNESS, HX ESRD-DM-SYNCOPE-HTN-CAD, LW EXAM: CT HEAD WITHOUT CONTRAST CT CERVICAL SPINE WITHOUT CONTRAST TECHNIQUE: Multidetector CT imaging of the head and cervical spine was performed following the standard protocol without intravenous contrast. Multiplanar CT image reconstructions of the cervical spine were also generated. COMPARISON:  Head CT of 12/26/2015. Prior head and cervical spine CTs 12/25/2015. FINDINGS: CT HEAD FINDINGS Sinuses/Soft tissues: No significant soft tissue swelling. Mild ethmoid air cell mucosal thickening. No skull fracture.  Clear mastoid air cells. Intracranial: Marked improvement to resolution of previously described subdural collection adjacent the left frontal lobe. Minimal prominence of extra-axial spaces remains, including image 17/series 201. Hypo attenuating. Bilateral vertebral artery atherosclerosis. Moderate low density in the periventricular white matter likely related to small vessel disease. Resolved right-sided extra-axial/subdural fluid collection. 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 No acute large vessel cortically based infarct. No new hemorrhage, intra-axial, or extra-axial fluid collection. No mass lesion. CT CERVICAL SPINE FINDINGS Spinal visualization through the bottom of T2. Prevertebral soft tissues are within normal limits. Left worse than right carotid atherosclerosis. A right-sided central line is incompletely imaged. Small right pleural effusion is similar to 12/25/2015. No apical pneumothorax. Base interlobular septal thickening which suggests interstitial edema. Skull base intact.  Skull base intact. Trace C2-3 anterolisthesis is similar and likely degenerative. Maintenance of vertebral body height. Straightening of expected lordosis. Facets are well-aligned. Multilevel spondylosis, with loss of intervertebral disc height and endplate osteophyte formation. C1-2 articulation demonstrates only degenerative change on coronal reformats. IMPRESSION: 1. No acute intracranial abnormality. Cerebral atrophy is small vessel ischemic change. 2. Resolved right and markedly improved to resolved left-sided subdural fluid collections. There is minimal residual prominence of the left extra-axial space adjacent the frontal lobe. 3. Cervical spondylosis, without acute fracture or subluxation. Straightening of expected cervical lordosis could be positional, due to muscular spasm, or ligamentous injury. 4. Similar small right pleural effusion. Suspect interstitial edema/congestive heart failure. Electronically  Signed   By:  Jeronimo Greaves M.D.   On: 03/30/2016 17:36   Ct Chest Wo Contrast  04/01/2016  CLINICAL DATA:  Shortness of breath today in patient admitted to the hospital with a chief complaint of syncope on 03/30/2016. The patient denies cough. End-stage renal disease. Initial encounter. EXAM: CT CHEST WITHOUT CONTRAST TECHNIQUE: Multidetector CT imaging of the chest was performed following the standard protocol without IV contrast. COMPARISON:  PA and lateral chest 02/28/2016. FINDINGS: The patient has moderate bilateral pleural effusions, greater on the right. Mild cardiomegaly is seen. Small pericardial effusion is identified. Extensive calcific coronary artery disease is present. Dialysis catheter from a right IJ approach is noted. There is no axillary, hilar or mediastinal lymphadenopathy. The lungs demonstrate mild compressive atelectasis secondary to the patient's pleural effusions, slightly greater on the right. There is also some interlobular septal thickening in the upper lobes which is worse on the left. The lungs are otherwise unremarkable. Visualized upper abdomen demonstrates extensive atherosclerosis. No focal abnormality is seen. No focal bony abnormality is identified. IMPRESSION: Moderate bilateral pleural effusions, larger on the right. Interlobular septal thickening in the upper lobes bilaterally is compatible with mild edema. Small pericardial effusion. Extensive calcific coronary artery disease. Electronically Signed   By: Drusilla Kanner M.D.   On: 04/01/2016 13:15   Ct Cervical Spine Wo Contrast  03/30/2016  CLINICAL DATA:  Initial encounter for MULTIPLE RECENT FALLS, TODAY PT. FELL BUT DENIES LOC, GENERALIZED WEAKNESS, HX ESRD-DM-SYNCOPE-HTN-CAD, LW EXAM: CT HEAD WITHOUT CONTRAST CT CERVICAL SPINE WITHOUT CONTRAST TECHNIQUE: Multidetector CT imaging of the head and cervical spine was performed following the standard protocol without intravenous contrast. Multiplanar CT image  reconstructions of the cervical spine were also generated. COMPARISON:  Head CT of 12/26/2015. Prior head and cervical spine CTs 12/25/2015. FINDINGS: CT HEAD FINDINGS Sinuses/Soft tissues: No significant soft tissue swelling. Mild ethmoid air cell mucosal thickening. No skull fracture. Clear mastoid air cells. Intracranial: Marked improvement to resolution of previously described subdural collection adjacent the left frontal lobe. Minimal prominence of extra-axial spaces remains, including image 17/series 201. Hypo attenuating. Bilateral vertebral artery atherosclerosis. Moderate low density in the periventricular white matter likely related to small vessel disease. Resolved right-sided extra-axial/subdural fluid collection. 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 No acute large vessel cortically based infarct. No new hemorrhage, intra-axial, or extra-axial fluid collection. No mass lesion. CT CERVICAL SPINE FINDINGS Spinal visualization through the bottom of T2. Prevertebral soft tissues are within normal limits. Left worse than right carotid atherosclerosis. A right-sided central line is incompletely imaged. Small right pleural effusion is similar to 12/25/2015. No apical pneumothorax. Base interlobular septal thickening which suggests interstitial edema. Skull base intact.  Skull base intact. Trace C2-3 anterolisthesis is similar and likely degenerative. Maintenance of vertebral body height. Straightening of expected lordosis. Facets are well-aligned. Multilevel spondylosis, with loss of intervertebral disc height and endplate osteophyte formation. C1-2 articulation demonstrates only degenerative change on coronal reformats. IMPRESSION: 1. No acute intracranial abnormality. Cerebral atrophy is small vessel ischemic change. 2. Resolved right and markedly improved to resolved left-sided subdural fluid collections. There is minimal residual prominence of the left extra-axial space adjacent the frontal  lobe. 3. Cervical spondylosis, without acute fracture or subluxation. Straightening of expected cervical lordosis could be positional, due to muscular spasm, or ligamentous injury. 4. Similar small right pleural effusion. Suspect interstitial edema/congestive heart failure. Electronically Signed   By: Jeronimo Greaves M.D.   On: 03/30/2016 17:36     LOS: 3 days  Jeoffrey MassedGHIMIRE,Kellene Mccleary, MD  [facility] Pager:[phone]  If 7PM-7AM, please contact night-coverage [email or web address] Password [facility] 04/05/2016, 3:16 PM

## 2016-04-06 ENCOUNTER — Inpatient Hospital Stay (HOSPITAL_COMMUNITY): Payer: Medicare HMO | Admitting: Anesthesiology

## 2016-04-06 ENCOUNTER — Encounter (HOSPITAL_COMMUNITY)
Admission: EM | Disposition: A | Discharge status: Skilled Nursing Facility | Payer: Self-pay | Source: Home / Self Care | Attending: Internal Medicine

## 2016-04-06 ENCOUNTER — Inpatient Hospital Stay (HOSPITAL_COMMUNITY): Payer: Medicare HMO

## 2016-04-06 DIAGNOSIS — N186 End stage renal disease: Secondary | ICD-10-CM

## 2016-04-06 DIAGNOSIS — Z992 Dependence on renal dialysis: Secondary | ICD-10-CM

## 2016-04-06 DIAGNOSIS — R609 Edema, unspecified: Secondary | ICD-10-CM

## 2016-04-06 HISTORY — PX: INSERTION OF DIALYSIS CATHETER: SHX1324

## 2016-04-06 LAB — CREATININE, SERUM
Creatinine, Ser: 2.91 mg/dL — ABNORMAL HIGH (ref 0.61–1.24)
GFR calc non Af Amer: 20 mL/min — ABNORMAL LOW (ref >60)
GFR, EST AFRICAN AMERICAN: 23 mL/min — AB (ref >60)

## 2016-04-06 LAB — RENAL FUNCTION PANEL
ALBUMIN: 2.1 g/dL — AB (ref 3.5–5.0)
ANION GAP: 10 (ref 5–15)
BUN: 14 mg/dL (ref 6–20)
CALCIUM: 7.9 mg/dL — AB (ref 8.9–10.3)
CHLORIDE: 99 mmol/L — AB (ref 101–111)
CO2: 26 mmol/L (ref 22–32)
CREATININE: 2.92 mg/dL — AB (ref 0.61–1.24)
GFR calc Af Amer: 23 mL/min — ABNORMAL LOW (ref >60)
GFR calc non Af Amer: 20 mL/min — ABNORMAL LOW (ref >60)
GLUCOSE: 97 mg/dL (ref 65–99)
POTASSIUM: 3.9 mmol/L (ref 3.5–5.1)
Phosphorus: 1.9 mg/dL — ABNORMAL LOW (ref 2.5–4.6)
Sodium: 135 mmol/L (ref 135–145)

## 2016-04-06 LAB — CBC
HEMATOCRIT: 28.6 % — AB (ref 39.0–52.0)
HEMOGLOBIN: 9.2 g/dL — AB (ref 13.0–17.0)
MCH: 28.9 pg (ref 26.0–34.0)
MCHC: 32.2 g/dL (ref 30.0–36.0)
MCV: 89.9 fL (ref 78.0–100.0)
Platelets: 135 10*3/uL — ABNORMAL LOW (ref 150–400)
RBC: 3.18 MIL/uL — ABNORMAL LOW (ref 4.22–5.81)
RDW: 19 % — AB (ref 11.5–15.5)
WBC: 5.6 10*3/uL (ref 4.0–10.5)

## 2016-04-06 LAB — GLUCOSE, CAPILLARY
GLUCOSE-CAPILLARY: 94 mg/dL (ref 65–99)
GLUCOSE-CAPILLARY: 110 mg/dL — AB (ref 65–99)
Glucose-Capillary: 93 mg/dL (ref 65–99)
Glucose-Capillary: 81 mg/dL (ref 65–99)
Glucose-Capillary: 85 mg/dL (ref 65–99)
Glucose-Capillary: 125 mg/dL — ABNORMAL HIGH (ref 65–99)

## 2016-04-06 SURGERY — INSERTION OF DIALYSIS CATHETER
Anesthesia: Monitor Anesthesia Care | Site: Neck

## 2016-04-06 MED ORDER — HEPARIN SODIUM (PORCINE) 1000 UNIT/ML IJ SOLN
INTRAMUSCULAR | Status: DC | PRN
  Administered 2016-04-06: 4.6 mL via INTRAVENOUS

## 2016-04-06 MED ORDER — LIDOCAINE HCL (PF) 1 % IJ SOLN
INTRAMUSCULAR | Status: AC
  Filled 2016-04-06: qty 30

## 2016-04-06 MED ORDER — FENTANYL CITRATE (PF) 100 MCG/2ML IJ SOLN
25.0000 ug | INTRAMUSCULAR | Status: DC | PRN
  Administered 2016-04-06: 25 ug via INTRAVENOUS

## 2016-04-06 MED ORDER — FENTANYL CITRATE (PF) 250 MCG/5ML IJ SOLN
INTRAMUSCULAR | Status: AC
  Filled 2016-04-06: qty 5

## 2016-04-06 MED ORDER — SODIUM CHLORIDE 0.9 % IV SOLN
INTRAVENOUS | Status: DC | PRN
  Administered 2016-04-06: 07:00:00 via INTRAVENOUS

## 2016-04-06 MED ORDER — PROPOFOL 10 MG/ML IV BOLUS
INTRAVENOUS | Status: AC
  Filled 2016-04-06: qty 20

## 2016-04-06 MED ORDER — FENTANYL CITRATE (PF) 100 MCG/2ML IJ SOLN
INTRAMUSCULAR | Status: DC | PRN
  Administered 2016-04-06 (×2): 25 ug via INTRAVENOUS

## 2016-04-06 MED ORDER — IOPAMIDOL (ISOVUE-300) INJECTION 61%
INTRAVENOUS | Status: AC
  Filled 2016-04-06: qty 50

## 2016-04-06 MED ORDER — PROPOFOL 10 MG/ML IV BOLUS
INTRAVENOUS | Status: DC | PRN
  Administered 2016-04-06 (×4): 10 mg via INTRAVENOUS
  Administered 2016-04-06: 20 mg via INTRAVENOUS

## 2016-04-06 MED ORDER — FENTANYL CITRATE (PF) 100 MCG/2ML IJ SOLN
INTRAMUSCULAR | Status: AC
  Filled 2016-04-06: qty 2

## 2016-04-06 MED ORDER — VANCOMYCIN HCL IN DEXTROSE 1-5 GM/200ML-% IV SOLN
1000.0000 mg | INTRAVENOUS | Status: DC
  Filled 2016-04-06: qty 200

## 2016-04-06 MED ORDER — HEPARIN SODIUM (PORCINE) 1000 UNIT/ML IJ SOLN
INTRAMUSCULAR | Status: AC
  Filled 2016-04-06: qty 1

## 2016-04-06 MED ORDER — LIDOCAINE HCL (PF) 1 % IJ SOLN
INTRAMUSCULAR | Status: DC | PRN
  Administered 2016-04-06: 10 mL

## 2016-04-06 MED ORDER — SODIUM CHLORIDE 0.9 % IV SOLN
INTRAVENOUS | Status: DC | PRN
  Administered 2016-04-06: 08:00:00

## 2016-04-06 MED ORDER — 0.9 % SODIUM CHLORIDE (POUR BTL) OPTIME
TOPICAL | Status: DC | PRN
  Administered 2016-04-06: 1000 mL

## 2016-04-06 SURGICAL SUPPLY — 37 items
BAG DECANTER FOR FLEXI CONT (MISCELLANEOUS) ×2 IMPLANT
BIOPATCH RED 1 DISK 7.0 (GAUZE/BANDAGES/DRESSINGS) ×2 IMPLANT
BIOPATCH WHT 1IN DISK W/4.0 H (GAUZE/BANDAGES/DRESSINGS) ×2 IMPLANT
CATH PALINDROME RT-P 15FX19CM (CATHETERS) IMPLANT
CATH PALINDROME RT-P 15FX23CM (CATHETERS) ×2 IMPLANT
CATH PALINDROME RT-P 15FX28CM (CATHETERS) IMPLANT
CATH PALINDROME RT-P 15FX55CM (CATHETERS) IMPLANT
CATH STRAIGHT 5FR 65CM (CATHETERS) IMPLANT
CHLORAPREP W/TINT 26ML (MISCELLANEOUS) ×2 IMPLANT
COVER PROBE W GEL 5X96 (DRAPES) IMPLANT
DECANTER SPIKE VIAL GLASS SM (MISCELLANEOUS) ×2 IMPLANT
DRAPE C-ARM 42X72 X-RAY (DRAPES) ×2 IMPLANT
DRAPE CHEST BREAST 15X10 FENES (DRAPES) ×2 IMPLANT
DRSG TEGADERM 4X4.75 (GAUZE/BANDAGES/DRESSINGS) ×2 IMPLANT
GAUZE SPONGE 2X2 8PLY STRL LF (GAUZE/BANDAGES/DRESSINGS) ×1 IMPLANT
GAUZE SPONGE 4X4 16PLY XRAY LF (GAUZE/BANDAGES/DRESSINGS) ×2 IMPLANT
GLOVE BIO SURGEON STRL SZ7.5 (GLOVE) ×2 IMPLANT
GOWN STRL REUS W/ TWL LRG LVL3 (GOWN DISPOSABLE) ×2 IMPLANT
GOWN STRL REUS W/TWL LRG LVL3 (GOWN DISPOSABLE) ×2
KIT BASIN OR (CUSTOM PROCEDURE TRAY) ×2 IMPLANT
KIT ROOM TURNOVER OR (KITS) ×2 IMPLANT
NEEDLE 18GX1X1/2 (RX/OR ONLY) (NEEDLE) ×2 IMPLANT
NEEDLE 22X1 1/2 (OR ONLY) (NEEDLE) ×2 IMPLANT
NEEDLE HYPO 25GX1X1/2 BEV (NEEDLE) ×2 IMPLANT
NS IRRIG 1000ML POUR BTL (IV SOLUTION) ×2 IMPLANT
PACK SURGICAL SETUP 50X90 (CUSTOM PROCEDURE TRAY) ×2 IMPLANT
PAD ARMBOARD 7.5X6 YLW CONV (MISCELLANEOUS) ×4 IMPLANT
SET MICROPUNCTURE 5F STIFF (MISCELLANEOUS) IMPLANT
SPONGE GAUZE 2X2 STER 10/PKG (GAUZE/BANDAGES/DRESSINGS) ×1
SUT ETHILON 3 0 PS 1 (SUTURE) ×2 IMPLANT
SUT VICRYL 4-0 PS2 18IN ABS (SUTURE) ×2 IMPLANT
SYR 20CC LL (SYRINGE) ×4 IMPLANT
SYR 5ML LL (SYRINGE) ×2 IMPLANT
SYR CONTROL 10ML LL (SYRINGE) ×2 IMPLANT
SYRINGE 10CC LL (SYRINGE) ×2 IMPLANT
WATER STERILE IRR 1000ML POUR (IV SOLUTION) ×2 IMPLANT
WIRE AMPLATZ SS-J .035X180CM (WIRE) IMPLANT

## 2016-04-06 NOTE — Transfer of Care (Signed)
Immediate Anesthesia Transfer of Care Note  Patient: Jeffrey Walls  Procedure(s) Performed: Procedure(s): INSERTION OF DIALYSIS CATHETER (N/A)  Patient Location: PACU  Anesthesia Type:MAC  Level of Consciousness: awake, alert  and oriented  Airway & Oxygen Therapy: Patient Spontanous Breathing and Patient connected to nasal cannula oxygen  Post-op Assessment: Report given to RN, Post -op Vital signs reviewed and stable and Patient moving all extremities X 4  Post vital signs: Reviewed and stable  Last Vitals:  Filed Vitals:   04/05/16 1814 04/06/16 0549  BP: 128/73 173/71  Pulse: 80 69  Temp: 37.1 C 37 C  Resp: 18 18    Complications: No apparent anesthesia complications

## 2016-04-06 NOTE — Anesthesia Postprocedure Evaluation (Signed)
Anesthesia Post Note  Patient: Jeffrey Walls  Procedure(s) Performed: Procedure(s) (LRB): INSERTION OF DIALYSIS CATHETER (N/A)  Patient location during evaluation: PACU Anesthesia Type: MAC Level of consciousness: awake and alert Pain management: pain level controlled Vital Signs Assessment: post-procedure vital signs reviewed and stable Respiratory status: spontaneous breathing, nonlabored ventilation, respiratory function stable and patient connected to nasal cannula oxygen Cardiovascular status: stable and blood pressure returned to baseline Anesthetic complications: no    Last Vitals:  Filed Vitals:   04/06/16 0845 04/06/16 0900  BP:    Pulse: 67 68  Temp:    Resp: 17 17    Last Pain:  Filed Vitals:   04/06/16 0909  PainSc: 2                  Caylin Raby,W. EDMOND

## 2016-04-06 NOTE — Progress Notes (Signed)
VASCULAR LAB PRELIMINARY  PRELIMINARY  PRELIMINARY  PRELIMINARY  Bilateral lower extremity venous duplex completed.    Preliminary report:  There is no DVT or SVT noted in the bilateral lower extremities.   Devarion Mcclanahan, RVT 04/06/2016, 6:39 PM

## 2016-04-06 NOTE — Op Note (Signed)
Procedure: Ultrasound-guided insertion of Diatek catheter, left internal jugular vein  Preoperative diagnosis: End-stage renal disease  Postoperative diagnosis: Same  Anesthesia: Local with IV sedation  Operative findings: 23 cm Diatek catheter left internal jugular vein  Operative details: After obtaining informed consent, the patient was taken to the operating room. The patient was placed in supine position on the operating room table. After adequate sedation the patient's entire neck and chest were prepped and draped in usual sterile fashion. The patient was placed in Trendelenburg position. Ultrasound was used to identify the patient's left internal jugular vein. This had normal compressibility and respiratory variation. Local anesthesia was infiltrated over the left jugular vein.  Using ultrasound guidance, the left internal jugular vein was successfully cannulated.  A 0.035 J-tipped guidewire was threaded into the left internal jugular vein and into the superior vena cava followed by the inferior vena cava under fluoroscopic guidance.   Next sequential 12 and 14 dilators were placed over the guidewire into the right atrium.  A 16 French dilator with a peel-away sheath was then placed over the guidewire into the right atrium.   The guidewire and dilator were removed. A 23 cm Diatek catheter was then placed through the peel away sheath into the right atrium.  The catheter was then tunneled subcutaneously, cut to length, and the hub attached. The catheter was noted to flush and draw easily. The catheter was inspected under fluoroscopy and found with its tip to be in the right atrium without any kinks throughout its course. The catheter was sutured to the skin with nylon sutures. The neck insertion site was closed with Vicryl stitch. The catheter was then loaded with concentrated Heparin solution. A dry sterile dressing was applied.  The patient tolerated procedure well and there were no complications.  Instrument sponge and needle counts correct in the case. The patient was taken to the recovery room in stable condition. Chest x-ray will be obtained in the recovery room.  Fabienne Brunsharles Fields, MD [facility] Office: [phone] Pager: [phone]

## 2016-04-06 NOTE — Progress Notes (Signed)
Subjective:  Cos cramps on Hd yest. Jearld Fenton/permcath removed   Objective Vital signs in last 24 hours: Filed Vitals:   04/06/16 0930 04/06/16 0949 04/06/16 1010 04/06/16 1651  BP: 146/62 157/72 158/72 142/56  Pulse: 64 70 71 64  Temp: 98.7 F (37.1 C) 98.2 F (36.8 C) 98.2 F (36.8 C) 97.6 F (36.4 C)  TempSrc: Oral Oral Oral Oral  Resp: 12 16 12 16   Height:      Weight:      SpO2: 93% 99% 93% 93%   Weight change:  Physical Exam General:Alert , NAD thin chronically ill W elderly M Heart: RRR no rub Lungs: CTA  Abdomen: soft NT/ ND Extremities: no  pedal edema Dialysis Access: LUA AVF pos bruit /  (maturing)/ R IJ cath removed dressing dry /clean  OP Dialysis Orders: MWF [facility] 56kg-leaving below last two tmt 2/2.25 bath Hep none LUA AVF (maturing)/ R IJ cath Mircera 30 ug every 2, last 4/12 Hep lock for cath tfs 27%, ferr 2787 Hb 10.5 pth 115 Ca/P ok  Assessment: 1 MRSE cath sepsis - cath removed/ replaced , on IV vanc 2 Fatigue/ syncope - prob d/t # 1 3 Pulm edema - resolved , lowered dry wt some 4 ESRD MWF hd, no hep w hx SDH 5 Anemia esa 6 HTN dec'd MTP and acei dosing; ^ as needed 7 Hx ANCA dz 8 DM 9 Deconditioning 10 Hx falls/ SDH Jan 11 Dispo - to SNF when ready  P - HD monday   Vinson Moselleob Leilana Mcquire MD [facility] pager [phone]    cell [phone] 04/06/2016, 5:10 PM    Labs: Basic Metabolic Panel:  Recent Labs Lab 04/02/16 1350 04/04/16 0703 04/06/16 0530  NA 135 133* 135  K 5.9* 4.4 3.9  CL 101 96* 99*  CO2 28 28 26   GLUCOSE 134* 159* 97  BUN 19 19 14   CREATININE 3.14* 3.12* 2.91*  2.92*  CALCIUM 8.1* 7.7* 7.9*  PHOS  --  2.6 1.9*   Liver Function Tests:  Recent Labs Lab 04/04/16 0703 04/06/16 0530  ALBUMIN 2.0* 2.1*   CBC:  Recent Labs Lab 03/31/16 0452 04/04/16 0555 04/04/16 1217 04/06/16 0530  WBC 6.4 4.2 3.9* 5.6  HGB 9.0* 9.5* 9.3* 9.2*  HCT 28.0* 29.0* 29.1* 28.6*  MCV 87.5 89.8 90.1 89.9  PLT 180 112*  92* 135*   Cardiac Enzymes:  Recent Labs Lab 03/30/16 2138 03/31/16 0452  TROPONINI 0.51* 0.49*   CBG:  Recent Labs Lab 04/05/16 2044 04/06/16 0624 04/06/16 0843 04/06/16 1007 04/06/16 1145  GLUCAP 91 93 81 94 85      Medications:   . albumin human  25 g Intravenous Once  . aspirin EC  81 mg Oral Daily  . citalopram  20 mg Oral Daily  . darbepoetin (ARANESP) injection - DIALYSIS  60 mcg Intravenous Q Mon-HD  . enoxaparin (LOVENOX) injection  30 mg Subcutaneous Q24H  . feeding supplement  1 Container Oral BID BM  . feeding supplement (PRO-STAT SUGAR FREE 64)  30 mL Oral Q2000  . fentaNYL      . folic acid  1 mg Oral Daily  . insulin aspart  0-9 Units Subcutaneous TID WC  . lisinopril  10 mg Oral Daily  . metoprolol succinate  25 mg Oral Daily  . pantoprazole  20 mg Oral Daily  . sodium chloride flush  3 mL Intravenous Q12H  . vancomycin  500 mg Intravenous Q M,W,F-HD

## 2016-04-06 NOTE — Anesthesia Preprocedure Evaluation (Addendum)
Anesthesia Evaluation  Patient identified by MRN, date of birth, ID band Patient awake    Reviewed: Allergy & Precautions, H&P , NPO status , Patient's Chart, lab work & pertinent test results  Airway Mallampati: II  TM Distance: >3 FB Neck ROM: full  Mouth opening: Limited Mouth Opening  Dental  (+) Teeth Intact, Dental Advidsory Given   Pulmonary shortness of breath and at rest,    breath sounds clear to auscultation       Cardiovascular hypertension, On Medications + CAD   Rhythm:Regular Rate:Normal  Right atrial mass   Neuro/Psych    GI/Hepatic GERD  Poorly Controlled,  Endo/Other  diabetes, Insulin Dependent  Renal/GU ESRF and DialysisRenal disease     Musculoskeletal   Abdominal   Peds  Hematology  (+) anemia ,   Anesthesia Other Findings   Reproductive/Obstetrics                           Anesthesia Physical Anesthesia Plan  ASA: III  Anesthesia Plan: MAC   Post-op Pain Management:    Induction: Intravenous  Airway Management Planned: Simple Face Mask  Additional Equipment:   Intra-op Plan:   Post-operative Plan:   Informed Consent: I have reviewed the patients History and Physical, chart, labs and discussed the procedure including the risks, benefits and alternatives for the proposed anesthesia with the patient or authorized representative who has indicated his/her understanding and acceptance.   Dental Advisory Given and Dental advisory given  Plan Discussed with: Anesthesiologist, Surgeon and CRNA  Anesthesia Plan Comments:        Anesthesia Quick Evaluation

## 2016-04-06 NOTE — Progress Notes (Addendum)
PROGRESS NOTE        PATIENT DETAILS Name: Jeffrey Walls Age: 75 y.o. Sex: male Date of Birth: 1941-08-18 Admit Date: 03/30/2016 Admitting Physician Haydee SalterPhillip M Hobbs, MD ZOX:[id number]:HADLEY,ALEXANDER, MD  Brief Narrative: Patient is a 75 y.o. 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 Hospital course complicated by development of acute systolic heart failure and fever on 4/19 with blood cultures positive for coag-negative staph-felt to be secondary to dialysis catheter related blood stream infection.Also found to have a right atrial mass on transthoracic echocardiogram on 4/19 Plans are for TEE early next week, hemodialysis catheter has been removed by vascular surgery. See below for further details.   Subjective: Claims to have shortness of breath when he lies flat-seen today while sitting up-up. Comfortable. Claims back pain is "okay"   Assessment/Plan: Principal Problem: Bacteremia, coagulase-negative staphylococcal: Probably secondary to hemodialysis catheter-related bloodstream infection. Hemodialysis catheter has been removed on 4/21, repeat blood cultures on 4/21 are negative so far. Infectious disease following, recommendations are to continue intravenous vancomycin. Cardiology contract on 4/22 for TEE-not sure if it can be scheduled for 4/24-MRI of the lumbosacral spine-although incomplete study-does not show discitis/osteomyelitis.   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 Echo shows mildly decreased EF around 40%,. Telemetry negative. Will check lower extremity Doppler. Spoke with cardiologist-Dr. Adella HareAllred-who reviewed chart, did not advise anticoagulate patient in this setting. 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 TEE being planned  Acute systolic heart failure: EF by TTE on 4/19 around 40-45%. Volume removal hemodialysis. Nephrology slowly decreasing dry weight.   Bilateral pleural effusion: Suspect related to CHF. Volume removal with hemodialysis. May need thoracocentesis if no improvement.  Elevated troponin: Likely false-positive elevation in the setting of ESRD-trend is flat not consistent with ACS. However does have known history of CAD. Will plan on medical treatment   Pulmonary hypertension: PA pressure of 60 mmHg via transthoracic echocardiogram, reviewed outpatient echocardiogram (September 2016) in care everywhere-  showed mild pulmonary hypertension. Suspect this is related to underlying Anca vasculitis.   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 Nephrology following   Hypertension:  BP controlled, continue lisinopril and metoprolol-may need to further adjust dosing depending on post dialysis dry weight.  Type 2 diabetes: CBGs stable-continue SSI.   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   Frequent falls: non focal exam-check orthostatics-PT eval  Protein-calorie malnutrition, severe: Continue supplements.   DVT Prophylaxis: Prophylactic Lovenox   Code Status: Full code   Family Communication: None at bedside  Disposition Plan: Remain inpatient-not stable for discharge yet. SNF on discharge when stable  Antimicrobial agents: IV Vanco 4/19>> IV Elita QuickFortaz 4/19>>4/21  Procedures: Echo 4/19-Moderate aortic regurg, ejection fraction  40-45 percent, grade 2 diastolic dysfunction-with right atrial mass 4/21>> hemodialysis catheter removed 4/23>>Ultrasound-guided insertion of Diatek catheter, left internal  jugular vein  CONSULTS:  cardiology  Time spent: 25 minutes-Greater than 50% of this time was spent in counseling, explanation of diagnosis, planning of further management, and coordination of care.  MEDICATIONS: Anti-infectives    Start     Dose/Rate Route Frequency Ordered Stop   04/06/16 0715  vancomycin (VANCOCIN) IVPB 1000 mg/200 mL premix  Status:  Discontinued     1,000 mg 200 mL/hr over 60 Minutes Intravenous To ShortStay [facility] 04/06/16 0616 04/06/16 0621   04/04/16 1200  vancomycin (VANCOCIN) 500 mg in sodium chloride 0.9 % 100 mL IVPB     500 mg 100 mL/hr over 60 Minutes Intravenous Every M-W-F (Hemodialysis) 04/02/16 1630     04/02/16 1800  cefTAZidime (FORTAZ) 2 g in dextrose 5 % 50 mL IVPB  Status:  Discontinued     2 g 100 mL/hr over 30 Minutes Intravenous Every M-W-F (1800) 04/02/16 1624 04/04/16 1148   04/02/16 1630  vancomycin (VANCOCIN) IVPB 1000 mg/200 mL premix     1,000 mg 200 mL/hr over 60 Minutes Intravenous  Once 04/02/16 1623 04/02/16 1740      Scheduled Meds: . albumin human  25 g Intravenous Once  . aspirin EC  81 mg Oral Daily  . citalopram  20 mg Oral Daily  . darbepoetin (ARANESP) injection - DIALYSIS  60 mcg Intravenous Q Mon-HD  . enoxaparin (LOVENOX) injection  30 mg Subcutaneous Q24H  . feeding supplement  1 Container Oral BID BM  . feeding supplement (PRO-STAT SUGAR FREE 64)  30 mL Oral Q2000  . fentaNYL      . folic acid  1 mg Oral Daily  . insulin aspart  0-9 Units Subcutaneous TID WC  . lisinopril  10 mg Oral Daily  . metoprolol succinate  25 mg Oral Daily  . pantoprazole  20 mg Oral Daily  . sodium chloride flush  3 mL Intravenous Q12H  . vancomycin  500 mg Intravenous Q M,W,F-HD   Continuous Infusions:  PRN Meds:.acetaminophen **OR** acetaminophen,  ALPRAZolam, alteplase, heparin, heparin, morphine injection, ondansetron **OR** ondansetron (ZOFRAN) IV, oxyCODONE, oxyCODONE-acetaminophen, senna-docusate, zolpidem   PHYSICAL EXAM: Vital signs: Filed Vitals:   04/06/16 0915 04/06/16 0930 04/06/16 0949 04/06/16 1010  BP: 147/65 146/62 157/72 158/72  Pulse: 65 64 70 71  Temp:  98.7 F (37.1 C) 98.2 F (36.8 C) 98.2 F (36.8 C)  TempSrc:  Oral Oral Oral  Resp: Height:      Weight:      SpO2: 97% 93% 99% 93%   Filed Weights   04/04/16 0305 04/04/16 0637 04/05/16 0600  Weight: 56 kg (123 lb 7.3 oz) 53.6 kg (118 lb 2.7 oz) 53.7 kg (118 lb 6.2 oz)   Body mass index is 17.47 kg/(m^2).   Gen Exam: Awake and alert with clear speech. Not in any distress  Neck: Supple, No JVD.  Chest: B/L Clear.   CVS: S1 S2 Regular Abdomen: soft, BS +, non tender, non distended.  Extremities: no edema, lower extremities warm to touch. Neurologic: Non Focal.   Skin: No Rash or lesions   Wounds: N/A.    LABORATORY DATA: CBC:  Recent Labs Lab 03/30/16 1649 03/31/16 0452 04/04/16 0555 04/04/16 1217 04/06/16 0530  WBC 7.6 6.4 4.2 3.9* 5.6  HGB 9.0* 9.0* 9.5* 9.3* 9.2*  HCT 27.8* 28.0* 29.0* 29.1* 28.6*  MCV 88.3 87.5 89.8 90.1 89.9  PLT 182 180 112* 92* 135*    Basic Metabolic Panel:  Recent Labs Lab  03/30/16 1649 03/31/16 0452 04/01/16 0559 04/02/16 1350 04/04/16 0703 04/06/16 0530  NA  --  138 135 135 133* 135  K  --  2.9* 4.4 5.9* 4.4 3.9  CL  --  97* 99* 101 96* 99*  CO2  --  31 28 28 28 26   GLUCOSE  --  115* 149* 134* 159* 97  BUN  --  17 11 19 19 14   CREATININE 3.42* 3.72* 2.01* 3.14* 3.12* 2.91*  2.92*  CALCIUM  --  8.0* 7.4* 8.1* 7.7* 7.9*  MG 1.7  --   --   --   --   --   PHOS 3.1  --   --   --  2.6 1.9*    GFR: Estimated Creatinine Clearance: 17.2 mL/min (by C-G formula based on Cr of 2.91).  Liver Function Tests:  Recent Labs Lab 04/04/16 0703 04/06/16 0530  ALBUMIN 2.0* 2.1*   No results  for input(s): LIPASE, AMYLASE in the last 168 hours. No results for input(s): AMMONIA in the last 168 hours.  Coagulation Profile: No results for input(s): INR, PROTIME in the last 168 hours.  Cardiac Enzymes:  Recent Labs Lab 03/30/16 2138 03/31/16 0452  TROPONINI 0.51* 0.49*    BNP (last 3 results) No results for input(s): PROBNP in the last 8760 hours.  HbA1C: No results for input(s): HGBA1C in the last 72 hours.  CBG:  Recent Labs Lab 04/05/16 2044 04/06/16 0624 04/06/16 0843 04/06/16 1007 04/06/16 1145  GLUCAP 91 93 81 94 85    Lipid Profile: No results for input(s): CHOL, HDL, LDLCALC, TRIG, CHOLHDL, LDLDIRECT in the last 72 hours.  Thyroid Function Tests: No results for input(s): TSH, T4TOTAL, FREET4, T3FREE, THYROIDAB in the last 72 hours.  Anemia Panel: No results for input(s): VITAMINB12, FOLATE, FERRITIN, TIBC, IRON, RETICCTPCT in the last 72 hours.  Urine analysis:    Component Value Date/Time   COLORURINE YELLOW 03/31/2016 0858   APPEARANCEUR CLOUDY* 03/31/2016 0858   LABSPEC 1.030 03/31/2016 0858   PHURINE 6.0 03/31/2016 0858   GLUCOSEU 250* 03/31/2016 0858   HGBUR MODERATE* 03/31/2016 0858   BILIRUBINUR MODERATE* 03/31/2016 0858   KETONESUR 15* 03/31/2016 0858   PROTEINUR >300* 03/31/2016 0858   NITRITE NEGATIVE 03/31/2016 0858   LEUKOCYTESUR NEGATIVE 03/31/2016 0858    Sepsis Labs: Lactic Acid, Venous    Component Value Date/Time   LATICACIDVEN 0.79 03/30/2016 1703    MICROBIOLOGY: Recent Results (from the past 240 hour(s))  MRSA PCR Screening     Status: None   Collection Time: 03/30/16  6:37 PM  Result Value Ref Range Status   MRSA by PCR NEGATIVE NEGATIVE Final    Comment:        The GeneXpert MRSA Assay (FDA approved for NASAL specimens only), is one component of a comprehensive MRSA colonization surveillance program. It is not intended to diagnose MRSA infection nor to guide or monitor treatment for MRSA  infections.   Urine culture     Status: None   Collection Time: 03/31/16  8:58 AM  Result Value Ref Range Status   Specimen Description URINE, CLEAN CATCH  Final   Special Requests NONE  Final   Culture MULTIPLE SPECIES PRESENT, SUGGEST RECOLLECTION  Final   Report Status 04/01/2016 FINAL  Final  Culture, blood (routine x 2)     Status: Abnormal   Collection Time: 04/02/16  4:05 PM  Result Value Ref Range Status   Specimen Description BLOOD HEMODIALYSIS LINE  Final  Special Requests BOTTLES DRAWN AEROBIC AND ANAEROBIC 10CC  Final   Culture  Setup Time   Final    GRAM POSITIVE COCCI IN CLUSTERS IN BOTH AEROBIC AND ANAEROBIC BOTTLES CRITICAL RESULT CALLED TO, READ BACK BY AND VERIFIED WITH: Patsey Berthold RN 13:15 04/03/16 ([email or web address])    Culture STAPHYLOCOCCUS SPECIES (COAGULASE NEGATIVE) (A)  Final   Report Status 04/05/2016 FINAL  Final   Organism ID, Bacteria STAPHYLOCOCCUS SPECIES (COAGULASE NEGATIVE)  Final      Susceptibility   Staphylococcus species (coagulase negative) - MIC*    CIPROFLOXACIN >=8 RESISTANT Resistant     ERYTHROMYCIN <=0.25 SENSITIVE Sensitive     GENTAMICIN <=0.5 SENSITIVE Sensitive     OXACILLIN >=4 RESISTANT Resistant     TETRACYCLINE <=1 SENSITIVE Sensitive     VANCOMYCIN 1 SENSITIVE Sensitive     TRIMETH/SULFA 160 RESISTANT Resistant     CLINDAMYCIN <=0.25 SENSITIVE Sensitive     RIFAMPIN <=0.5 SENSITIVE Sensitive     Inducible Clindamycin NEGATIVE Sensitive     * STAPHYLOCOCCUS SPECIES (COAGULASE NEGATIVE)  Culture, blood (routine x 2)     Status: Abnormal   Collection Time: 04/02/16  4:35 PM  Result Value Ref Range Status   Specimen Description BLOOD RIGHT ARM  Final   Special Requests   Final    BOTTLES DRAWN AEROBIC AND ANAEROBIC 4CC ANA 5CC AER   Culture  Setup Time   Final    GRAM POSITIVE COCCI IN CLUSTERS IN BOTH AEROBIC AND ANAEROBIC BOTTLES CRITICAL RESULT CALLED TO, READ BACK BY AND VERIFIED WITH: Patsey Berthold RN 16:45 04/03/16 ([email or web address])     Culture (A)  Final    STAPHYLOCOCCUS SPECIES (COAGULASE NEGATIVE) SUSCEPTIBILITIES PERFORMED ON PREVIOUS CULTURE WITHIN THE LAST 5 DAYS.    Report Status 04/05/2016 FINAL  Final  Culture, blood (Routine X 2) w Reflex to ID Panel     Status: None (Preliminary result)   Collection Time: 04/04/16  3:28 PM  Result Value Ref Range Status   Specimen Description BLOOD RIGHT HAND  Final   Special Requests IN PEDIATRIC BOTTLE 0.5CC  Final   Culture NO GROWTH 2 DAYS  Final   Report Status PENDING  Incomplete  Cath Tip Culture     Status: None (Preliminary result)   Collection Time: 04/04/16  5:19 PM  Result Value Ref Range Status   Specimen Description CATH TIP RIGHT NECK  Final   Special Requests NONE  Final   Culture   Final    NO GROWTH 1 DAY Performed at [facility]    Report Status PENDING  Incomplete    RADIOLOGY STUDIES/RESULTS: Dg Chest 1 View  03/30/2016  CLINICAL DATA:  Syncopal episode EXAM: CHEST 1 VIEW COMPARISON:  02/28/2016 FINDINGS: Cardiac shadow is stable. Right jugular dialysis catheter is again seen and stable. The lungs are well aerated bilaterally. No focal infiltrate or sizable effusion is seen. No significant edema is seen. No bony abnormality is noted. IMPRESSION: No active disease. Electronically Signed   By: Alcide Clever M.D.   On: 03/30/2016 16:43   Dg Chest 2 View  04/03/2016  CLINICAL DATA:  Short of breath today.  Pleural effusions. EXAM: CHEST  2 VIEW COMPARISON:  Chest CT, 04/01/2016.  Chest radiograph, 03/30/2016. FINDINGS: Cardiac silhouette is normal in size and configuration. No mediastinal or hilar masses or pathologically enlarged lymph nodes. Right internal jugular dual-lumen tunneled central venous catheter has its tip projecting in the right atrium. This is stable. There are small, right  greater than left, pleural effusions. These appear smaller than noted on the recent prior CT. There is no convincing pneumonia or pulmonary edema. No  pneumothorax. IMPRESSION: 1. Small, right greater left, pleural effusions which appears smaller than they were on the recent prior chest CT. 2. No evidence of pneumonia or pulmonary edema. Electronically Signed   By: Amie Portland M.D.   On: 04/03/2016 12:44   Dg Thoracic Spine 2 View  03/30/2016  CLINICAL DATA:  Upper and mid back pain after falling today. EXAM: THORACIC SPINE 2 VIEWS COMPARISON:  02/28/2016 chest radiograph FINDINGS: Right-sided dialysis catheter with tip at cavoatrial junction or high right atrium. Lateral view images from approximately the bottom of C6 through the bottom of T11. Moderate spondylosis across these levels, without vertebral body height loss. Lower thoracic vertebral body height maintained on lumbar spine radiographs, dictated separately. IMPRESSION: Spondylosis, without acute finding in the thoracic spine. Electronically Signed   By: Jeronimo Greaves M.D.   On: 03/30/2016 16:44   Dg Lumbar Spine Complete  03/30/2016  CLINICAL DATA:  Patient status post fall. Mid and lower back pain. Initial encounter. EXAM: LUMBAR SPINE - COMPLETE 4+ VIEW COMPARISON:  None. FINDINGS: Mild leftward curvature of the lumbar spine. Preservation of the vertebral body and intervertebral disc space heights. SI joints are unremarkable. No evidence for acute lumbar spine fracture. Lower lumbar spine degenerative disc and facet disease. IMPRESSION: Degenerative disc disease.  No acute osseous abnormality. Electronically Signed   By: Annia Belt M.D.   On: 03/30/2016 16:43   Ct Head Wo Contrast  03/30/2016  CLINICAL DATA:  Initial encounter for MULTIPLE RECENT FALLS, TODAY PT. FELL BUT DENIES LOC, GENERALIZED WEAKNESS, HX ESRD-DM-SYNCOPE-HTN-CAD, LW EXAM: CT HEAD WITHOUT CONTRAST CT CERVICAL SPINE WITHOUT CONTRAST TECHNIQUE: Multidetector CT imaging of the head and cervical spine was performed following the standard protocol without intravenous contrast. Multiplanar CT image reconstructions of the  cervical spine were also generated. COMPARISON:  Head CT of 12/26/2015. Prior head and cervical spine CTs 12/25/2015. FINDINGS: CT HEAD FINDINGS Sinuses/Soft tissues: No significant soft tissue swelling. Mild ethmoid air cell mucosal thickening. No skull fracture. Clear mastoid air cells. Intracranial: Marked improvement to resolution of previously described subdural collection adjacent the left frontal lobe. Minimal prominence of extra-axial spaces remains, including image 17/series 201. Hypo attenuating. Bilateral vertebral artery atherosclerosis. Moderate low density in the periventricular white matter likely related to small vessel disease. Resolved right-sided extra-axial/subdural fluid collection. 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 No acute large vessel cortically based infarct. No new hemorrhage, intra-axial, or extra-axial fluid collection. No mass lesion. CT CERVICAL SPINE FINDINGS Spinal visualization through the bottom of T2. Prevertebral soft tissues are within normal limits. Left worse than right carotid atherosclerosis. A right-sided central line is incompletely imaged. Small right pleural effusion is similar to 12/25/2015. No apical pneumothorax. Base interlobular septal thickening which suggests interstitial edema. Skull base intact.  Skull base intact. Trace C2-3 anterolisthesis is similar and likely degenerative. Maintenance of vertebral body height. Straightening of expected lordosis. Facets are well-aligned. Multilevel spondylosis, with loss of intervertebral disc height and endplate osteophyte formation. C1-2 articulation demonstrates only degenerative change on coronal reformats. IMPRESSION: 1. No acute intracranial abnormality. Cerebral atrophy is small vessel ischemic change. 2. Resolved right and markedly improved to resolved left-sided subdural fluid collections. There is minimal residual prominence of the left extra-axial space adjacent the frontal lobe. 3. Cervical  spondylosis, without acute fracture or subluxation. Straightening of expected cervical lordosis could be  positional, due to muscular spasm, or ligamentous injury. 4. Similar small right pleural effusion. Suspect interstitial edema/congestive heart failure. Electronically Signed   By: Jeronimo Greaves M.D.   On: 03/30/2016 17:36   Ct Chest Wo Contrast  04/01/2016  CLINICAL DATA:  Shortness of breath today in patient admitted to the hospital with a chief complaint of syncope on 03/30/2016. The patient denies cough. End-stage renal disease. Initial encounter. EXAM: CT CHEST WITHOUT CONTRAST TECHNIQUE: Multidetector CT imaging of the chest was performed following the standard protocol without IV contrast. COMPARISON:  PA and lateral chest 02/28/2016. FINDINGS: The patient has moderate bilateral pleural effusions, greater on the right. Mild cardiomegaly is seen. Small pericardial effusion is identified. Extensive calcific coronary artery disease is present. Dialysis catheter from a right IJ approach is noted. There is no axillary, hilar or mediastinal lymphadenopathy. The lungs demonstrate mild compressive atelectasis secondary to the patient's pleural effusions, slightly greater on the right. There is also some interlobular septal thickening in the upper lobes which is worse on the left. The lungs are otherwise unremarkable. Visualized upper abdomen demonstrates extensive atherosclerosis. No focal abnormality is seen. No focal bony abnormality is identified. IMPRESSION: Moderate bilateral pleural effusions, larger on the right. Interlobular septal thickening in the upper lobes bilaterally is compatible with mild edema. Small pericardial effusion. Extensive calcific coronary artery disease. Electronically Signed   By: Drusilla Kanner M.D.   On: 04/01/2016 13:15   Ct Cervical Spine Wo Contrast  03/30/2016  CLINICAL DATA:  Initial encounter for MULTIPLE RECENT FALLS, TODAY PT. FELL BUT DENIES LOC, GENERALIZED WEAKNESS,  HX ESRD-DM-SYNCOPE-HTN-CAD, LW EXAM: CT HEAD WITHOUT CONTRAST CT CERVICAL SPINE WITHOUT CONTRAST TECHNIQUE: Multidetector CT imaging of the head and cervical spine was performed following the standard protocol without intravenous contrast. Multiplanar CT image reconstructions of the cervical spine were also generated. COMPARISON:  Head CT of 12/26/2015. Prior head and cervical spine CTs 12/25/2015. FINDINGS: CT HEAD FINDINGS Sinuses/Soft tissues: No significant soft tissue swelling. Mild ethmoid air cell mucosal thickening. No skull fracture. Clear mastoid air cells. Intracranial: Marked improvement to resolution of previously described subdural collection adjacent the left frontal lobe. Minimal prominence of extra-axial spaces remains, including image 17/series 201. Hypo attenuating. Bilateral vertebral artery atherosclerosis. Moderate low density in the periventricular white matter likely related to small vessel disease. Resolved right-sided extra-axial/subdural fluid collection. 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 No acute large vessel cortically based infarct. No new hemorrhage, intra-axial, or extra-axial fluid collection. No mass lesion. CT CERVICAL SPINE FINDINGS Spinal visualization through the bottom of T2. Prevertebral soft tissues are within normal limits. Left worse than right carotid atherosclerosis. A right-sided central line is incompletely imaged. Small right pleural effusion is similar to 12/25/2015. No apical pneumothorax. Base interlobular septal thickening which suggests interstitial edema. Skull base intact.  Skull base intact. Trace C2-3 anterolisthesis is similar and likely degenerative. Maintenance of vertebral body height. Straightening of expected lordosis. Facets are well-aligned. Multilevel spondylosis, with loss of intervertebral disc height and endplate osteophyte formation. C1-2 articulation demonstrates only degenerative change on coronal reformats. IMPRESSION: 1. No  acute intracranial abnormality. Cerebral atrophy is small vessel ischemic change. 2. Resolved right and markedly improved to resolved left-sided subdural fluid collections. There is minimal residual prominence of the left extra-axial space adjacent the frontal lobe. 3. Cervical spondylosis, without acute fracture or subluxation. Straightening of expected cervical lordosis could be positional, due to muscular spasm, or ligamentous injury. 4. Similar small right pleural effusion. Suspect interstitial edema/congestive heart  failure. Electronically Signed   By: Jeronimo Greaves M.D.   On: 03/30/2016 17:36   Mr Lumbar Spine Wo Contrast  04/05/2016  CLINICAL DATA:  75 year old male with end-stage renal disease, back pain since December. Falls. Bacteremia. Initial encounter. EXAM: MRI LUMBAR SPINE WITHOUT CONTRAST TECHNIQUE: Multiplanar, multisequence MR imaging of the lumbar spine was performed. No intravenous contrast was administered. COMPARISON:  Lumbar radiographs 03/30/2016. FINDINGS: Normal lumbar segmentation demonstrated on the comparison. The patient was unable to lie flat. Subsequently The examination had to be discontinued prior to completion. Only sagittal imaging could be obtained, and is motion degraded. Diffusely decreased T1 and T2 bone marrow signal. Abnormal STIR hyperintensity in the L4 inferior endplate and posterior vertebral body (series 4, image 3 and series 3, image 10. There is also mild associated abnormal increased STIR signal in the adjacent L5 superior endplate, but this appears somewhat focal like a Schmorl node. The posterior vertebral body signal abnormality appears somewhat contiguous with the abnormal intervertebral disc region signal. This could represent a leftward disc extrusion extending into the left L4 neural foramen. No evidence of acute osseous abnormality at the other visualized levels. No significant lumbar spinal stenosis. IMPRESSION: 1. The examination had to be discontinued  prior to completion due to the patient's inability to lie flat. 2. Abnormal signal in the L4 disc and adjacent L4-L5 endplates. However, favor this is due to a disc herniation extending into the left L4 neural foramen. Is there lateralizing left hip or lower extremity pain, such as due to left L4 radiculitis? 3. L4-L5 discitis osteomyelitis felt less likely. Repeat lumbar MRI to include axial imaging may be confirmatory when possible. 4. 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 Electronically Signed   By: Odessa Fleming M.D.   On: 04/05/2016 17:46   Dg Chest Port 1 View  04/06/2016  CLINICAL DATA:  Central line placement, status post dialysis catheter insertion EXAM: PORTABLE CHEST 1 VIEW COMPARISON:  Chest radiograph dated 04/05/2016 FINDINGS: Lungs are clear.  No pleural effusion or pneumothorax. The heart is top-normal in size. Left IJ dual lumen dialysis catheter with its distal tip in the upper right atrium. IMPRESSION: Left-sided dual lumen dialysis catheter with its distal tip in the upper right atrium. No pneumothorax. Electronically Signed   By: Charline Bills M.D.   On: 04/06/2016 10:11   Dg Chest Port 1 View  04/05/2016  CLINICAL DATA:  Shortness of Breath EXAM: PORTABLE CHEST 1 VIEW COMPARISON:  04/03/2016 FINDINGS: Cardiac shadow is stable. The previously seen dialysis catheter has been removed in the interval. The lungs are well aerated bilaterally. Mild increased density is noted over both lungs which may be related to small effusions. No focal infiltrate is seen. No bony abnormality is noted. IMPRESSION: Question small effusions. Clinical correlation is recommended. Upright films when the patient is able are also recommended. Electronically Signed   By: Alcide Clever M.D.   On: 04/05/2016 18:26   Dg Fluoro Guide Cv Line-no Report  04/06/2016  CLINICAL DATA:  FLOURO GUIDE CV LINE Fluoroscopy was utilized by the requesting physician.  No radiographic  interpretation.     LOS: 4 days   Jeoffrey Massed, MD  [facility] Pager:[phone]  If 7PM-7AM, please contact night-coverage [email or web address] Password [facility] 04/06/2016, 2:48 PM

## 2016-04-06 NOTE — H&P (View-Only) (Signed)
[facility]  Subjective  - feels ok   Objective 144/58 62 98 F (36.7 C) (Oral) 18 100%  Intake/Output Summary (Last 24 hours) at 04/05/16 1436 Last data filed at 04/05/16 1100  Gross per 24 hour  Intake   2880 ml  Output      0 ml  Net   2880 ml   Right neck no hematoma + thrill left arm av fistula  Assessment/Planning: Plan to replace dialysis catheter in morning Procedure risk benefit discussed with pt NPO p midnight  Fabienne BrunsFields, Charles 04/05/2016 2:36 PM --  Laboratory Lab Results:  Recent Labs  04/04/16 0555 04/04/16 1217  WBC 4.2 3.9*  HGB 9.5* 9.3*  HCT 29.0* 29.1*  PLT 112* 92*   BMET  Recent Labs  04/04/16 0703  NA 133*  K 4.4  CL 96*  CO2 28  GLUCOSE 159*  BUN 19  CREATININE 3.12*  CALCIUM 7.7*    COAG Lab Results  Component Value Date   INR 1.27 03/30/2016   INR 1.29 12/25/2015   No results found for: PTT

## 2016-04-06 NOTE — Interval H&P Note (Signed)
History and Physical Interval Note:  04/06/2016 7:41 AM  Jeffrey Walls  has presented today for surgery, with the diagnosis of ESRD  The various methods of treatment have been discussed with the patient and family. After consideration of risks, benefits and other options for treatment, the patient has consented to  Procedure(s): INSERTION OF DIALYSIS CATHETER (N/A) as a surgical intervention .  The patient's history has been reviewed, patient examined, no change in status, stable for surgery.  I have reviewed the patient's chart and labs.  Questions were answered to the patient's satisfaction.     Fabienne BrunsFields, [name]

## 2016-04-07 ENCOUNTER — Ambulatory Visit (HOSPITAL_COMMUNITY): Payer: Medicare HMO

## 2016-04-07 ENCOUNTER — Encounter (HOSPITAL_COMMUNITY): Payer: Self-pay | Admitting: Vascular Surgery

## 2016-04-07 DIAGNOSIS — N186 End stage renal disease: Secondary | ICD-10-CM | POA: Insufficient documentation

## 2016-04-07 DIAGNOSIS — M5126 Other intervertebral disc displacement, lumbar region: Secondary | ICD-10-CM

## 2016-04-07 DIAGNOSIS — R7989 Other specified abnormal findings of blood chemistry: Secondary | ICD-10-CM

## 2016-04-07 DIAGNOSIS — M4646 Discitis, unspecified, lumbar region: Secondary | ICD-10-CM

## 2016-04-07 LAB — CBC
HEMATOCRIT: 27.2 % — AB (ref 39.0–52.0)
Hemoglobin: 8.5 g/dL — ABNORMAL LOW (ref 13.0–17.0)
MCH: 28.1 pg (ref 26.0–34.0)
MCHC: 31.3 g/dL (ref 30.0–36.0)
MCV: 89.8 fL (ref 78.0–100.0)
Platelets: 123 10*3/uL — ABNORMAL LOW (ref 150–400)
RBC: 3.03 MIL/uL — AB (ref 4.22–5.81)
RDW: 19.3 % — AB (ref 11.5–15.5)
WBC: 4.9 10*3/uL (ref 4.0–10.5)

## 2016-04-07 LAB — GLUCOSE, CAPILLARY
GLUCOSE-CAPILLARY: 86 mg/dL (ref 65–99)
GLUCOSE-CAPILLARY: 82 mg/dL (ref 65–99)
Glucose-Capillary: 94 mg/dL (ref 65–99)
Glucose-Capillary: 113 mg/dL — ABNORMAL HIGH (ref 65–99)

## 2016-04-07 LAB — CATH TIP CULTURE: Culture: NO GROWTH

## 2016-04-07 MED ORDER — SODIUM CHLORIDE 0.9 % IV SOLN: 100.0000 mL | INTRAVENOUS | Status: DC | PRN

## 2016-04-07 MED ORDER — LIDOCAINE-PRILOCAINE 2.5-2.5 % EX CREA: 1.0000 "application " | TOPICAL_CREAM | CUTANEOUS | Status: DC | PRN

## 2016-04-07 MED ORDER — ALTEPLASE 2 MG IJ SOLR: 2.0000 mg | Freq: Once | INTRAMUSCULAR | Status: DC | PRN

## 2016-04-07 MED ORDER — DARBEPOETIN ALFA 100 MCG/0.5ML IJ SOSY
100.0000 ug | PREFILLED_SYRINGE | INTRAMUSCULAR | Status: DC
  Administered 2016-04-07: 100 ug via INTRAVENOUS

## 2016-04-07 MED ORDER — HEPARIN SODIUM (PORCINE) 1000 UNIT/ML DIALYSIS: 1000.0000 [IU] | INTRAMUSCULAR | Status: DC | PRN

## 2016-04-07 MED ORDER — VANCOMYCIN HCL IN DEXTROSE 500-5 MG/100ML-% IV SOLN
INTRAVENOUS | Status: AC
  Administered 2016-04-07: 500 mg
  Filled 2016-04-07: qty 100

## 2016-04-07 MED ORDER — DARBEPOETIN ALFA 100 MCG/0.5ML IJ SOSY
PREFILLED_SYRINGE | INTRAMUSCULAR | Status: AC
  Administered 2016-04-07: 100 ug via INTRAVENOUS
  Filled 2016-04-07: qty 0.5

## 2016-04-07 MED ORDER — LIDOCAINE HCL (PF) 1 % IJ SOLN: 5.0000 mL | INTRAMUSCULAR | Status: DC | PRN

## 2016-04-07 MED ORDER — PENTAFLUOROPROP-TETRAFLUOROETH EX AERO: 1.0000 "application " | INHALATION_SPRAY | CUTANEOUS | Status: DC | PRN

## 2016-04-07 NOTE — Progress Notes (Signed)
PT Cancellation Note  Patient Details Name: Jeffrey Walls MRN: [id number] DOB: 06/01/1941   Cancelled Treatment:    Reason Eval/Treat Not Completed: Patient at procedure or test/unavailable   Currently in HD;  Will follow up later today as time allows;  Otherwise, will follow up for PT tomorrow;   Thank you,  Van ClinesHolly Veverly Larimer, PT  Acute Rehabilitation Services Pager [phone] Office [phone]     Van ClinesGarrigan, Maliha Outten [facility] 04/07/2016, 8:15 AM

## 2016-04-07 NOTE — Progress Notes (Signed)
Could not do TEE today, is scheduled with Dr. Shirlee LatchMcLean for tomorrow.

## 2016-04-07 NOTE — Progress Notes (Signed)
[facility].  Osborne Cascoadia Kaytie Ratcliffe LCSWA [phone]

## 2016-04-07 NOTE — Progress Notes (Signed)
Pharmacy Antibiotic Note  Jeffrey Walls is a 75 y.o. male admitted on 03/30/2016 with syncope.  Abx have been narrowed to Vancomycin only for CoNS bacteremia.  Today is day #6 of therapy.  HD access removed 4/21.  TEE pending.  Plan: Continue vancomycin 500 mg IV qHD on MWF Vancomycin level pre-HD 4/26.   Height: 5\' 9"  (175.3 cm) Weight: 121 lb 11.1 oz (55.2 kg) (Standing scale) IBW/kg (Calculated) : 70.7  Temp (24hrs), Avg:97.9 F (36.6 C), Min:97.6 F (36.4 C), Max:98.2 F (36.8 C)   Recent Labs Lab 04/01/16 0559 04/02/16 1350 04/04/16 0555 04/04/16 0703 04/04/16 1217 04/06/16 0530 04/07/16 0521  WBC  --   --  4.2  --  3.9* 5.6 4.9  CREATININE 2.01* 3.14*  --  3.12*  --  2.91*  2.92*  --     Estimated Creatinine Clearance: 17.7 mL/min (by C-G formula based on Cr of 2.91).    Allergies  Allergen Reactions  . Ampicillin Rash    Ankle swelling  . Benadryl [Diphenhydramine Hcl] Itching    Rash    Antimicrobials this admission: Vanc 4/19 >>  Elita QuickFortaz 4/19 >> 4/19  Dose adjustments this admission:   Microbiology results: 4/21 cath tip: ngtd 4/21 BCx: ngtd 4/19 BCx: 2/2 CoNS 4/19 UCx: multiple species  4/16 MRSA screen neg  Thank you for allowing pharmacy to be a part of this patient's care.  Toys 'R' UsKimberly Sincere Liuzzi, Pharm.D., BCPS Clinical Pharmacist Pager [phone] 04/07/2016 11:45 AM

## 2016-04-07 NOTE — Progress Notes (Signed)
INFECTIOUS DISEASE PROGRESS NOTE  ID: Jeffrey Walls is a 75 y.o. male with  Principal Problem:   Bacteremia, coagulase-negative staphylococcal Active Problems:   ESRD on hemodialysis (HCC)   Anemia of renal disease   Frequent falls   Essential hypertension   Diabetes mellitus with diabetic nephropathy, with long-term current use of insulin (HCC)   Protein-calorie malnutrition, severe   Syncope   Elevated troponin   Acute combined systolic and diastolic heart failure (HCC)  Subjective: Feels same, poorly.   Abtx:  Anti-infectives    Start     Dose/Rate Route Frequency Ordered Stop   04/07/16 1003  vancomycin (VANCOCIN) 500 MG/100ML IVPB    Comments:  [name], [name]   : cabinet override      04/07/16 1003 04/07/16 1034   04/06/16 0715  vancomycin (VANCOCIN) IVPB 1000 mg/200 mL premix  Status:  Discontinued     1,000 mg 200 mL/hr over 60 Minutes Intravenous To ShortStay Surgical 04/06/16 0616 04/06/16 0621   04/04/16 1200  vancomycin (VANCOCIN) 500 mg in sodium chloride 0.9 % 100 mL IVPB     500 mg 100 mL/hr over 60 Minutes Intravenous Every M-W-F (Hemodialysis) 04/02/16 1630     04/02/16 1800  cefTAZidime (FORTAZ) 2 g in dextrose 5 % 50 mL IVPB  Status:  Discontinued     2 g 100 mL/hr over 30 Minutes Intravenous Every M-W-F (1800) 04/02/16 1624 04/04/16 1148   04/02/16 1630  vancomycin (VANCOCIN) IVPB 1000 mg/200 mL premix     1,000 mg 200 mL/hr over 60 Minutes Intravenous  Once 04/02/16 1623 04/02/16 1740      Medications:  Scheduled: . albumin human  25 g Intravenous Once  . aspirin EC  81 mg Oral Daily  . citalopram  20 mg Oral Daily  . darbepoetin (ARANESP) injection - DIALYSIS  100 mcg Intravenous Q Mon-HD  . enoxaparin (LOVENOX) injection  30 mg Subcutaneous Q24H  . feeding supplement  1 Container Oral BID BM  . feeding supplement (PRO-STAT SUGAR FREE 64)  30 mL Oral Q2000  . folic acid  1 mg Oral Daily  . insulin aspart  0-9 Units Subcutaneous TID WC  .  lisinopril  10 mg Oral Daily  . metoprolol succinate  25 mg Oral Daily  . pantoprazole  20 mg Oral Daily  . sodium chloride flush  3 mL Intravenous Q12H  . vancomycin  500 mg Intravenous Q M,W,F-HD    Objective: Vital signs in last 24 hours: Temp:  [97.6 F (36.4 C)-98.2 F (36.8 C)] 97.7 F (36.5 C) (04/24 1143) Pulse Rate:  [63-81] 81 (04/24 1143) Resp:  [10-22] 20 (04/24 1143) BP: (95-174)/(53-92) 174/92 mmHg (04/24 1143) SpO2:  [93 %-100 %] 100 % (04/24 1143) Weight:  [52.3 kg (115 lb 4.8 oz)-55.9 kg (123 lb 3.8 oz)] 52.3 kg (115 lb 4.8 oz) (04/24 1143)   General appearance: alert, cooperative and no distress Resp: rhonchi bilaterally Chest wall: no tenderness, HD line wound site is clean, no d/c.  Cardio: regular rate and rhythm GI: normal findings: bowel sounds normal and soft, non-tender  No LE edema  Lab Results  Recent Labs  04/06/16 0530 04/07/16 0521  WBC 5.6 4.9  HGB 9.2* 8.5*  HCT 28.6* 27.2*  NA 135  --   K 3.9  --   CL 99*  --   CO2 26  --   BUN 14  --   CREATININE 2.91*  2.92*  --    Liver Panel  Recent Labs  04/06/16 0530  ALBUMIN 2.1*   Sedimentation Rate No results for input(s): ESRSEDRATE in the last 72 hours. C-Reactive Protein No results for input(s): CRP in the last 72 hours.  Microbiology: Recent Results (from the past 240 hour(s))  MRSA PCR Screening     Status: None   Collection Time: 03/30/16  6:37 PM  Result Value Ref Range Status   MRSA by PCR NEGATIVE NEGATIVE Final    Comment:        The GeneXpert MRSA Assay (FDA approved for NASAL specimens only), is one component of a comprehensive MRSA colonization surveillance program. It is not intended to diagnose MRSA infection nor to guide or monitor treatment for MRSA infections.   Urine culture     Status: None   Collection Time: 03/31/16  8:58 AM  Result Value Ref Range Status   Specimen Description URINE, CLEAN CATCH  Final   Special Requests NONE  Final   Culture  MULTIPLE SPECIES PRESENT, SUGGEST RECOLLECTION  Final   Report Status 04/01/2016 FINAL  Final  Culture, blood (routine x 2)     Status: Abnormal   Collection Time: 04/02/16  4:05 PM  Result Value Ref Range Status   Specimen Description BLOOD HEMODIALYSIS LINE  Final   Special Requests BOTTLES DRAWN AEROBIC AND ANAEROBIC 10CC  Final   Culture  Setup Time   Final    GRAM POSITIVE COCCI IN CLUSTERS IN BOTH AEROBIC AND ANAEROBIC BOTTLES CRITICAL RESULT CALLED TO, READ BACK BY AND VERIFIED WITH: Patsey Berthold. Eckelmann RN 13:15 04/03/16 ([email or web address])    Culture STAPHYLOCOCCUS SPECIES (COAGULASE NEGATIVE) (A)  Final   Report Status 04/05/2016 FINAL  Final   Organism ID, Bacteria STAPHYLOCOCCUS SPECIES (COAGULASE NEGATIVE)  Final      Susceptibility   Staphylococcus species (coagulase negative) - MIC*    CIPROFLOXACIN >=8 RESISTANT Resistant     ERYTHROMYCIN <=0.25 SENSITIVE Sensitive     GENTAMICIN <=0.5 SENSITIVE Sensitive     OXACILLIN >=4 RESISTANT Resistant     TETRACYCLINE <=1 SENSITIVE Sensitive     VANCOMYCIN 1 SENSITIVE Sensitive     TRIMETH/SULFA 160 RESISTANT Resistant     CLINDAMYCIN <=0.25 SENSITIVE Sensitive     RIFAMPIN <=0.5 SENSITIVE Sensitive     Inducible Clindamycin NEGATIVE Sensitive     * STAPHYLOCOCCUS SPECIES (COAGULASE NEGATIVE)  Culture, blood (routine x 2)     Status: Abnormal   Collection Time: 04/02/16  4:35 PM  Result Value Ref Range Status   Specimen Description BLOOD RIGHT ARM  Final   Special Requests   Final    BOTTLES DRAWN AEROBIC AND ANAEROBIC 4CC ANA 5CC AER   Culture  Setup Time   Final    GRAM POSITIVE COCCI IN CLUSTERS IN BOTH AEROBIC AND ANAEROBIC BOTTLES CRITICAL RESULT CALLED TO, READ BACK BY AND VERIFIED WITH: Patsey Berthold. Eckelmann RN 16:45 04/03/16 ([email or web address])    Culture (A)  Final    STAPHYLOCOCCUS SPECIES (COAGULASE NEGATIVE) SUSCEPTIBILITIES PERFORMED ON PREVIOUS CULTURE WITHIN THE LAST 5 DAYS.    Report Status 04/05/2016 FINAL  Final  Culture, blood (Routine  X 2) w Reflex to ID Panel     Status: None (Preliminary result)   Collection Time: 04/04/16  3:28 PM  Result Value Ref Range Status   Specimen Description BLOOD RIGHT HAND  Final   Special Requests IN PEDIATRIC BOTTLE 0.5CC  Final   Culture NO GROWTH 3 DAYS  Final   Report Status PENDING  Incomplete  Cath Tip Culture  Status: None   Collection Time: 04/04/16  5:19 PM  Result Value Ref Range Status   Specimen Description CATH TIP RIGHT NECK  Final   Special Requests NONE  Final   Culture   Final    NO GROWTH 2 DAYS Performed at [facility]    Report Status 04/07/2016 FINAL  Final    Studies/Results: Mr Lumbar Spine Wo Contrast  04/05/2016  CLINICAL DATA:  75 year old male with end-stage renal disease, back pain since December. Falls. Bacteremia. Initial encounter. EXAM: MRI LUMBAR SPINE WITHOUT CONTRAST TECHNIQUE: Multiplanar, multisequence MR imaging of the lumbar spine was performed. No intravenous contrast was administered. COMPARISON:  Lumbar radiographs 03/30/2016. FINDINGS: Normal lumbar segmentation demonstrated on the comparison. The patient was unable to lie flat. Subsequently The examination had to be discontinued prior to completion. Only sagittal imaging could be obtained, and is motion degraded. Diffusely decreased T1 and T2 bone marrow signal. Abnormal STIR hyperintensity in the L4 inferior endplate and posterior vertebral body (series 4, image 3 and series 3, image 10. There is also mild associated abnormal increased STIR signal in the adjacent L5 superior endplate, but this appears somewhat focal like a Schmorl node. The posterior vertebral body signal abnormality appears somewhat contiguous with the abnormal intervertebral disc region signal. This could represent a leftward disc extrusion extending into the left L4 neural foramen. No evidence of acute osseous abnormality at the other visualized levels. No significant lumbar spinal stenosis. IMPRESSION: 1. The  examination had to be discontinued prior to completion due to the patient's inability to lie flat. 2. Abnormal signal in the L4 disc and adjacent L4-L5 endplates. However, favor this is due to a disc herniation extending into the left L4 neural foramen. Is there lateralizing left hip or lower extremity pain, such as due to left L4 radiculitis? 3. L4-L5 discitis osteomyelitis felt less likely. Repeat lumbar MRI to include axial imaging may be confirmatory when possible. 4. 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 Electronically Signed   By: Odessa Fleming M.D.   On: 04/05/2016 17:46   Dg Chest Port 1 View  04/06/2016  CLINICAL DATA:  Central line placement, status post dialysis catheter insertion EXAM: PORTABLE CHEST 1 VIEW COMPARISON:  Chest radiograph dated 04/05/2016 FINDINGS: Lungs are clear.  No pleural effusion or pneumothorax. The heart is top-normal in size. Left IJ dual lumen dialysis catheter with its distal tip in the upper right atrium. IMPRESSION: Left-sided dual lumen dialysis catheter with its distal tip in the upper right atrium. No pneumothorax. Electronically Signed   By: Charline Bills M.D.   On: 04/06/2016 10:11   Dg Chest Port 1 View  04/05/2016  CLINICAL DATA:  Shortness of Breath EXAM: PORTABLE CHEST 1 VIEW COMPARISON:  04/03/2016 FINDINGS: Cardiac shadow is stable. The previously seen dialysis catheter has been removed in the interval. The lungs are well aerated bilaterally. Mild increased density is noted over both lungs which may be related to small effusions. No focal infiltrate is seen. No bony abnormality is noted. IMPRESSION: Question small effusions. Clinical correlation is recommended. Upright films when the patient is able are also recommended. Electronically Signed   By: Alcide Clever M.D.   On: 04/05/2016 18:26   Dg Fluoro Guide Cv Line-no Report  04/06/2016  CLINICAL DATA:  FLOURO GUIDE CV LINE Fluoroscopy was utilized by the requesting  physician.  No radiographic interpretation.     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

## 2016-04-07 NOTE — Progress Notes (Signed)
                       PROGRESS NOTE        PATIENT DETAILS Name: Jeffrey Walls Age: 75 y.o. Sex: male Date of Birth: 02/28/1941 Admit Date: 03/30/2016 Admitting Physician Phillip M Hobbs, MD PCP:HADLEY,ALEXANDER, MD  Brief Narrative: Patient is a 75 y.o. 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 Hospital course complicated by development of acute systolic heart failure and fever on 4/19 with blood cultures positive for coag-negative staph-felt to be secondary to dialysis catheter related blood stream infection.Also found to have a right atrial mass on transthoracic echocardiogram on 4/19 Plans are for TEE 4/24.Hemodialysis catheter has been removed by vascular surgery. See below for further details.   Subjective: Seen at HD-claims back pain is well controlled. No SOB.   Assessment/Plan: Principal Problem: Bacteremia, coagulase-negative staphylococcal: Probably secondary to hemodialysis catheter-related bloodstream infection. Hemodialysis catheter has been removed on 4/21, repeat blood cultures on 4/21 are negative so far. Infectious disease following, recommendations are to continue intravenous vancomycin.  TEE scheduled for 4/24.MRI of the lumbosacral spine-although incomplete study-does not show discitis/osteomyelitis.   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 Echo shows mildly decreased EF around 40%. Lower Ext Doppler negative.Telemetry negative. Spoke with cardiologist-Dr. Allred-who reviewed chart, did not advise anticoagulate patient in this setting. 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 TEE being planned for 4/24  Acute systolic heart failure: EF by TTE on 4/19 around 40-45%. Volume removal hemodialysis. Nephrology slowly decreasing dry weight.   Bilateral pleural effusion: Suspect related to CHF. Volume removal with hemodialysis. Doubt needs thoracocentesis at this point-as chest x-ray on 4/23 demonstrates no pleural effusion.  Elevated troponin: Likely false-positive elevation in the setting of ESRD-trend is flat not consistent with ACS. However does have known history of CAD. Will plan on medical treatment   Pulmonary hypertension: PA pressure of 60 mmHg via transthoracic echocardiogram, reviewed outpatient echocardiogram (September 2016) in care everywhere-  showed mild pulmonary hypertension. Suspect this is related to underlying Anca vasculitis.   ESRD: 2/ MPO + Anca associated vasculitis-treated at [facility] now off all immunosuppressants.On hemodialysis MWF. Nephrology following   Hypertension:  BP controlled, continue lisinopril and metoprolol-may need to further adjust dosing depending on post dialysis dry weight.  Type 2 diabetes: CBGs stable-continue SSI.   Back pain:  Given bacteremia- MRI lumbosacral spine done on 4/22-although incomplete study-no discitis/osteomyelitis-but does show disc herniation at L4-L5. Continue supportive care  Anemia of renal disease: Management as per nephrology  Thrombocytopenia:Could be related to bacteremia, improving.   Frequent falls: non focal exam-check orthostatics-PT eval  Protein-calorie malnutrition, severe: Continue supplements.   DVT Prophylaxis: Prophylactic Lovenox   Code Status: Full code   Family Communication: None at bedside  Disposition Plan: Remain inpatient- SNF on discharge when stable/work up complete  Antimicrobial agents: IV Vanco 4/19>> IV Fortaz 4/19>>4/21  Procedures: Echo 4/19>>Moderate aortic regurg, ejection fraction 40-45 percent, grade 2 diastolic dysfunction-with right  atrial mass 4/21>> hemodialysis catheter removed 4/23>>Ultrasound-guided insertion of Diatek catheter, left internal jugular vein  CONSULTS:  cardiology  Time spent: 25 minutes-Greater than 50%   of this time was spent in counseling, explanation of diagnosis, planning of further management, and coordination of care.  MEDICATIONS: Anti-infectives    Start     Dose/Rate Route Frequency Ordered Stop   04/07/16 1003  vancomycin (VANCOCIN) 500 MG/100ML IVPB    Comments:  [name], [name]   : cabinet override      04/07/16 1003 04/07/16 1034   04/06/16 0715  vancomycin (VANCOCIN) IVPB 1000 mg/200 mL premix  Status:  Discontinued     1,000 mg 200 mL/hr over 60 Minutes Intravenous To ShortStay [facility] 04/06/16 0616 04/06/16 0621   04/04/16 1200  vancomycin (VANCOCIN) 500 mg in sodium chloride 0.9 % 100 mL IVPB     500 mg 100 mL/hr over 60 Minutes Intravenous Every M-W-F (Hemodialysis) 04/02/16 1630     04/02/16 1800  cefTAZidime (FORTAZ) 2 g in dextrose 5 % 50 mL IVPB  Status:  Discontinued     2 g 100 mL/hr over 30 Minutes Intravenous Every M-W-F (1800) 04/02/16 1624 04/04/16 1148   04/02/16 1630  vancomycin (VANCOCIN) IVPB 1000 mg/200 mL premix     1,000 mg 200 mL/hr over 60 Minutes Intravenous  Once 04/02/16 1623 04/02/16 1740      Scheduled Meds: . albumin human  25 g Intravenous Once  . aspirin EC  81 mg Oral Daily  . citalopram  20 mg Oral Daily  . darbepoetin (ARANESP) injection - DIALYSIS  100 mcg Intravenous Q Mon-HD  . enoxaparin (LOVENOX) injection  30 mg Subcutaneous Q24H  . feeding supplement  1 Container Oral BID BM  . feeding supplement (PRO-STAT SUGAR FREE 64)  30 mL Oral Q2000  . folic acid  1 mg Oral Daily  . insulin aspart  0-9 Units Subcutaneous TID WC  . lisinopril  10 mg Oral Daily  . metoprolol succinate  25 mg Oral Daily  . pantoprazole  20 mg Oral Daily  . sodium chloride flush  3 mL Intravenous Q12H  . vancomycin  500 mg Intravenous Q M,W,F-HD   Continuous  Infusions:  PRN Meds:.sodium chloride, sodium chloride, acetaminophen **OR** acetaminophen, ALPRAZolam, alteplase, heparin, lidocaine (PF), lidocaine-prilocaine, morphine injection, ondansetron **OR** ondansetron (ZOFRAN) IV, oxyCODONE, oxyCODONE-acetaminophen, pentafluoroprop-tetrafluoroeth, senna-docusate, zolpidem   PHYSICAL EXAM: Vital signs: Filed Vitals:   04/07/16 0928 04/07/16 0954 04/07/16 1026 04/07/16 1055  BP: 125/68 97/58 124/68 95/59  Pulse: 69 68 73 69  Temp:      TempSrc:      Resp: 12 16 16 11  Height:      Weight:      SpO2:       Filed Weights   04/05/16 0600 04/07/16 0537 04/07/16 0745  Weight: 53.7 kg (118 lb 6.2 oz) 55.9 kg (123 lb 3.8 oz) 55.2 kg (121 lb 11.1 oz)   Body mass index is 17.96 kg/(m^2).   Gen Exam: Awake and alert with clear speech. Not in any distress  Neck: Supple, No JVD.  Chest: B/L Clear. No rales or rhonchi  CVS: S1 S2 Regular Abdomen: soft, BS +, non tender, non distended.  Extremities: no edema, lower extremities warm to touch. Neurologic: Non Focal.   Skin: No Rash or lesions   Wounds: N/A.    LABORATORY DATA: CBC:  Recent Labs Lab 04/04/16 0555 04/04/16 1217 04/06/16 0530 04/07/16 0521  WBC 4.2 3.9* 5.6 4.9  HGB 9.5* 9.3* 9.2* 8.5*  HCT 29.0* 29.1* 28.6* 27.2*  MCV 89.8 90.1 89.9 89.8  PLT 112* 92* 135* 123*    Basic Metabolic   Panel:  Recent Labs Lab 04/01/16 0559 04/02/16 1350 04/04/16 0703 04/06/16 0530  NA 135 135 133* 135  K 4.4 5.9* 4.4 3.9  CL 99* 101 96* 99*  CO2 28 28 28 26  GLUCOSE 149* 134* 159* 97  BUN 11 19 19 14  CREATININE 2.01* 3.14* 3.12* 2.91*  2.92*  CALCIUM 7.4* 8.1* 7.7* 7.9*  PHOS  --   --  2.6 1.9*    GFR: Estimated Creatinine Clearance: 17.7 mL/min (by C-G formula based on Cr of 2.91).  Liver Function Tests:  Recent Labs Lab 04/04/16 0703 04/06/16 0530  ALBUMIN 2.0* 2.1*   No results for input(s): LIPASE, AMYLASE in the last 168 hours. No results for input(s): AMMONIA  in the last 168 hours.  Coagulation Profile: No results for input(s): INR, PROTIME in the last 168 hours.  Cardiac Enzymes: No results for input(s): CKTOTAL, CKMB, CKMBINDEX, TROPONINI in the last 168 hours.  BNP (last 3 results) No results for input(s): PROBNP in the last 8760 hours.  HbA1C: No results for input(s): HGBA1C in the last 72 hours.  CBG:  Recent Labs Lab 04/06/16 1007 04/06/16 1145 04/06/16 1647 04/06/16 1953 04/07/16 0744  GLUCAP 94 85 110* 125* 94    Lipid Profile: No results for input(s): CHOL, HDL, LDLCALC, TRIG, CHOLHDL, LDLDIRECT in the last 72 hours.  Thyroid Function Tests: No results for input(s): TSH, T4TOTAL, FREET4, T3FREE, THYROIDAB in the last 72 hours.  Anemia Panel: No results for input(s): VITAMINB12, FOLATE, FERRITIN, TIBC, IRON, RETICCTPCT in the last 72 hours.  Urine analysis:    Component Value Date/Time   COLORURINE YELLOW 03/31/2016 0858   APPEARANCEUR CLOUDY* 03/31/2016 0858   LABSPEC 1.030 03/31/2016 0858   PHURINE 6.0 03/31/2016 0858   GLUCOSEU 250* 03/31/2016 0858   HGBUR MODERATE* 03/31/2016 0858   BILIRUBINUR MODERATE* 03/31/2016 0858   KETONESUR 15* 03/31/2016 0858   PROTEINUR >300* 03/31/2016 0858   NITRITE NEGATIVE 03/31/2016 0858   LEUKOCYTESUR NEGATIVE 03/31/2016 0858    Sepsis Labs: Lactic Acid, Venous    Component Value Date/Time   LATICACIDVEN 0.79 03/30/2016 1703    MICROBIOLOGY: Recent Results (from the past 240 hour(s))  MRSA PCR Screening     Status: None   Collection Time: 03/30/16  6:37 PM  Result Value Ref Range Status   MRSA by PCR NEGATIVE NEGATIVE Final    Comment:        The GeneXpert MRSA Assay (FDA approved for NASAL specimens only), is one component of a comprehensive MRSA colonization surveillance program. It is not intended to diagnose MRSA infection nor to guide or monitor treatment for MRSA infections.   Urine culture     Status: None   Collection Time: 03/31/16  8:58 AM    Result Value Ref Range Status   Specimen Description URINE, CLEAN CATCH  Final   Special Requests NONE  Final   Culture MULTIPLE SPECIES PRESENT, SUGGEST RECOLLECTION  Final   Report Status 04/01/2016 FINAL  Final  Culture, blood (routine x 2)     Status: Abnormal   Collection Time: 04/02/16  4:05 PM  Result Value Ref Range Status   Specimen Description BLOOD HEMODIALYSIS LINE  Final   Special Requests BOTTLES DRAWN AEROBIC AND ANAEROBIC 10CC  Final   Culture  Setup Time   Final    GRAM POSITIVE COCCI IN CLUSTERS IN BOTH AEROBIC AND ANAEROBIC BOTTLES CRITICAL RESULT CALLED TO, READ BACK BY AND VERIFIED WITH: C. Eckelmann RN 13:15 04/03/16 ([email or web address])      Culture STAPHYLOCOCCUS SPECIES (COAGULASE NEGATIVE) (A)  Final   Report Status 04/05/2016 FINAL  Final   Organism ID, Bacteria STAPHYLOCOCCUS SPECIES (COAGULASE NEGATIVE)  Final      Susceptibility   Staphylococcus species (coagulase negative) - MIC*    CIPROFLOXACIN >=8 RESISTANT Resistant     ERYTHROMYCIN <=0.25 SENSITIVE Sensitive     GENTAMICIN <=0.5 SENSITIVE Sensitive     OXACILLIN >=4 RESISTANT Resistant     TETRACYCLINE <=1 SENSITIVE Sensitive     VANCOMYCIN 1 SENSITIVE Sensitive     TRIMETH/SULFA 160 RESISTANT Resistant     CLINDAMYCIN <=0.25 SENSITIVE Sensitive     RIFAMPIN <=0.5 SENSITIVE Sensitive     Inducible Clindamycin NEGATIVE Sensitive     * STAPHYLOCOCCUS SPECIES (COAGULASE NEGATIVE)  Culture, blood (routine x 2)     Status: Abnormal   Collection Time: 04/02/16  4:35 PM  Result Value Ref Range Status   Specimen Description BLOOD RIGHT ARM  Final   Special Requests   Final    BOTTLES DRAWN AEROBIC AND ANAEROBIC 4CC ANA 5CC AER   Culture  Setup Time   Final    GRAM POSITIVE COCCI IN CLUSTERS IN BOTH AEROBIC AND ANAEROBIC BOTTLES CRITICAL RESULT CALLED TO, READ BACK BY AND VERIFIED WITH: C. Eckelmann RN 16:45 04/03/16 ([email or web address])    Culture (A)  Final    STAPHYLOCOCCUS SPECIES (COAGULASE  NEGATIVE) SUSCEPTIBILITIES PERFORMED ON PREVIOUS CULTURE WITHIN THE LAST 5 DAYS.    Report Status 04/05/2016 FINAL  Final  Culture, blood (Routine X 2) w Reflex to ID Panel     Status: None (Preliminary result)   Collection Time: 04/04/16  3:28 PM  Result Value Ref Range Status   Specimen Description BLOOD RIGHT HAND  Final   Special Requests IN PEDIATRIC BOTTLE 0.5CC  Final   Culture NO GROWTH 2 DAYS  Final   Report Status PENDING  Incomplete  Cath Tip Culture     Status: None (Preliminary result)   Collection Time: 04/04/16  5:19 PM  Result Value Ref Range Status   Specimen Description CATH TIP RIGHT NECK  Final   Special Requests NONE  Final   Culture   Final    NO GROWTH 1 DAY Performed at [facility]    Report Status PENDING  Incomplete    RADIOLOGY STUDIES/RESULTS: Dg Chest 1 View  03/30/2016  CLINICAL DATA:  Syncopal episode EXAM: CHEST 1 VIEW COMPARISON:  02/28/2016 FINDINGS: Cardiac shadow is stable. Right jugular dialysis catheter is again seen and stable. The lungs are well aerated bilaterally. No focal infiltrate or sizable effusion is seen. No significant edema is seen. No bony abnormality is noted. IMPRESSION: No active disease. Electronically Signed   By: Mark  Lukens M.D.   On: 03/30/2016 16:43   Dg Chest 2 View  04/03/2016  CLINICAL DATA:  Short of breath today.  Pleural effusions. EXAM: CHEST  2 VIEW COMPARISON:  Chest CT, 04/01/2016.  Chest radiograph, 03/30/2016. FINDINGS: Cardiac silhouette is normal in size and configuration. No mediastinal or hilar masses or pathologically enlarged lymph nodes. Right internal jugular dual-lumen tunneled central venous catheter has its tip projecting in the right atrium. This is stable. There are small, right greater than left, pleural effusions. These appear smaller than noted on the recent prior CT. There is no convincing pneumonia or pulmonary edema. No pneumothorax. IMPRESSION: 1. Small, right greater left, pleural  effusions which appears smaller than they were on the recent prior chest CT. 2. No evidence of pneumonia or   pulmonary edema. Electronically Signed   By: [name]  Ormond M.D.   On: 04/03/2016 12:44   Dg Thoracic Spine 2 View  03/30/2016  CLINICAL DATA:  Upper and mid back pain after falling today. EXAM: THORACIC SPINE 2 VIEWS COMPARISON:  02/28/2016 chest radiograph FINDINGS: Right-sided dialysis catheter with tip at cavoatrial junction or high right atrium. Lateral view images from approximately the bottom of C6 through the bottom of T11. Moderate spondylosis across these levels, without vertebral body height loss. Lower thoracic vertebral body height maintained on lumbar spine radiographs, dictated separately. IMPRESSION: Spondylosis, without acute finding in the thoracic spine. Electronically Signed   By: Kyle  Talbot M.D.   On: 03/30/2016 16:44   Dg Lumbar Spine Complete  03/30/2016  CLINICAL DATA:  Patient status post fall. Mid and lower back pain. Initial encounter. EXAM: LUMBAR SPINE - COMPLETE 4+ VIEW COMPARISON:  None. FINDINGS: Mild leftward curvature of the lumbar spine. Preservation of the vertebral body and intervertebral disc space heights. SI joints are unremarkable. No evidence for acute lumbar spine fracture. Lower lumbar spine degenerative disc and facet disease. IMPRESSION: Degenerative disc disease.  No acute osseous abnormality. Electronically Signed   By: Drew  Davis M.D.   On: 03/30/2016 16:43   Ct Head Wo Contrast  03/30/2016  CLINICAL DATA:  Initial encounter for MULTIPLE RECENT FALLS, TODAY PT. FELL BUT DENIES LOC, GENERALIZED WEAKNESS, HX ESRD-DM-SYNCOPE-HTN-CAD, LW EXAM: CT HEAD WITHOUT CONTRAST CT CERVICAL SPINE WITHOUT CONTRAST TECHNIQUE: Multidetector CT imaging of the head and cervical spine was performed following the standard protocol without intravenous contrast. Multiplanar CT image reconstructions of the cervical spine were also generated. COMPARISON:  Head CT of  12/26/2015. Prior head and cervical spine CTs 12/25/2015. FINDINGS: CT HEAD FINDINGS Sinuses/Soft tissues: No significant soft tissue swelling. Mild ethmoid air cell mucosal thickening. No skull fracture. Clear mastoid air cells. Intracranial: Marked improvement to resolution of previously described subdural collection adjacent the left frontal lobe. Minimal prominence of extra-axial spaces remains, including image 17/series 201. Hypo attenuating. Bilateral vertebral artery atherosclerosis. Moderate low density in the periventricular white matter likely related to small vessel disease. Resolved right-sided extra-axial/subdural fluid collection. 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 No acute large vessel cortically based infarct. No new hemorrhage, intra-axial, or extra-axial fluid collection. No mass lesion. CT CERVICAL SPINE FINDINGS Spinal visualization through the bottom of T2. Prevertebral soft tissues are within normal limits. Left worse than right carotid atherosclerosis. A right-sided central line is incompletely imaged. Small right pleural effusion is similar to 12/25/2015. No apical pneumothorax. Base interlobular septal thickening which suggests interstitial edema. Skull base intact.  Skull base intact. Trace C2-3 anterolisthesis is similar and likely degenerative. Maintenance of vertebral body height. Straightening of expected lordosis. Facets are well-aligned. Multilevel spondylosis, with loss of intervertebral disc height and endplate osteophyte formation. C1-2 articulation demonstrates only degenerative change on coronal reformats. IMPRESSION: 1. No acute intracranial abnormality. Cerebral atrophy is small vessel ischemic change. 2. Resolved right and markedly improved to resolved left-sided subdural fluid collections. There is minimal residual prominence of the left extra-axial space adjacent the frontal lobe. 3. Cervical spondylosis, without acute fracture or subluxation. Straightening  of expected cervical lordosis could be positional, due to muscular spasm, or ligamentous injury. 4. Similar small right pleural effusion. Suspect interstitial edema/congestive heart failure. Electronically Signed   By: Kyle  Talbot M.D.   On: 03/30/2016 17:36   Ct Chest Wo Contrast  04/01/2016  CLINICAL DATA:  Shortness of breath today in patient admitted   to the hospital with a chief complaint of syncope on 03/30/2016. The patient denies cough. End-stage renal disease. Initial encounter. EXAM: CT CHEST WITHOUT CONTRAST TECHNIQUE: Multidetector CT imaging of the chest was performed following the standard protocol without IV contrast. COMPARISON:  PA and lateral chest 02/28/2016. FINDINGS: The patient has moderate bilateral pleural effusions, greater on the right. Mild cardiomegaly is seen. Small pericardial effusion is identified. Extensive calcific coronary artery disease is present. Dialysis catheter from a right IJ approach is noted. There is no axillary, hilar or mediastinal lymphadenopathy. The lungs demonstrate mild compressive atelectasis secondary to the patient's pleural effusions, slightly greater on the right. There is also some interlobular septal thickening in the upper lobes which is worse on the left. The lungs are otherwise unremarkable. Visualized upper abdomen demonstrates extensive atherosclerosis. No focal abnormality is seen. No focal bony abnormality is identified. IMPRESSION: Moderate bilateral pleural effusions, larger on the right. Interlobular septal thickening in the upper lobes bilaterally is compatible with mild edema. Small pericardial effusion. Extensive calcific coronary artery disease. Electronically Signed   By: Thomas  Dalessio M.D.   On: 04/01/2016 13:15   Ct Cervical Spine Wo Contrast  03/30/2016  CLINICAL DATA:  Initial encounter for MULTIPLE RECENT FALLS, TODAY PT. FELL BUT DENIES LOC, GENERALIZED WEAKNESS, HX ESRD-DM-SYNCOPE-HTN-CAD, LW EXAM: CT HEAD WITHOUT CONTRAST CT  CERVICAL SPINE WITHOUT CONTRAST TECHNIQUE: Multidetector CT imaging of the head and cervical spine was performed following the standard protocol without intravenous contrast. Multiplanar CT image reconstructions of the cervical spine were also generated. COMPARISON:  Head CT of 12/26/2015. Prior head and cervical spine CTs 12/25/2015. FINDINGS: CT HEAD FINDINGS Sinuses/Soft tissues: No significant soft tissue swelling. Mild ethmoid air cell mucosal thickening. No skull fracture. Clear mastoid air cells. Intracranial: Marked improvement to resolution of previously described subdural collection adjacent the left frontal lobe. Minimal prominence of extra-axial spaces remains, including image 17/series 201. Hypo attenuating. Bilateral vertebral artery atherosclerosis. Moderate low density in the periventricular white matter likely related to small vessel disease. Resolved right-sided extra-axial/subdural fluid collection. 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 No acute large vessel cortically based infarct. No new hemorrhage, intra-axial, or extra-axial fluid collection. No mass lesion. CT CERVICAL SPINE FINDINGS Spinal visualization through the bottom of T2. Prevertebral soft tissues are within normal limits. Left worse than right carotid atherosclerosis. A right-sided central line is incompletely imaged. Small right pleural effusion is similar to 12/25/2015. No apical pneumothorax. Base interlobular septal thickening which suggests interstitial edema. Skull base intact.  Skull base intact. Trace C2-3 anterolisthesis is similar and likely degenerative. Maintenance of vertebral body height. Straightening of expected lordosis. Facets are well-aligned. Multilevel spondylosis, with loss of intervertebral disc height and endplate osteophyte formation. C1-2 articulation demonstrates only degenerative change on coronal reformats. IMPRESSION: 1. No acute intracranial abnormality. Cerebral atrophy is small vessel  ischemic change. 2. Resolved right and markedly improved to resolved left-sided subdural fluid collections. There is minimal residual prominence of the left extra-axial space adjacent the frontal lobe. 3. Cervical spondylosis, without acute fracture or subluxation. Straightening of expected cervical lordosis could be positional, due to muscular spasm, or ligamentous injury. 4. Similar small right pleural effusion. Suspect interstitial edema/congestive heart failure. Electronically Signed   By: Kyle  Talbot M.D.   On: 03/30/2016 17:36   Mr Lumbar Spine Wo Contrast  04/05/2016  CLINICAL DATA:  74-year-old male with end-stage renal disease, back pain since December. Falls. Bacteremia. Initial encounter. EXAM: MRI LUMBAR SPINE WITHOUT CONTRAST TECHNIQUE: Multiplanar, multisequence MR   imaging of the lumbar spine was performed. No intravenous contrast was administered. COMPARISON:  Lumbar radiographs 03/30/2016. FINDINGS: Normal lumbar segmentation demonstrated on the comparison. The patient was unable to lie flat. Subsequently The examination had to be discontinued prior to completion. Only sagittal imaging could be obtained, and is motion degraded. Diffusely decreased T1 and T2 bone marrow signal. Abnormal STIR hyperintensity in the L4 inferior endplate and posterior vertebral body (series 4, image 3 and series 3, image 10. There is also mild associated abnormal increased STIR signal in the adjacent L5 superior endplate, but this appears somewhat focal like a Schmorl node. The posterior vertebral body signal abnormality appears somewhat contiguous with the abnormal intervertebral disc region signal. This could represent a leftward disc extrusion extending into the left L4 neural foramen. No evidence of acute osseous abnormality at the other visualized levels. No significant lumbar spinal stenosis. IMPRESSION: 1. The examination had to be discontinued prior to completion due to the patient's inability to lie flat. 2.  Abnormal signal in the L4 disc and adjacent L4-L5 endplates. However, favor this is due to a disc herniation extending into the left L4 neural foramen. Is there lateralizing left hip or lower extremity pain, such as due to left L4 radiculitis? 3. L4-L5 discitis osteomyelitis felt less likely. Repeat lumbar MRI to include axial imaging may be confirmatory when possible. 4. 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 Electronically Signed   By: H  Hall M.D.   On: 04/05/2016 17:46   Dg Chest Port 1 View  04/06/2016  CLINICAL DATA:  Central line placement, status post dialysis catheter insertion EXAM: PORTABLE CHEST 1 VIEW COMPARISON:  Chest radiograph dated 04/05/2016 FINDINGS: Lungs are clear.  No pleural effusion or pneumothorax. The heart is top-normal in size. Left IJ dual lumen dialysis catheter with its distal tip in the upper right atrium. IMPRESSION: Left-sided dual lumen dialysis catheter with its distal tip in the upper right atrium. No pneumothorax. Electronically Signed   By: Sriyesh  Krishnan M.D.   On: 04/06/2016 10:11   Dg Chest Port 1 View  04/05/2016  CLINICAL DATA:  Shortness of Breath EXAM: PORTABLE CHEST 1 VIEW COMPARISON:  04/03/2016 FINDINGS: Cardiac shadow is stable. The previously seen dialysis catheter has been removed in the interval. The lungs are well aerated bilaterally. Mild increased density is noted over both lungs which may be related to small effusions. No focal infiltrate is seen. No bony abnormality is noted. IMPRESSION: Question small effusions. Clinical correlation is recommended. Upright films when the patient is able are also recommended. Electronically Signed   By: Mark  Lukens M.D.   On: 04/05/2016 18:26   Dg Fluoro Guide Cv Line-no Report  04/06/2016  CLINICAL DATA:  FLOURO GUIDE CV LINE Fluoroscopy was utilized by the requesting physician.  No radiographic interpretation.     LOS: 5 days   GHIMIRE,SHANKER, MD  [facility] Pager:[phone]  If 7PM-7AM, please contact night-coverage [email or web address] Password [email or web address] 04/07/2016, 11:06 AM    

## 2016-04-07 NOTE — Progress Notes (Signed)
[facility] Rounding Note  Subjective:   Seen on dialysis "I just feel bad" Back hurts Wants food Not sure if TEE to be done today or not  Objective Vital signs in last 24 hours: Filed Vitals:   04/06/16 1010 04/06/16 1651 04/06/16 1950 04/07/16 0537  BP: 158/72 142/56 114/53 153/67  Pulse: 71 64 70 65  Temp: 98.2 F (36.8 C) 97.6 F (36.4 C) 98.2 F (36.8 C) 98 F (36.7 C)  TempSrc: Oral Oral Oral Oral  Resp: 12 16 16 16   Height:      Weight:    55.9 kg (123 lb 3.8 oz)  SpO2: 93% 93% 100% 100%   Weight change:  Physical Exam Thini pale chronically ill appearing WM Seen in HD New left TDC in use Old exit site from R IJ cath scab, no pus S1S2 No S3 Abdomen scaphoid, not tender 1+ edema both lower extremities Dialysis access: Left IJ TDC (4/23), left RC AVF (  General:Alert , NAD thin chronically ill W elderly M Heart: RRR no rub Lungs: CTA  Abdomen: soft NT/ ND Extremities: no  pedal edema Dialysis Access: New left IJ TDC (4/23), L RC AVF nice bruit, thrill. Maturing well so far (3/21)  Labs: Basic Metabolic Panel:  Recent Labs Lab 04/02/16 1350 04/04/16 0703 04/06/16 0530  NA 135 133* 135  K 5.9* 4.4 3.9  CL 101 96* 99*  CO2 28 28 26   GLUCOSE 134* 159* 97  BUN 19 19 14   CREATININE 3.14* 3.12* 2.91*  2.92*  CALCIUM 8.1* 7.7* 7.9*  PHOS  --  2.6 1.9*    Recent Labs Lab 04/04/16 0703 04/06/16 0530  ALBUMIN 2.0* 2.1*    Recent Labs Lab 04/04/16 0555 04/04/16 1217 04/06/16 0530 04/07/16 0521  WBC 4.2 3.9* 5.6 4.9  HGB 9.5* 9.3* 9.2* 8.5*  HCT 29.0* 29.1* 28.6* 27.2*  MCV 89.8 90.1 89.9 89.8  PLT 112* 92* 135* 123*    Recent Labs Lab 04/06/16 0843 04/06/16 1007 04/06/16 1145 04/06/16 1647 04/06/16 1953  GLUCAP 81 94 85 110* 125*    Medications:   . albumin human  25 g Intravenous Once  . aspirin EC  81 mg Oral Daily  . citalopram  20 mg Oral Daily  . darbepoetin (ARANESP) injection - DIALYSIS  60 mcg Intravenous Q Mon-HD  .  enoxaparin (LOVENOX) injection  30 mg Subcutaneous Q24H  . feeding supplement  1 Container Oral BID BM  . feeding supplement (PRO-STAT SUGAR FREE 64)  30 mL Oral Q2000  . folic acid  1 mg Oral Daily  . insulin aspart  0-9 Units Subcutaneous TID WC  . lisinopril  10 mg Oral Daily  . metoprolol succinate  25 mg Oral Daily  . pantoprazole  20 mg Oral Daily  . sodium chloride flush  3 mL Intravenous Q12H  . vancomycin  500 mg Intravenous Q M,W,F-HD   OP Dialysis Orders:  MWF  [place]  56kg-will be lower at discharge 2/2.25 bath  Hep none  LUA AVF (maturing)/ R IJ cath removed. New L TDC Mircera 30 ug every 2, last 4/12 Hep lock for cath  Assessment: 1. MRSE cath sepsis - cath removed 4/21, repeat BC's neg so far, new cath placed on 4/23. MRI no osteomyelitis or discitis/ replaced . On IV vancomycin 2. Mass on TTE - for TEE this week (? today) 3. Syncopal episode. Mildly reduced EF (40%). Mass as per #1.  4. Systolic heart failure/pulmonary edema. Prev EDW 56 kg.  Pre weight today 55.2 with 2.5 liter goal. (Still has some edema) 5. Bilateral pleural effusions - largely resolved on 4/23 CXR. Still just very mild blunting of CPA's. 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 Off all immunosuppression at this time. Continue MWF ([place]). [facility] 4/23 (L RC AVF done 03/04/16 7. Anemia - Darbe 60 QMonday with declining Hb. Increase to 100. (prev on Mircera outpt last dosed 4/12 at 30 mcg) 8. HTN - BB and lisinopril 9. Hx ANCA dz 10. DM 11. Back pain - No osteo/discitis but + disc herniation 12. Deconditioning 13. Hx falls/ SDH January. No heparin with HD outpt  (but I note is getting Lovenox). 14. Dispo - to SNF when ready   Camille Bal, MD [facility] [phone] Pager 04/07/2016, 8:31 AM

## 2016-04-07 NOTE — Procedures (Signed)
I have personally attended this patient's dialysis session.   Pre weight 55.2 with 2.5 liter goal BP stable so far Access left TDC On 4K bath - renal panel pending  Thien Berka, MD [facility] [phone] Pager 04/07/2016, 8:56 AM

## 2016-04-07 NOTE — Progress Notes (Signed)
    [facility] has been requested to perform a transesophageal echocardiogram on  04/07/16 for Right atrial mass.  After careful review of history and examination, the risks and benefits of transesophageal echocardiogram have been explained including risks of esophageal damage, perforation (1:10,000 risk), bleeding, pharyngeal hematoma as well as other potential complications associated with conscious sedation including aspiration, arrhythmia, respiratory failure and death. Alternatives to treatment were discussed, questions were answered. Patient is willing to proceed.  returned to review and pt is agreeable to proceed. INGOLD,LAURA R,  04/07/2016 8:12 AM

## 2016-04-08 ENCOUNTER — Encounter (HOSPITAL_COMMUNITY): Payer: Self-pay | Admitting: *Deleted

## 2016-04-08 ENCOUNTER — Encounter (HOSPITAL_COMMUNITY)
Admission: EM | Disposition: A | Discharge status: Skilled Nursing Facility | Payer: Self-pay | Source: Home / Self Care | Attending: Internal Medicine

## 2016-04-08 ENCOUNTER — Ambulatory Visit (HOSPITAL_COMMUNITY): Payer: Medicare HMO

## 2016-04-08 DIAGNOSIS — R7881 Bacteremia: Secondary | ICD-10-CM

## 2016-04-08 HISTORY — PX: TEE WITHOUT CARDIOVERSION: SHX5443

## 2016-04-08 LAB — GLUCOSE, CAPILLARY
GLUCOSE-CAPILLARY: 82 mg/dL (ref 65–99)
GLUCOSE-CAPILLARY: 110 mg/dL — AB (ref 65–99)
GLUCOSE-CAPILLARY: 132 mg/dL — AB (ref 65–99)
Glucose-Capillary: 81 mg/dL (ref 65–99)

## 2016-04-08 SURGERY — ECHOCARDIOGRAM, TRANSESOPHAGEAL
Anesthesia: Moderate Sedation

## 2016-04-08 MED ORDER — PRO-STAT SUGAR FREE PO LIQD
30.0000 mL | Freq: Two times a day (BID) | ORAL | Status: DC
  Administered 2016-04-08 – 2016-04-10 (×4): 30 mL via ORAL
  Filled 2016-04-08 (×6): qty 30

## 2016-04-08 MED ORDER — SODIUM CHLORIDE 0.9 % IV SOLN
INTRAVENOUS | Status: DC
  Administered 2016-04-08: 09:00:00 via INTRAVENOUS

## 2016-04-08 MED ORDER — FENTANYL CITRATE (PF) 100 MCG/2ML IJ SOLN
INTRAMUSCULAR | Status: AC
  Filled 2016-04-08: qty 2

## 2016-04-08 MED ORDER — BUTAMBEN-TETRACAINE-BENZOCAINE 2-2-14 % EX AERO
INHALATION_SPRAY | CUTANEOUS | Status: DC | PRN
  Administered 2016-04-08: 2 via TOPICAL

## 2016-04-08 MED ORDER — MIDAZOLAM HCL 10 MG/2ML IJ SOLN
INTRAMUSCULAR | Status: DC | PRN
  Administered 2016-04-08: 2 mg via INTRAVENOUS

## 2016-04-08 MED ORDER — MIDAZOLAM HCL 5 MG/ML IJ SOLN
INTRAMUSCULAR | Status: AC
  Filled 2016-04-08: qty 2

## 2016-04-08 MED ORDER — FENTANYL CITRATE (PF) 100 MCG/2ML IJ SOLN
INTRAMUSCULAR | Status: DC | PRN
  Administered 2016-04-08: 25 ug via INTRAVENOUS

## 2016-04-08 NOTE — Progress Notes (Signed)
Physical Therapy Treatment Patient Details Name: Jeffrey Walls MRN: [id number] DOB: Jul 02, 1941 Today's Date: 04/08/2016    History of Present Illness 75 year old male with past mental history of diabetes, hypertension and end-stage renal disease who was recently discharged from skilled nursing facility for rehabilitation and had been having increased weakness as well as came in following a syncopal episode. This has been the second one since patient finished inpatient rehabilitation. Hospital course complicated by development of acute systolic heart failure and fever on 4/19 with blood cultures positive for coag-negative staph-felt to be secondary to dialysis catheter related blood stream infection.Also found to have a right atrial mass on transthoracic echocardiogram on 4/19; plans for TEE 4/25    PT Comments    Making some progress with activity tolerance, and wiling to work, despite being NPO; walked hallways with close guard and frequent cues to self monitor for activity tolerance;  BP in session as follows:    04/08/16 0922 04/08/16 0924  Vital Signs  Pulse Rate 79 68  BP (!) 102/50 mmHg (!) 173/73 mmHg  Patient Position (if appropriate) Sitting (symptoms of dizziness post amb) Lying (after amb)      Follow Up Recommendations  SNF     Equipment Recommendations  3in1 (PT)    Recommendations for Other Services       Precautions / Restrictions Precautions Precautions: Fall Precaution Comments: orthostatic hypotension    Mobility  Bed Mobility Overal bed mobility: Needs Assistance Bed Mobility: Supine to Sit     Supine to sit: Supervision Sit to supine: Supervision   General bed mobility comments: Cues to self-monitor for acitivity tolerance; overall smooth transition  Transfers Overall transfer level: Needs assistance Equipment used: Rolling walker (2 wheeled) Transfers: Sit to/from Stand Sit to Stand: Supervision         General transfer comment: Cues to  self-monitor for activity tolerance  Ambulation/Gait Ambulation/Gait assistance: Min guard Ambulation Distance (Feet): 60 Feet Assistive device: Rolling walker (2 wheeled) Gait Pattern/deviations: Step-through pattern;Decreased stride length     General Gait Details: Very close guard and cues to self-monitor for activity tolerance; distance limited by lightheadedness; was hypotensive upon return to room   Stairs            Wheelchair Mobility    Modified Rankin (Stroke Patients Only)       Balance     Sitting balance-Leahy Scale: Fair       Standing balance-Leahy Scale: Fair                      Cognition Arousal/Alertness: Awake/alert Behavior During Therapy: WFL for tasks assessed/performed;Flat affect Overall Cognitive Status: Within Functional Limits for tasks assessed                      Exercises      General Comments        Pertinent Vitals/Pain Pain Assessment: Faces Faces Pain Scale: Hurts little more Pain Location: Low back pain; he did not give it a number when asked; reprots it is chronic pain Pain Descriptors / Indicators: Aching Pain Intervention(s): Monitored during session;Repositioned;Other (comment) (educated on positioning hips even with the joint in the bed)    Home Living Family/patient expects to be discharged to:: Private residence Living Arrangements: Alone                  Prior Function            PT Goals (current goals can  now be found in the care plan section) Acute Rehab PT Goals PT Goal Formulation: With patient Time For Goal Achievement: 04/15/16 Potential to Achieve Goals: Good Progress towards PT goals: Progressing toward goals    Frequency  Min 3X/week    PT Plan Current plan remains appropriate    Co-evaluation             End of Session Equipment Utilized During Treatment: Gait belt Activity Tolerance: Patient limited by fatigue Patient left: in bed;with call bell/phone  within reach     Time: 0907-0930 PT Time Calculation (min) (ACUTE ONLY): 23 min  Charges:  $Gait Training: 8-22 mins $Therapeutic Activity: 8-22 mins                    G Codes:      Olen PelGarrigan, Haileigh Pitz Hamff 04/08/2016, 9:48 AM  Van ClinesHolly Shilo Pauwels, PT  Acute Rehabilitation Services Pager [phone] Office [phone]

## 2016-04-08 NOTE — Progress Notes (Signed)
Echocardiogram Echocardiogram Transesophageal has been performed.  Nolon RodBrown, Tony 04/08/2016, 2:29 PM

## 2016-04-08 NOTE — Care Management Important Message (Signed)
Important Message  Patient Details  Name: Jeffrey Walls MRN: [id number] Date of Birth: 09/15/1941   Medicare Important Message Given:  Yes    Talbert Trembath P Rani Idler 04/08/2016, 1:38 PM

## 2016-04-08 NOTE — Progress Notes (Signed)
[facility] Progress Note    Subjective:   "I haven't had anything to eat, I can't sleep..I'm just miserable!" Emotional reassurance to patient. Currently NPO awaiting TEE. C/O SOB but lying flat in bed without SOB.   Objective Filed Vitals:   04/08/16 0510 04/08/16 0839 04/08/16 0922 04/08/16 0924  BP: 132/64 157/63 102/50 173/73  Pulse: 65 67 79 68  Temp: 97.9 F (36.6 C) 98 F (36.7 C)    TempSrc: Oral Oral    Resp: 18 18    Height:      Weight:      SpO2: 98% 98%     Physical Exam General: Anxious but cooperative, NAD  Heart: S1, S2, No M/R/G No JVD Lungs: bilateral breath sounds CTA Abdomen: hyperactive BS, nontender, nondistended.  Extremities: No LE edema Dialysis Access: LIJ perm cath Drsg CDI. Maturing LFA AVF + bruit    Additional Objective Labs: Basic Metabolic Panel:  Recent Labs Lab 04/02/16 1350 04/04/16 0703 04/06/16 0530  NA 135 133* 135  K 5.9* 4.4 3.9  CL 101 96* 99*  CO2 28 28 26   GLUCOSE 134* 159* 97  BUN 19 19 14   CREATININE 3.14* 3.12* 2.91*  2.92*  CALCIUM 8.1* 7.7* 7.9*  PHOS  --  2.6 1.9*     Recent Labs Lab 04/04/16 0703 04/06/16 0530  ALBUMIN 2.0* 2.1*     Recent Labs Lab 04/04/16 0555 04/04/16 1217 04/06/16 0530 04/07/16 0521  WBC 4.2 3.9* 5.6 4.9  HGB 9.5* 9.3* 9.2* 8.5*  HCT 29.0* 29.1* 28.6* 27.2*  MCV 89.8 90.1 89.9 89.8  PLT 112* 92* 135* 123*       Component Value Date/Time   SDES CATH TIP RIGHT NECK 04/04/2016 1719   SPECREQUEST NONE 04/04/2016 1719   CULT  04/04/2016 1719    NO GROWTH 2 DAYS [facility]    REPTSTATUS 04/07/2016 FINAL 04/04/2016 1719      Recent Labs Lab 04/07/16 0744 04/07/16 1223 04/07/16 1606 04/07/16 2140 04/08/16 0838  GLUCAP 94 86 82 113* 82   Medications: . sodium chloride 20 mL/hr at 04/08/16 0912   . albumin human  25 g Intravenous Once  . aspirin EC  81 mg Oral Daily  . citalopram  20 mg Oral Daily  . darbepoetin  (ARANESP) injection - DIALYSIS  100 mcg Intravenous Q Mon-HD  . enoxaparin (LOVENOX) injection  30 mg Subcutaneous Q24H  . feeding supplement  1 Container Oral BID BM  . feeding supplement (PRO-STAT SUGAR FREE 64)  30 mL Oral Q2000  . folic acid  1 mg Oral Daily  . insulin aspart  0-9 Units Subcutaneous TID WC  . lisinopril  10 mg Oral Daily  . metoprolol succinate  25 mg Oral Daily  . pantoprazole  20 mg Oral Daily  . sodium chloride flush  3 mL Intravenous Q12H  . vancomycin  500 mg Intravenous Q M,W,F-HD   Dialysis Orders: [place] MonWedFri, 4 hrs 0 min, 180NRe Optiflux, BFR 400, DFR Manual 800 mL/min, EDW 56 (kg), Dialysate 2.0 K, 2.25 Ca, UFR Profile: None, Sodium Model: None, Access: LIJ Perm Catheter-Tunneled Mircera: 30 mcg IV q 2 weeks  Assessment: 1. MRSE cath sepsis - cath removed 4/21, repeat BC's neg so far, new cath placed on 4/23. MRI no osteomyelitis or discitis/ replaced . On IV vancomycin/ID following.  2. 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 Cardiology following.  3. Syncopal episode. Mildly reduced EF (40%). .Marland Kitchen  4. Systolic heart failure/pulmonary edema. Wt now under OP EDW. Resolving.  5. Bilateral pleural effusions - largely resolved on 4/23 CXR. Still just very mild blunting of CPA's. 6. ESRD - Continue MWF ([place]). [facility] 4/23 (L RC AVF done 03/04/16) Next HD 04/09/16, K+3.9 7. Anemia - HGB 8.5. ESA dose 04/24. Check HGB tomorrow. Darbe 60 QMonday with declining Hb. Increase to 100. (prev on Mircera outpt last dosed 4/12 at 30 mcg) 8. HTN/Volume - BB and lisinopril. HD 04/08/16 Pre wt 55.2 kg Net UF Net UF 2500 Post Wt 52.3. Below OP EDW. Adjust on DC.  9. Nutrition: currently NPO. Renal diet with prostat/renal vits when able to eat 10. BMD: Phos 1.9-no binders. Ca 7.9 C Ca 9.42 No sensipar/no VDRA.  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 Off all immunosuppression at this time.  12. DM per primary 13. Back pain - No osteo/discitis but + disc  herniation 14. Deconditioning 15. Hx falls/ SDH January. No heparin with HD outpt. 16. Dispo - to SNF when ready  Rita H. Brown NP-C 04/08/2016, 10:08 AM  [employer] [phone]  I have seen and examined this patient and agree with plan with highlighted additions Zaiah Credeur B,MD 04/08/2016 1:29 PM

## 2016-04-08 NOTE — Progress Notes (Signed)
PROGRESS NOTE        PATIENT DETAILS Name: Jeffrey Walls Age: 75 y.o. Sex: male Date of Birth: 31-Dec-1940 Admit Date: 03/30/2016 Admitting Physician Haydee Salter, MD ZOX:[id number], MD  Brief Narrative: Patient is a 75 y.o. 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 Hospital course complicated by development of acute systolic heart failure and fever on 4/19 with blood cultures positive for coag-negative staph-felt to be secondary to dialysis catheter related blood stream infection.Also found to have a right atrial mass on transthoracic echocardiogram on 4/19. Hemodialysis catheter has been removed by vascular surgery.TEE on 4/24 confirmed atrial mass and revealed EF of 35-40%.  See below for further details.   Subjective: Patient doing well. Complaining only of trouble swallowing secondary to s/p TEE  Assessment/Plan: Principal Problem: Bacteremia, coagulase-negative staphylococcal: Probably secondary to hemodialysis catheter-related bloodstream infection. Hemodialysis catheter has been removed on 4/21, repeat blood cultures on 4/21 are negative, cath tip cultures are also negative so far. Infectious disease following, recommendations are to continue intravenous vancomycin for 6 weeks. MRI of the lumbosacral spine-although incomplete study-does not show discitis/osteomyelitis. See below for TEE.  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 Echo shows mildly decreased EF around 40%. Lower Ext Doppler negative.Telemetry negative. Spoke with cardiologist-Dr. Adella Hare reviewed chart, did not advise anticoagulate patient in this setting.   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 TEE confirmed atrial mass -which is likely related to infected HD catheter or may be a vegetation-recommendations are repeat TEE after he completes antibiotic   Acute systolic heart failure: EF by TEE on 4/25 35-40%, diffuse hypokinesis. Volume removal hemodialysis. Nephrology slowly decreasing dry weight.   Bilateral pleural effusion: Suspect related to CHF. Volume removal with hemodialysis. Doubt needs thoracocentesis at this point-as chest x-ray on 4/23 demonstrates no pleural effusion.  Elevated troponin: Likely false-positive elevation in the setting of ESRD-trend is flat not consistent with ACS. However does have known history of CAD. Will plan on medical treatment   Pulmonary hypertension: PA pressure of 60 mmHg via transthoracic echocardiogram, reviewed outpatient echocardiogram (September 2016) in care everywhere-  showed mild pulmonary hypertension. Suspect this is related to underlying Anca vasculitis.   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 Nephrology following   Hypertension:  BP controlled, continue lisinopril and metoprolol-may need to further adjust dosing depending on post dialysis dry weight.  Type 2 diabetes: CBGs stable-continue SSI.   Back pain:  Given bacteremia- MRI lumbosacral spine done on 4/22-although incomplete study-no discitis/osteomyelitis-but does show disc herniation at L4-L5. Continue supportive care  Anemia of renal disease: Management as per nephrology  Thrombocytopenia:Could be related to bacteremia, improving.   Frequent falls: non focal exam-check orthostatics-PT eval  Protein-calorie malnutrition, severe: Continue supplements.   DVT Prophylaxis: Prophylactic Lovenox   Code Status: Full code   Family Communication: None at bedside  Disposition Plan: Remain inpatient- SNF on  discharge when stable/work up complete  Antimicrobial agents: IV Vanco 4/19>> IV Elita Quick 4/19>>4/21  Procedures: Echo 4/19>>Moderate aortic regurg,  ejection fraction 40-45 percent, grade 2 diastolic dysfunction-with right atrial mass 4/21>> hemodialysis catheter removed 4/23>>Ultrasound-guided insertion of Diatek catheter, left internal jugular vein 4/25>>TEE-Right atrial mass  CONSULTS:  cardiology  Time spent: 25 minutes-Greater than 50% of this time was spent in counseling, explanation of diagnosis, planning of further management, and coordination of care.  MEDICATIONS: Anti-infectives    Start     Dose/Rate Route Frequency Ordered Stop   04/07/16 1003  vancomycin (VANCOCIN) 500 MG/100ML IVPB    Comments:  Woods, [name]   : cabinet override      04/07/16 1003 04/07/16 1034   04/06/16 0715  vancomycin (VANCOCIN) IVPB 1000 mg/200 mL premix  Status:  Discontinued     1,000 mg 200 mL/hr over 60 Minutes Intravenous To [facility] 04/06/16 0616 04/06/16 0621   04/04/16 1200  vancomycin (VANCOCIN) 500 mg in sodium chloride 0.9 % 100 mL IVPB     500 mg 100 mL/hr over 60 Minutes Intravenous Every M-W-F (Hemodialysis) 04/02/16 1630     04/02/16 1800  cefTAZidime (FORTAZ) 2 g in dextrose 5 % 50 mL IVPB  Status:  Discontinued     2 g 100 mL/hr over 30 Minutes Intravenous Every M-W-F (1800) 04/02/16 1624 04/04/16 1148   04/02/16 1630  vancomycin (VANCOCIN) IVPB 1000 mg/200 mL premix     1,000 mg 200 mL/hr over 60 Minutes Intravenous  Once 04/02/16 1623 04/02/16 1740      Scheduled Meds: . albumin human  25 g Intravenous Once  . aspirin EC  81 mg Oral Daily  . citalopram  20 mg Oral Daily  . darbepoetin (ARANESP) injection - DIALYSIS  100 mcg Intravenous Q Mon-HD  . enoxaparin (LOVENOX) injection  30 mg Subcutaneous Q24H  . feeding supplement  1 Container Oral BID BM  . feeding supplement (PRO-STAT SUGAR FREE 64)  30 mL Oral Q2000  . folic acid  1 mg Oral Daily  . insulin  aspart  0-9 Units Subcutaneous TID WC  . lisinopril  10 mg Oral Daily  . metoprolol succinate  25 mg Oral Daily  . pantoprazole  20 mg Oral Daily  . sodium chloride flush  3 mL Intravenous Q12H  . vancomycin  500 mg Intravenous Q M,W,F-HD   Continuous Infusions:  PRN Meds:.acetaminophen **OR** acetaminophen, ALPRAZolam, morphine injection, ondansetron **OR** ondansetron (ZOFRAN) IV, oxyCODONE, oxyCODONE-acetaminophen, senna-docusate, zolpidem   PHYSICAL EXAM: Vital signs: Filed Vitals:   04/08/16 1330 04/08/16 1335 04/08/16 1345 04/08/16 1350  BP: 163/51 171/63 204/65 176/66  Pulse: 64 65 68 69  Temp:      TempSrc:   Oral   Resp: 14 12 12 18   Height:      Weight:      SpO2: 100% 100% 99% 96%   Filed Weights   04/07/16 0745 04/07/16 1143 04/07/16 2142  Weight: 55.2 kg (121 lb 11.1 oz) 52.3 kg (115 lb 4.8 oz) 53 kg (116 lb 13.5 oz)   Body mass index is 17.25 kg/(m^2).   Gen Exam: Awake and alert with clear speech. Not in any distress  Neck: Supple, No JVD.  Chest: B/L Clear. No rales or rhonchi  CVS: S1 S2 Regular Abdomen: soft, BS +, non tender, non distended.  Extremities: no edema, lower extremities warm to touch. Neurologic: Non Focal.   Skin: No Rash or lesions   Wounds: N/A.    LABORATORY DATA: CBC:  Recent Labs Lab 04/04/16 0555 04/04/16 1217 04/06/16 0530 04/07/16 0521  WBC 4.2 3.9* 5.6 4.9  HGB 9.5* 9.3* 9.2* 8.5*  HCT 29.0* 29.1* 28.6* 27.2*  MCV 89.8 90.1 89.9 89.8  PLT 112* 92* 135* 123*    Basic Metabolic Panel:  Recent Labs Lab 04/02/16 1350 04/04/16 0703 04/06/16 0530  NA 135 133* 135  K 5.9* 4.4 3.9  CL 101 96* 99*  CO2 28 28 26   GLUCOSE 134* 159* 97  BUN 19 19 14   CREATININE 3.14* 3.12* 2.91*  2.92*  CALCIUM 8.1* 7.7* 7.9*  PHOS  --  2.6 1.9*    GFR: Estimated Creatinine Clearance: 16.9 mL/min (by C-G formula based on Cr of 2.91).  Liver Function Tests:  Recent Labs Lab 04/04/16 0703 04/06/16 0530  ALBUMIN 2.0* 2.1*    No results for input(s): LIPASE, AMYLASE in the last 168 hours. No results for input(s): AMMONIA in the last 168 hours.  Coagulation Profile: No results for input(s): INR, PROTIME in the last 168 hours.  Cardiac Enzymes: No results for input(s): CKTOTAL, CKMB, CKMBINDEX, TROPONINI in the last 168 hours.  BNP (last 3 results) No results for input(s): PROBNP in the last 8760 hours.  HbA1C: No results for input(s): HGBA1C in the last 72 hours.  CBG:  Recent Labs Lab 04/07/16 1223 04/07/16 1606 04/07/16 2140 04/08/16 0838 04/08/16 1155  GLUCAP 86 82 113* 82 81    Lipid Profile: No results for input(s): CHOL, HDL, LDLCALC, TRIG, CHOLHDL, LDLDIRECT in the last 72 hours.  Thyroid Function Tests: No results for input(s): TSH, T4TOTAL, FREET4, T3FREE, THYROIDAB in the last 72 hours.  Anemia Panel: No results for input(s): VITAMINB12, FOLATE, FERRITIN, TIBC, IRON, RETICCTPCT in the last 72 hours.  Urine analysis:    Component Value Date/Time   COLORURINE YELLOW 03/31/2016 0858   APPEARANCEUR CLOUDY* 03/31/2016 0858   LABSPEC 1.030 03/31/2016 0858   PHURINE 6.0 03/31/2016 0858   GLUCOSEU 250* 03/31/2016 0858   HGBUR MODERATE* 03/31/2016 0858   BILIRUBINUR MODERATE* 03/31/2016 0858   KETONESUR 15* 03/31/2016 0858   PROTEINUR >300* 03/31/2016 0858   NITRITE NEGATIVE 03/31/2016 0858   LEUKOCYTESUR NEGATIVE 03/31/2016 0858    Sepsis Labs: Lactic Acid, Venous    Component Value Date/Time   LATICACIDVEN 0.79 03/30/2016 1703    MICROBIOLOGY: Recent Results (from the past 240 hour(s))  MRSA PCR Screening     Status: None   Collection Time: 03/30/16  6:37 PM  Result Value Ref Range Status   MRSA by PCR NEGATIVE NEGATIVE Final    Comment:        The GeneXpert MRSA Assay (FDA approved for NASAL specimens only), is one component of a comprehensive MRSA colonization surveillance program. It is not intended to diagnose MRSA infection nor to guide or monitor  treatment for MRSA infections.   Urine culture     Status: None   Collection Time: 03/31/16  8:58 AM  Result Value Ref Range Status   Specimen Description URINE, CLEAN CATCH  Final   Special Requests NONE  Final   Culture MULTIPLE SPECIES PRESENT, SUGGEST RECOLLECTION  Final   Report Status 04/01/2016 FINAL  Final  Culture, blood (routine x 2)     Status: Abnormal   Collection Time: 04/02/16  4:05 PM  Result Value Ref Range Status   Specimen Description BLOOD HEMODIALYSIS LINE  Final   Special Requests BOTTLES DRAWN AEROBIC AND ANAEROBIC 10CC  Final   Culture  Setup Time   Final    GRAM POSITIVE COCCI IN CLUSTERS IN BOTH AEROBIC AND ANAEROBIC BOTTLES CRITICAL RESULT CALLED TO,  READ BACK BY AND VERIFIED WITH: Patsey Berthold RN 13:15 04/03/16 ([email or web address])    Culture STAPHYLOCOCCUS SPECIES (COAGULASE NEGATIVE) (A)  Final   Report Status 04/05/2016 FINAL  Final   Organism ID, Bacteria STAPHYLOCOCCUS SPECIES (COAGULASE NEGATIVE)  Final      Susceptibility   Staphylococcus species (coagulase negative) - MIC*    CIPROFLOXACIN >=8 RESISTANT Resistant     ERYTHROMYCIN <=0.25 SENSITIVE Sensitive     GENTAMICIN <=0.5 SENSITIVE Sensitive     OXACILLIN >=4 RESISTANT Resistant     TETRACYCLINE <=1 SENSITIVE Sensitive     VANCOMYCIN 1 SENSITIVE Sensitive     TRIMETH/SULFA 160 RESISTANT Resistant     CLINDAMYCIN <=0.25 SENSITIVE Sensitive     RIFAMPIN <=0.5 SENSITIVE Sensitive     Inducible Clindamycin NEGATIVE Sensitive     * STAPHYLOCOCCUS SPECIES (COAGULASE NEGATIVE)  Culture, blood (routine x 2)     Status: Abnormal   Collection Time: 04/02/16  4:35 PM  Result Value Ref Range Status   Specimen Description BLOOD RIGHT ARM  Final   Special Requests   Final    BOTTLES DRAWN AEROBIC AND ANAEROBIC 4CC ANA 5CC AER   Culture  Setup Time   Final    GRAM POSITIVE COCCI IN CLUSTERS IN BOTH AEROBIC AND ANAEROBIC BOTTLES CRITICAL RESULT CALLED TO, READ BACK BY AND VERIFIED WITH: Patsey Berthold RN 16:45  04/03/16 ([email or web address])    Culture (A)  Final    STAPHYLOCOCCUS SPECIES (COAGULASE NEGATIVE) SUSCEPTIBILITIES PERFORMED ON PREVIOUS CULTURE WITHIN THE LAST 5 DAYS.    Report Status 04/05/2016 FINAL  Final  Culture, blood (Routine X 2) w Reflex to ID Panel     Status: None (Preliminary result)   Collection Time: 04/04/16  3:28 PM  Result Value Ref Range Status   Specimen Description BLOOD RIGHT HAND  Final   Special Requests IN PEDIATRIC BOTTLE 0.5CC  Final   Culture NO GROWTH 4 DAYS  Final   Report Status PENDING  Incomplete  Cath Tip Culture     Status: None   Collection Time: 04/04/16  5:19 PM  Result Value Ref Range Status   Specimen Description CATH TIP RIGHT NECK  Final   Special Requests NONE  Final   Culture   Final    NO GROWTH 2 DAYS Performed at [facility]    Report Status 04/07/2016 FINAL  Final    RADIOLOGY STUDIES/RESULTS: Dg Chest 1 View  03/30/2016  CLINICAL DATA:  Syncopal episode EXAM: CHEST 1 VIEW COMPARISON:  02/28/2016 FINDINGS: Cardiac shadow is stable. Right jugular dialysis catheter is again seen and stable. The lungs are well aerated bilaterally. No focal infiltrate or sizable effusion is seen. No significant edema is seen. No bony abnormality is noted. IMPRESSION: No active disease. Electronically Signed   By: Alcide Clever M.D.   On: 03/30/2016 16:43   Dg Chest 2 View  04/03/2016  CLINICAL DATA:  Short of breath today.  Pleural effusions. EXAM: CHEST  2 VIEW COMPARISON:  Chest CT, 04/01/2016.  Chest radiograph, 03/30/2016. FINDINGS: Cardiac silhouette is normal in size and configuration. No mediastinal or hilar masses or pathologically enlarged lymph nodes. Right internal jugular dual-lumen tunneled central venous catheter has its tip projecting in the right atrium. This is stable. There are small, right greater than left, pleural effusions. These appear smaller than noted on the recent prior CT. There is no convincing pneumonia or pulmonary edema. No  pneumothorax. IMPRESSION: 1. Small, right greater left, pleural effusions which appears smaller than  they were on the recent prior chest CT. 2. No evidence of pneumonia or pulmonary edema. Electronically Signed   By: Amie Portland M.D.   On: 04/03/2016 12:44   Dg Thoracic Spine 2 View  03/30/2016  CLINICAL DATA:  Upper and mid back pain after falling today. EXAM: THORACIC SPINE 2 VIEWS COMPARISON:  02/28/2016 chest radiograph FINDINGS: Right-sided dialysis catheter with tip at cavoatrial junction or high right atrium. Lateral view images from approximately the bottom of C6 through the bottom of T11. Moderate spondylosis across these levels, without vertebral body height loss. Lower thoracic vertebral body height maintained on lumbar spine radiographs, dictated separately. IMPRESSION: Spondylosis, without acute finding in the thoracic spine. Electronically Signed   By: Jeronimo Greaves M.D.   On: 03/30/2016 16:44   Dg Lumbar Spine Complete  03/30/2016  CLINICAL DATA:  Patient status post fall. Mid and lower back pain. Initial encounter. EXAM: LUMBAR SPINE - COMPLETE 4+ VIEW COMPARISON:  None. FINDINGS: Mild leftward curvature of the lumbar spine. Preservation of the vertebral body and intervertebral disc space heights. SI joints are unremarkable. No evidence for acute lumbar spine fracture. Lower lumbar spine degenerative disc and facet disease. IMPRESSION: Degenerative disc disease.  No acute osseous abnormality. Electronically Signed   By: Annia Belt M.D.   On: 03/30/2016 16:43   Ct Head Wo Contrast  03/30/2016  CLINICAL DATA:  Initial encounter for MULTIPLE RECENT FALLS, TODAY PT. FELL BUT DENIES LOC, GENERALIZED WEAKNESS, HX ESRD-DM-SYNCOPE-HTN-CAD, LW EXAM: CT HEAD WITHOUT CONTRAST CT CERVICAL SPINE WITHOUT CONTRAST TECHNIQUE: Multidetector CT imaging of the head and cervical spine was performed following the standard protocol without intravenous contrast. Multiplanar CT image reconstructions of the  cervical spine were also generated. COMPARISON:  Head CT of 12/26/2015. Prior head and cervical spine CTs 12/25/2015. FINDINGS: CT HEAD FINDINGS Sinuses/Soft tissues: No significant soft tissue swelling. Mild ethmoid air cell mucosal thickening. No skull fracture. Clear mastoid air cells. Intracranial: Marked improvement to resolution of previously described subdural collection adjacent the left frontal lobe. Minimal prominence of extra-axial spaces remains, including image 17/series 201. Hypo attenuating. Bilateral vertebral artery atherosclerosis. Moderate low density in the periventricular white matter likely related to small vessel disease. Resolved right-sided extra-axial/subdural fluid collection. 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 No acute large vessel cortically based infarct. No new hemorrhage, intra-axial, or extra-axial fluid collection. No mass lesion. CT CERVICAL SPINE FINDINGS Spinal visualization through the bottom of T2. Prevertebral soft tissues are within normal limits. Left worse than right carotid atherosclerosis. A right-sided central line is incompletely imaged. Small right pleural effusion is similar to 12/25/2015. No apical pneumothorax. Base interlobular septal thickening which suggests interstitial edema. Skull base intact.  Skull base intact. Trace C2-3 anterolisthesis is similar and likely degenerative. Maintenance of vertebral body height. Straightening of expected lordosis. Facets are well-aligned. Multilevel spondylosis, with loss of intervertebral disc height and endplate osteophyte formation. C1-2 articulation demonstrates only degenerative change on coronal reformats. IMPRESSION: 1. No acute intracranial abnormality. Cerebral atrophy is small vessel ischemic change. 2. Resolved right and markedly improved to resolved left-sided subdural fluid collections. There is minimal residual prominence of the left extra-axial space adjacent the frontal lobe. 3. Cervical  spondylosis, without acute fracture or subluxation. Straightening of expected cervical lordosis could be positional, due to muscular spasm, or ligamentous injury. 4. Similar small right pleural effusion. Suspect interstitial edema/congestive heart failure. Electronically Signed   By: Jeronimo Greaves M.D.   On: 03/30/2016 17:36   Ct Chest Wo  Contrast  04/01/2016  CLINICAL DATA:  Shortness of breath today in patient admitted to the hospital with a chief complaint of syncope on 03/30/2016. The patient denies cough. End-stage renal disease. Initial encounter. EXAM: CT CHEST WITHOUT CONTRAST TECHNIQUE: Multidetector CT imaging of the chest was performed following the standard protocol without IV contrast. COMPARISON:  PA and lateral chest 02/28/2016. FINDINGS: The patient has moderate bilateral pleural effusions, greater on the right. Mild cardiomegaly is seen. Small pericardial effusion is identified. Extensive calcific coronary artery disease is present. Dialysis catheter from a right IJ approach is noted. There is no axillary, hilar or mediastinal lymphadenopathy. The lungs demonstrate mild compressive atelectasis secondary to the patient's pleural effusions, slightly greater on the right. There is also some interlobular septal thickening in the upper lobes which is worse on the left. The lungs are otherwise unremarkable. Visualized upper abdomen demonstrates extensive atherosclerosis. No focal abnormality is seen. No focal bony abnormality is identified. IMPRESSION: Moderate bilateral pleural effusions, larger on the right. Interlobular septal thickening in the upper lobes bilaterally is compatible with mild edema. Small pericardial effusion. Extensive calcific coronary artery disease. Electronically Signed   By: Drusilla Kanner M.D.   On: 04/01/2016 13:15   Ct Cervical Spine Wo Contrast  03/30/2016  CLINICAL DATA:  Initial encounter for MULTIPLE RECENT FALLS, TODAY PT. FELL BUT DENIES LOC, GENERALIZED WEAKNESS,  HX ESRD-DM-SYNCOPE-HTN-CAD, LW EXAM: CT HEAD WITHOUT CONTRAST CT CERVICAL SPINE WITHOUT CONTRAST TECHNIQUE: Multidetector CT imaging of the head and cervical spine was performed following the standard protocol without intravenous contrast. Multiplanar CT image reconstructions of the cervical spine were also generated. COMPARISON:  Head CT of 12/26/2015. Prior head and cervical spine CTs 12/25/2015. FINDINGS: CT HEAD FINDINGS Sinuses/Soft tissues: No significant soft tissue swelling. Mild ethmoid air cell mucosal thickening. No skull fracture. Clear mastoid air cells. Intracranial: Marked improvement to resolution of previously described subdural collection adjacent the left frontal lobe. Minimal prominence of extra-axial spaces remains, including image 17/series 201. Hypo attenuating. Bilateral vertebral artery atherosclerosis. Moderate low density in the periventricular white matter likely related to small vessel disease. Resolved right-sided extra-axial/subdural fluid collection. 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 No acute large vessel cortically based infarct. No new hemorrhage, intra-axial, or extra-axial fluid collection. No mass lesion. CT CERVICAL SPINE FINDINGS Spinal visualization through the bottom of T2. Prevertebral soft tissues are within normal limits. Left worse than right carotid atherosclerosis. A right-sided central line is incompletely imaged. Small right pleural effusion is similar to 12/25/2015. No apical pneumothorax. Base interlobular septal thickening which suggests interstitial edema. Skull base intact.  Skull base intact. Trace C2-3 anterolisthesis is similar and likely degenerative. Maintenance of vertebral body height. Straightening of expected lordosis. Facets are well-aligned. Multilevel spondylosis, with loss of intervertebral disc height and endplate osteophyte formation. C1-2 articulation demonstrates only degenerative change on coronal reformats. IMPRESSION: 1. No  acute intracranial abnormality. Cerebral atrophy is small vessel ischemic change. 2. Resolved right and markedly improved to resolved left-sided subdural fluid collections. There is minimal residual prominence of the left extra-axial space adjacent the frontal lobe. 3. Cervical spondylosis, without acute fracture or subluxation. Straightening of expected cervical lordosis could be positional, due to muscular spasm, or ligamentous injury. 4. Similar small right pleural effusion. Suspect interstitial edema/congestive heart failure. Electronically Signed   By: Jeronimo Greaves M.D.   On: 03/30/2016 17:36   Mr Lumbar Spine Wo Contrast  04/05/2016  CLINICAL DATA:  75 year old male with end-stage renal disease, back pain since December.  Falls. Bacteremia. Initial encounter. EXAM: MRI LUMBAR SPINE WITHOUT CONTRAST TECHNIQUE: Multiplanar, multisequence MR imaging of the lumbar spine was performed. No intravenous contrast was administered. COMPARISON:  Lumbar radiographs 03/30/2016. FINDINGS: Normal lumbar segmentation demonstrated on the comparison. The patient was unable to lie flat. Subsequently The examination had to be discontinued prior to completion. Only sagittal imaging could be obtained, and is motion degraded. Diffusely decreased T1 and T2 bone marrow signal. Abnormal STIR hyperintensity in the L4 inferior endplate and posterior vertebral body (series 4, image 3 and series 3, image 10. There is also mild associated abnormal increased STIR signal in the adjacent L5 superior endplate, but this appears somewhat focal like a Schmorl node. The posterior vertebral body signal abnormality appears somewhat contiguous with the abnormal intervertebral disc region signal. This could represent a leftward disc extrusion extending into the left L4 neural foramen. No evidence of acute osseous abnormality at the other visualized levels. No significant lumbar spinal stenosis. IMPRESSION: 1. The examination had to be discontinued  prior to completion due to the patient's inability to lie flat. 2. Abnormal signal in the L4 disc and adjacent L4-L5 endplates. However, favor this is due to a disc herniation extending into the left L4 neural foramen. Is there lateralizing left hip or lower extremity pain, such as due to left L4 radiculitis? 3. L4-L5 discitis osteomyelitis felt less likely. Repeat lumbar MRI to include axial imaging may be confirmatory when possible. 4. 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 Electronically Signed   By: Odessa Fleming M.D.   On: 04/05/2016 17:46   Dg Chest Port 1 View  04/06/2016  CLINICAL DATA:  Central line placement, status post dialysis catheter insertion EXAM: PORTABLE CHEST 1 VIEW COMPARISON:  Chest radiograph dated 04/05/2016 FINDINGS: Lungs are clear.  No pleural effusion or pneumothorax. The heart is top-normal in size. Left IJ dual lumen dialysis catheter with its distal tip in the upper right atrium. IMPRESSION: Left-sided dual lumen dialysis catheter with its distal tip in the upper right atrium. No pneumothorax. Electronically Signed   By: Charline Bills M.D.   On: 04/06/2016 10:11   Dg Chest Port 1 View  04/05/2016  CLINICAL DATA:  Shortness of Breath EXAM: PORTABLE CHEST 1 VIEW COMPARISON:  04/03/2016 FINDINGS: Cardiac shadow is stable. The previously seen dialysis catheter has been removed in the interval. The lungs are well aerated bilaterally. Mild increased density is noted over both lungs which may be related to small effusions. No focal infiltrate is seen. No bony abnormality is noted. IMPRESSION: Question small effusions. Clinical correlation is recommended. Upright films when the patient is able are also recommended. Electronically Signed   By: Alcide Clever M.D.   On: 04/05/2016 18:26   Dg Fluoro Guide Cv Line-no Report  04/06/2016  CLINICAL DATA:  FLOURO GUIDE CV LINE Fluoroscopy was utilized by the requesting physician.  No radiographic  interpretation.     LOS: 6 days   Jeoffrey Massed, MD  [facility] Pager:[phone]  If 7PM-7AM, please contact night-coverage [email or web address] Password [facility] 04/08/2016, 2:48 PM

## 2016-04-08 NOTE — H&P (View-Only) (Signed)
PROGRESS NOTE        PATIENT DETAILS Name: Jeffrey Walls Age: 75 y.o. Sex: male Date of Birth: 12/19/1940 Admit Date: 03/30/2016 Admitting Physician Haydee Salter, MD ZOX:[id number], MD  Brief Narrative: Patient is a 75 y.o. 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 Hospital course complicated by development of acute systolic heart failure and fever on 4/19 with blood cultures positive for coag-negative staph-felt to be secondary to dialysis catheter related blood stream infection.Also found to have a right atrial mass on transthoracic echocardiogram on 4/19 Plans are for TEE 4/24.Hemodialysis catheter has been removed by vascular surgery. See below for further details.   Subjective: Seen at HD-claims back pain is well controlled. No SOB.   Assessment/Plan: Principal Problem: Bacteremia, coagulase-negative staphylococcal: Probably secondary to hemodialysis catheter-related bloodstream infection. Hemodialysis catheter has been removed on 4/21, repeat blood cultures on 4/21 are negative so far. Infectious disease following, recommendations are to continue intravenous vancomycin.  TEE scheduled for 4/24.MRI of the lumbosacral spine-although incomplete study-does not show discitis/osteomyelitis.   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 Echo shows mildly decreased EF around 40%. Lower Ext Doppler negative.Telemetry negative. Spoke with cardiologist-Dr. Adella Hare reviewed chart, did not advise anticoagulate patient in this setting. 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 TEE being planned for 4/24  Acute systolic heart failure: EF by TTE on 4/19 around 40-45%. Volume removal hemodialysis. Nephrology slowly decreasing dry weight.   Bilateral pleural effusion: Suspect related to CHF. Volume removal with hemodialysis. Doubt needs thoracocentesis at this point-as chest x-ray on 4/23 demonstrates no pleural effusion.  Elevated troponin: Likely false-positive elevation in the setting of ESRD-trend is flat not consistent with ACS. However does have known history of CAD. Will plan on medical treatment   Pulmonary hypertension: PA pressure of 60 mmHg via transthoracic echocardiogram, reviewed outpatient echocardiogram (September 2016) in care everywhere-  showed mild pulmonary hypertension. Suspect this is related to underlying Anca vasculitis.   ESRD: 2/ MPO + Anca associated vasculitis-treated at [facility] now off all immunosuppressants.On hemodialysis MWF. Nephrology following   Hypertension:  BP controlled, continue lisinopril and metoprolol-may need to further adjust dosing depending on post dialysis dry weight.  Type 2 diabetes: CBGs stable-continue SSI.   Back pain:  Given bacteremia- MRI lumbosacral spine done on 4/22-although incomplete study-no discitis/osteomyelitis-but does show disc herniation at L4-L5. Continue supportive care  Anemia of renal disease: Management as per nephrology  Thrombocytopenia:Could be related to bacteremia, improving.   Frequent falls: non focal exam-check orthostatics-PT eval  Protein-calorie malnutrition, severe: Continue supplements.   DVT Prophylaxis: Prophylactic Lovenox   Code Status: Full code   Family Communication: None at bedside  Disposition Plan: Remain inpatient- SNF on discharge when stable/work up complete  Antimicrobial agents: IV Vanco 4/19>> IV Elita Quick 4/19>>4/21  Procedures: Echo 4/19>>Moderate aortic regurg, ejection fraction 40-45 percent, grade 2 diastolic dysfunction-with right  atrial mass 4/21>> hemodialysis catheter removed 4/23>>Ultrasound-guided insertion of Diatek catheter, left internal jugular vein  CONSULTS:  cardiology  Time spent: 25 minutes-Greater than 50%  of this time was spent in counseling, explanation of diagnosis, planning of further management, and coordination of care.  MEDICATIONS: Anti-infectives    Start     Dose/Rate Route Frequency Ordered Stop   04/07/16 1003  vancomycin (VANCOCIN) 500 MG/100ML IVPB    Comments:  Woods, [name]   : cabinet override      04/07/16 1003 04/07/16 1034   04/06/16 0715  vancomycin (VANCOCIN) IVPB 1000 mg/200 mL premix  Status:  Discontinued     1,000 mg 200 mL/hr over 60 Minutes Intravenous To ShortStay Surgical 04/06/16 0616 04/06/16 0621   04/04/16 1200  vancomycin (VANCOCIN) 500 mg in sodium chloride 0.9 % 100 mL IVPB     500 mg 100 mL/hr over 60 Minutes Intravenous Every M-W-F (Hemodialysis) 04/02/16 1630     04/02/16 1800  cefTAZidime (FORTAZ) 2 g in dextrose 5 % 50 mL IVPB  Status:  Discontinued     2 g 100 mL/hr over 30 Minutes Intravenous Every M-W-F (1800) 04/02/16 1624 04/04/16 1148   04/02/16 1630  vancomycin (VANCOCIN) IVPB 1000 mg/200 mL premix     1,000 mg 200 mL/hr over 60 Minutes Intravenous  Once 04/02/16 1623 04/02/16 1740      Scheduled Meds: . albumin human  25 g Intravenous Once  . aspirin EC  81 mg Oral Daily  . citalopram  20 mg Oral Daily  . darbepoetin (ARANESP) injection - DIALYSIS  100 mcg Intravenous Q Mon-HD  . enoxaparin (LOVENOX) injection  30 mg Subcutaneous Q24H  . feeding supplement  1 Container Oral BID BM  . feeding supplement (PRO-STAT SUGAR FREE 64)  30 mL Oral Q2000  . folic acid  1 mg Oral Daily  . insulin aspart  0-9 Units Subcutaneous TID WC  . lisinopril  10 mg Oral Daily  . metoprolol succinate  25 mg Oral Daily  . pantoprazole  20 mg Oral Daily  . sodium chloride flush  3 mL Intravenous Q12H  . vancomycin  500 mg Intravenous Q M,W,F-HD   Continuous  Infusions:  PRN Meds:.sodium chloride, sodium chloride, acetaminophen **OR** acetaminophen, ALPRAZolam, alteplase, heparin, lidocaine (PF), lidocaine-prilocaine, morphine injection, ondansetron **OR** ondansetron (ZOFRAN) IV, oxyCODONE, oxyCODONE-acetaminophen, pentafluoroprop-tetrafluoroeth, senna-docusate, zolpidem   PHYSICAL EXAM: Vital signs: Filed Vitals:   04/07/16 0928 04/07/16 0954 04/07/16 1026 04/07/16 1055  BP: 125/68 97/58 124/68 95/59  Pulse: 69 68 73 69  Temp:      TempSrc:      Resp: 12 16 16 11   Height:      Weight:      SpO2:       Filed Weights   04/05/16 0600 04/07/16 0537 04/07/16 0745  Weight: 53.7 kg (118 lb 6.2 oz) 55.9 kg (123 lb 3.8 oz) 55.2 kg (121 lb 11.1 oz)   Body mass index is 17.96 kg/(m^2).   Gen Exam: Awake and alert with clear speech. Not in any distress  Neck: Supple, No JVD.  Chest: B/L Clear. No rales or rhonchi  CVS: S1 S2 Regular Abdomen: soft, BS +, non tender, non distended.  Extremities: no edema, lower extremities warm to touch. Neurologic: Non Focal.   Skin: No Rash or lesions   Wounds: N/A.    LABORATORY DATA: CBC:  Recent Labs Lab 04/04/16 0555 04/04/16 1217 04/06/16 0530 04/07/16 0521  WBC 4.2 3.9* 5.6 4.9  HGB 9.5* 9.3* 9.2* 8.5*  HCT 29.0* 29.1* 28.6* 27.2*  MCV 89.8 90.1 89.9 89.8  PLT 112* 92* 135* 123*    Basic Metabolic  Panel:  Recent Labs Lab 04/01/16 0559 04/02/16 1350 04/04/16 0703 04/06/16 0530  NA 135 135 133* 135  K 4.4 5.9* 4.4 3.9  CL 99* 101 96* 99*  CO2 GLUCOSE 149* 134* 159* 97  BUN CREATININE 2.01* 3.14* 3.12* 2.91*  2.92*  CALCIUM 7.4* 8.1* 7.7* 7.9*  PHOS  --   --  2.6 1.9*    GFR: Estimated Creatinine Clearance: 17.7 mL/min (by C-G formula based on Cr of 2.91).  Liver Function Tests:  Recent Labs Lab 04/04/16 0703 04/06/16 0530  ALBUMIN 2.0* 2.1*   No results for input(s): LIPASE, AMYLASE in the last 168 hours. No results for input(s): AMMONIA  in the last 168 hours.  Coagulation Profile: No results for input(s): INR, PROTIME in the last 168 hours.  Cardiac Enzymes: No results for input(s): CKTOTAL, CKMB, CKMBINDEX, TROPONINI in the last 168 hours.  BNP (last 3 results) No results for input(s): PROBNP in the last 8760 hours.  HbA1C: No results for input(s): HGBA1C in the last 72 hours.  CBG:  Recent Labs Lab 04/06/16 1007 04/06/16 1145 04/06/16 1647 04/06/16 1953 04/07/16 0744  GLUCAP 94 85 110* 125* 94    Lipid Profile: No results for input(s): CHOL, HDL, LDLCALC, TRIG, CHOLHDL, LDLDIRECT in the last 72 hours.  Thyroid Function Tests: No results for input(s): TSH, T4TOTAL, FREET4, T3FREE, THYROIDAB in the last 72 hours.  Anemia Panel: No results for input(s): VITAMINB12, FOLATE, FERRITIN, TIBC, IRON, RETICCTPCT in the last 72 hours.  Urine analysis:    Component Value Date/Time   COLORURINE YELLOW 03/31/2016 0858   APPEARANCEUR CLOUDY* 03/31/2016 0858   LABSPEC 1.030 03/31/2016 0858   PHURINE 6.0 03/31/2016 0858   GLUCOSEU 250* 03/31/2016 0858   HGBUR MODERATE* 03/31/2016 0858   BILIRUBINUR MODERATE* 03/31/2016 0858   KETONESUR 15* 03/31/2016 0858   PROTEINUR >300* 03/31/2016 0858   NITRITE NEGATIVE 03/31/2016 0858   LEUKOCYTESUR NEGATIVE 03/31/2016 0858    Sepsis Labs: Lactic Acid, Venous    Component Value Date/Time   LATICACIDVEN 0.79 03/30/2016 1703    MICROBIOLOGY: Recent Results (from the past 240 hour(s))  MRSA PCR Screening     Status: None   Collection Time: 03/30/16  6:37 PM  Result Value Ref Range Status   MRSA by PCR NEGATIVE NEGATIVE Final    Comment:        The GeneXpert MRSA Assay (FDA approved for NASAL specimens only), is one component of a comprehensive MRSA colonization surveillance program. It is not intended to diagnose MRSA infection nor to guide or monitor treatment for MRSA infections.   Urine culture     Status: None   Collection Time: 03/31/16  8:58 AM    Result Value Ref Range Status   Specimen Description URINE, CLEAN CATCH  Final   Special Requests NONE  Final   Culture MULTIPLE SPECIES PRESENT, SUGGEST RECOLLECTION  Final   Report Status 04/01/2016 FINAL  Final  Culture, blood (routine x 2)     Status: Abnormal   Collection Time: 04/02/16  4:05 PM  Result Value Ref Range Status   Specimen Description BLOOD HEMODIALYSIS LINE  Final   Special Requests BOTTLES DRAWN AEROBIC AND ANAEROBIC 10CC  Final   Culture  Setup Time   Final    GRAM POSITIVE COCCI IN CLUSTERS IN BOTH AEROBIC AND ANAEROBIC BOTTLES CRITICAL RESULT CALLED TO, READ BACK BY AND VERIFIED WITH: Patsey Berthold RN 13:15 04/03/16 ([email or web address])  Culture STAPHYLOCOCCUS SPECIES (COAGULASE NEGATIVE) (A)  Final   Report Status 04/05/2016 FINAL  Final   Organism ID, Bacteria STAPHYLOCOCCUS SPECIES (COAGULASE NEGATIVE)  Final      Susceptibility   Staphylococcus species (coagulase negative) - MIC*    CIPROFLOXACIN >=8 RESISTANT Resistant     ERYTHROMYCIN <=0.25 SENSITIVE Sensitive     GENTAMICIN <=0.5 SENSITIVE Sensitive     OXACILLIN >=4 RESISTANT Resistant     TETRACYCLINE <=1 SENSITIVE Sensitive     VANCOMYCIN 1 SENSITIVE Sensitive     TRIMETH/SULFA 160 RESISTANT Resistant     CLINDAMYCIN <=0.25 SENSITIVE Sensitive     RIFAMPIN <=0.5 SENSITIVE Sensitive     Inducible Clindamycin NEGATIVE Sensitive     * STAPHYLOCOCCUS SPECIES (COAGULASE NEGATIVE)  Culture, blood (routine x 2)     Status: Abnormal   Collection Time: 04/02/16  4:35 PM  Result Value Ref Range Status   Specimen Description BLOOD RIGHT ARM  Final   Special Requests   Final    BOTTLES DRAWN AEROBIC AND ANAEROBIC 4CC ANA 5CC AER   Culture  Setup Time   Final    GRAM POSITIVE COCCI IN CLUSTERS IN BOTH AEROBIC AND ANAEROBIC BOTTLES CRITICAL RESULT CALLED TO, READ BACK BY AND VERIFIED WITH: Patsey Berthold RN 16:45 04/03/16 ([email or web address])    Culture (A)  Final    STAPHYLOCOCCUS SPECIES (COAGULASE  NEGATIVE) SUSCEPTIBILITIES PERFORMED ON PREVIOUS CULTURE WITHIN THE LAST 5 DAYS.    Report Status 04/05/2016 FINAL  Final  Culture, blood (Routine X 2) w Reflex to ID Panel     Status: None (Preliminary result)   Collection Time: 04/04/16  3:28 PM  Result Value Ref Range Status   Specimen Description BLOOD RIGHT HAND  Final   Special Requests IN PEDIATRIC BOTTLE 0.5CC  Final   Culture NO GROWTH 2 DAYS  Final   Report Status PENDING  Incomplete  Cath Tip Culture     Status: None (Preliminary result)   Collection Time: 04/04/16  5:19 PM  Result Value Ref Range Status   Specimen Description CATH TIP RIGHT NECK  Final   Special Requests NONE  Final   Culture   Final    NO GROWTH 1 DAY Performed at [facility]    Report Status PENDING  Incomplete    RADIOLOGY STUDIES/RESULTS: Dg Chest 1 View  03/30/2016  CLINICAL DATA:  Syncopal episode EXAM: CHEST 1 VIEW COMPARISON:  02/28/2016 FINDINGS: Cardiac shadow is stable. Right jugular dialysis catheter is again seen and stable. The lungs are well aerated bilaterally. No focal infiltrate or sizable effusion is seen. No significant edema is seen. No bony abnormality is noted. IMPRESSION: No active disease. Electronically Signed   By: Alcide Clever M.D.   On: 03/30/2016 16:43   Dg Chest 2 View  04/03/2016  CLINICAL DATA:  Short of breath today.  Pleural effusions. EXAM: CHEST  2 VIEW COMPARISON:  Chest CT, 04/01/2016.  Chest radiograph, 03/30/2016. FINDINGS: Cardiac silhouette is normal in size and configuration. No mediastinal or hilar masses or pathologically enlarged lymph nodes. Right internal jugular dual-lumen tunneled central venous catheter has its tip projecting in the right atrium. This is stable. There are small, right greater than left, pleural effusions. These appear smaller than noted on the recent prior CT. There is no convincing pneumonia or pulmonary edema. No pneumothorax. IMPRESSION: 1. Small, right greater left, pleural  effusions which appears smaller than they were on the recent prior chest CT. 2. No evidence of pneumonia or  pulmonary edema. Electronically Signed   By: Amie Portland M.D.   On: 04/03/2016 12:44   Dg Thoracic Spine 2 View  03/30/2016  CLINICAL DATA:  Upper and mid back pain after falling today. EXAM: THORACIC SPINE 2 VIEWS COMPARISON:  02/28/2016 chest radiograph FINDINGS: Right-sided dialysis catheter with tip at cavoatrial junction or high right atrium. Lateral view images from approximately the bottom of C6 through the bottom of T11. Moderate spondylosis across these levels, without vertebral body height loss. Lower thoracic vertebral body height maintained on lumbar spine radiographs, dictated separately. IMPRESSION: Spondylosis, without acute finding in the thoracic spine. Electronically Signed   By: Jeronimo Greaves M.D.   On: 03/30/2016 16:44   Dg Lumbar Spine Complete  03/30/2016  CLINICAL DATA:  Patient status post fall. Mid and lower back pain. Initial encounter. EXAM: LUMBAR SPINE - COMPLETE 4+ VIEW COMPARISON:  None. FINDINGS: Mild leftward curvature of the lumbar spine. Preservation of the vertebral body and intervertebral disc space heights. SI joints are unremarkable. No evidence for acute lumbar spine fracture. Lower lumbar spine degenerative disc and facet disease. IMPRESSION: Degenerative disc disease.  No acute osseous abnormality. Electronically Signed   By: Annia Belt M.D.   On: 03/30/2016 16:43   Ct Head Wo Contrast  03/30/2016  CLINICAL DATA:  Initial encounter for MULTIPLE RECENT FALLS, TODAY PT. FELL BUT DENIES LOC, GENERALIZED WEAKNESS, HX ESRD-DM-SYNCOPE-HTN-CAD, LW EXAM: CT HEAD WITHOUT CONTRAST CT CERVICAL SPINE WITHOUT CONTRAST TECHNIQUE: Multidetector CT imaging of the head and cervical spine was performed following the standard protocol without intravenous contrast. Multiplanar CT image reconstructions of the cervical spine were also generated. COMPARISON:  Head CT of  12/26/2015. Prior head and cervical spine CTs 12/25/2015. FINDINGS: CT HEAD FINDINGS Sinuses/Soft tissues: No significant soft tissue swelling. Mild ethmoid air cell mucosal thickening. No skull fracture. Clear mastoid air cells. Intracranial: Marked improvement to resolution of previously described subdural collection adjacent the left frontal lobe. Minimal prominence of extra-axial spaces remains, including image 17/series 201. Hypo attenuating. Bilateral vertebral artery atherosclerosis. Moderate low density in the periventricular white matter likely related to small vessel disease. Resolved right-sided extra-axial/subdural fluid collection. 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 No acute large vessel cortically based infarct. No new hemorrhage, intra-axial, or extra-axial fluid collection. No mass lesion. CT CERVICAL SPINE FINDINGS Spinal visualization through the bottom of T2. Prevertebral soft tissues are within normal limits. Left worse than right carotid atherosclerosis. A right-sided central line is incompletely imaged. Small right pleural effusion is similar to 12/25/2015. No apical pneumothorax. Base interlobular septal thickening which suggests interstitial edema. Skull base intact.  Skull base intact. Trace C2-3 anterolisthesis is similar and likely degenerative. Maintenance of vertebral body height. Straightening of expected lordosis. Facets are well-aligned. Multilevel spondylosis, with loss of intervertebral disc height and endplate osteophyte formation. C1-2 articulation demonstrates only degenerative change on coronal reformats. IMPRESSION: 1. No acute intracranial abnormality. Cerebral atrophy is small vessel ischemic change. 2. Resolved right and markedly improved to resolved left-sided subdural fluid collections. There is minimal residual prominence of the left extra-axial space adjacent the frontal lobe. 3. Cervical spondylosis, without acute fracture or subluxation. Straightening  of expected cervical lordosis could be positional, due to muscular spasm, or ligamentous injury. 4. Similar small right pleural effusion. Suspect interstitial edema/congestive heart failure. Electronically Signed   By: Jeronimo Greaves M.D.   On: 03/30/2016 17:36   Ct Chest Wo Contrast  04/01/2016  CLINICAL DATA:  Shortness of breath today in patient admitted  to the hospital with a chief complaint of syncope on 03/30/2016. The patient denies cough. End-stage renal disease. Initial encounter. EXAM: CT CHEST WITHOUT CONTRAST TECHNIQUE: Multidetector CT imaging of the chest was performed following the standard protocol without IV contrast. COMPARISON:  PA and lateral chest 02/28/2016. FINDINGS: The patient has moderate bilateral pleural effusions, greater on the right. Mild cardiomegaly is seen. Small pericardial effusion is identified. Extensive calcific coronary artery disease is present. Dialysis catheter from a right IJ approach is noted. There is no axillary, hilar or mediastinal lymphadenopathy. The lungs demonstrate mild compressive atelectasis secondary to the patient's pleural effusions, slightly greater on the right. There is also some interlobular septal thickening in the upper lobes which is worse on the left. The lungs are otherwise unremarkable. Visualized upper abdomen demonstrates extensive atherosclerosis. No focal abnormality is seen. No focal bony abnormality is identified. IMPRESSION: Moderate bilateral pleural effusions, larger on the right. Interlobular septal thickening in the upper lobes bilaterally is compatible with mild edema. Small pericardial effusion. Extensive calcific coronary artery disease. Electronically Signed   By: Drusilla Kannerhomas  Dalessio M.D.   On: 04/01/2016 13:15   Ct Cervical Spine Wo Contrast  03/30/2016  CLINICAL DATA:  Initial encounter for MULTIPLE RECENT FALLS, TODAY PT. FELL BUT DENIES LOC, GENERALIZED WEAKNESS, HX ESRD-DM-SYNCOPE-HTN-CAD, LW EXAM: CT HEAD WITHOUT CONTRAST CT  CERVICAL SPINE WITHOUT CONTRAST TECHNIQUE: Multidetector CT imaging of the head and cervical spine was performed following the standard protocol without intravenous contrast. Multiplanar CT image reconstructions of the cervical spine were also generated. COMPARISON:  Head CT of 12/26/2015. Prior head and cervical spine CTs 12/25/2015. FINDINGS: CT HEAD FINDINGS Sinuses/Soft tissues: No significant soft tissue swelling. Mild ethmoid air cell mucosal thickening. No skull fracture. Clear mastoid air cells. Intracranial: Marked improvement to resolution of previously described subdural collection adjacent the left frontal lobe. Minimal prominence of extra-axial spaces remains, including image 17/series 201. Hypo attenuating. Bilateral vertebral artery atherosclerosis. Moderate low density in the periventricular white matter likely related to small vessel disease. Resolved right-sided extra-axial/subdural fluid collection. 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 No acute large vessel cortically based infarct. No new hemorrhage, intra-axial, or extra-axial fluid collection. No mass lesion. CT CERVICAL SPINE FINDINGS Spinal visualization through the bottom of T2. Prevertebral soft tissues are within normal limits. Left worse than right carotid atherosclerosis. A right-sided central line is incompletely imaged. Small right pleural effusion is similar to 12/25/2015. No apical pneumothorax. Base interlobular septal thickening which suggests interstitial edema. Skull base intact.  Skull base intact. Trace C2-3 anterolisthesis is similar and likely degenerative. Maintenance of vertebral body height. Straightening of expected lordosis. Facets are well-aligned. Multilevel spondylosis, with loss of intervertebral disc height and endplate osteophyte formation. C1-2 articulation demonstrates only degenerative change on coronal reformats. IMPRESSION: 1. No acute intracranial abnormality. Cerebral atrophy is small vessel  ischemic change. 2. Resolved right and markedly improved to resolved left-sided subdural fluid collections. There is minimal residual prominence of the left extra-axial space adjacent the frontal lobe. 3. Cervical spondylosis, without acute fracture or subluxation. Straightening of expected cervical lordosis could be positional, due to muscular spasm, or ligamentous injury. 4. Similar small right pleural effusion. Suspect interstitial edema/congestive heart failure. Electronically Signed   By: Jeronimo GreavesKyle  Talbot M.D.   On: 03/30/2016 17:36   Mr Lumbar Spine Wo Contrast  04/05/2016  CLINICAL DATA:  75 year old male with end-stage renal disease, back pain since December. Falls. Bacteremia. Initial encounter. EXAM: MRI LUMBAR SPINE WITHOUT CONTRAST TECHNIQUE: Multiplanar, multisequence MR  imaging of the lumbar spine was performed. No intravenous contrast was administered. COMPARISON:  Lumbar radiographs 03/30/2016. FINDINGS: Normal lumbar segmentation demonstrated on the comparison. The patient was unable to lie flat. Subsequently The examination had to be discontinued prior to completion. Only sagittal imaging could be obtained, and is motion degraded. Diffusely decreased T1 and T2 bone marrow signal. Abnormal STIR hyperintensity in the L4 inferior endplate and posterior vertebral body (series 4, image 3 and series 3, image 10. There is also mild associated abnormal increased STIR signal in the adjacent L5 superior endplate, but this appears somewhat focal like a Schmorl node. The posterior vertebral body signal abnormality appears somewhat contiguous with the abnormal intervertebral disc region signal. This could represent a leftward disc extrusion extending into the left L4 neural foramen. No evidence of acute osseous abnormality at the other visualized levels. No significant lumbar spinal stenosis. IMPRESSION: 1. The examination had to be discontinued prior to completion due to the patient's inability to lie flat. 2.  Abnormal signal in the L4 disc and adjacent L4-L5 endplates. However, favor this is due to a disc herniation extending into the left L4 neural foramen. Is there lateralizing left hip or lower extremity pain, such as due to left L4 radiculitis? 3. L4-L5 discitis osteomyelitis felt less likely. Repeat lumbar MRI to include axial imaging may be confirmatory when possible. 4. 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 Electronically Signed   By: Odessa Fleming M.D.   On: 04/05/2016 17:46   Dg Chest Port 1 View  04/06/2016  CLINICAL DATA:  Central line placement, status post dialysis catheter insertion EXAM: PORTABLE CHEST 1 VIEW COMPARISON:  Chest radiograph dated 04/05/2016 FINDINGS: Lungs are clear.  No pleural effusion or pneumothorax. The heart is top-normal in size. Left IJ dual lumen dialysis catheter with its distal tip in the upper right atrium. IMPRESSION: Left-sided dual lumen dialysis catheter with its distal tip in the upper right atrium. No pneumothorax. Electronically Signed   By: Charline Bills M.D.   On: 04/06/2016 10:11   Dg Chest Port 1 View  04/05/2016  CLINICAL DATA:  Shortness of Breath EXAM: PORTABLE CHEST 1 VIEW COMPARISON:  04/03/2016 FINDINGS: Cardiac shadow is stable. The previously seen dialysis catheter has been removed in the interval. The lungs are well aerated bilaterally. Mild increased density is noted over both lungs which may be related to small effusions. No focal infiltrate is seen. No bony abnormality is noted. IMPRESSION: Question small effusions. Clinical correlation is recommended. Upright films when the patient is able are also recommended. Electronically Signed   By: Alcide Clever M.D.   On: 04/05/2016 18:26   Dg Fluoro Guide Cv Line-no Report  04/06/2016  CLINICAL DATA:  FLOURO GUIDE CV LINE Fluoroscopy was utilized by the requesting physician.  No radiographic interpretation.     LOS: 5 days   Jeoffrey Massed, MD  [facility] Pager:[phone]  If 7PM-7AM, please contact night-coverage [email or web address] Password [email or web address] 04/07/2016, 11:06 AM

## 2016-04-08 NOTE — Interval H&P Note (Signed)
History and Physical Interval Note:  04/08/2016 1:18 PM  Jeffrey Walls  has presented today for surgery, with the diagnosis of bacteremia  The various methods of treatment have been discussed with the patient and family. After consideration of risks, benefits and other options for treatment, the patient has consented to  Procedure(s): TRANSESOPHAGEAL ECHOCARDIOGRAM (TEE) (N/A) as a surgical intervention .  The patient's history has been reviewed, patient examined, no change in status, stable for surgery.  I have reviewed the patient's chart and labs.  Questions were answered to the patient's satisfaction.     Asami Lambright [employer]

## 2016-04-08 NOTE — Progress Notes (Signed)
INFECTIOUS DISEASE PROGRESS NOTE  ID: Jeffrey Walls is a 75 y.o. male with  Principal Problem:   Bacteremia, coagulase-negative staphylococcal Active Problems:   ESRD on hemodialysis (HCC)   Anemia of renal disease   Frequent falls   Essential hypertension   Diabetes mellitus with diabetic nephropathy, with long-term current use of insulin (HCC)   Protein-calorie malnutrition, severe   Syncope   Elevated troponin   Acute combined systolic and diastolic heart failure (HCC)   ESRD (end stage renal disease) (HCC)  Subjective: Still feels poorly.   Abtx:  Anti-infectives    Start     Dose/Rate Route Frequency Ordered Stop   04/07/16 1003  vancomycin (VANCOCIN) 500 MG/100ML IVPB    Comments:  Woods, [name]   : cabinet override      04/07/16 1003 04/07/16 1034   04/06/16 0715  vancomycin (VANCOCIN) IVPB 1000 mg/200 mL premix  Status:  Discontinued     1,000 mg 200 mL/hr over 60 Minutes Intravenous To ShortStay [facility] 04/06/16 0616 04/06/16 0621   04/04/16 1200  vancomycin (VANCOCIN) 500 mg in sodium chloride 0.9 % 100 mL IVPB     500 mg 100 mL/hr over 60 Minutes Intravenous Every M-W-F (Hemodialysis) 04/02/16 1630     04/02/16 1800  cefTAZidime (FORTAZ) 2 g in dextrose 5 % 50 mL IVPB  Status:  Discontinued     2 g 100 mL/hr over 30 Minutes Intravenous Every M-W-F (1800) 04/02/16 1624 04/04/16 1148   04/02/16 1630  vancomycin (VANCOCIN) IVPB 1000 mg/200 mL premix     1,000 mg 200 mL/hr over 60 Minutes Intravenous  Once 04/02/16 1623 04/02/16 1740      Medications:  Scheduled: . albumin human  25 g Intravenous Once  . aspirin EC  81 mg Oral Daily  . citalopram  20 mg Oral Daily  . darbepoetin (ARANESP) injection - DIALYSIS  100 mcg Intravenous Q Mon-HD  . enoxaparin (LOVENOX) injection  30 mg Subcutaneous Q24H  . feeding supplement  1 Container Oral BID BM  . feeding supplement (PRO-STAT SUGAR FREE 64)  30 mL Oral BID  . folic acid  1 mg Oral Daily  . insulin aspart   0-9 Units Subcutaneous TID WC  . lisinopril  10 mg Oral Daily  . metoprolol succinate  25 mg Oral Daily  . pantoprazole  20 mg Oral Daily  . sodium chloride flush  3 mL Intravenous Q12H  . vancomycin  500 mg Intravenous Q M,W,F-HD    Objective: Vital signs in last 24 hours: Temp:  [97.8 F (36.6 C)-98.5 F (36.9 C)] 97.8 F (36.6 C) (04/25 1206) Pulse Rate:  [64-103] 69 (04/25 1350) Resp:  [9-18] 18 (04/25 1350) BP: (102-204)/(50-74) 176/66 mmHg (04/25 1350) SpO2:  [96 %-100 %] 96 % (04/25 1350) Weight:  [53 kg (116 lb 13.5 oz)] 53 kg (116 lb 13.5 oz) (04/24 2142)   General appearance: alert, cooperative and no distress Resp: clear to auscultation bilaterally Cardio: regular rate and rhythm GI: normal findings: bowel sounds normal and soft, non-tender  Lab Results  Recent Labs  04/06/16 0530 04/07/16 0521  WBC 5.6 4.9  HGB 9.2* 8.5*  HCT 28.6* 27.2*  NA 135  --   K 3.9  --   CL 99*  --   CO2 26  --   BUN 14  --   CREATININE 2.91*  2.92*  --    Liver Panel  Recent Labs  04/06/16 0530  ALBUMIN 2.1*   Sedimentation  Rate No results for input(s): ESRSEDRATE in the last 72 hours. C-Reactive Protein No results for input(s): CRP in the last 72 hours.  Microbiology: Recent Results (from the past 240 hour(s))  MRSA PCR Screening     Status: None   Collection Time: 03/30/16  6:37 PM  Result Value Ref Range Status   MRSA by PCR NEGATIVE NEGATIVE Final    Comment:        The GeneXpert MRSA Assay (FDA approved for NASAL specimens only), is one component of a comprehensive MRSA colonization surveillance program. It is not intended to diagnose MRSA infection nor to guide or monitor treatment for MRSA infections.   Urine culture     Status: None   Collection Time: 03/31/16  8:58 AM  Result Value Ref Range Status   Specimen Description URINE, CLEAN CATCH  Final   Special Requests NONE  Final   Culture MULTIPLE SPECIES PRESENT, SUGGEST RECOLLECTION  Final    Report Status 04/01/2016 FINAL  Final  Culture, blood (routine x 2)     Status: Abnormal   Collection Time: 04/02/16  4:05 PM  Result Value Ref Range Status   Specimen Description BLOOD HEMODIALYSIS LINE  Final   Special Requests BOTTLES DRAWN AEROBIC AND ANAEROBIC 10CC  Final   Culture  Setup Time   Final    GRAM POSITIVE COCCI IN CLUSTERS IN BOTH AEROBIC AND ANAEROBIC BOTTLES CRITICAL RESULT CALLED TO, READ BACK BY AND VERIFIED WITH: Patsey Berthold RN 13:15 04/03/16 ([email or web address])    Culture STAPHYLOCOCCUS SPECIES (COAGULASE NEGATIVE) (A)  Final   Report Status 04/05/2016 FINAL  Final   Organism ID, Bacteria STAPHYLOCOCCUS SPECIES (COAGULASE NEGATIVE)  Final      Susceptibility   Staphylococcus species (coagulase negative) - MIC*    CIPROFLOXACIN >=8 RESISTANT Resistant     ERYTHROMYCIN <=0.25 SENSITIVE Sensitive     GENTAMICIN <=0.5 SENSITIVE Sensitive     OXACILLIN >=4 RESISTANT Resistant     TETRACYCLINE <=1 SENSITIVE Sensitive     VANCOMYCIN 1 SENSITIVE Sensitive     TRIMETH/SULFA 160 RESISTANT Resistant     CLINDAMYCIN <=0.25 SENSITIVE Sensitive     RIFAMPIN <=0.5 SENSITIVE Sensitive     Inducible Clindamycin NEGATIVE Sensitive     * STAPHYLOCOCCUS SPECIES (COAGULASE NEGATIVE)  Culture, blood (routine x 2)     Status: Abnormal   Collection Time: 04/02/16  4:35 PM  Result Value Ref Range Status   Specimen Description BLOOD RIGHT ARM  Final   Special Requests   Final    BOTTLES DRAWN AEROBIC AND ANAEROBIC 4CC ANA 5CC AER   Culture  Setup Time   Final    GRAM POSITIVE COCCI IN CLUSTERS IN BOTH AEROBIC AND ANAEROBIC BOTTLES CRITICAL RESULT CALLED TO, READ BACK BY AND VERIFIED WITH: Patsey Berthold RN 16:45 04/03/16 ([email or web address])    Culture (A)  Final    STAPHYLOCOCCUS SPECIES (COAGULASE NEGATIVE) SUSCEPTIBILITIES PERFORMED ON PREVIOUS CULTURE WITHIN THE LAST 5 DAYS.    Report Status 04/05/2016 FINAL  Final  Culture, blood (Routine X 2) w Reflex to ID Panel     Status: None (Preliminary  result)   Collection Time: 04/04/16  3:28 PM  Result Value Ref Range Status   Specimen Description BLOOD RIGHT HAND  Final   Special Requests IN PEDIATRIC BOTTLE 0.5CC  Final   Culture NO GROWTH 4 DAYS  Final   Report Status PENDING  Incomplete  Cath Tip Culture     Status: None   Collection Time: 04/04/16  5:19 PM  Result Value Ref Range Status   Specimen Description CATH TIP RIGHT NECK  Final   Special Requests NONE  Final   Culture   Final    NO GROWTH 2 DAYS Performed at [facility]    Report Status 04/07/2016 FINAL  Final    Studies/Results: No results found.   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  Available as needed.        Johny Sax Infectious Diseases (pager) [phone] [email or web address] 04/08/2016, 3:40 PM  LOS: 6 days

## 2016-04-08 NOTE — Progress Notes (Signed)
OT Cancellation Note  Patient Details Name: Jeffrey Walls MRN: [id number] DOB: 24-Jul-1941   Cancelled Treatment:    Reason Eval/Treat Not Completed: Patient at procedure or test/ unavailable (ENDO)  Felecia ShellingJones, Sostenes Kauffmann B   Landree Fernholz, Brynn   OTR/L Pager: [phone] Office: [phone] .  04/08/2016, 3:45 PM

## 2016-04-08 NOTE — Progress Notes (Signed)
Nutrition Follow-up  DOCUMENTATION CODES:   Severe malnutrition in context of chronic illness, Underweight  INTERVENTION:  Continue Boost Breeze po BID, each supplement provides 250 kcal and 9 grams of protein.  Continue 30 ml Prostat po BID, each supplement provides 100 kcal and 15 grams of protein.   Encourage adequate PO intake.   NUTRITION DIAGNOSIS:   Malnutrition related to chronic illness as evidenced by severe depletion of body fat, severe depletion of muscle mass; ongoing  GOAL:   Patient will meet greater than or equal to 90% of their needs; progressing  MONITOR:   PO intake, Supplement acceptance, Weight trends, Labs, I & O's  REASON FOR ASSESSMENT:   Malnutrition Screening Tool    ASSESSMENT:   75 y.o. male with a Past Medical history that includes diabetes type 2, hypertension, coronary artery disease, end-stage renal disease on Monday Wednesday Friday dialysis presents to the emergency Department chief complaint of syncope. Initial evaluation reveals elevated troponin, hypokalemia.    Diet has been advanced. TEE done. Meal completion has been varied form 25-80%. Pt currently has Nepro shake and Prostat ordered with varied consumption. RD to continue with current orders to aid in caloric and protein needs. Pt encouraged to eat his food at meals.   Labs and medications reviewed.   Diet Order:  Diet renal/carb modified with fluid restriction Diet-HS Snack?: Nothing; Room service appropriate?: Yes; Fluid consistency:: Thin  Skin:  Reviewed, no issues  Last BM:  4/21  Height:   Ht Readings from Last 1 Encounters:  04/02/16 5\' 9"  (1.753 m)    Weight:   Wt Readings from Last 1 Encounters:  04/07/16 116 lb 13.5 oz (53 kg)    Ideal Body Weight:  72.7 kg  BMI:  Body mass index is 17.25 kg/(m^2).  Estimated Nutritional Needs:   Kcal:  1850-2050  Protein:  85-95 grams  Fluid:  1.2 L/day  EDUCATION NEEDS:   No education needs identified at this  time  Roslyn SmilingStephanie Azora Bonzo, MS, RD, LDN Pager # [phone] After hours/ weekend pager # [phone]

## 2016-04-08 NOTE — CV Procedure (Addendum)
Procedure: TEE  Indication: RA mass, bacteremia  Sedation: Versed 2 mg IV, Fentanyl 25 mcg   Findings: Please see echo section for full report.  Normal LV size with mild LV hypertrophy.  EF 35-40%, diffuse hypokinesis.  Normal RV size and systolic function.  Moderate left atrial enlargement, no LA appendage thrombus.  Normal RA size.  There is a mass attached to the right atrial free wall that measures 3 x 2 cm.  It does not involve the mitral valve.  There is a dialysis catheter ending in the RA that is in close proximity to the mass but does to appear to have any attached vegetation. Mild mitral regurgitation, no vegetation.  No significant TR, no involvement of TV by vegetation.  Normal pulmonic valve with no vegetation.  Trileaflet aortic valve with mild calcification.  No AI or AS, no vegetation.  Normal aorta caliber with grade III plaque descending thoracic aorta.   Impression: RA mass attached to free wall, 3 x 2 mm.  This does not involve the tricuspid valve.  This may be a vegetation caused by impingement of the prior infected HD catheter on this area of the RA free wall.  Of note, new HD catheter is in close proximity.  Cannot rule out RA myxoma.  Therefore, would re-image after he has completed antibiotic treatment.   Marca AnconaDalton Syble Picco 04/08/2016

## 2016-04-09 ENCOUNTER — Encounter (HOSPITAL_COMMUNITY): Payer: Self-pay | Admitting: Cardiology

## 2016-04-09 LAB — RENAL FUNCTION PANEL
Albumin: 2.2 g/dL — ABNORMAL LOW (ref 3.5–5.0)
Anion gap: 6 (ref 5–15)
BUN: 23 mg/dL — ABNORMAL HIGH (ref 6–20)
CO2: 28 mmol/L (ref 22–32)
Calcium: 7.9 mg/dL — ABNORMAL LOW (ref 8.9–10.3)
Chloride: 100 mmol/L — ABNORMAL LOW (ref 101–111)
Creatinine, Ser: 3.17 mg/dL — ABNORMAL HIGH (ref 0.61–1.24)
GFR calc Af Amer: 21 mL/min — ABNORMAL LOW (ref >60)
GFR calc non Af Amer: 18 mL/min — ABNORMAL LOW (ref >60)
Glucose, Bld: 97 mg/dL (ref 65–99)
Phosphorus: 3.4 mg/dL (ref 2.5–4.6)
Potassium: 3.9 mmol/L (ref 3.5–5.1)
Sodium: 134 mmol/L — ABNORMAL LOW (ref 135–145)

## 2016-04-09 LAB — VANCOMYCIN, RANDOM: VANCOMYCIN RM: 15 ug/mL

## 2016-04-09 LAB — GLUCOSE, CAPILLARY
GLUCOSE-CAPILLARY: 86 mg/dL (ref 65–99)
GLUCOSE-CAPILLARY: 69 mg/dL (ref 65–99)
GLUCOSE-CAPILLARY: 85 mg/dL (ref 65–99)
Glucose-Capillary: 122 mg/dL — ABNORMAL HIGH (ref 65–99)

## 2016-04-09 LAB — CBC
HCT: 27.6 % — ABNORMAL LOW (ref 39.0–52.0)
Hemoglobin: 8.7 g/dL — ABNORMAL LOW (ref 13.0–17.0)
MCH: 28.2 pg (ref 26.0–34.0)
MCHC: 31.5 g/dL (ref 30.0–36.0)
MCV: 89.6 fL (ref 78.0–100.0)
Platelets: 137 10*3/uL — ABNORMAL LOW (ref 150–400)
RBC: 3.08 MIL/uL — ABNORMAL LOW (ref 4.22–5.81)
RDW: 20.2 % — ABNORMAL HIGH (ref 11.5–15.5)
WBC: 5.4 10*3/uL (ref 4.0–10.5)

## 2016-04-09 LAB — CULTURE, BLOOD (ROUTINE X 2): Culture: NO GROWTH

## 2016-04-09 MED ORDER — LIDOCAINE HCL (PF) 1 % IJ SOLN: 5.0000 mL | INTRAMUSCULAR | Status: DC | PRN

## 2016-04-09 MED ORDER — PENTAFLUOROPROP-TETRAFLUOROETH EX AERO: 1.0000 "application " | INHALATION_SPRAY | CUTANEOUS | Status: DC | PRN

## 2016-04-09 MED ORDER — HEPARIN SODIUM (PORCINE) 1000 UNIT/ML DIALYSIS: 1000.0000 [IU] | INTRAMUSCULAR | Status: DC | PRN

## 2016-04-09 MED ORDER — OXYCODONE HCL 5 MG PO TABS
ORAL_TABLET | ORAL | Status: AC
  Filled 2016-04-09: qty 1

## 2016-04-09 MED ORDER — SODIUM CHLORIDE 0.9 % IV SOLN: 100.0000 mL | INTRAVENOUS | Status: DC | PRN

## 2016-04-09 MED ORDER — VANCOMYCIN HCL IN DEXTROSE 500-5 MG/100ML-% IV SOLN
INTRAVENOUS | Status: AC
  Administered 2016-04-09: 500 mg
  Filled 2016-04-09: qty 100

## 2016-04-09 MED ORDER — ACETAMINOPHEN 500 MG PO TABS
1000.0000 mg | ORAL_TABLET | Freq: Three times a day (TID) | ORAL | Status: DC
  Administered 2016-04-09 – 2016-04-10 (×4): 1000 mg via ORAL
  Filled 2016-04-09 (×5): qty 2

## 2016-04-09 MED ORDER — ALTEPLASE 2 MG IJ SOLR: 2.0000 mg | Freq: Once | INTRAMUSCULAR | Status: DC | PRN

## 2016-04-09 MED ORDER — LIDOCAINE-PRILOCAINE 2.5-2.5 % EX CREA: 1.0000 "application " | TOPICAL_CREAM | CUTANEOUS | Status: DC | PRN

## 2016-04-09 MED ORDER — OXYCODONE HCL 5 MG PO TABS: 5.0000 mg | ORAL_TABLET | Freq: Four times a day (QID) | ORAL | Status: DC | PRN

## 2016-04-09 NOTE — Progress Notes (Signed)
PROGRESS NOTE        PATIENT DETAILS Name: Jeffrey Walls Age: 75 y.o. Sex: male Date of Birth: 20-May-1941 Admit Date: 03/30/2016 Admitting Physician Haydee Salter, MD ZOX:[id number], MD  Brief Narrative: Patient is a 75 y.o. 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 Hospital course complicated by development of acute systolic heart failure and fever on 4/19 with blood cultures positive for coag-negative staph-felt to be secondary to dialysis catheter related blood stream infection.Also found to have a right atrial mass on transthoracic echocardiogram on 4/19. Hemodialysis catheter has been removed by vascular surgery.TEE on 4/24 confirmed atrial mass and revealed EF of 35-40%. Discharge is being held because patient is unable to tolerate HD in a seated position (necessary for outpatient dialysis) secondary to back pain. See below for further details.   Subjective: Patient doing well. Complaining of back pain.   Assessment/Plan: Principal Problem: Bacteremia, coagulase-negative staphylococcal: Probably secondary to hemodialysis catheter-related bloodstream infection. Hemodialysis catheter has been removed on 4/21, repeat blood cultures on 4/21 are negative, cath tip cultures are also negative so far. Infectious disease following, recommendations are to continue intravenous vancomycin for 6 weeks. MRI of the lumbosacral spine-although incomplete study-does not show discitis/osteomyelitis. TEE confirms a right atrial mass.  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 Echo shows mildly decreased EF around 40%. Lower Ext Doppler negative.Telemetry negative. Spoke with cardiologist-Dr. Adella Hare reviewed chart, did not advise anticoagulate patient in this  setting.   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 TEE confirmed atrial mass -which is likely related to infected HD catheter or may be a vegetation-recommendations from cardiology are to repeat TEE after he completes antibiotic course   Acute systolic heart failure: compensated.EF by TEE on 4/25 35-40%, diffuse hypokinesis. Volume removal hemodialysis. Nephrology slowly decreasing dry weight.   Bilateral pleural effusion: Suspect related to CHF. Volume removal with hemodialysis. Doubt needs thoracocentesis at this point-as chest x-ray on 4/23 demonstrates no pleural effusion.  Elevated troponin: Likely false-positive elevation in the setting of ESRD-trend is flat not consistent with ACS. However does have known history of CAD. Will plan on medical treatment   Pulmonary hypertension: PA pressure of 60 mmHg via transthoracic echocardiogram, reviewed outpatient echocardiogram (September 2016) in care everywhere-  showed mild pulmonary hypertension. Suspect this is related to underlying Anca vasculitis.   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 Nephrology following   Hypertension:  BP controlled, continue lisinopril and metoprolol-may need to further adjust dosing depending on post dialysis dry weight.  Type 2 diabetes: CBGs stable-continue SSI.   Back pain:  Given bacteremia- MRI lumbosacral spine done on 4/22-although incomplete study-no discitis/osteomyelitis-but does show disc herniation at L4-L5. Continue supportive care. Cannot be discharged due to back pain preventing him from being in seated position that is necessary for outpatient dialysis.   Anemia of renal disease: Management as per nephrology  Thrombocytopenia:Could be related to bacteremia,  improving.   Frequent falls: non focal exam-check orthostatics-PT eval  Protein-calorie malnutrition, severe: Continue supplements.  DVT Prophylaxis: Prophylactic Lovenox   Code Status: Full code   Family Communication: None at bedside  Disposition Plan: Remain inpatient- SNF on discharge when able to sit up for HD  Antimicrobial agents: IV Vanco 4/19>> IV Elita Quick 4/19>>4/21  Procedures: Echo 4/19>>Moderate aortic regurg, ejection fraction 40-45 percent, grade 2 diastolic dysfunction-with right atrial mass 4/21>> hemodialysis catheter removed 4/23>>Ultrasound-guided insertion of Diatek catheter, left internal jugular vein 4/25>>TEE-Right atrial mass  CONSULTS:  cardiology  Time spent: 25 minutes-Greater than 50% of this time was spent in counseling, explanation of diagnosis, planning of further management, and coordination of care.  MEDICATIONS: Anti-infectives    Start     Dose/Rate Route Frequency Ordered Stop   04/09/16 0838  vancomycin (VANCOCIN) 500-5 MG/100ML-% IVPB    Comments:  Roosvelt Harps   : cabinet override      04/09/16 0838 04/09/16 1032   04/07/16 1003  vancomycin (VANCOCIN) 500 MG/100ML IVPB    Comments:  Woods, [name]   : cabinet override      04/07/16 1003 04/07/16 1034   04/06/16 0715  vancomycin (VANCOCIN) IVPB 1000 mg/200 mL premix  Status:  Discontinued     1,000 mg 200 mL/hr over 60 Minutes Intravenous To [facility] 04/06/16 0616 04/06/16 0621   04/04/16 1200  vancomycin (VANCOCIN) 500 mg in sodium chloride 0.9 % 100 mL IVPB     500 mg 100 mL/hr over 60 Minutes Intravenous Every M-W-F (Hemodialysis) 04/02/16 1630     04/02/16 1800  cefTAZidime (FORTAZ) 2 g in dextrose 5 % 50 mL IVPB  Status:  Discontinued     2 g 100 mL/hr over 30 Minutes Intravenous Every M-W-F (1800) 04/02/16 1624 04/04/16 1148   04/02/16 1630  vancomycin (VANCOCIN) IVPB 1000 mg/200 mL premix     1,000 mg 200 mL/hr over 60 Minutes Intravenous  Once 04/02/16  1623 04/02/16 1740      Scheduled Meds: . albumin human  25 g Intravenous Once  . aspirin EC  81 mg Oral Daily  . citalopram  20 mg Oral Daily  . darbepoetin (ARANESP) injection - DIALYSIS  100 mcg Intravenous Q Mon-HD  . enoxaparin (LOVENOX) injection  30 mg Subcutaneous Q24H  . feeding supplement  1 Container Oral BID BM  . feeding supplement (PRO-STAT SUGAR FREE 64)  30 mL Oral BID  . folic acid  1 mg Oral Daily  . insulin aspart  0-9 Units Subcutaneous TID WC  . lisinopril  10 mg Oral Daily  . metoprolol succinate  25 mg Oral Daily  . oxyCODONE      . pantoprazole  20 mg Oral Daily  . sodium chloride flush  3 mL Intravenous Q12H  . vancomycin  500 mg Intravenous Q M,W,F-HD   Continuous Infusions:  PRN Meds:.sodium chloride, sodium chloride, acetaminophen **OR** acetaminophen, ALPRAZolam, alteplase, heparin, lidocaine (PF), lidocaine-prilocaine, morphine injection, ondansetron **OR** ondansetron (ZOFRAN) IV, oxyCODONE, oxyCODONE-acetaminophen, pentafluoroprop-tetrafluoroeth, senna-docusate, zolpidem   PHYSICAL EXAM: Vital signs: Filed Vitals:   04/09/16 0930 04/09/16 1000 04/09/16 1030 04/09/16 1053  BP: 135/68 102/57 133/65 136/69  Pulse: 66 74 70 76  Temp:      TempSrc:      Resp:      Height:      Weight:      SpO2:       Filed Weights   04/07/16 1143 04/07/16 2142 04/09/16 0732  Weight: 52.3 kg (115 lb 4.8 oz) 53 kg (116 lb 13.5 oz) 52.7 kg (116 lb 2.9 oz)  Body mass index is 17.15 kg/(m^2).   Gen Exam: Awake and alert with clear speech. Not in any distress  Neck: Supple, No JVD.  Chest: B/L Clear. No rales or rhonchi  CVS: S1 S2 Regular Abdomen: soft, BS +, non tender, non distended.  Extremities: no edema, lower extremities warm to touch. Neurologic: Non Focal.   Skin: No Rash or lesions   Wounds: N/A.    LABORATORY DATA: CBC:  Recent Labs Lab 04/04/16 0555 04/04/16 1217 04/06/16 0530 04/07/16 0521 04/09/16 0755  WBC 4.2 3.9* 5.6 4.9 5.4  HGB  9.5* 9.3* 9.2* 8.5* 8.7*  HCT 29.0* 29.1* 28.6* 27.2* 27.6*  MCV 89.8 90.1 89.9 89.8 89.6  PLT 112* 92* 135* 123* 137*    Basic Metabolic Panel:  Recent Labs Lab 04/02/16 1350 04/04/16 0703 04/06/16 0530 04/09/16 0754  NA 135 133* 135 134*  K 5.9* 4.4 3.9 3.9  CL 101 96* 99* 100*  CO2 GLUCOSE 134* 159* 97 97  BUN 23*  CREATININE 3.14* 3.12* 2.91*  2.92* 3.17*  CALCIUM 8.1* 7.7* 7.9* 7.9*  PHOS  --  2.6 1.9* 3.4    GFR: Estimated Creatinine Clearance: 15.5 mL/min (by C-G formula based on Cr of 3.17).  Liver Function Tests:  Recent Labs Lab 04/04/16 0703 04/06/16 0530 04/09/16 0754  ALBUMIN 2.0* 2.1* 2.2*   No results for input(s): LIPASE, AMYLASE in the last 168 hours. No results for input(s): AMMONIA in the last 168 hours.  Coagulation Profile: No results for input(s): INR, PROTIME in the last 168 hours.  Cardiac Enzymes: No results for input(s): CKTOTAL, CKMB, CKMBINDEX, TROPONINI in the last 168 hours.  BNP (last 3 results) No results for input(s): PROBNP in the last 8760 hours.  HbA1C: No results for input(s): HGBA1C in the last 72 hours.  CBG:  Recent Labs Lab 04/07/16 2140 04/08/16 0838 04/08/16 1155 04/08/16 1605 04/08/16 2121  GLUCAP 113* 82 81 110* 132*    Lipid Profile: No results for input(s): CHOL, HDL, LDLCALC, TRIG, CHOLHDL, LDLDIRECT in the last 72 hours.  Thyroid Function Tests: No results for input(s): TSH, T4TOTAL, FREET4, T3FREE, THYROIDAB in the last 72 hours.  Anemia Panel: No results for input(s): VITAMINB12, FOLATE, FERRITIN, TIBC, IRON, RETICCTPCT in the last 72 hours.  Urine analysis:    Component Value Date/Time   COLORURINE YELLOW 03/31/2016 0858   APPEARANCEUR CLOUDY* 03/31/2016 0858   LABSPEC 1.030 03/31/2016 0858   PHURINE 6.0 03/31/2016 0858   GLUCOSEU 250* 03/31/2016 0858   HGBUR MODERATE* 03/31/2016 0858   BILIRUBINUR MODERATE* 03/31/2016 0858   KETONESUR 15* 03/31/2016 0858    PROTEINUR >300* 03/31/2016 0858   NITRITE NEGATIVE 03/31/2016 0858   LEUKOCYTESUR NEGATIVE 03/31/2016 0858    Sepsis Labs: Lactic Acid, Venous    Component Value Date/Time   LATICACIDVEN 0.79 03/30/2016 1703    MICROBIOLOGY: Recent Results (from the past 240 hour(s))  MRSA PCR Screening     Status: None   Collection Time: 03/30/16  6:37 PM  Result Value Ref Range Status   MRSA by PCR NEGATIVE NEGATIVE Final    Comment:        The GeneXpert MRSA Assay (FDA approved for NASAL specimens only), is one component of a comprehensive MRSA colonization surveillance program. It is not intended to diagnose MRSA infection nor to guide or monitor treatment for MRSA infections.   Urine culture     Status: None   Collection Time: 03/31/16  8:58  AM  Result Value Ref Range Status   Specimen Description URINE, CLEAN CATCH  Final   Special Requests NONE  Final   Culture MULTIPLE SPECIES PRESENT, SUGGEST RECOLLECTION  Final   Report Status 04/01/2016 FINAL  Final  Culture, blood (routine x 2)     Status: Abnormal   Collection Time: 04/02/16  4:05 PM  Result Value Ref Range Status   Specimen Description BLOOD HEMODIALYSIS LINE  Final   Special Requests BOTTLES DRAWN AEROBIC AND ANAEROBIC 10CC  Final   Culture  Setup Time   Final    GRAM POSITIVE COCCI IN CLUSTERS IN BOTH AEROBIC AND ANAEROBIC BOTTLES CRITICAL RESULT CALLED TO, READ BACK BY AND VERIFIED WITH: Patsey Berthold. Eckelmann RN 13:15 04/03/16 ([email or web address])    Culture STAPHYLOCOCCUS SPECIES (COAGULASE NEGATIVE) (A)  Final   Report Status 04/05/2016 FINAL  Final   Organism ID, Bacteria STAPHYLOCOCCUS SPECIES (COAGULASE NEGATIVE)  Final      Susceptibility   Staphylococcus species (coagulase negative) - MIC*    CIPROFLOXACIN >=8 RESISTANT Resistant     ERYTHROMYCIN <=0.25 SENSITIVE Sensitive     GENTAMICIN <=0.5 SENSITIVE Sensitive     OXACILLIN >=4 RESISTANT Resistant     TETRACYCLINE <=1 SENSITIVE Sensitive     VANCOMYCIN 1 SENSITIVE  Sensitive     TRIMETH/SULFA 160 RESISTANT Resistant     CLINDAMYCIN <=0.25 SENSITIVE Sensitive     RIFAMPIN <=0.5 SENSITIVE Sensitive     Inducible Clindamycin NEGATIVE Sensitive     * STAPHYLOCOCCUS SPECIES (COAGULASE NEGATIVE)  Culture, blood (routine x 2)     Status: Abnormal   Collection Time: 04/02/16  4:35 PM  Result Value Ref Range Status   Specimen Description BLOOD RIGHT ARM  Final   Special Requests   Final    BOTTLES DRAWN AEROBIC AND ANAEROBIC 4CC ANA 5CC AER   Culture  Setup Time   Final    GRAM POSITIVE COCCI IN CLUSTERS IN BOTH AEROBIC AND ANAEROBIC BOTTLES CRITICAL RESULT CALLED TO, READ BACK BY AND VERIFIED WITH: Patsey Berthold. Eckelmann RN 16:45 04/03/16 ([email or web address])    Culture (A)  Final    STAPHYLOCOCCUS SPECIES (COAGULASE NEGATIVE) SUSCEPTIBILITIES PERFORMED ON PREVIOUS CULTURE WITHIN THE LAST 5 DAYS.    Report Status 04/05/2016 FINAL  Final  Culture, blood (Routine X 2) w Reflex to ID Panel     Status: None (Preliminary result)   Collection Time: 04/04/16  3:28 PM  Result Value Ref Range Status   Specimen Description BLOOD RIGHT HAND  Final   Special Requests IN PEDIATRIC BOTTLE 0.5CC  Final   Culture NO GROWTH 4 DAYS  Final   Report Status PENDING  Incomplete  Cath Tip Culture     Status: None   Collection Time: 04/04/16  5:19 PM  Result Value Ref Range Status   Specimen Description CATH TIP RIGHT NECK  Final   Special Requests NONE  Final   Culture   Final    NO GROWTH 2 DAYS Performed at [facility]    Report Status 04/07/2016 FINAL  Final    RADIOLOGY STUDIES/RESULTS: Dg Chest 1 View  03/30/2016  CLINICAL DATA:  Syncopal episode EXAM: CHEST 1 VIEW COMPARISON:  02/28/2016 FINDINGS: Cardiac shadow is stable. Right jugular dialysis catheter is again seen and stable. The lungs are well aerated bilaterally. No focal infiltrate or sizable effusion is seen. No significant edema is seen. No bony abnormality is noted. IMPRESSION: No active disease. Electronically  Signed   By: Eulah PontMark  Lukens M.D.  On: 03/30/2016 16:43   Dg Chest 2 View  04/03/2016  CLINICAL DATA:  Short of breath today.  Pleural effusions. EXAM: CHEST  2 VIEW COMPARISON:  Chest CT, 04/01/2016.  Chest radiograph, 03/30/2016. FINDINGS: Cardiac silhouette is normal in size and configuration. No mediastinal or hilar masses or pathologically enlarged lymph nodes. Right internal jugular dual-lumen tunneled central venous catheter has its tip projecting in the right atrium. This is stable. There are small, right greater than left, pleural effusions. These appear smaller than noted on the recent prior CT. There is no convincing pneumonia or pulmonary edema. No pneumothorax. IMPRESSION: 1. Small, right greater left, pleural effusions which appears smaller than they were on the recent prior chest CT. 2. No evidence of pneumonia or pulmonary edema. Electronically Signed   By: Amie Portland M.D.   On: 04/03/2016 12:44   Dg Thoracic Spine 2 View  03/30/2016  CLINICAL DATA:  Upper and mid back pain after falling today. EXAM: THORACIC SPINE 2 VIEWS COMPARISON:  02/28/2016 chest radiograph FINDINGS: Right-sided dialysis catheter with tip at cavoatrial junction or high right atrium. Lateral view images from approximately the bottom of C6 through the bottom of T11. Moderate spondylosis across these levels, without vertebral body height loss. Lower thoracic vertebral body height maintained on lumbar spine radiographs, dictated separately. IMPRESSION: Spondylosis, without acute finding in the thoracic spine. Electronically Signed   By: Jeronimo Greaves M.D.   On: 03/30/2016 16:44   Dg Lumbar Spine Complete  03/30/2016  CLINICAL DATA:  Patient status post fall. Mid and lower back pain. Initial encounter. EXAM: LUMBAR SPINE - COMPLETE 4+ VIEW COMPARISON:  None. FINDINGS: Mild leftward curvature of the lumbar spine. Preservation of the vertebral body and intervertebral disc space heights. SI joints are unremarkable. No evidence  for acute lumbar spine fracture. Lower lumbar spine degenerative disc and facet disease. IMPRESSION: Degenerative disc disease.  No acute osseous abnormality. Electronically Signed   By: Annia Belt M.D.   On: 03/30/2016 16:43   Ct Head Wo Contrast  03/30/2016  CLINICAL DATA:  Initial encounter for MULTIPLE RECENT FALLS, TODAY PT. FELL BUT DENIES LOC, GENERALIZED WEAKNESS, HX ESRD-DM-SYNCOPE-HTN-CAD, LW EXAM: CT HEAD WITHOUT CONTRAST CT CERVICAL SPINE WITHOUT CONTRAST TECHNIQUE: Multidetector CT imaging of the head and cervical spine was performed following the standard protocol without intravenous contrast. Multiplanar CT image reconstructions of the cervical spine were also generated. COMPARISON:  Head CT of 12/26/2015. Prior head and cervical spine CTs 12/25/2015. FINDINGS: CT HEAD FINDINGS Sinuses/Soft tissues: No significant soft tissue swelling. Mild ethmoid air cell mucosal thickening. No skull fracture. Clear mastoid air cells. Intracranial: Marked improvement to resolution of previously described subdural collection adjacent the left frontal lobe. Minimal prominence of extra-axial spaces remains, including image 17/series 201. Hypo attenuating. Bilateral vertebral artery atherosclerosis. Moderate low density in the periventricular white matter likely related to small vessel disease. Resolved right-sided extra-axial/subdural fluid collection. 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 No acute large vessel cortically based infarct. No new hemorrhage, intra-axial, or extra-axial fluid collection. No mass lesion. CT CERVICAL SPINE FINDINGS Spinal visualization through the bottom of T2. Prevertebral soft tissues are within normal limits. Left worse than right carotid atherosclerosis. A right-sided central line is incompletely imaged. Small right pleural effusion is similar to 12/25/2015. No apical pneumothorax. Base interlobular septal thickening which suggests interstitial edema. Skull base intact.   Skull base intact. Trace C2-3 anterolisthesis is similar and likely degenerative. Maintenance of vertebral body height. Straightening of expected lordosis. Facets  are well-aligned. Multilevel spondylosis, with loss of intervertebral disc height and endplate osteophyte formation. C1-2 articulation demonstrates only degenerative change on coronal reformats. IMPRESSION: 1. No acute intracranial abnormality. Cerebral atrophy is small vessel ischemic change. 2. Resolved right and markedly improved to resolved left-sided subdural fluid collections. There is minimal residual prominence of the left extra-axial space adjacent the frontal lobe. 3. Cervical spondylosis, without acute fracture or subluxation. Straightening of expected cervical lordosis could be positional, due to muscular spasm, or ligamentous injury. 4. Similar small right pleural effusion. Suspect interstitial edema/congestive heart failure. Electronically Signed   By: Jeronimo Greaves M.D.   On: 03/30/2016 17:36   Ct Chest Wo Contrast  04/01/2016  CLINICAL DATA:  Shortness of breath today in patient admitted to the hospital with a chief complaint of syncope on 03/30/2016. The patient denies cough. End-stage renal disease. Initial encounter. EXAM: CT CHEST WITHOUT CONTRAST TECHNIQUE: Multidetector CT imaging of the chest was performed following the standard protocol without IV contrast. COMPARISON:  PA and lateral chest 02/28/2016. FINDINGS: The patient has moderate bilateral pleural effusions, greater on the right. Mild cardiomegaly is seen. Small pericardial effusion is identified. Extensive calcific coronary artery disease is present. Dialysis catheter from a right IJ approach is noted. There is no axillary, hilar or mediastinal lymphadenopathy. The lungs demonstrate mild compressive atelectasis secondary to the patient's pleural effusions, slightly greater on the right. There is also some interlobular septal thickening in the upper lobes which is worse on  the left. The lungs are otherwise unremarkable. Visualized upper abdomen demonstrates extensive atherosclerosis. No focal abnormality is seen. No focal bony abnormality is identified. IMPRESSION: Moderate bilateral pleural effusions, larger on the right. Interlobular septal thickening in the upper lobes bilaterally is compatible with mild edema. Small pericardial effusion. Extensive calcific coronary artery disease. Electronically Signed   By: Drusilla Kanner M.D.   On: 04/01/2016 13:15   Ct Cervical Spine Wo Contrast  03/30/2016  CLINICAL DATA:  Initial encounter for MULTIPLE RECENT FALLS, TODAY PT. FELL BUT DENIES LOC, GENERALIZED WEAKNESS, HX ESRD-DM-SYNCOPE-HTN-CAD, LW EXAM: CT HEAD WITHOUT CONTRAST CT CERVICAL SPINE WITHOUT CONTRAST TECHNIQUE: Multidetector CT imaging of the head and cervical spine was performed following the standard protocol without intravenous contrast. Multiplanar CT image reconstructions of the cervical spine were also generated. COMPARISON:  Head CT of 12/26/2015. Prior head and cervical spine CTs 12/25/2015. FINDINGS: CT HEAD FINDINGS Sinuses/Soft tissues: No significant soft tissue swelling. Mild ethmoid air cell mucosal thickening. No skull fracture. Clear mastoid air cells. Intracranial: Marked improvement to resolution of previously described subdural collection adjacent the left frontal lobe. Minimal prominence of extra-axial spaces remains, including image 17/series 201. Hypo attenuating. Bilateral vertebral artery atherosclerosis. Moderate low density in the periventricular white matter likely related to small vessel disease. Resolved right-sided extra-axial/subdural fluid collection. 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 No acute large vessel cortically based infarct. No new hemorrhage, intra-axial, or extra-axial fluid collection. No mass lesion. CT CERVICAL SPINE FINDINGS Spinal visualization through the bottom of T2. Prevertebral soft tissues are within normal  limits. Left worse than right carotid atherosclerosis. A right-sided central line is incompletely imaged. Small right pleural effusion is similar to 12/25/2015. No apical pneumothorax. Base interlobular septal thickening which suggests interstitial edema. Skull base intact.  Skull base intact. Trace C2-3 anterolisthesis is similar and likely degenerative. Maintenance of vertebral body height. Straightening of expected lordosis. Facets are well-aligned. Multilevel spondylosis, with loss of intervertebral disc height and endplate osteophyte formation. C1-2 articulation demonstrates only  degenerative change on coronal reformats. IMPRESSION: 1. No acute intracranial abnormality. Cerebral atrophy is small vessel ischemic change. 2. Resolved right and markedly improved to resolved left-sided subdural fluid collections. There is minimal residual prominence of the left extra-axial space adjacent the frontal lobe. 3. Cervical spondylosis, without acute fracture or subluxation. Straightening of expected cervical lordosis could be positional, due to muscular spasm, or ligamentous injury. 4. Similar small right pleural effusion. Suspect interstitial edema/congestive heart failure. Electronically Signed   By: Jeronimo Greaves M.D.   On: 03/30/2016 17:36   Mr Lumbar Spine Wo Contrast  04/05/2016  CLINICAL DATA:  75 year old male with end-stage renal disease, back pain since December. Falls. Bacteremia. Initial encounter. EXAM: MRI LUMBAR SPINE WITHOUT CONTRAST TECHNIQUE: Multiplanar, multisequence MR imaging of the lumbar spine was performed. No intravenous contrast was administered. COMPARISON:  Lumbar radiographs 03/30/2016. FINDINGS: Normal lumbar segmentation demonstrated on the comparison. The patient was unable to lie flat. Subsequently The examination had to be discontinued prior to completion. Only sagittal imaging could be obtained, and is motion degraded. Diffusely decreased T1 and T2 bone marrow signal. Abnormal STIR  hyperintensity in the L4 inferior endplate and posterior vertebral body (series 4, image 3 and series 3, image 10. There is also mild associated abnormal increased STIR signal in the adjacent L5 superior endplate, but this appears somewhat focal like a Schmorl node. The posterior vertebral body signal abnormality appears somewhat contiguous with the abnormal intervertebral disc region signal. This could represent a leftward disc extrusion extending into the left L4 neural foramen. No evidence of acute osseous abnormality at the other visualized levels. No significant lumbar spinal stenosis. IMPRESSION: 1. The examination had to be discontinued prior to completion due to the patient's inability to lie flat. 2. Abnormal signal in the L4 disc and adjacent L4-L5 endplates. However, favor this is due to a disc herniation extending into the left L4 neural foramen. Is there lateralizing left hip or lower extremity pain, such as due to left L4 radiculitis? 3. L4-L5 discitis osteomyelitis felt less likely. Repeat lumbar MRI to include axial imaging may be confirmatory when possible. 4. 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 Electronically Signed   By: Odessa Fleming M.D.   On: 04/05/2016 17:46   Dg Chest Port 1 View  04/06/2016  CLINICAL DATA:  Central line placement, status post dialysis catheter insertion EXAM: PORTABLE CHEST 1 VIEW COMPARISON:  Chest radiograph dated 04/05/2016 FINDINGS: Lungs are clear.  No pleural effusion or pneumothorax. The heart is top-normal in size. Left IJ dual lumen dialysis catheter with its distal tip in the upper right atrium. IMPRESSION: Left-sided dual lumen dialysis catheter with its distal tip in the upper right atrium. No pneumothorax. Electronically Signed   By: Charline Bills M.D.   On: 04/06/2016 10:11   Dg Chest Port 1 View  04/05/2016  CLINICAL DATA:  Shortness of Breath EXAM: PORTABLE CHEST 1 VIEW COMPARISON:  04/03/2016 FINDINGS: Cardiac  shadow is stable. The previously seen dialysis catheter has been removed in the interval. The lungs are well aerated bilaterally. Mild increased density is noted over both lungs which may be related to small effusions. No focal infiltrate is seen. No bony abnormality is noted. IMPRESSION: Question small effusions. Clinical correlation is recommended. Upright films when the patient is able are also recommended. Electronically Signed   By: Alcide Clever M.D.   On: 04/05/2016 18:26   Dg Fluoro Guide Cv Line-no Report  04/06/2016  CLINICAL DATA:  Harden Mo  GUIDE CV LINE Fluoroscopy was utilized by the requesting physician.  No radiographic interpretation.     LOS: 7 days   Jeoffrey Massed, MD  [facility] Pager:[phone]  If 7PM-7AM, please contact night-coverage [email or web address] Password [email or web address] 04/09/2016, 11:15 AM

## 2016-04-09 NOTE — Progress Notes (Signed)
Hypoglycemic Event  CBG: Results for Jeffrey Walls, Jeffrey Walls (MRN [id number]) as of 04/09/2016 22:26  Ref. Range 04/09/2016 21:07  Glucose-Capillary Latest Ref Range: 65-99 mg/dL 69    Treatment: 15 GM carbohydrate snack  Symptoms: None  Follow-up CBG: Time: CBG Result:Results for Jeffrey Walls, Jeffrey Walls (MRN [id number]) as of 04/09/2016 22:26  Ref. Range 04/09/2016 21:34  Glucose-Capillary Latest Ref Range: 65-99 mg/dL 85    Possible Reasons for Event: Inadequate meal intake  Comments/MD notified:    Carmin Muskratastro, Dulcey Riederer Arzadon

## 2016-04-09 NOTE — Clinical Social Work Note (Signed)
Per attending MD, patient not yet ready for discharge as he still cannot sit for dialysis. [facility] and [facility]. CSW will continue to follow, provide support and other other CSW services as needed to patient/family until patient medically stable for discharge.   Genelle BalVanessa Takeo Harts, MSW, LCSW Licensed Clinical Social Worker Clinical Social Work Department [facility] [phone]

## 2016-04-09 NOTE — Procedures (Signed)
I have personally attended this patient's dialysis session.   TDC 400 UF goal 1 liter No edema at all Maturing L AVF (won't be ready for use until June) 4K bath (K 3.9)  Camille Balynthia Ilhan Debenedetto, MD [facility] [phone] Pager 04/09/2016, 8:53 AM

## 2016-04-09 NOTE — Progress Notes (Signed)
[facility] Progress Note    Subjective:    Seen in hemo  Had TEE yesterday and does in fact have a 3 by 2 cm mass attached to the right atrial free wall possibly a vegetation related to previous HD catheter and current new cathter tip is in close proximity - myxoma cannont be excluded and repeat ECHO is recommended.  Dr. Ninetta LightsHatcher is recommending 6 weeks of vancomycin, and his note indicates L4-5 discitis (I thought MRI was inconclusive for that...)  Objective Filed Vitals:   04/09/16 0732 04/09/16 0738 04/09/16 0743 04/09/16 0800  BP: 145/76 165/82 174/82 149/78  Pulse: 68 63 64 69  Temp: 98 F (36.7 C)     TempSrc: Oral     Resp:      Height:      Weight: 52.7 kg (116 lb 2.9 oz)     SpO2: 97%      Physical Exam Seen in hemo General: Anxious but cooperative, NAD  Heart: S1, S2, No M/R/G No JVD Lungs: bilateral breath sounds CTA Abdomen: hyperactive BS, nontender, nondistended.  Extremities: No LE edema Dialysis Access: LIJ perm cath Drsg CDI. Maturing LFA AVF + bruit    Additional Objective Labs: Basic Metabolic Panel:  Recent Labs Lab 04/04/16 0703 04/06/16 0530 04/09/16 0754  NA 133* 135 134*  K 4.4 3.9 3.9  CL 96* 99* 100*  CO2 28 26 28   GLUCOSE 159* 97 97  BUN 19 14 23*  CREATININE 3.12* 2.91*  2.92* 3.17*  CALCIUM 7.7* 7.9* 7.9*  PHOS 2.6 1.9* 3.4     Recent Labs Lab 04/04/16 0703 04/06/16 0530 04/09/16 0754  ALBUMIN 2.0* 2.1* 2.2*     Recent Labs Lab 04/04/16 0555 04/04/16 1217 04/06/16 0530 04/07/16 0521 04/09/16 0755  WBC 4.2 3.9* 5.6 4.9 5.4  HGB 9.5* 9.3* 9.2* 8.5* 8.7*  HCT 29.0* 29.1* 28.6* 27.2* 27.6*  MCV 89.8 90.1 89.9 89.8 89.6  PLT 112* 92* 135* 123* 137*       Component Value Date/Time   SDES CATH TIP RIGHT NECK 04/04/2016 1719   SPECREQUEST NONE 04/04/2016 1719   CULT  04/04/2016 1719    NO GROWTH 2 DAYS Performed at [facility]    REPTSTATUS 04/07/2016 FINAL 04/04/2016 1719       Recent Labs Lab 04/07/16 2140 04/08/16 0838 04/08/16 1155 04/08/16 1605 04/08/16 2121  GLUCAP 113* 82 81 110* 132*   Medications:   . albumin human  25 g Intravenous Once  . aspirin EC  81 mg Oral Daily  . citalopram  20 mg Oral Daily  . darbepoetin (ARANESP) injection - DIALYSIS  100 mcg Intravenous Q Mon-HD  . enoxaparin (LOVENOX) injection  30 mg Subcutaneous Q24H  . feeding supplement  1 Container Oral BID BM  . feeding supplement (PRO-STAT SUGAR FREE 64)  30 mL Oral BID  . folic acid  1 mg Oral Daily  . insulin aspart  0-9 Units Subcutaneous TID WC  . lisinopril  10 mg Oral Daily  . metoprolol succinate  25 mg Oral Daily  . oxyCODONE      . pantoprazole  20 mg Oral Daily  . sodium chloride flush  3 mL Intravenous Q12H  . vancomycin  500 mg Intravenous Q M,W,F-HD   Dialysis Orders:  MonWedFri, 4 hrs 0 min, 180NRe Optiflux, BFR 400, DFR Manual 800 mL/min, EDW 56 (kg), Dialysate 2.0 K, 2.25 Ca, UFR Profile: None, Sodium Model: None, Access: LIJ Perm Catheter-Tunneled Mircera: 30 mcg  IV q 2 weeks  Assessment: 1. MRSE cath sepsis - cath removed 4/21, repeat BC's neg so far, new cath placed on 4/23. MRI inconclusive for osteomyelitis or discitis but Dr. Moshe Cipro note indicates does have. TDC  replaced . On IV vancomycin for total 6 week course. 2. 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 Possible vegetation related to previous catheter, myxoma cannot be excluded, and will need repeat TEE post course of vancomycin 3. Syncopal episode. Mildly reduced EF (40%). .  4. Systolic heart failure/pulmonary edema. Wt now under OP EDW. .  5. Bilateral pleural effusions - largely resolved on 4/23 CXR. Still just very mild blunting of CPA's. 6. ESRD - Continue MWF (). [facility] 4/23 (L RC AVF done 03/04/16 so won't be ready until mid June) 7. Anemia - HGB 8.7. ESA dose 04/24. Darbe 60 QMonday with declining Hb. Increased to 100. (prev on  Mircera outpt last dosed 4/12 at 30 mcg) 8. HTN/Volume - BB and lisinopril. Marland Kitchen Below OP EDW. Adjust on DC.  9. Nutrition: currently NPO. Renal diet with prostat/renal vits when able to eat 10. BMD: Phos 3.4.  No sensipar/no VDRA.  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 Off all immunosuppression at this time.  12. DM per primary 13. Back pain -  + disc herniation and per ID +osteo/discitis (I thought MRI was inconclusive...) 14. Deconditioning 15. Hx falls/ SDH January. No heparin with HD outpt. 16. Dispo - to SNF when ready He will need to be able to sit in a chair for dialysis before he can be discharged - he currently isn't so sure given the severity of his back pain  Camille Bal, MD [facility] [phone] Pager 04/09/2016, 8:43 AM

## 2016-04-10 LAB — GLUCOSE, CAPILLARY
GLUCOSE-CAPILLARY: 99 mg/dL (ref 65–99)
GLUCOSE-CAPILLARY: 147 mg/dL — AB (ref 65–99)
Glucose-Capillary: 79 mg/dL (ref 65–99)
Glucose-Capillary: 157 mg/dL — ABNORMAL HIGH (ref 65–99)

## 2016-04-10 NOTE — Progress Notes (Signed)
Physical Therapy Treatment Patient Details Name: Jeffrey Walls MRN: [id number] DOB: 1941-12-08 Today's Date: 04/10/2016    History of Present Illness 75 year old male with past mental history of diabetes, hypertension and end-stage renal disease who was recently discharged from skilled nursing facility for rehabilitation and had been having increased weakness as well as came in following a syncopal episode. This has been the second one since patient finished inpatient rehabilitation. Hospital course complicated by development of acute systolic heart failure and fever on 4/19 with blood cultures positive for coag-negative staph-felt to be secondary to dialysis catheter related blood stream infection.Also found to have a right atrial mass on transthoracic echocardiogram on 4/19; plans for TEE 4/25    PT Comments    Patient making slow progress with PT.  Continues to be limited by pain.  Follow Up Recommendations  SNF     Equipment Recommendations  3in1 (PT)    Recommendations for Other Services       Precautions / Restrictions Precautions Precautions: Fall Precaution Comments: orthostatic hypotension Restrictions Weight Bearing Restrictions: No    Mobility  Bed Mobility Overal bed mobility: Needs Assistance Bed Mobility: Supine to Sit;Sit to Supine     Supine to sit: Supervision Sit to supine: Supervision   General bed mobility comments: Verbal cues for technique.  Supervision for safety.  Transfers Overall transfer level: Needs assistance Equipment used: Rolling walker (2 wheeled) Transfers: Sit to/from Stand Sit to Stand: Supervision         General transfer comment: Cues to self-monitor for activity tolerance  Ambulation/Gait Ambulation/Gait assistance: Min guard Ambulation Distance (Feet): 64 Feet Assistive device: Rolling walker (2 wheeled) Gait Pattern/deviations: Step-through pattern;Decreased stride length;Trunk flexed   Gait velocity interpretation:  Below normal speed for age/gender General Gait Details: Min guard for safety.  No c/o lightheadedness today.   Stairs            Wheelchair Mobility    Modified Rankin (Stroke Patients Only)       Balance                                    Cognition Arousal/Alertness: Awake/alert Behavior During Therapy: WFL for tasks assessed/performed;Flat affect Overall Cognitive Status: Within Functional Limits for tasks assessed                      Exercises      General Comments        Pertinent Vitals/Pain Pain Assessment: 0-10 Pain Score: 10-Worst pain ever Pain Location: back Pain Descriptors / Indicators: Aching Pain Intervention(s): Monitored during session;Repositioned;Patient requesting pain meds-RN notified    Home Living                      Prior Function            PT Goals (current goals can now be found in the care plan section) Progress towards PT goals: Progressing toward goals    Frequency  Min 3X/week    PT Plan Current plan remains appropriate    Co-evaluation             End of Session Equipment Utilized During Treatment: Gait belt Activity Tolerance: Patient limited by fatigue;Patient limited by pain Patient left: in bed;with call bell/phone within reach;with bed alarm set;with nursing/sitter in room     Time: 1150-1204 PT Time Calculation (min) (ACUTE ONLY): 14 min  Charges:  $  Gait Training: 8-22 mins                    G Codes:      Vena Austria 04-29-2016, 12:37 PM Durenda Hurt. Earlene Plater PT, [facility] Acute Rehab Services Pager [phone]

## 2016-04-10 NOTE — Progress Notes (Signed)
[facility] Progress Note    Subjective: "When am I going to get something to eat?" Patient lying flat in bed, denies chest pain/SOB.   Had TEE 04/08/16 and does in fact have a 3 by 2 cm mass attached to the right atrial free wall possibly a vegetation related to previous HD catheter and current new catheter tip is in close proximity - myxoma cannont be excluded and repeat ECHO is recommended. ID recommending vancomycin for 6 weeks for Coag Neg Staph/MRSE Bacteremia.   Objective Filed Vitals:   04/09/16 1733 04/09/16 2111 04/10/16 0517 04/10/16 0800  BP: 160/64 131/79 175/61 171/67  Pulse: 108 64 66 67  Temp: 98.2 F (36.8 C) 98.4 F (36.9 C) 97.7 F (36.5 C) 98.3 F (36.8 C)  TempSrc: Oral Oral Oral Oral  Resp: Height:      Weight:      SpO2: 100% 100% 100% 100%   Physical Exam General: Alert, cooperative at present. NAD Heart: S1, S2, No M/R/G No JVD Lungs: bilateral breath sounds CTA A/P Abdomen: active BS, nontender, nondistended.  Extremities: No LE edema Dialysis Access: LIJ perm cath Drsg CDI. Maturing LFA AVF + bruit   Additional Objective   Recent Labs Lab 04/04/16 0703 04/06/16 0530 04/09/16 0754  NA 133* 135 134*  K 4.4 3.9 3.9  CL 96* 99* 100*  CO2 GLUCOSE 159* 97 97  BUN 19 14 23*  CREATININE 3.12* 2.91*  2.92* 3.17*  CALCIUM 7.7* 7.9* 7.9*  PHOS 2.6 1.9* 3.4     Recent Labs Lab 04/04/16 0703 04/06/16 0530 04/09/16 0754  ALBUMIN 2.0* 2.1* 2.2*     Recent Labs Lab 04/04/16 0555 04/04/16 1217 04/06/16 0530 04/07/16 0521 04/09/16 0755  WBC 4.2 3.9* 5.6 4.9 5.4  HGB 9.5* 9.3* 9.2* 8.5* 8.7*  HCT 29.0* 29.1* 28.6* 27.2* 27.6*  MCV 89.8 90.1 89.9 89.8 89.6  PLT 112* 92* 135* 123* 137*   Blood Culture    Component Value Date/Time   SDES CATH TIP RIGHT NECK 04/04/2016 1719   SPECREQUEST NONE 04/04/2016 1719   CULT  04/04/2016 1719    NO GROWTH 2 DAYS Performed at [facility]     REPTSTATUS 04/07/2016 FINAL 04/04/2016 1719      Recent Labs Lab 04/09/16 1152 04/09/16 1731 04/09/16 2107 04/09/16 2134 04/10/16 0759  GLUCAP 86 122* 69 85 79    Medications:   . acetaminophen  1,000 mg Oral TID  . albumin human  25 g Intravenous Once  . aspirin EC  81 mg Oral Daily  . citalopram  20 mg Oral Daily  . darbepoetin (ARANESP) injection - DIALYSIS  100 mcg Intravenous Q Mon-HD  . enoxaparin (LOVENOX) injection  30 mg Subcutaneous Q24H  . feeding supplement  1 Container Oral BID BM  . feeding supplement (PRO-STAT SUGAR FREE 64)  30 mL Oral BID  . folic acid  1 mg Oral Daily  . insulin aspart  0-9 Units Subcutaneous TID WC  . lisinopril  10 mg Oral Daily  . metoprolol succinate  25 mg Oral Daily  . pantoprazole  20 mg Oral Daily  . sodium chloride flush  3 mL Intravenous Q12H  . vancomycin  500 mg Intravenous Q M,W,F-HD   Dialysis Orders: [place] MonWedFri, 4 hrs 0 min 180NRe Optiflux, BFR 400, DFR 800  EDW 56 (kg), 2.0 K, 2.25 Ca,   LIJ Perm Catheter-Tunneled Maturing LFA AVF No heparin Mircera: 30  mcg IV q 2 weeks  Assessment: 1. MRSE cath sepsis - cath removed 4/21, repeat BC's neg so far, new cath placed on 4/23. 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 TDC replaced . On IV vancomycin for total 6 week course. 2. 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 Possible vegetation related to previous catheter, myxoma cannot be excluded, and will need repeat TEE post course of vancomycin 3. Syncopal episode. Mildly reduced EF (40%). No further episodes of syncope reported.  4. Systolic heart failure/pulmonary edema. Wt now under OP EDW. .  5. Bilateral pleural effusions - largely resolved on 4/23 CXR. Still just very mild blunting of CPA's. 6. ESRD - Continue MWF (). [facility] 4/[facility] (L RC AVF done 03/04/16 so won't be ready until mid June) For HD on schedule tomorrow. Will do 1st shift.  Needs to have HD in recliner if possible.  7. Anemia - HGB 8.7. ESA dose 04/24. Darbe 60 QMonday with declining Hb. Increased to 100. (prev on Mircera outpt last dosed 4/12 at 30 mcg) recheck in HD tomorrow.  8. HTN/Volume - BB and lisinopril. Marland Kitchen. Below OP EDW. Adjust on DC. Last HD 04/09/16 pre wt 52.7 post 51.4 Net UF not documents. Attempt 1-1.5 liters tomorrow.  9. Nutrition: Renal/Carb mod diet with prostat/renal vits 10. BMD: Ca 7.9 C Ca 9.34 Phos 3.4. No binders/sensipar/no VDRA.  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 Off all immunosuppression at this time.  12. DM per primary. BS 69-122.  13. Back pain - + disc herniation and per ID +osteo/discitis (I thought MRI was inconclusive...) 14. Deconditioning 15. Hx falls/ SDH January. No heparin with HD outpt. 16. Dispo - to SNF when ready He will need to be able to sit in a chair for dialysis before he can be discharged - he currently isn't so sure given the severity of his back pain   Rita H. Brown NP-C 04/10/2016, 9:34 AM  [facility] [phone]  I have seen and examined this patient and agree with plan as outlined in the above note. As of the time of this assessment, he has still not been OOB and up in the chair. He will need to be able to complete a full dialysis treatment in a recliner with an hour on either end (as outpt to take into account transportation to/from HD) before he can be accepted back in the outpatient dialysis unit. He will have HD tomorrow and will need to be done in the recliner.  Zaydyn Havey B,MD 04/10/2016 1:46 PM

## 2016-04-10 NOTE — Progress Notes (Signed)
Patient able to tolerate up to chair for 2 hours, with lunch.  Jeffrey MarisAndrew Miabella Walls, MBA, BSN, RN

## 2016-04-10 NOTE — Progress Notes (Signed)
Pharmacy Antibiotic Note  Jeffrey Walls is a 75 y.o. male admitted on 03/30/2016 with syncope.  Abx have been narrowed to Vancomycin only for CoNS bacteremia.  Today is day #9 of therapy.  HD access removed 4/21.  TEE with vegetation vs. Myxoma.  ID recommending 6 weeks of vancomycin for CNS/MRSE bacteremia.  Plan: Continue vancomycin 500 mg IV qHD on MWF Vancomycin level pre-HD 4/28.   Height: 5\' 9"  (175.3 cm) Weight: 113 lb 5.1 oz (51.4 kg) IBW/kg (Calculated) : 70.7  Temp (24hrs), Avg:98.1 F (36.7 C), Min:97.7 F (36.5 C), Max:98.4 F (36.9 C)   Recent Labs Lab 04/04/16 0555 04/04/16 0703 04/04/16 1217 04/06/16 0530 04/07/16 0521 04/09/16 0754 04/09/16 0755 04/09/16 1153  WBC 4.2  --  3.9* 5.6 4.9  --  5.4  --   CREATININE  --  3.12*  --  2.91*  2.92*  --  3.17*  --   --   VANCORANDOM  --   --   --   --   --   --   --  15    Estimated Creatinine Clearance: 15.1 mL/min (by C-G formula based on Cr of 3.17).    Allergies  Allergen Reactions  . Ampicillin Rash    Ankle swelling  . Benadryl [Diphenhydramine Hcl] Itching    Rash    Antimicrobials this admission: Vanc 4/19 >>  Elita QuickFortaz 4/19 >> 4/19  Dose adjustments this admission: n/a  Microbiology results: 4/21 cath tip: ngF 4/21 BCx: neg 4/19 BCx: 2/2 CoNS 4/19 UCx: multiple species  4/16 MRSA screen neg  Thank you for allowing pharmacy to be a part of this patient's care.  Tad MooreJessica Brigett Estell, Pharm D, BCPS  Clinical Pharmacist Pager [phone]  04/10/2016 10:08 AM   04/10/2016 10:08 AM

## 2016-04-10 NOTE — Progress Notes (Signed)
PROGRESS NOTE        PATIENT DETAILS Name: Jeffrey Walls Age: 75 y.o. Sex: male Date of Birth: 11/13/1941 Admit Date: 03/30/2016 Admitting Physician Haydee Salter, MD ZOX:[id number], MD  Brief Narrative: Patient is a 75 y.o. 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 Hospital course complicated by development of acute systolic heart failure and fever on 4/19 with blood cultures positive for coag-negative staph-felt to be secondary to dialysis catheter related blood stream infection.Also found to have a right atrial mass on transthoracic echocardiogram on 4/19. Hemodialysis catheter has been removed by vascular surgery.TEE on 4/24 confirmed atrial mass and revealed EF of 35-40%. Discharge was held because patient was unable to tolerate HD in a seated position (necessary for outpatient dialysis) secondary to back pain. Back pain has improved, will attempt seated position in dialysis tomorrow. See below for further details.   Subjective: Patient doing well, back pain is much better and he is able to sit up in bed without complaints. Discussed plan of discharge for tomorrow if able to sit in recliner during HD tomorrow.   Assessment/Plan: Principal Problem: Bacteremia, coagulase-negative staphylococcal: Probably secondary to hemodialysis catheter-related bloodstream infection. Hemodialysis catheter has been removed on 4/21, repeat blood cultures on 4/21 are negative, cath tip cultures are also negative so far. Infectious disease following, recommendations are to continue intravenous vancomycin for 6 weeks. MRI of the lumbosacral spine-although incomplete study-does not show discitis/osteomyelitis. TEE confirms a right atrial mass.Plans are to repeat Echo once completes 6 weeks of IV Vancomycin.  Active Problems: Syncope: Etiology unclear, but has a  large right atrial mass seen on echo-this could potentially be the cause of syncope. Echo shows mildly decreased EF around 40%. Lower Ext Doppler negative.Telemetry negative. Spoke with cardiologist-Dr. Johney Frame on 4/22-who reviewed chart, did not advise anticoagulate patient in this setting.   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 TEE confirmed atrial mass -which is likely related to infected HD catheter or may be a vegetation-recommendations from cardiology are to repeat TEE after he completes antibiotic course   Acute systolic heart failure: compensated.EF by TEE on 4/25 35-40%, diffuse hypokinesis. Volume removal hemodialysis. Nephrology slowly decreasing dry weight.   Bilateral pleural effusion: Suspect related to CHF. Volume removal with hemodialysis. Doubt needs thoracocentesis at this point-as chest x-ray on 4/23 demonstrates no pleural effusion.  Elevated troponin: Likely false-positive elevation in the setting of ESRD-trend is flat not consistent with ACS. However does have known history of CAD. Will plan on medical treatment   Pulmonary hypertension: PA pressure of 60 mmHg via transthoracic echocardiogram, reviewed outpatient echocardiogram (September 2016) in care everywhere-  showed mild pulmonary hypertension. Suspect this is related to underlying Anca vasculitis.   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 Nephrology following   Hypertension:  BP controlled, continue lisinopril and metoprolol-may need to further adjust dosing depending on post dialysis dry weight.  Type 2 diabetes: CBGs stable-continue SSI.   Back pain:  Given bacteremia- MRI lumbosacral spine done on 4/22-although incomplete study-no discitis/osteomyelitis-but does show disc  herniation at L4-L5. Continue supportive care. Cannot be discharged  due to back pain preventing him from being in seated position that is necessary for outpatient dialysis.   Anemia of renal disease: Management as per nephrology  Thrombocytopenia:Could be related to bacteremia, improving.   Frequent falls: non focal exam-check orthostatics-PT eval  Protein-calorie malnutrition, severe: Continue supplements.   DVT Prophylaxis: Prophylactic Lovenox   Code Status: Full code   Family Communication: None at bedside  Disposition Plan: Remain inpatient- SNF on discharge when able to sit up for HD-hopefully 4/28  Antimicrobial agents: IV Vanco 4/19>> IV Elita Quick 4/19>>4/21  Procedures: Echo 4/19>>Moderate aortic regurg, ejection fraction 40-45 percent, grade 2 diastolic dysfunction-with right atrial mass 4/21>> hemodialysis catheter removed 4/23>>Ultrasound-guided insertion of Diatek catheter, left internal jugular vein 4/25>>TEE-Right atrial mass  CONSULTS:  cardiology  Time spent: 25 minutes-Greater than 50% of this time was spent in counseling, explanation of diagnosis, planning of further management, and coordination of care.  MEDICATIONS: Anti-infectives    Start     Dose/Rate Route Frequency Ordered Stop   04/09/16 0838  vancomycin (VANCOCIN) 500-5 MG/100ML-% IVPB    Comments:  Roosvelt Harps   : cabinet override      04/09/16 0838 04/09/16 1032   04/07/16 1003  vancomycin (VANCOCIN) 500 MG/100ML IVPB    Comments:  Woods, [name]   : cabinet override      04/07/16 1003 04/07/16 1034   04/06/16 0715  vancomycin (VANCOCIN) IVPB 1000 mg/200 mL premix  Status:  Discontinued     1,000 mg 200 mL/hr over 60 Minutes Intravenous To [facility] 04/06/16 0616 04/06/16 0621   04/04/16 1200  vancomycin (VANCOCIN) 500 mg in sodium chloride 0.9 % 100 mL IVPB     500 mg 100 mL/hr over 60 Minutes Intravenous Every M-W-F (Hemodialysis) 04/02/16 1630     04/02/16 1800   cefTAZidime (FORTAZ) 2 g in dextrose 5 % 50 mL IVPB  Status:  Discontinued     2 g 100 mL/hr over 30 Minutes Intravenous Every M-W-F (1800) 04/02/16 1624 04/04/16 1148   04/02/16 1630  vancomycin (VANCOCIN) IVPB 1000 mg/200 mL premix     1,000 mg 200 mL/hr over 60 Minutes Intravenous  Once 04/02/16 1623 04/02/16 1740      Scheduled Meds: . acetaminophen  1,000 mg Oral TID  . albumin human  25 g Intravenous Once  . aspirin EC  81 mg Oral Daily  . citalopram  20 mg Oral Daily  . darbepoetin (ARANESP) injection - DIALYSIS  100 mcg Intravenous Q Mon-HD  . enoxaparin (LOVENOX) injection  30 mg Subcutaneous Q24H  . feeding supplement  1 Container Oral BID BM  . feeding supplement (PRO-STAT SUGAR FREE 64)  30 mL Oral BID  . folic acid  1 mg Oral Daily  . insulin aspart  0-9 Units Subcutaneous TID WC  . lisinopril  10 mg Oral Daily  . metoprolol succinate  25 mg Oral Daily  . pantoprazole  20 mg Oral Daily  . sodium chloride flush  3 mL Intravenous Q12H  . vancomycin  500 mg Intravenous Q M,W,F-HD   Continuous Infusions:  PRN Meds:.ALPRAZolam, morphine injection, ondansetron **OR** ondansetron (ZOFRAN) IV, oxyCODONE, senna-docusate, zolpidem   PHYSICAL EXAM: Vital signs: Filed Vitals:   04/09/16 1733 04/09/16 2111 04/10/16 0517 04/10/16 0800  BP: 160/64 131/79 175/61 171/67  Pulse: 108 64 66 67  Temp: 98.2 F (36.8 C) 98.4 F (36.9 C) 97.7 F (36.5 C) 98.3 F (36.8 C)  TempSrc: Oral Oral Oral Oral  Resp: 16  Height:      Weight:      SpO2: 100% 100% 100% 100%   Filed Weights   04/07/16 2142 04/09/16 0732 04/09/16 1138  Weight: 53 kg (116 lb 13.5 oz) 52.7 kg (116 lb 2.9 oz) 51.4 kg (113 lb 5.1 oz)   Body mass index is 16.73 kg/(m^2).   Gen Exam: Awake and alert with clear speech. Not in any distress  Neck: Supple, No JVD.  Chest: B/L Clear. No rales or rhonchi  CVS: S1 S2 Regular Abdomen: soft, BS +, non tender, non distended.  Extremities: no edema, lower  extremities warm to touch. Neurologic: Non Focal.   Skin: No Rash or lesions   Wounds: N/A.    LABORATORY DATA: CBC:  Recent Labs Lab 04/04/16 0555 04/04/16 1217 04/06/16 0530 04/07/16 0521 04/09/16 0755  WBC 4.2 3.9* 5.6 4.9 5.4  HGB 9.5* 9.3* 9.2* 8.5* 8.7*  HCT 29.0* 29.1* 28.6* 27.2* 27.6*  MCV 89.8 90.1 89.9 89.8 89.6  PLT 112* 92* 135* 123* 137*    Basic Metabolic Panel:  Recent Labs Lab 04/04/16 0703 04/06/16 0530 04/09/16 0754  NA 133* 135 134*  K 4.4 3.9 3.9  CL 96* 99* 100*  CO2 28 26 28   GLUCOSE 159* 97 97  BUN 19 14 23*  CREATININE 3.12* 2.91*  2.92* 3.17*  CALCIUM 7.7* 7.9* 7.9*  PHOS 2.6 1.9* 3.4    GFR: Estimated Creatinine Clearance: 15.1 mL/min (by C-G formula based on Cr of 3.17).  Liver Function Tests:  Recent Labs Lab 04/04/16 0703 04/06/16 0530 04/09/16 0754  ALBUMIN 2.0* 2.1* 2.2*   No results for input(s): LIPASE, AMYLASE in the last 168 hours. No results for input(s): AMMONIA in the last 168 hours.  Coagulation Profile: No results for input(s): INR, PROTIME in the last 168 hours.  Cardiac Enzymes: No results for input(s): CKTOTAL, CKMB, CKMBINDEX, TROPONINI in the last 168 hours.  BNP (last 3 results) No results for input(s): PROBNP in the last 8760 hours.  HbA1C: No results for input(s): HGBA1C in the last 72 hours.  CBG:  Recent Labs Lab 04/09/16 1152 04/09/16 1731 04/09/16 2107 04/09/16 2134 04/10/16 0759  GLUCAP 86 122* 69 85 79    Lipid Profile: No results for input(s): CHOL, HDL, LDLCALC, TRIG, CHOLHDL, LDLDIRECT in the last 72 hours.  Thyroid Function Tests: No results for input(s): TSH, T4TOTAL, FREET4, T3FREE, THYROIDAB in the last 72 hours.  Anemia Panel: No results for input(s): VITAMINB12, FOLATE, FERRITIN, TIBC, IRON, RETICCTPCT in the last 72 hours.  Urine analysis:    Component Value Date/Time   COLORURINE YELLOW 03/31/2016 0858   APPEARANCEUR CLOUDY* 03/31/2016 0858   LABSPEC 1.030  03/31/2016 0858   PHURINE 6.0 03/31/2016 0858   GLUCOSEU 250* 03/31/2016 0858   HGBUR MODERATE* 03/31/2016 0858   BILIRUBINUR MODERATE* 03/31/2016 0858   KETONESUR 15* 03/31/2016 0858   PROTEINUR >300* 03/31/2016 0858   NITRITE NEGATIVE 03/31/2016 0858   LEUKOCYTESUR NEGATIVE 03/31/2016 0858    Sepsis Labs: Lactic Acid, Venous    Component Value Date/Time   LATICACIDVEN 0.79 03/30/2016 1703    MICROBIOLOGY: Recent Results (from the past 240 hour(s))  Culture, blood (routine x 2)     Status: Abnormal   Collection Time: 04/02/16  4:05 PM  Result Value Ref Range Status   Specimen Description BLOOD HEMODIALYSIS LINE  Final   Special Requests BOTTLES DRAWN AEROBIC AND ANAEROBIC 10CC  Final   Culture  Setup Time   Final  GRAM POSITIVE COCCI IN CLUSTERS IN BOTH AEROBIC AND ANAEROBIC BOTTLES CRITICAL RESULT CALLED TO, READ BACK BY AND VERIFIED WITH: Patsey Berthold RN 13:15 04/03/16 ([email or web address])    Culture STAPHYLOCOCCUS SPECIES (COAGULASE NEGATIVE) (A)  Final   Report Status 04/05/2016 FINAL  Final   Organism ID, Bacteria STAPHYLOCOCCUS SPECIES (COAGULASE NEGATIVE)  Final      Susceptibility   Staphylococcus species (coagulase negative) - MIC*    CIPROFLOXACIN >=8 RESISTANT Resistant     ERYTHROMYCIN <=0.25 SENSITIVE Sensitive     GENTAMICIN <=0.5 SENSITIVE Sensitive     OXACILLIN >=4 RESISTANT Resistant     TETRACYCLINE <=1 SENSITIVE Sensitive     VANCOMYCIN 1 SENSITIVE Sensitive     TRIMETH/SULFA 160 RESISTANT Resistant     CLINDAMYCIN <=0.25 SENSITIVE Sensitive     RIFAMPIN <=0.5 SENSITIVE Sensitive     Inducible Clindamycin NEGATIVE Sensitive     * STAPHYLOCOCCUS SPECIES (COAGULASE NEGATIVE)  Culture, blood (routine x 2)     Status: Abnormal   Collection Time: 04/02/16  4:35 PM  Result Value Ref Range Status   Specimen Description BLOOD RIGHT ARM  Final   Special Requests   Final    BOTTLES DRAWN AEROBIC AND ANAEROBIC 4CC ANA 5CC AER   Culture  Setup Time   Final    GRAM  POSITIVE COCCI IN CLUSTERS IN BOTH AEROBIC AND ANAEROBIC BOTTLES CRITICAL RESULT CALLED TO, READ BACK BY AND VERIFIED WITH: Patsey Berthold RN 16:45 04/03/16 ([email or web address])    Culture (A)  Final    STAPHYLOCOCCUS SPECIES (COAGULASE NEGATIVE) SUSCEPTIBILITIES PERFORMED ON PREVIOUS CULTURE WITHIN THE LAST 5 DAYS.    Report Status 04/05/2016 FINAL  Final  Culture, blood (Routine X 2) w Reflex to ID Panel     Status: None   Collection Time: 04/04/16  3:28 PM  Result Value Ref Range Status   Specimen Description BLOOD RIGHT HAND  Final   Special Requests IN PEDIATRIC BOTTLE 0.5CC  Final   Culture NO GROWTH 5 DAYS  Final   Report Status 04/09/2016 FINAL  Final  Cath Tip Culture     Status: None   Collection Time: 04/04/16  5:19 PM  Result Value Ref Range Status   Specimen Description CATH TIP RIGHT NECK  Final   Special Requests NONE  Final   Culture   Final    NO GROWTH 2 DAYS Performed at [facility]    Report Status 04/07/2016 FINAL  Final    RADIOLOGY STUDIES/RESULTS: Dg Chest 1 View  03/30/2016  CLINICAL DATA:  Syncopal episode EXAM: CHEST 1 VIEW COMPARISON:  02/28/2016 FINDINGS: Cardiac shadow is stable. Right jugular dialysis catheter is again seen and stable. The lungs are well aerated bilaterally. No focal infiltrate or sizable effusion is seen. No significant edema is seen. No bony abnormality is noted. IMPRESSION: No active disease. Electronically Signed   By: Alcide Clever M.D.   On: 03/30/2016 16:43   Dg Chest 2 View  04/03/2016  CLINICAL DATA:  Short of breath today.  Pleural effusions. EXAM: CHEST  2 VIEW COMPARISON:  Chest CT, 04/01/2016.  Chest radiograph, 03/30/2016. FINDINGS: Cardiac silhouette is normal in size and configuration. No mediastinal or hilar masses or pathologically enlarged lymph nodes. Right internal jugular dual-lumen tunneled central venous catheter has its tip projecting in the right atrium. This is stable. There are small, right greater than left,  pleural effusions. These appear smaller than noted on the recent prior CT. There is no convincing pneumonia or pulmonary edema.  No pneumothorax. IMPRESSION: 1. Small, right greater left, pleural effusions which appears smaller than they were on the recent prior chest CT. 2. No evidence of pneumonia or pulmonary edema. Electronically Signed   By: Amie Portland M.D.   On: 04/03/2016 12:44   Dg Thoracic Spine 2 View  03/30/2016  CLINICAL DATA:  Upper and mid back pain after falling today. EXAM: THORACIC SPINE 2 VIEWS COMPARISON:  02/28/2016 chest radiograph FINDINGS: Right-sided dialysis catheter with tip at cavoatrial junction or high right atrium. Lateral view images from approximately the bottom of C6 through the bottom of T11. Moderate spondylosis across these levels, without vertebral body height loss. Lower thoracic vertebral body height maintained on lumbar spine radiographs, dictated separately. IMPRESSION: Spondylosis, without acute finding in the thoracic spine. Electronically Signed   By: Jeronimo Greaves M.D.   On: 03/30/2016 16:44   Dg Lumbar Spine Complete  03/30/2016  CLINICAL DATA:  Patient status post fall. Mid and lower back pain. Initial encounter. EXAM: LUMBAR SPINE - COMPLETE 4+ VIEW COMPARISON:  None. FINDINGS: Mild leftward curvature of the lumbar spine. Preservation of the vertebral body and intervertebral disc space heights. SI joints are unremarkable. No evidence for acute lumbar spine fracture. Lower lumbar spine degenerative disc and facet disease. IMPRESSION: Degenerative disc disease.  No acute osseous abnormality. Electronically Signed   By: Annia Belt M.D.   On: 03/30/2016 16:43   Ct Head Wo Contrast  03/30/2016  CLINICAL DATA:  Initial encounter for MULTIPLE RECENT FALLS, TODAY PT. FELL BUT DENIES LOC, GENERALIZED WEAKNESS, HX ESRD-DM-SYNCOPE-HTN-CAD, LW EXAM: CT HEAD WITHOUT CONTRAST CT CERVICAL SPINE WITHOUT CONTRAST TECHNIQUE: Multidetector CT imaging of the head and cervical  spine was performed following the standard protocol without intravenous contrast. Multiplanar CT image reconstructions of the cervical spine were also generated. COMPARISON:  Head CT of 12/26/2015. Prior head and cervical spine CTs 12/25/2015. FINDINGS: CT HEAD FINDINGS Sinuses/Soft tissues: No significant soft tissue swelling. Mild ethmoid air cell mucosal thickening. No skull fracture. Clear mastoid air cells. Intracranial: Marked improvement to resolution of previously described subdural collection adjacent the left frontal lobe. Minimal prominence of extra-axial spaces remains, including image 17/series 201. Hypo attenuating. Bilateral vertebral artery atherosclerosis. Moderate low density in the periventricular white matter likely related to small vessel disease. Resolved right-sided extra-axial/subdural fluid collection. 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 No acute large vessel cortically based infarct. No new hemorrhage, intra-axial, or extra-axial fluid collection. No mass lesion. CT CERVICAL SPINE FINDINGS Spinal visualization through the bottom of T2. Prevertebral soft tissues are within normal limits. Left worse than right carotid atherosclerosis. A right-sided central line is incompletely imaged. Small right pleural effusion is similar to 12/25/2015. No apical pneumothorax. Base interlobular septal thickening which suggests interstitial edema. Skull base intact.  Skull base intact. Trace C2-3 anterolisthesis is similar and likely degenerative. Maintenance of vertebral body height. Straightening of expected lordosis. Facets are well-aligned. Multilevel spondylosis, with loss of intervertebral disc height and endplate osteophyte formation. C1-2 articulation demonstrates only degenerative change on coronal reformats. IMPRESSION: 1. No acute intracranial abnormality. Cerebral atrophy is small vessel ischemic change. 2. Resolved right and markedly improved to resolved left-sided subdural fluid  collections. There is minimal residual prominence of the left extra-axial space adjacent the frontal lobe. 3. Cervical spondylosis, without acute fracture or subluxation. Straightening of expected cervical lordosis could be positional, due to muscular spasm, or ligamentous injury. 4. Similar small right pleural effusion. Suspect interstitial edema/congestive heart failure. Electronically Signed   By:  Jeronimo Greaves M.D.   On: 03/30/2016 17:36   Ct Chest Wo Contrast  04/01/2016  CLINICAL DATA:  Shortness of breath today in patient admitted to the hospital with a chief complaint of syncope on 03/30/2016. The patient denies cough. End-stage renal disease. Initial encounter. EXAM: CT CHEST WITHOUT CONTRAST TECHNIQUE: Multidetector CT imaging of the chest was performed following the standard protocol without IV contrast. COMPARISON:  PA and lateral chest 02/28/2016. FINDINGS: The patient has moderate bilateral pleural effusions, greater on the right. Mild cardiomegaly is seen. Small pericardial effusion is identified. Extensive calcific coronary artery disease is present. Dialysis catheter from a right IJ approach is noted. There is no axillary, hilar or mediastinal lymphadenopathy. The lungs demonstrate mild compressive atelectasis secondary to the patient's pleural effusions, slightly greater on the right. There is also some interlobular septal thickening in the upper lobes which is worse on the left. The lungs are otherwise unremarkable. Visualized upper abdomen demonstrates extensive atherosclerosis. No focal abnormality is seen. No focal bony abnormality is identified. IMPRESSION: Moderate bilateral pleural effusions, larger on the right. Interlobular septal thickening in the upper lobes bilaterally is compatible with mild edema. Small pericardial effusion. Extensive calcific coronary artery disease. Electronically Signed   By: Drusilla Kanner M.D.   On: 04/01/2016 13:15   Ct Cervical Spine Wo  Contrast  03/30/2016  CLINICAL DATA:  Initial encounter for MULTIPLE RECENT FALLS, TODAY PT. FELL BUT DENIES LOC, GENERALIZED WEAKNESS, HX ESRD-DM-SYNCOPE-HTN-CAD, LW EXAM: CT HEAD WITHOUT CONTRAST CT CERVICAL SPINE WITHOUT CONTRAST TECHNIQUE: Multidetector CT imaging of the head and cervical spine was performed following the standard protocol without intravenous contrast. Multiplanar CT image reconstructions of the cervical spine were also generated. COMPARISON:  Head CT of 12/26/2015. Prior head and cervical spine CTs 12/25/2015. FINDINGS: CT HEAD FINDINGS Sinuses/Soft tissues: No significant soft tissue swelling. Mild ethmoid air cell mucosal thickening. No skull fracture. Clear mastoid air cells. Intracranial: Marked improvement to resolution of previously described subdural collection adjacent the left frontal lobe. Minimal prominence of extra-axial spaces remains, including image 17/series 201. Hypo attenuating. Bilateral vertebral artery atherosclerosis. Moderate low density in the periventricular white matter likely related to small vessel disease. Resolved right-sided extra-axial/subdural fluid collection. 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 No acute large vessel cortically based infarct. No new hemorrhage, intra-axial, or extra-axial fluid collection. No mass lesion. CT CERVICAL SPINE FINDINGS Spinal visualization through the bottom of T2. Prevertebral soft tissues are within normal limits. Left worse than right carotid atherosclerosis. A right-sided central line is incompletely imaged. Small right pleural effusion is similar to 12/25/2015. No apical pneumothorax. Base interlobular septal thickening which suggests interstitial edema. Skull base intact.  Skull base intact. Trace C2-3 anterolisthesis is similar and likely degenerative. Maintenance of vertebral body height. Straightening of expected lordosis. Facets are well-aligned. Multilevel spondylosis, with loss of intervertebral disc  height and endplate osteophyte formation. C1-2 articulation demonstrates only degenerative change on coronal reformats. IMPRESSION: 1. No acute intracranial abnormality. Cerebral atrophy is small vessel ischemic change. 2. Resolved right and markedly improved to resolved left-sided subdural fluid collections. There is minimal residual prominence of the left extra-axial space adjacent the frontal lobe. 3. Cervical spondylosis, without acute fracture or subluxation. Straightening of expected cervical lordosis could be positional, due to muscular spasm, or ligamentous injury. 4. Similar small right pleural effusion. Suspect interstitial edema/congestive heart failure. Electronically Signed   By: Jeronimo Greaves M.D.   On: 03/30/2016 17:36   Mr Lumbar Spine Wo Contrast  04/05/2016  CLINICAL DATA:  75 year old male with end-stage renal disease, back pain since December. Falls. Bacteremia. Initial encounter. EXAM: MRI LUMBAR SPINE WITHOUT CONTRAST TECHNIQUE: Multiplanar, multisequence MR imaging of the lumbar spine was performed. No intravenous contrast was administered. COMPARISON:  Lumbar radiographs 03/30/2016. FINDINGS: Normal lumbar segmentation demonstrated on the comparison. The patient was unable to lie flat. Subsequently The examination had to be discontinued prior to completion. Only sagittal imaging could be obtained, and is motion degraded. Diffusely decreased T1 and T2 bone marrow signal. Abnormal STIR hyperintensity in the L4 inferior endplate and posterior vertebral body (series 4, image 3 and series 3, image 10. There is also mild associated abnormal increased STIR signal in the adjacent L5 superior endplate, but this appears somewhat focal like a Schmorl node. The posterior vertebral body signal abnormality appears somewhat contiguous with the abnormal intervertebral disc region signal. This could represent a leftward disc extrusion extending into the left L4 neural foramen. No evidence of acute osseous  abnormality at the other visualized levels. No significant lumbar spinal stenosis. IMPRESSION: 1. The examination had to be discontinued prior to completion due to the patient's inability to lie flat. 2. Abnormal signal in the L4 disc and adjacent L4-L5 endplates. However, favor this is due to a disc herniation extending into the left L4 neural foramen. Is there lateralizing left hip or lower extremity pain, such as due to left L4 radiculitis? 3. L4-L5 discitis osteomyelitis felt less likely. Repeat lumbar MRI to include axial imaging may be confirmatory when possible. 4. 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 Electronically Signed   By: Odessa Fleming M.D.   On: 04/05/2016 17:46   Dg Chest Port 1 View  04/06/2016  CLINICAL DATA:  Central line placement, status post dialysis catheter insertion EXAM: PORTABLE CHEST 1 VIEW COMPARISON:  Chest radiograph dated 04/05/2016 FINDINGS: Lungs are clear.  No pleural effusion or pneumothorax. The heart is top-normal in size. Left IJ dual lumen dialysis catheter with its distal tip in the upper right atrium. IMPRESSION: Left-sided dual lumen dialysis catheter with its distal tip in the upper right atrium. No pneumothorax. Electronically Signed   By: Charline Bills M.D.   On: 04/06/2016 10:11   Dg Chest Port 1 View  04/05/2016  CLINICAL DATA:  Shortness of Breath EXAM: PORTABLE CHEST 1 VIEW COMPARISON:  04/03/2016 FINDINGS: Cardiac shadow is stable. The previously seen dialysis catheter has been removed in the interval. The lungs are well aerated bilaterally. Mild increased density is noted over both lungs which may be related to small effusions. No focal infiltrate is seen. No bony abnormality is noted. IMPRESSION: Question small effusions. Clinical correlation is recommended. Upright films when the patient is able are also recommended. Electronically Signed   By: Alcide Clever M.D.   On: 04/05/2016 18:26   Dg Fluoro Guide Cv Line-no  Report  04/06/2016  CLINICAL DATA:  FLOURO GUIDE CV LINE Fluoroscopy was utilized by the requesting physician.  No radiographic interpretation.     LOS: 8 days   Jeoffrey Massed, MD  [facility] Pager:[phone]  If 7PM-7AM, please contact night-coverage [email or web address] Password [facility] 04/10/2016, 9:17 AM

## 2016-04-11 ENCOUNTER — Encounter: Payer: Self-pay | Admitting: Vascular Surgery

## 2016-04-11 LAB — RENAL FUNCTION PANEL
ANION GAP: 8 (ref 5–15)
Albumin: 2.1 g/dL — ABNORMAL LOW (ref 3.5–5.0)
BUN: 30 mg/dL — ABNORMAL HIGH (ref 6–20)
CALCIUM: 7.6 mg/dL — AB (ref 8.9–10.3)
CHLORIDE: 98 mmol/L — AB (ref 101–111)
CO2: 28 mmol/L (ref 22–32)
Creatinine, Ser: 2.93 mg/dL — ABNORMAL HIGH (ref 0.61–1.24)
GFR calc non Af Amer: 20 mL/min — ABNORMAL LOW (ref >60)
GFR, EST AFRICAN AMERICAN: 23 mL/min — AB (ref >60)
GLUCOSE: 153 mg/dL — AB (ref 65–99)
Phosphorus: 3 mg/dL (ref 2.5–4.6)
Potassium: 3.8 mmol/L (ref 3.5–5.1)
SODIUM: 134 mmol/L — AB (ref 135–145)

## 2016-04-11 LAB — CBC
HCT: 30.5 % — ABNORMAL LOW (ref 39.0–52.0)
HEMOGLOBIN: 9.6 g/dL — AB (ref 13.0–17.0)
MCH: 29.6 pg (ref 26.0–34.0)
MCHC: 31.5 g/dL (ref 30.0–36.0)
MCV: 94.1 fL (ref 78.0–100.0)
Platelets: 146 10*3/uL — ABNORMAL LOW (ref 150–400)
RBC: 3.24 MIL/uL — AB (ref 4.22–5.81)
RDW: 21.3 % — ABNORMAL HIGH (ref 11.5–15.5)
WBC: 5.3 10*3/uL (ref 4.0–10.5)

## 2016-04-11 LAB — GLUCOSE, CAPILLARY: GLUCOSE-CAPILLARY: 103 mg/dL — AB (ref 65–99)

## 2016-04-11 LAB — VANCOMYCIN, RANDOM: VANCOMYCIN RM: 10 ug/mL

## 2016-04-11 MED ORDER — VANCOMYCIN HCL 500 MG IV SOLR: 500.0000 mg | INTRAVENOUS | Status: DC

## 2016-04-11 MED ORDER — ALTEPLASE 2 MG IJ SOLR: 2.0000 mg | Freq: Once | INTRAMUSCULAR | Status: DC | PRN

## 2016-04-11 MED ORDER — LIDOCAINE-PRILOCAINE 2.5-2.5 % EX CREA: 1.0000 "application " | TOPICAL_CREAM | CUTANEOUS | Status: DC | PRN

## 2016-04-11 MED ORDER — SODIUM CHLORIDE 0.9 % IV SOLN: 100.0000 mL | INTRAVENOUS | Status: DC | PRN

## 2016-04-11 MED ORDER — VANCOMYCIN HCL IN DEXTROSE 750-5 MG/150ML-% IV SOLN: 750.0000 mg | INTRAVENOUS | Status: DC

## 2016-04-11 MED ORDER — HEPARIN SODIUM (PORCINE) 1000 UNIT/ML DIALYSIS: 1000.0000 [IU] | INTRAMUSCULAR | Status: DC | PRN

## 2016-04-11 MED ORDER — SODIUM CHLORIDE 0.9 % IV SOLN
1250.0000 mg | Freq: Once | INTRAVENOUS | Status: AC
  Administered 2016-04-11: 1250 mg via INTRAVENOUS
  Filled 2016-04-11: qty 1250

## 2016-04-11 MED ORDER — ACETAMINOPHEN 500 MG PO TABS: 1000.0000 mg | ORAL_TABLET | Freq: Three times a day (TID) | ORAL | Status: AC

## 2016-04-11 MED ORDER — PRO-STAT SUGAR FREE PO LIQD: 30.0000 mL | Freq: Two times a day (BID) | ORAL | Status: AC

## 2016-04-11 MED ORDER — OXYCODONE HCL 5 MG PO TABS: 5.0000 mg | ORAL_TABLET | Freq: Four times a day (QID) | ORAL | Status: DC | PRN

## 2016-04-11 MED ORDER — METOPROLOL SUCCINATE ER 25 MG PO TB24: 25.0000 mg | ORAL_TABLET | Freq: Every day | ORAL | Status: DC

## 2016-04-11 MED ORDER — ALPRAZOLAM 0.25 MG PO TABS: 0.2500 mg | ORAL_TABLET | Freq: Three times a day (TID) | ORAL | Status: DC | PRN

## 2016-04-11 MED ORDER — LISINOPRIL 20 MG PO TABS: 10.0000 mg | ORAL_TABLET | Freq: Every day | ORAL | Status: AC

## 2016-04-11 MED ORDER — INSULIN ASPART 100 UNIT/ML ~~LOC~~ SOLN: SUBCUTANEOUS | Status: AC

## 2016-04-11 MED ORDER — BOOST / RESOURCE BREEZE PO LIQD: 1.0000 | Freq: Two times a day (BID) | ORAL | Status: AC

## 2016-04-11 MED ORDER — ZOLPIDEM TARTRATE 5 MG PO TABS: 5.0000 mg | ORAL_TABLET | Freq: Every evening | ORAL | Status: DC | PRN

## 2016-04-11 MED ORDER — LIDOCAINE HCL (PF) 1 % IJ SOLN: 5.0000 mL | INTRAMUSCULAR | Status: DC | PRN

## 2016-04-11 MED ORDER — PENTAFLUOROPROP-TETRAFLUOROETH EX AERO: 1.0000 "application " | INHALATION_SPRAY | CUTANEOUS | Status: DC | PRN

## 2016-04-11 NOTE — Progress Notes (Signed)
PT Cancellation Note  Patient Details Name: Jeffrey Walls MRN: [id number] DOB: 07/19/1941   Cancelled Treatment:    Reason Eval/Treat Not Completed: Patient at procedure or test/unavailable.  HD in chair today.  If tolerates, to d/c to SNF following HD.   Vena AustriaDavis, [name] 04/11/2016, 1:15 PM [name], PT, [facility] Pager [phone]

## 2016-04-11 NOTE — Clinical Social Work Note (Signed)
[facility] pending patient's ability to sit for dialysis. Patient's daughter updated regarding potential discharge. RN to update patient's daughter once discharge confirmed. [phone]) RN to call for PTAR once discharge confirmed. [phone]  Marcelline Deistmily Aldan Camey, LCSW [phone] Orthopedics: [phone] Surgical: [phone]

## 2016-04-11 NOTE — Discharge Summary (Addendum)
PATIENT DETAILS Name: Jeffrey Walls Age: 75 y.o. Sex: male Date of Birth: November 05, 1941 MRN: [id number]. Admitting Physician: Haydee Salter, MD WUJ:[id number], MD  Admit Date: 03/30/2016 Discharge date: 04/11/2016  Recommendations for Outpatient Follow-up:  1. 6 weeks of IV Vancomycin from 04/04/16 2. Weekly CBC/CMET/Vanco levels while on IV Vancomycin 3. Once completes IV Vancomycin needs a repeat TTE or a TEE to see if right atrial mass is still persistent  PRIMARY DISCHARGE DIAGNOSIS:  Principal Problem:   Bacteremia, coagulase-negative staphylococcal Active Problems:   ESRD on hemodialysis (HCC)   Anemia of renal disease   Frequent falls   Essential hypertension   Diabetes mellitus with diabetic nephropathy, with long-term current use of insulin (HCC)   Protein-calorie malnutrition, severe   Syncope   Elevated troponin   Acute combined systolic and diastolic heart failure (HCC)   ESRD (end stage renal disease) (HCC)      PAST MEDICAL HISTORY: Past Medical History  Diagnosis Date  . Renal disorder   . Hypertension   . Diabetes mellitus without complication (HCC)   . Coronary artery disease   . Shortness of breath dyspnea   . ANCA-associated vasculitis (HCC)   . Pneumocystis jiroveci pneumonia (HCC)     DISCHARGE MEDICATIONS: Current Discharge Medication List    START taking these medications   Details  acetaminophen (TYLENOL) 500 MG tablet Take 2 tablets (1,000 mg total) by mouth 3 (three) times daily.    ALPRAZolam (XANAX) 0.25 MG tablet Take 1 tablet (0.25 mg total) by mouth 3 (three) times daily as needed for anxiety. Qty: 20 tablet, Refills: 0    Amino Acids-Protein Hydrolys (FEEDING SUPPLEMENT, PRO-STAT SUGAR FREE 64,) LIQD Take 30 mLs by mouth 2 (two) times daily.    feeding supplement (BOOST / RESOURCE BREEZE) LIQD Take 1 Container by mouth 2 (two) times daily between meals. Refills: 0    insulin aspart (NOVOLOG) 100 UNIT/ML injection 0-9  Units, Subcutaneous, 3 times daily with meals CBG < 70: implement hypoglycemia protocol CBG 70 - 120: 0 units CBG 121 - 150: 1 unit CBG 151 - 200: 2 units CBG 201 - 250: 3 units CBG 251 - 300: 5 units CBG 301 - 350: 7 units CBG 351 - 400: 9 units CBG > 400: call MD    oxyCODONE (OXY IR/ROXICODONE) 5 MG immediate release tablet Take 1-2 tablets (5-10 mg total) by mouth every 6 (six) hours as needed for severe pain. Qty: 30 tablet, Refills: 0    Vancomycin (VANCOCIN) 750-5 MG/150ML-% SOLN Inject 150 mLs (750 mg total) into the vein every Monday, Wednesday, and Friday with hemodialysis. Qty: 4000 mL      CONTINUE these medications which have CHANGED   Details  lisinopril (PRINIVIL,ZESTRIL) 20 MG tablet Take 0.5 tablets (10 mg total) by mouth daily.    metoprolol succinate (TOPROL-XL) 25 MG 24 hr tablet Take 1 tablet (25 mg total) by mouth daily.    zolpidem (AMBIEN) 5 MG tablet Take 1 tablet (5 mg total) by mouth at bedtime as needed. Sleep Qty: 15 tablet, Refills: 0      CONTINUE these medications which have NOT CHANGED   Details  aspirin EC 81 MG tablet Take 81 mg by mouth daily.    citalopram (CELEXA) 20 MG tablet Take 20 mg by mouth daily.    folic acid (FOLVITE) 1 MG tablet Take 1 mg by mouth daily.    pantoprazole (PROTONIX) 20 MG tablet Take 20 mg by mouth daily.  vitamin B-12 (CYANOCOBALAMIN) 1000 MCG tablet Place 1,000 mcg under the tongue daily.       STOP taking these medications     FLUDROCORTISONE ACETATE PO      Insulin Glargine (LANTUS SOLOSTAR) 100 UNIT/ML Solostar Pen      oxyCODONE-acetaminophen (PERCOCET/ROXICET) 5-325 MG tablet         ALLERGIES:   Allergies  Allergen Reactions  . Ampicillin Rash    Ankle swelling  . Benadryl [Diphenhydramine Hcl] Itching    Rash    BRIEF HPI:  See H&P, Labs, Consult and Test reports for all details in brief, Patient is a 75 y.o. male with past medical history of ESRD (MPO + Anca associated  vasculitis-off all immunosuppressants) on hemodialysis presented on 4/16 with syncopal episode. Hospital course complicated by development of acute systolic heart failure and fever on 4/19 with blood cultures positive for coag-negative staph-felt to be secondary to dialysis catheter related blood stream infection.  CONSULTATIONS:   cardiology, ID, nephrology and vascular surgery  PERTINENT RADIOLOGIC STUDIES: Dg Chest 1 View  03/30/2016  CLINICAL DATA:  Syncopal episode EXAM: CHEST 1 VIEW COMPARISON:  02/28/2016 FINDINGS: Cardiac shadow is stable. Right jugular dialysis catheter is again seen and stable. The lungs are well aerated bilaterally. No focal infiltrate or sizable effusion is seen. No significant edema is seen. No bony abnormality is noted. IMPRESSION: No active disease. Electronically Signed   By: Alcide Clever M.D.   On: 03/30/2016 16:43   Dg Chest 2 View  04/03/2016  CLINICAL DATA:  Short of breath today.  Pleural effusions. EXAM: CHEST  2 VIEW COMPARISON:  Chest CT, 04/01/2016.  Chest radiograph, 03/30/2016. FINDINGS: Cardiac silhouette is normal in size and configuration. No mediastinal or hilar masses or pathologically enlarged lymph nodes. Right internal jugular dual-lumen tunneled central venous catheter has its tip projecting in the right atrium. This is stable. There are small, right greater than left, pleural effusions. These appear smaller than noted on the recent prior CT. There is no convincing pneumonia or pulmonary edema. No pneumothorax. IMPRESSION: 1. Small, right greater left, pleural effusions which appears smaller than they were on the recent prior chest CT. 2. No evidence of pneumonia or pulmonary edema. Electronically Signed   By: Amie Portland M.D.   On: 04/03/2016 12:44   Dg Thoracic Spine 2 View  03/30/2016  CLINICAL DATA:  Upper and mid back pain after falling today. EXAM: THORACIC SPINE 2 VIEWS COMPARISON:  02/28/2016 chest radiograph FINDINGS: Right-sided dialysis  catheter with tip at cavoatrial junction or high right atrium. Lateral view images from approximately the bottom of C6 through the bottom of T11. Moderate spondylosis across these levels, without vertebral body height loss. Lower thoracic vertebral body height maintained on lumbar spine radiographs, dictated separately. IMPRESSION: Spondylosis, without acute finding in the thoracic spine. Electronically Signed   By: Jeronimo Greaves M.D.   On: 03/30/2016 16:44   Dg Lumbar Spine Complete  03/30/2016  CLINICAL DATA:  Patient status post fall. Mid and lower back pain. Initial encounter. EXAM: LUMBAR SPINE - COMPLETE 4+ VIEW COMPARISON:  None. FINDINGS: Mild leftward curvature of the lumbar spine. Preservation of the vertebral body and intervertebral disc space heights. SI joints are unremarkable. No evidence for acute lumbar spine fracture. Lower lumbar spine degenerative disc and facet disease. IMPRESSION: Degenerative disc disease.  No acute osseous abnormality. Electronically Signed   By: Annia Belt M.D.   On: 03/30/2016 16:43   Ct Head Wo Contrast  03/30/2016  CLINICAL DATA:  Initial encounter for MULTIPLE RECENT FALLS, TODAY PT. FELL BUT DENIES LOC, GENERALIZED WEAKNESS, HX ESRD-DM-SYNCOPE-HTN-CAD, LW EXAM: CT HEAD WITHOUT CONTRAST CT CERVICAL SPINE WITHOUT CONTRAST TECHNIQUE: Multidetector CT imaging of the head and cervical spine was performed following the standard protocol without intravenous contrast. Multiplanar CT image reconstructions of the cervical spine were also generated. COMPARISON:  Head CT of 12/26/2015. Prior head and cervical spine CTs 12/25/2015. FINDINGS: CT HEAD FINDINGS Sinuses/Soft tissues: No significant soft tissue swelling. Mild ethmoid air cell mucosal thickening. No skull fracture. Clear mastoid air cells. Intracranial: Marked improvement to resolution of previously described subdural collection adjacent the left frontal lobe. Minimal prominence of extra-axial spaces remains,  including image 17/series 201. Hypo attenuating. Bilateral vertebral artery atherosclerosis. Moderate low density in the periventricular white matter likely related to small vessel disease. Resolved right-sided extra-axial/subdural fluid collection. 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 No acute large vessel cortically based infarct. No new hemorrhage, intra-axial, or extra-axial fluid collection. No mass lesion. CT CERVICAL SPINE FINDINGS Spinal visualization through the bottom of T2. Prevertebral soft tissues are within normal limits. Left worse than right carotid atherosclerosis. A right-sided central line is incompletely imaged. Small right pleural effusion is similar to 12/25/2015. No apical pneumothorax. Base interlobular septal thickening which suggests interstitial edema. Skull base intact.  Skull base intact. Trace C2-3 anterolisthesis is similar and likely degenerative. Maintenance of vertebral body height. Straightening of expected lordosis. Facets are well-aligned. Multilevel spondylosis, with loss of intervertebral disc height and endplate osteophyte formation. C1-2 articulation demonstrates only degenerative change on coronal reformats. IMPRESSION: 1. No acute intracranial abnormality. Cerebral atrophy is small vessel ischemic change. 2. Resolved right and markedly improved to resolved left-sided subdural fluid collections. There is minimal residual prominence of the left extra-axial space adjacent the frontal lobe. 3. Cervical spondylosis, without acute fracture or subluxation. Straightening of expected cervical lordosis could be positional, due to muscular spasm, or ligamentous injury. 4. Similar small right pleural effusion. Suspect interstitial edema/congestive heart failure. Electronically Signed   By: Jeronimo Greaves M.D.   On: 03/30/2016 17:36   Ct Chest Wo Contrast  04/01/2016  CLINICAL DATA:  Shortness of breath today in patient admitted to the hospital with a chief complaint of  syncope on 03/30/2016. The patient denies cough. End-stage renal disease. Initial encounter. EXAM: CT CHEST WITHOUT CONTRAST TECHNIQUE: Multidetector CT imaging of the chest was performed following the standard protocol without IV contrast. COMPARISON:  PA and lateral chest 02/28/2016. FINDINGS: The patient has moderate bilateral pleural effusions, greater on the right. Mild cardiomegaly is seen. Small pericardial effusion is identified. Extensive calcific coronary artery disease is present. Dialysis catheter from a right IJ approach is noted. There is no axillary, hilar or mediastinal lymphadenopathy. The lungs demonstrate mild compressive atelectasis secondary to the patient's pleural effusions, slightly greater on the right. There is also some interlobular septal thickening in the upper lobes which is worse on the left. The lungs are otherwise unremarkable. Visualized upper abdomen demonstrates extensive atherosclerosis. No focal abnormality is seen. No focal bony abnormality is identified. IMPRESSION: Moderate bilateral pleural effusions, larger on the right. Interlobular septal thickening in the upper lobes bilaterally is compatible with mild edema. Small pericardial effusion. Extensive calcific coronary artery disease. Electronically Signed   By: Drusilla Kanner M.D.   On: 04/01/2016 13:15   Ct Cervical Spine Wo Contrast  03/30/2016  CLINICAL DATA:  Initial encounter for MULTIPLE RECENT FALLS, TODAY PT. FELL BUT DENIES LOC, GENERALIZED WEAKNESS, HX  ESRD-DM-SYNCOPE-HTN-CAD, LW EXAM: CT HEAD WITHOUT CONTRAST CT CERVICAL SPINE WITHOUT CONTRAST TECHNIQUE: Multidetector CT imaging of the head and cervical spine was performed following the standard protocol without intravenous contrast. Multiplanar CT image reconstructions of the cervical spine were also generated. COMPARISON:  Head CT of 12/26/2015. Prior head and cervical spine CTs 12/25/2015. FINDINGS: CT HEAD FINDINGS Sinuses/Soft tissues: No significant  soft tissue swelling. Mild ethmoid air cell mucosal thickening. No skull fracture. Clear mastoid air cells. Intracranial: Marked improvement to resolution of previously described subdural collection adjacent the left frontal lobe. Minimal prominence of extra-axial spaces remains, including image 17/series 201. Hypo attenuating. Bilateral vertebral artery atherosclerosis. Moderate low density in the periventricular white matter likely related to small vessel disease. Resolved right-sided extra-axial/subdural fluid collection. 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 No acute large vessel cortically based infarct. No new hemorrhage, intra-axial, or extra-axial fluid collection. No mass lesion. CT CERVICAL SPINE FINDINGS Spinal visualization through the bottom of T2. Prevertebral soft tissues are within normal limits. Left worse than right carotid atherosclerosis. A right-sided central line is incompletely imaged. Small right pleural effusion is similar to 12/25/2015. No apical pneumothorax. Base interlobular septal thickening which suggests interstitial edema. Skull base intact.  Skull base intact. Trace C2-3 anterolisthesis is similar and likely degenerative. Maintenance of vertebral body height. Straightening of expected lordosis. Facets are well-aligned. Multilevel spondylosis, with loss of intervertebral disc height and endplate osteophyte formation. C1-2 articulation demonstrates only degenerative change on coronal reformats. IMPRESSION: 1. No acute intracranial abnormality. Cerebral atrophy is small vessel ischemic change. 2. Resolved right and markedly improved to resolved left-sided subdural fluid collections. There is minimal residual prominence of the left extra-axial space adjacent the frontal lobe. 3. Cervical spondylosis, without acute fracture or subluxation. Straightening of expected cervical lordosis could be positional, due to muscular spasm, or ligamentous injury. 4. Similar small right  pleural effusion. Suspect interstitial edema/congestive heart failure. Electronically Signed   By: Jeronimo Greaves M.D.   On: 03/30/2016 17:36   Mr Lumbar Spine Wo Contrast  04/05/2016  CLINICAL DATA:  75 year old male with end-stage renal disease, back pain since December. Falls. Bacteremia. Initial encounter. EXAM: MRI LUMBAR SPINE WITHOUT CONTRAST TECHNIQUE: Multiplanar, multisequence MR imaging of the lumbar spine was performed. No intravenous contrast was administered. COMPARISON:  Lumbar radiographs 03/30/2016. FINDINGS: Normal lumbar segmentation demonstrated on the comparison. The patient was unable to lie flat. Subsequently The examination had to be discontinued prior to completion. Only sagittal imaging could be obtained, and is motion degraded. Diffusely decreased T1 and T2 bone marrow signal. Abnormal STIR hyperintensity in the L4 inferior endplate and posterior vertebral body (series 4, image 3 and series 3, image 10. There is also mild associated abnormal increased STIR signal in the adjacent L5 superior endplate, but this appears somewhat focal like a Schmorl node. The posterior vertebral body signal abnormality appears somewhat contiguous with the abnormal intervertebral disc region signal. This could represent a leftward disc extrusion extending into the left L4 neural foramen. No evidence of acute osseous abnormality at the other visualized levels. No significant lumbar spinal stenosis. IMPRESSION: 1. The examination had to be discontinued prior to completion due to the patient's inability to lie flat. 2. Abnormal signal in the L4 disc and adjacent L4-L5 endplates. However, favor this is due to a disc herniation extending into the left L4 neural foramen. Is there lateralizing left hip or lower extremity pain, such as due to left L4 radiculitis? 3. L4-L5 discitis osteomyelitis felt less likely. Repeat  lumbar MRI to include axial imaging may be confirmatory when possible. 4. 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 Electronically Signed   By: Odessa FlemingH  Hall M.D.   On: 04/05/2016 17:46   Dg Chest Port 1 View  04/06/2016  CLINICAL DATA:  Central line placement, status post dialysis catheter insertion EXAM: PORTABLE CHEST 1 VIEW COMPARISON:  Chest radiograph dated 04/05/2016 FINDINGS: Lungs are clear.  No pleural effusion or pneumothorax. The heart is top-normal in size. Left IJ dual lumen dialysis catheter with its distal tip in the upper right atrium. IMPRESSION: Left-sided dual lumen dialysis catheter with its distal tip in the upper right atrium. No pneumothorax. Electronically Signed   By: Charline BillsSriyesh  Krishnan M.D.   On: 04/06/2016 10:11   Dg Chest Port 1 View  04/05/2016  CLINICAL DATA:  Shortness of Breath EXAM: PORTABLE CHEST 1 VIEW COMPARISON:  04/03/2016 FINDINGS: Cardiac shadow is stable. The previously seen dialysis catheter has been removed in the interval. The lungs are well aerated bilaterally. Mild increased density is noted over both lungs which may be related to small effusions. No focal infiltrate is seen. No bony abnormality is noted. IMPRESSION: Question small effusions. Clinical correlation is recommended. Upright films when the patient is able are also recommended. Electronically Signed   By: Alcide CleverMark  Lukens M.D.   On: 04/05/2016 18:26   Dg Fluoro Guide Cv Line-no Report  04/06/2016  CLINICAL DATA:  FLOURO GUIDE CV LINE Fluoroscopy was utilized by the requesting physician.  No radiographic interpretation.     PERTINENT LAB RESULTS: CBC:  Recent Labs  04/09/16 0755 04/11/16 1219  WBC 5.4 5.3  HGB 8.7* 9.6*  HCT 27.6* 30.5*  PLT 137* 146*   CMET CMP     Component Value Date/Time   NA 134* 04/11/2016 1219   K 3.8 04/11/2016 1219   CL 98* 04/11/2016 1219   CO2 28 04/11/2016 1219   GLUCOSE 153* 04/11/2016 1219   BUN 30* 04/11/2016 1219   CREATININE 2.93* 04/11/2016 1219   CALCIUM 7.6* 04/11/2016 1219   PROT 4.3* 03/30/2016 1447    ALBUMIN 2.1* 04/11/2016 1219   AST 26 03/30/2016 1447   ALT 19 03/30/2016 1447   ALKPHOS 102 03/30/2016 1447   BILITOT 1.2 03/30/2016 1447   GFRNONAA 20* 04/11/2016 1219   GFRAA 23* 04/11/2016 1219    GFR Estimated Creatinine Clearance: 16.8 mL/min (by C-G formula based on Cr of 2.93). No results for input(s): LIPASE, AMYLASE in the last 72 hours. No results for input(s): CKTOTAL, CKMB, CKMBINDEX, TROPONINI in the last 72 hours. Invalid input(s): POCBNP No results for input(s): DDIMER in the last 72 hours. No results for input(s): HGBA1C in the last 72 hours. No results for input(s): CHOL, HDL, LDLCALC, TRIG, CHOLHDL, LDLDIRECT in the last 72 hours. No results for input(s): TSH, T4TOTAL, T3FREE, THYROIDAB in the last 72 hours.  Invalid input(s): FREET3 No results for input(s): VITAMINB12, FOLATE, FERRITIN, TIBC, IRON, RETICCTPCT in the last 72 hours. Coags: No results for input(s): INR in the last 72 hours.  Invalid input(s): PT Microbiology: Recent Results (from the past 240 hour(s))  Culture, blood (routine x 2)     Status: Abnormal   Collection Time: 04/02/16  4:05 PM  Result Value Ref Range Status   Specimen Description BLOOD HEMODIALYSIS LINE  Final   Special Requests BOTTLES DRAWN AEROBIC AND ANAEROBIC 10CC  Final   Culture  Setup Time   Final    GRAM POSITIVE COCCI IN  CLUSTERS IN BOTH AEROBIC AND ANAEROBIC BOTTLES CRITICAL RESULT CALLED TO, READ BACK BY AND VERIFIED WITH: Patsey Berthold RN 13:15 04/03/16 ([email or web address])    Culture STAPHYLOCOCCUS SPECIES (COAGULASE NEGATIVE) (A)  Final   Report Status 04/05/2016 FINAL  Final   Organism ID, Bacteria STAPHYLOCOCCUS SPECIES (COAGULASE NEGATIVE)  Final      Susceptibility   Staphylococcus species (coagulase negative) - MIC*    CIPROFLOXACIN >=8 RESISTANT Resistant     ERYTHROMYCIN <=0.25 SENSITIVE Sensitive     GENTAMICIN <=0.5 SENSITIVE Sensitive     OXACILLIN >=4 RESISTANT Resistant     TETRACYCLINE <=1 SENSITIVE Sensitive       VANCOMYCIN 1 SENSITIVE Sensitive     TRIMETH/SULFA 160 RESISTANT Resistant     CLINDAMYCIN <=0.25 SENSITIVE Sensitive     RIFAMPIN <=0.5 SENSITIVE Sensitive     Inducible Clindamycin NEGATIVE Sensitive     * STAPHYLOCOCCUS SPECIES (COAGULASE NEGATIVE)  Culture, blood (routine x 2)     Status: Abnormal   Collection Time: 04/02/16  4:35 PM  Result Value Ref Range Status   Specimen Description BLOOD RIGHT ARM  Final   Special Requests   Final    BOTTLES DRAWN AEROBIC AND ANAEROBIC 4CC ANA 5CC AER   Culture  Setup Time   Final    GRAM POSITIVE COCCI IN CLUSTERS IN BOTH AEROBIC AND ANAEROBIC BOTTLES CRITICAL RESULT CALLED TO, READ BACK BY AND VERIFIED WITH: Patsey Berthold RN 16:45 04/03/16 ([email or web address])    Culture (A)  Final    STAPHYLOCOCCUS SPECIES (COAGULASE NEGATIVE) SUSCEPTIBILITIES PERFORMED ON PREVIOUS CULTURE WITHIN THE LAST 5 DAYS.    Report Status 04/05/2016 FINAL  Final  Culture, blood (Routine X 2) w Reflex to ID Panel     Status: None   Collection Time: 04/04/16  3:28 PM  Result Value Ref Range Status   Specimen Description BLOOD RIGHT HAND  Final   Special Requests IN PEDIATRIC BOTTLE 0.5CC  Final   Culture NO GROWTH 5 DAYS  Final   Report Status 04/09/2016 FINAL  Final  Cath Tip Culture     Status: None   Collection Time: 04/04/16  5:19 PM  Result Value Ref Range Status   Specimen Description CATH TIP RIGHT NECK  Final   Special Requests NONE  Final   Culture   Final    NO GROWTH 2 DAYS Performed at [facility]    Report Status 04/07/2016 FINAL  Final     BRIEF HOSPITAL COURSE:  Brief Narrative: Patient is a 76 y.o. 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 Hospital course complicated by development of acute systolic heart failure and fever on 4/19 with blood cultures positive for coag-negative staph-felt to be secondary to dialysis catheter  related blood stream infection.Also found to have a right atrial mass on transthoracic echocardiogram on 4/19. Hemodialysis catheter has been removed by vascular surgery.TEE on 4/24 confirmed atrial mass and revealed EF of 35-40%. Plans are to complete 6 weeks of IV antibiotics and repeat TTE or a TEE to see if right atrial mass is still persistent.  Hospital course by problem list: Bacteremia, coagulase-negative staphylococcal: Probably secondary to hemodialysis catheter-related bloodstream infection. Hemodialysis catheter has been removed on 4/21, repeat blood cultures on 4/21 are negative, cath tip cultures are also negative so far. Infectious disease following, recommendations are to continue intravenous vancomycin for 6 weeks. MRI of the lumbosacral spine-although incomplete study-does not show  discitis/osteomyelitis. TEE confirms a right atrial mass.Plans are to repeat Echo once completes 6 weeks of IV Vancomycin.  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 Echo shows mildly decreased EF around 40%. Lower Ext Doppler negative.Telemetry negative. Spoke with cardiologist-Dr. Johney Frame on 4/22-who reviewed chart, did not advise anticoagulate patient in this setting.   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 TEE confirmed atrial mass -which is likely related to infected HD catheter or may be a vegetation-recommendations from cardiology are to repeat TEE after he completes antibiotic course   Acute systolic heart failure: compensated.EF by TEE on 4/25 35-40%, diffuse hypokinesis. Volume removal hemodialysis. Nephrology slowly decreasing dry weight.   Bilateral pleural effusion: Suspect related to CHF. Volume removal with hemodialysis. Doubt  needs thoracocentesis at this point-as chest x-ray on 4/23 demonstrates no pleural effusion.  Elevated troponin: Likely false-positive elevation in the setting of ESRD-trend is flat not consistent with ACS. However does have known history of CAD. Plans are to manage conservatively (recent SDH)  Pulmonary hypertension: PA pressure of 60 mmHg via transthoracic echocardiogram, reviewed outpatient echocardiogram (September 2016) in care everywhere- showed mild pulmonary hypertension. Suspect this is related to underlying Anca vasculitis.   ESRD: 2/ MPO + Anca associated vasculitis-treated at [facility] now off all immunosuppressants.On hemodialysis MWF. Nephrology followed patient throughout the hospital stay. Tolerated HD well  Addendum 4:30 pm Informed by RN-patient able to sit up for HD, ok for discharge today  Hypertension: BP controlled, continue lisinopril and metoprolol-may need to further adjust dosing depending on post dialysis dry weight.  Type 2 diabetes: CBGs stable-continue SSI.   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 Back pain much better with scheduled tylenol.   Anemia of renal disease: Management as per nephrology  Thrombocytopenia:Could be related to bacteremia, improving.   Frequent falls: non focal exam-has recent hx of SDH Jan 2017  Protein-calorie malnutrition, severe: Continue supplements.   TODAY-DAY OF DISCHARGE:  Subjective:   Broady Lafoy today has no headache,no chest abdominal pain,no new weakness tingling or numbness  Objective:   Blood pressure 126/72, pulse 86, temperature 97 F (36.1 C), temperature source Oral, resp. rate 10, height 5\' 9"  (1.753 m), weight 52.8 kg (116 lb 6.5 oz), SpO2 100 %.  Intake/Output Summary (Last 24 hours) at 04/11/16 1422 Last data filed at 04/11/16 8295  Gross per 24 hour  Intake    240 ml  Output     176 ml  Net     64 ml   Filed Weights   04/09/16 0732 04/09/16 1138 04/11/16 1139  Weight: 52.7 kg (116 lb 2.9 oz) 51.4 kg (113 lb 5.1 oz) 52.8 kg (116 lb 6.5 oz)    Exam Awake Alert, Oriented *3, No new F.N deficits, Normal affect [place].AT,PERRAL Supple Neck,No JVD, No cervical lymphadenopathy appriciated.  Symmetrical Chest wall movement, Good air movement bilaterally, CTAB RRR,No Gallops,Rubs or new Murmurs, No Parasternal Heave +ve B.Sounds, Abd Soft, Non tender, No organomegaly appriciated, No rebound -guarding or rigidity. No Cyanosis, Clubbing or edema, No new Rash or bruise  DISCHARGE CONDITION: Stable  DISPOSITION: SNF  DISCHARGE INSTRUCTIONS:    Activity:  As tolerated with Full fall precautions use walker/cane & assistance as  needed  Get Medicines reviewed and adjusted: Please take all your medications with you for your next visit with your Primary MD  Please request your Primary MD to go over all hospital tests and procedure/radiological results at the follow up, please ask your Primary MD to get all Hospital records sent to his/her office.  If you experience worsening of your admission symptoms, develop shortness of breath, life threatening emergency, suicidal or homicidal thoughts you must seek medical attention immediately by calling 911 or calling your MD immediately  if symptoms less severe.  You must read complete instructions/literature along with all the possible adverse reactions/side effects for all the Medicines you take and that have been prescribed to you. Take any new Medicines after you have completely understood and accpet all the possible adverse reactions/side effects.   Do not drive when taking Pain medications.   Do not take more than prescribed Pain, Sleep and Anxiety Medications  Special Instructions: If you have smoked or chewed Tobacco  in the last 2 yrs please stop smoking, stop any regular Alcohol  and or any Recreational drug use.  Wear  Seat belts while driving.  Please note  You were cared for by a hospitalist during your hospital stay. Once you are discharged, your primary care physician will handle any further medical issues. Please note that NO REFILLS for any discharge medications will be authorized once you are discharged, as it is imperative that you return to your primary care physician (or establish a relationship with a primary care physician if you do not have one) for your aftercare needs so that they can reassess your need for medications and monitor your lab values.   Diet recommendation: Diabetic Diet Heart Healthy diet   Discharge Instructions    Diet - low sodium heart healthy    Complete by:  As directed      Increase activity slowly    Complete by:  As directed            Follow-up Information    Follow up with [facility], MD. Schedule an appointment as soon as possible for a visit in 2 weeks.   Specialty:  Internal Medicine   Contact information:   [address] [place] [address] [phone]       Please follow up.   Contact information:   Hemodialysis center-MWF      Total Time spent on discharge equals 45 minutes.  SignedJeoffrey Massed 04/11/2016 2:22 PM

## 2016-04-11 NOTE — Progress Notes (Signed)
Pt tolerated sitting in chair for HD. Pt's daughter updated on discharge status and [facility] called for transportation.

## 2016-04-11 NOTE — Discharge Summary (Signed)
Denies any back pain today Sitting up at bedside and eating breakfast Plan is to see if he tolerates HD in a recliner-if so will discharge later today.

## 2016-04-11 NOTE — Progress Notes (Signed)
Pharmacy Antibiotic Note  Jeffrey Walls is a 75 y.o. male admitted on 03/30/2016 with syncope.  Abx have been narrowed to Vancomycin only for CoNS bacteremia.  Today is day #9 of therapy.  HD access removed 4/21.  TEE with vegetation vs. Myxoma.  ID recommending 6 weeks of vancomycin for CNS/MRSE bacteremia.  Pre- HD vancomycin level = 10 (goal 15-25); est post-HD level only 5-6  Plan: Reload vancomycin with 1250 mg x 1.  Then increase vancomycin dose to 750 mg IV qHD. Per MD note pt to discharge after HD.   Height: 5\' 9"  (175.3 cm) Weight: 116 lb 6.5 oz (52.8 kg) (Standing Scale) IBW/kg (Calculated) : 70.7  Temp (24hrs), Avg:97.9 F (36.6 C), Min:97 F (36.1 C), Max:98.4 F (36.9 C)   Recent Labs Lab 04/06/16 0530 04/07/16 0521 04/09/16 0754 04/09/16 0755 04/09/16 1153 04/11/16 0703  WBC 5.6 4.9  --  5.4  --   --   CREATININE 2.91*  2.92*  --  3.17*  --   --   --   VANCORANDOM  --   --   --   --  15 10    Estimated Creatinine Clearance: 15.5 mL/min (by C-G formula based on Cr of 3.17).    Allergies  Allergen Reactions  . Ampicillin Rash    Ankle swelling  . Benadryl [Diphenhydramine Hcl] Itching    Rash    Antimicrobials this admission: Vanc 4/19 >>  Elita QuickFortaz 4/19 >> 4/19  Dose adjustments this admission: n/a  Microbiology results: 4/21 cath tip: ngF 4/21 BCx: neg 4/19 BCx: 2/2 CoNS 4/19 UCx: multiple species  4/16 MRSA screen neg  Thank you for allowing pharmacy to be a part of this patient's care.  Tad MooreJessica Jaan Fischel, Pharm D, BCPS  Clinical Pharmacist Pager [phone]  04/11/2016 12:35 PM   04/11/2016 12:35 PM

## 2016-04-11 NOTE — Progress Notes (Signed)
[facility] Progress Note  Assessment/Plan: 1. MRSE cath sepsis - cath removed 4/21, repeat BC's neg so far, new cath placed on 4/23. 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 TDC replaced . On IV vancomycin for total 6 week course. 2. 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 Possible vegetation related to previous catheter, myxoma cannot be excluded, and will need repeat TEE post course of vancomycin 3. Syncopal episode. Mildly reduced EF (40%). No further episodes of syncope reported.  4. Systolic heart failure/pulmonary edema. Wt now under OP EDW. .  5. Bilateral pleural effusions - largely resolved on 4/23 CXR.  6. ESRD - Continue MWF ([place]). [facility] 4/23 (L RC AVF done 03/04/16) staff advised he will be d/c today - need to do HD next avail spot 7. Anemia - HGB 8.7. ESA dose 04/24. Aranesp increased to 100 4/24. (prev on Mircera outpt last dosed 4/12 at 30 mcg)  8. HTN/Volume - BB and lisinopril. - need standing wts pre and post HD today- lower EDW at d/c 9. Nutrition: Renal/Carb mod diet with prostat/renal vits 10. BMD: Ca 7.9 C Ca 9.34 Phos 3.4. No binders/sensipar/no VDRA.  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 Off all immunosuppression at this time.  12. DM per primary. BS 69-122.  13. Back pain - + disc herniation and per ID +osteo/discitis (I thought MRI was inconclusive...) 14. Deconditioning 15. Hx falls/ SDH January. No heparin with HD outpt. 16. Dispo - to SNF when ready PLAN: recliner HD today to verify tolerance for outpt HD  [name], PA-C  [facility] [phone] 04/11/2016,10:27 AM  LOS: 9 days   Subjective:   Back pain is not as bad as it was.  Objective Filed Vitals:   04/10/16 1722 04/10/16 2024 04/11/16 0538 04/11/16 0803  BP: 149/61 115/52 153/65 158/67  Pulse: 61 61 68 66  Temp: 98 F (36.7 C) 97.7 F (36.5 C) 98.4 F (36.9 C) 98.3 F  (36.8 C)  TempSrc: Oral Oral Oral Oral  Resp: Height:      Weight:      SpO2: 97% 100% 100% 100%   Physical Exam General: supine in bed sleeping -rouses easily Heart:  RRR Lungs: grossly clear Abdomen: soft NT Extremities: no edema Dialysis Access: left upper AVF + bruit and left IJ placed 4/23  Dialysis Orders: MWF [facility] 56kg 2/2.25 bath Hep none LUA AVF (maturing)/ R IJ cath 4 hr Mircera 30 ug every 2, last 4/12 Hep lock for cath tfs 27%, ferr 2787 Hb 10.5 pth 115 Ca/P ok  Additional Objective Labs: Basic Metabolic Panel:  Recent Labs Lab 04/06/16 0530 04/09/16 0754  NA 135 134*  K 3.9 3.9  CL 99* 100*  CO2 26 28  GLUCOSE 97 97  BUN 14 23*  CREATININE 2.91*  2.92* 3.17*  CALCIUM 7.9* 7.9*  PHOS 1.9* 3.4   Liver Function Tests:  Recent Labs Lab 04/06/16 0530 04/09/16 0754  ALBUMIN 2.1* 2.2*  CBC:  Recent Labs Lab 04/04/16 1217 04/06/16 0530 04/07/16 0521 04/09/16 0755  WBC 3.9* 5.6 4.9 5.4  HGB 9.3* 9.2* 8.5* 8.7*  HCT 29.1* 28.6* 27.2* 27.6*  MCV 90.1 89.9 89.8 89.6  PLT 92* 135* 123* 137*   Blood Culture    Component Value Date/Time   SDES CATH TIP RIGHT NECK 04/04/2016 1719   SPECREQUEST NONE 04/04/2016 1719   CULT  04/04/2016 1719    NO GROWTH 2 DAYS Performed at [facility]    REPTSTATUS 04/07/2016 FINAL 04/04/2016 1719     Recent Labs Lab 04/10/16 0759 04/10/16 1158 04/10/16 1712 04/10/16 2132 04/11/16 0758  GLUCAP 79 99 147* 157* 103*  Medications:   . acetaminophen  1,000 mg Oral TID  . albumin human  25 g Intravenous Once  . aspirin EC  81 mg Oral Daily  . citalopram  20 mg Oral Daily  . darbepoetin (ARANESP) injection - DIALYSIS  100 mcg Intravenous Q Mon-HD  . enoxaparin (LOVENOX) injection  30 mg Subcutaneous Q24H  . feeding supplement  1 Container Oral BID BM  . feeding supplement (PRO-STAT SUGAR FREE 64)  30 mL Oral BID  . folic acid  1 mg Oral Daily  . insulin aspart  0-9 Units  Subcutaneous TID WC  . lisinopril  10 mg Oral Daily  . metoprolol succinate  25 mg Oral Daily  . pantoprazole  20 mg Oral Daily  . sodium chloride flush  3 mL Intravenous Q12H  . vancomycin  500 mg Intravenous Q M,W,F-HD

## 2016-04-11 NOTE — Care Management Important Message (Signed)
Important Message  Patient Details  Name: Jeffrey Walls MRN: [id number] Date of Birth: 11/22/1941   Medicare Important Message Given:  Yes    Tamicka Shimon, Annamarie MajorCheryl U, RN 04/11/2016, 11:35 AM

## 2016-04-16 ENCOUNTER — Encounter: Payer: Medicare HMO | Admitting: Vascular Surgery

## 2016-05-14 ENCOUNTER — Encounter (HOSPITAL_COMMUNITY): Payer: Self-pay

## 2016-05-14 ENCOUNTER — Emergency Department (HOSPITAL_COMMUNITY): Payer: Medicare HMO

## 2016-05-14 ENCOUNTER — Inpatient Hospital Stay (HOSPITAL_COMMUNITY)
Admission: EM | Admit: 2016-05-14 | Discharge: 2016-05-23 | DRG: 853 | Disposition: A | Discharge status: Skilled Nursing Facility | Payer: Medicare HMO | Attending: Internal Medicine | Admitting: Internal Medicine

## 2016-05-14 ENCOUNTER — Inpatient Hospital Stay (HOSPITAL_COMMUNITY): Payer: Medicare HMO

## 2016-05-14 DIAGNOSIS — B3781 Candidal esophagitis: Secondary | ICD-10-CM | POA: Diagnosis not present

## 2016-05-14 DIAGNOSIS — D631 Anemia in chronic kidney disease: Secondary | ICD-10-CM | POA: Diagnosis present

## 2016-05-14 DIAGNOSIS — A4189 Other specified sepsis: Secondary | ICD-10-CM | POA: Diagnosis present

## 2016-05-14 DIAGNOSIS — I5042 Chronic combined systolic (congestive) and diastolic (congestive) heart failure: Secondary | ICD-10-CM | POA: Diagnosis present

## 2016-05-14 DIAGNOSIS — Z7982 Long term (current) use of aspirin: Secondary | ICD-10-CM

## 2016-05-14 DIAGNOSIS — Z794 Long term (current) use of insulin: Secondary | ICD-10-CM | POA: Diagnosis not present

## 2016-05-14 DIAGNOSIS — T827XXD Infection and inflammatory reaction due to other cardiac and vascular devices, implants and grafts, subsequent encounter: Secondary | ICD-10-CM | POA: Diagnosis not present

## 2016-05-14 DIAGNOSIS — I132 Hypertensive heart and chronic kidney disease with heart failure and with stage 5 chronic kidney disease, or end stage renal disease: Secondary | ICD-10-CM | POA: Diagnosis present

## 2016-05-14 DIAGNOSIS — R222 Localized swelling, mass and lump, trunk: Secondary | ICD-10-CM | POA: Diagnosis not present

## 2016-05-14 DIAGNOSIS — Z992 Dependence on renal dialysis: Secondary | ICD-10-CM

## 2016-05-14 DIAGNOSIS — R1310 Dysphagia, unspecified: Secondary | ICD-10-CM

## 2016-05-14 DIAGNOSIS — M4646 Discitis, unspecified, lumbar region: Secondary | ICD-10-CM | POA: Diagnosis present

## 2016-05-14 DIAGNOSIS — I2510 Atherosclerotic heart disease of native coronary artery without angina pectoris: Secondary | ICD-10-CM | POA: Diagnosis present

## 2016-05-14 DIAGNOSIS — T82898A Other specified complication of vascular prosthetic devices, implants and grafts, initial encounter: Secondary | ICD-10-CM | POA: Diagnosis not present

## 2016-05-14 DIAGNOSIS — Z681 Body mass index (BMI) 19 or less, adult: Secondary | ICD-10-CM

## 2016-05-14 DIAGNOSIS — D61818 Other pancytopenia: Secondary | ICD-10-CM | POA: Diagnosis present

## 2016-05-14 DIAGNOSIS — R1314 Dysphagia, pharyngoesophageal phase: Secondary | ICD-10-CM | POA: Diagnosis not present

## 2016-05-14 DIAGNOSIS — R1319 Other dysphagia: Secondary | ICD-10-CM | POA: Diagnosis present

## 2016-05-14 DIAGNOSIS — G9340 Encephalopathy, unspecified: Secondary | ICD-10-CM | POA: Diagnosis not present

## 2016-05-14 DIAGNOSIS — G9341 Metabolic encephalopathy: Secondary | ICD-10-CM | POA: Diagnosis present

## 2016-05-14 DIAGNOSIS — M4640 Discitis, unspecified, site unspecified: Secondary | ICD-10-CM | POA: Insufficient documentation

## 2016-05-14 DIAGNOSIS — Z79899 Other long term (current) drug therapy: Secondary | ICD-10-CM | POA: Diagnosis not present

## 2016-05-14 DIAGNOSIS — E8889 Other specified metabolic disorders: Secondary | ICD-10-CM | POA: Diagnosis present

## 2016-05-14 DIAGNOSIS — N186 End stage renal disease: Secondary | ICD-10-CM | POA: Diagnosis present

## 2016-05-14 DIAGNOSIS — N189 Chronic kidney disease, unspecified: Secondary | ICD-10-CM

## 2016-05-14 DIAGNOSIS — B961 Klebsiella pneumoniae [K. pneumoniae] as the cause of diseases classified elsewhere: Secondary | ICD-10-CM | POA: Diagnosis not present

## 2016-05-14 DIAGNOSIS — I10 Essential (primary) hypertension: Secondary | ICD-10-CM | POA: Diagnosis present

## 2016-05-14 DIAGNOSIS — E876 Hypokalemia: Secondary | ICD-10-CM | POA: Diagnosis present

## 2016-05-14 DIAGNOSIS — Z95828 Presence of other vascular implants and grafts: Secondary | ICD-10-CM

## 2016-05-14 DIAGNOSIS — M4806 Spinal stenosis, lumbar region: Secondary | ICD-10-CM | POA: Diagnosis present

## 2016-05-14 DIAGNOSIS — T827XXA Infection and inflammatory reaction due to other cardiac and vascular devices, implants and grafts, initial encounter: Secondary | ICD-10-CM | POA: Diagnosis not present

## 2016-05-14 DIAGNOSIS — E1121 Type 2 diabetes mellitus with diabetic nephropathy: Secondary | ICD-10-CM | POA: Diagnosis not present

## 2016-05-14 DIAGNOSIS — E43 Unspecified severe protein-calorie malnutrition: Secondary | ICD-10-CM

## 2016-05-14 DIAGNOSIS — M4626 Osteomyelitis of vertebra, lumbar region: Secondary | ICD-10-CM | POA: Diagnosis present

## 2016-05-14 DIAGNOSIS — G061 Intraspinal abscess and granuloma: Secondary | ICD-10-CM | POA: Diagnosis present

## 2016-05-14 DIAGNOSIS — B957 Other staphylococcus as the cause of diseases classified elsewhere: Secondary | ICD-10-CM | POA: Diagnosis present

## 2016-05-14 DIAGNOSIS — R7881 Bacteremia: Secondary | ICD-10-CM | POA: Diagnosis not present

## 2016-05-14 DIAGNOSIS — R509 Fever, unspecified: Secondary | ICD-10-CM | POA: Insufficient documentation

## 2016-05-14 DIAGNOSIS — E871 Hypo-osmolality and hyponatremia: Secondary | ICD-10-CM | POA: Diagnosis present

## 2016-05-14 DIAGNOSIS — M549 Dorsalgia, unspecified: Secondary | ICD-10-CM | POA: Diagnosis present

## 2016-05-14 DIAGNOSIS — A419 Sepsis, unspecified organism: Secondary | ICD-10-CM

## 2016-05-14 DIAGNOSIS — E1169 Type 2 diabetes mellitus with other specified complication: Secondary | ICD-10-CM | POA: Diagnosis present

## 2016-05-14 DIAGNOSIS — E1122 Type 2 diabetes mellitus with diabetic chronic kidney disease: Secondary | ICD-10-CM | POA: Diagnosis present

## 2016-05-14 LAB — CBC WITH DIFFERENTIAL/PLATELET
BASOS ABS: 0 10*3/uL (ref 0.0–0.1)
BASOS PCT: 0 %
Eosinophils Absolute: 0 10*3/uL (ref 0.0–0.7)
Eosinophils Relative: 0 %
HEMATOCRIT: 31.9 % — AB (ref 39.0–52.0)
Hemoglobin: 10.1 g/dL — ABNORMAL LOW (ref 13.0–17.0)
LYMPHS PCT: 7 %
Lymphs Abs: 0.6 10*3/uL — ABNORMAL LOW (ref 0.7–4.0)
MCH: 28.1 pg (ref 26.0–34.0)
MCHC: 31.7 g/dL (ref 30.0–36.0)
MCV: 88.9 fL (ref 78.0–100.0)
Monocytes Absolute: 0.6 10*3/uL (ref 0.1–1.0)
Monocytes Relative: 7 %
NEUTROS ABS: 7 10*3/uL (ref 1.7–7.7)
NEUTROS PCT: 86 %
PLATELETS: 174 10*3/uL (ref 150–400)
RBC: 3.59 MIL/uL — AB (ref 4.22–5.81)
RDW: 16.6 % — ABNORMAL HIGH (ref 11.5–15.5)
WBC: 8.1 10*3/uL (ref 4.0–10.5)

## 2016-05-14 LAB — BASIC METABOLIC PANEL
ANION GAP: 10 (ref 5–15)
BUN: 11 mg/dL (ref 6–20)
CALCIUM: 7.8 mg/dL — AB (ref 8.9–10.3)
CO2: 28 mmol/L (ref 22–32)
Chloride: 97 mmol/L — ABNORMAL LOW (ref 101–111)
Creatinine, Ser: 2.24 mg/dL — ABNORMAL HIGH (ref 0.61–1.24)
GFR, EST AFRICAN AMERICAN: 31 mL/min — AB (ref >60)
GFR, EST NON AFRICAN AMERICAN: 27 mL/min — AB (ref >60)
Glucose, Bld: 86 mg/dL (ref 65–99)
POTASSIUM: 2.9 mmol/L — AB (ref 3.5–5.1)
Sodium: 135 mmol/L (ref 135–145)

## 2016-05-14 LAB — GLUCOSE, CAPILLARY: Glucose-Capillary: 90 mg/dL (ref 65–99)

## 2016-05-14 LAB — I-STAT CG4 LACTIC ACID, ED: Lactic Acid, Venous: 0.8 mmol/L (ref 0.5–2.0)

## 2016-05-14 MED ORDER — OXYCODONE HCL 5 MG PO TABS
5.0000 mg | ORAL_TABLET | ORAL | Status: DC | PRN
  Administered 2016-05-15 – 2016-05-22 (×16): 5 mg via ORAL
  Filled 2016-05-14 (×16): qty 1

## 2016-05-14 MED ORDER — LORAZEPAM 1 MG PO TABS
1.0000 mg | ORAL_TABLET | Freq: Once | ORAL | Status: AC
  Administered 2016-05-14: 1 mg via ORAL
  Filled 2016-05-14: qty 1

## 2016-05-14 MED ORDER — VANCOMYCIN HCL IN DEXTROSE 1-5 GM/200ML-% IV SOLN
1000.0000 mg | Freq: Once | INTRAVENOUS | Status: AC
  Administered 2016-05-14: 1000 mg via INTRAVENOUS

## 2016-05-14 MED ORDER — VANCOMYCIN HCL 500 MG IV SOLR: 500.0000 mg | INTRAVENOUS | Status: DC

## 2016-05-14 MED ORDER — SODIUM CHLORIDE 0.9% FLUSH
3.0000 mL | Freq: Two times a day (BID) | INTRAVENOUS | Status: DC
  Administered 2016-05-15 – 2016-05-22 (×15): 3 mL via INTRAVENOUS

## 2016-05-14 MED ORDER — INSULIN ASPART 100 UNIT/ML ~~LOC~~ SOLN
0.0000 [IU] | Freq: Three times a day (TID) | SUBCUTANEOUS | Status: DC
  Administered 2016-05-18 – 2016-05-19 (×2): 2 [IU] via SUBCUTANEOUS
  Administered 2016-05-20: 3 [IU] via SUBCUTANEOUS
  Administered 2016-05-20 – 2016-05-22 (×2): 2 [IU] via SUBCUTANEOUS
  Administered 2016-05-22: 1 [IU] via SUBCUTANEOUS

## 2016-05-14 MED ORDER — SODIUM CHLORIDE 0.9 % IV BOLUS (SEPSIS)
1000.0000 mL | Freq: Once | INTRAVENOUS | Status: AC
  Administered 2016-05-14: 1000 mL via INTRAVENOUS

## 2016-05-14 MED ORDER — OXYCODONE-ACETAMINOPHEN 5-325 MG PO TABS
1.0000 | ORAL_TABLET | Freq: Once | ORAL | Status: AC
  Administered 2016-05-14: 1 via ORAL
  Filled 2016-05-14: qty 1

## 2016-05-14 NOTE — ED Provider Notes (Signed)
CSN: [id number]     Arrival date & time 05/14/16  1356 History   First MD Initiated Contact with Patient 05/14/16 1423     Chief Complaint  Patient presents with  . Back Pain     (Consider location/radiation/quality/duration/timing/severity/associated sxs/prior Treatment) HPI  Past Medical History  Diagnosis Date  . Renal disorder   . Hypertension   . Diabetes mellitus without complication (HCC)   . Coronary artery disease   . Shortness of breath dyspnea   . ANCA-associated vasculitis (HCC)   . 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 Av fistula placement Left 03/04/2016    Procedure: ARTERIOVENOUS (AV) FISTULA CREATION LEFT RADIOCEPHALIC;  Surgeon: Chuck Hint, MD;  Location: [facility] OR;  Service: Vascular;  Laterality: Left;  . Insertion of dialysis catheter N/A 04/06/2016    Procedure: INSERTION OF DIALYSIS CATHETER;  Surgeon: Sherren Kerns, MD;  Location: [facility] OR;  Service: Vascular;  Laterality: N/A;  . Tee without cardioversion N/A 04/08/2016    Procedure: TRANSESOPHAGEAL ECHOCARDIOGRAM (TEE);  Surgeon: Laurey Morale, MD;  Location: [facility] ENDOSCOPY;  Service: Cardiovascular;  Laterality: N/A;   History reviewed. No pertinent family history. Social History  Substance Use Topics  . Smoking status: Never Smoker   . Smokeless tobacco: None  . Alcohol Use: No    Review of Systems    Allergies  Ampicillin and Benadryl  Home Medications   Prior to Admission medications   Medication Sig Start Date End Date Taking? Authorizing Provider  acetaminophen (TYLENOL) 500 MG tablet Take 2 tablets (1,000 mg total) by mouth 3 (three) times daily. 04/11/16   Shanker Levora Dredge, MD  ALPRAZolam Prudy Feeler) 0.25 MG tablet Take 1 tablet (0.25 mg total) by mouth 3 (three) times daily as needed for anxiety. 04/11/16   Shanker Levora Dredge, MD  Amino Acids-Protein Hydrolys (FEEDING SUPPLEMENT, PRO-STAT SUGAR FREE 64,) LIQD Take 30 mLs by  mouth 2 (two) times daily. 04/11/16   Shanker Levora Dredge, MD  aspirin EC 81 MG tablet Take 81 mg by mouth daily.    Historical Provider, MD  citalopram (CELEXA) 20 MG tablet Take 20 mg by mouth daily.    Historical Provider, MD  feeding supplement (BOOST / RESOURCE BREEZE) LIQD Take 1 Container by mouth 2 (two) times daily between meals. 04/11/16   Shanker Levora Dredge, MD  folic acid (FOLVITE) 1 MG tablet Take 1 mg by mouth daily. 08/30/15   Historical Provider, MD  insulin aspart (NOVOLOG) 100 UNIT/ML injection 0-9 Units, Subcutaneous, 3 times daily with meals CBG < 70: implement hypoglycemia protocol CBG 70 - 120: 0 units CBG 121 - 150: 1 unit CBG 151 - 200: 2 units CBG 201 - 250: 3 units CBG 251 - 300: 5 units CBG 301 - 350: 7 units CBG 351 - 400: 9 units CBG > 400: call MD 04/11/16   Maretta Bees, MD  lisinopril (PRINIVIL,ZESTRIL) 20 MG tablet Take 0.5 tablets (10 mg total) by mouth daily. 04/11/16   Shanker Levora Dredge, MD  metoprolol succinate (TOPROL-XL) 25 MG 24 hr tablet Take 1 tablet (25 mg total) by mouth daily. 04/11/16   Shanker Levora Dredge, MD  oxyCODONE (OXY IR/ROXICODONE) 5 MG immediate release tablet Take 1-2 tablets (5-10 mg total) by mouth every 6 (six) hours as needed for severe pain. 04/11/16   Shanker Levora Dredge, MD  pantoprazole (PROTONIX) 20 MG tablet Take 20 mg by mouth daily. 08/18/15 08/17/16  Historical  Provider, MD  Vancomycin [facility]) 750-5 MG/150ML-% SOLN Inject 150 mLs (750 mg total) into the vein every Monday, Wednesday, and Friday with hemodialysis. 04/11/16   Shanker Levora Dredge, MD  vitamin B-12 (CYANOCOBALAMIN) 1000 MCG tablet Place 1,000 mcg under the tongue daily.     Historical Provider, MD  zolpidem (AMBIEN) 5 MG tablet Take 1 tablet (5 mg total) by mouth at bedtime as needed. Sleep 04/11/16   Maretta Bees, MD   BP 128/62 mmHg  Pulse 94  Temp(Src) 101 F (38.3 C) (Rectal)  Resp 17  SpO2 92% Physical Exam  ED Course  Procedures (including critical care  time) Labs Review Labs Reviewed  BASIC METABOLIC PANEL - Abnormal; Notable for the following:    Potassium 2.9 (*)    Chloride 97 (*)    Creatinine, Ser 2.24 (*)    Calcium 7.8 (*)    GFR calc non Af Amer 27 (*)    GFR calc Af Amer 31 (*)    All other components within normal limits  CBC WITH DIFFERENTIAL/PLATELET - Abnormal; Notable for the following:    RBC 3.59 (*)    Hemoglobin 10.1 (*)    HCT 31.9 (*)    RDW 16.6 (*)    Lymphs Abs 0.6 (*)    All other components within normal limits  CULTURE, BLOOD (ROUTINE X 2)  CULTURE, BLOOD (ROUTINE X 2)  URINE CULTURE  URINALYSIS, ROUTINE W REFLEX MICROSCOPIC (NOT AT [facility])  I-STAT CG4 LACTIC ACID, ED    Imaging Review Ct Head Wo Contrast  05/14/2016  CLINICAL DATA:  Fall today. Altered mental status. Agitation/confusion. 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 EXAM: CT HEAD WITHOUT CONTRAST TECHNIQUE: Contiguous axial images were obtained from the base of the skull through the vertex without intravenous contrast. COMPARISON:  05/09/2016 FINDINGS: Small remote lacunar infarcts in the left lentiform nucleus. Otherwise, the brainstem, cerebellum, cerebral peduncles, thalami, basal ganglia, basilar cisterns, and ventricular system appear within normal limits. Periventricular white matter and corona radiata hypodensities favor chronic ischemic microvascular white matter disease. No intracranial hemorrhage, mass lesion, or acute CVA. IMPRESSION: 1. No acute intracranial findings. 2. Periventricular white matter and corona radiata hypodensities favor chronic ischemic microvascular white matter disease. 3. 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 Electronically Signed   By: Gaylyn Rong M.D.   On: 05/14/2016 16:51   Ct Lumbar Spine Wo Contrast  05/14/2016  CLINICAL DATA:  Fall on Tuesday. Pain in the back. This is worsened. EXAM: CT LUMBAR SPINE WITHOUT CONTRAST TECHNIQUE: Multidetector CT imaging of the lumbar spine was  performed without intravenous contrast administration. Multiplanar CT image reconstructions were also generated. COMPARISON:  05/09/2016 FINDINGS: Acute 15% superior endplate compression fracture at T12 with endplate sclerosis and some cortical irregularity. No significant bony retropulsion. This is new compared to the MRI from 04/05/2016. Abnormal erosive findings along the inferior endplate of L4 and superior endplate of L5. These lack the characteristic sclerotic margin for Schmorl's nodes, and moreover there is some sclerosis and interval flattening along the anterior superior endplate of L5 compared to the 04/05/2016 exam. These heighten my concern that the findings at L4-5 are probably due to discitis -osteomyelitis. There is also prominence of the paraspinal tissues at this level deep to the psoas muscles, probably inflammatory. There is also a suggestion of degenerative disc disease at this level potentially causing foraminal impingement. Strictly speaking I cannot exclude epidural abscess given the slight epidural prominence in this vicinity and concern for discitis.  Along the inferior endplate of L3 there is a more typical appearing Schmorl' s node, with sclerotic margin. No vertebral subluxation is observed. The is a right twelfth rib fracture posteriorly on image 39/2 L1. Bridging spurring of the left sacroiliac joint. Aortoiliac atherosclerotic vascular disease. Bilateral pleural effusions. Bilateral perirenal stranding. Diffuse subcutaneous and mesenteric edema. IMPRESSION: 1. 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 2. There is also a new 15% superior endplate compression fracture T12 as well as a nondisplaced fracture of the right T12 rib posteriorly.  3. Bilateral pleural effusions are at least moderate in size but only partially characterized on today' s exam. 4. Perirenal, subcutaneous, and mesenteric edema. Electronically Signed   By: Gaylyn RongWalter  Liebkemann M.D.   On: 05/14/2016 17:10   I have personally reviewed and evaluated these images and lab results as part of my medical decision-making.   EKG Interpretation None      MDM  PT D/W DR. GHERGHE ([facility]) WHO WILL ADMIT PT.  HE ASKED ME TO SPEAK WITH NS BECAUSE OF THE POSSIBLE EPIDURAL ABSCESS.  I SPOKE WITH DR. CRAM WHO WILL SEE PT LATER.  NOTHING ACUTE HE NEEDS TO DO.  HE REQUESTED THAT WE TRY TO GET AN MRI.  WE WILL CONTINUE PT ON VANCOMYCIN UNTIL DR. GHERGHE SPEAKS WITH ID.   Final diagnoses:  Discitis of lumbar region  Other specified fever  ESRD (end stage renal disease) (HCC)       Jacalyn LefevreJulie Kamiryn Bezanson, MD 05/14/16 2259

## 2016-05-14 NOTE — ED Notes (Signed)
Pt c/o lower back pain after a fall last Tuesday.  Pt was transported to hospital and all results were negative.  Pt got aprox. 1.5hrs of dialysis today before refusing to continue due to the back pain.

## 2016-05-14 NOTE — ED Provider Notes (Signed)
CSN: [id number]     Arrival date & time 05/14/16  1356 History   First MD Initiated Contact with Patient 05/14/16 1423     Chief Complaint  Patient presents with  . Back Pain     (Consider location/radiation/quality/duration/timing/severity/associated sxs/prior Treatment) HPI  75 year old male presents from dialysis with back pain. Patient apparently fell a few weeks ago and has been having back pain since. He reports he was transferred to the hospital and had negative workup. He states the back pain has been progressively worsening. Today he demanded the dialysis stop after 1.5 hours due to pain. Denies any radiation of the pain. No abdominal pain or weakness or numbness. History is limited because patient seems confused. Unknown baseline. Patient keeps complaining of being cold. Then later speaks normally and calmly.  Past Medical History  Diagnosis Date  . Renal disorder   . Hypertension   . Diabetes mellitus without complication (HCC)   . Coronary artery disease   . Shortness of breath dyspnea   . ANCA-associated vasculitis (HCC)   . 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 Av fistula placement Left 03/04/2016    Procedure: ARTERIOVENOUS (AV) FISTULA CREATION LEFT RADIOCEPHALIC;  Surgeon: [name], MD;  [facility];  Service: Vascular;  Laterality: Left;  . Insertion of dialysis catheter N/A 04/06/2016    Procedure: INSERTION OF DIALYSIS CATHETER;  Surgeon: Sherren Kernsharles E Fields, MD;  [facility];  Service: Vascular;  Laterality: N/A;  . Tee without cardioversion N/A 04/08/2016    Procedure: TRANSESOPHAGEAL ECHOCARDIOGRAM (TEE);  Surgeon: Laurey Moralealton S McLean, MD;  [facility];  Service: Cardiovascular;  Laterality: N/A;   History reviewed. No pertinent family history. Social History  Substance Use Topics  . Smoking status: Never Smoker   . Smokeless tobacco: None  . Alcohol Use: No    Review of Systems   Unable to perform ROS: Mental status change      Allergies  Ampicillin and Benadryl  Home Medications   Prior to Admission medications   Medication Sig Start Date End Date Taking? Authorizing Provider  acetaminophen (TYLENOL) 500 MG tablet Take 2 tablets (1,000 mg total) by mouth 3 (three) times daily. 04/11/16   Shanker Levora DredgeM Ghimire, MD  ALPRAZolam Prudy Feeler(XANAX) 0.25 MG tablet Take 1 tablet (0.25 mg total) by mouth 3 (three) times daily as needed for anxiety. 04/11/16   Shanker Levora DredgeM Ghimire, MD  Amino Acids-Protein Hydrolys (FEEDING SUPPLEMENT, PRO-STAT SUGAR FREE 64,) LIQD Take 30 mLs by mouth 2 (two) times daily. 04/11/16   Shanker Levora DredgeM Ghimire, MD  aspirin EC 81 MG tablet Take 81 mg by mouth daily.    Historical Provider, MD  citalopram (CELEXA) 20 MG tablet Take 20 mg by mouth daily.    Historical Provider, MD  feeding supplement (BOOST / RESOURCE BREEZE) LIQD Take 1 Container by mouth 2 (two) times daily between meals. 04/11/16   Shanker Levora DredgeM Ghimire, MD  folic acid (FOLVITE) 1 MG tablet Take 1 mg by mouth daily. 08/30/15   Historical Provider, MD  insulin aspart (NOVOLOG) 100 UNIT/ML injection 0-9 Units, Subcutaneous, 3 times daily with meals CBG < 70: implement hypoglycemia protocol CBG 70 - 120: 0 units CBG 121 - 150: 1 unit CBG 151 - 200: 2 units CBG 201 - 250: 3 units CBG 251 - 300: 5 units CBG 301 - 350: 7 units CBG 351 - 400: 9 units CBG > 400: call MD 04/11/16  Shanker Levora Dredge, MD  lisinopril (PRINIVIL,ZESTRIL) 20 MG tablet Take 0.5 tablets (10 mg total) by mouth daily. 04/11/16   Shanker Levora Dredge, MD  metoprolol succinate (TOPROL-XL) 25 MG 24 hr tablet Take 1 tablet (25 mg total) by mouth daily. 04/11/16   Shanker Levora Dredge, MD  oxyCODONE (OXY IR/ROXICODONE) 5 MG immediate release tablet Take 1-2 tablets (5-10 mg total) by mouth every 6 (six) hours as needed for severe pain. 04/11/16   Shanker Levora Dredge, MD  pantoprazole (PROTONIX) 20 MG tablet Take 20 mg by mouth daily. 08/18/15 08/17/16   Historical Provider, MD  Vancomycin (VANCOCIN) 750-5 MG/150ML-% SOLN Inject 150 mLs (750 mg total) into the vein every Monday, Wednesday, and Friday with hemodialysis. 04/11/16   Shanker Levora Dredge, MD  vitamin B-12 (CYANOCOBALAMIN) 1000 MCG tablet Place 1,000 mcg under the tongue daily.     Historical Provider, MD  zolpidem (AMBIEN) 5 MG tablet Take 1 tablet (5 mg total) by mouth at bedtime as needed. Sleep 04/11/16   Maretta Bees, MD   BP 167/85 mmHg  Pulse 98  Temp(Src) 98.8 F (37.1 C) (Oral)  Resp 19  SpO2 98% Physical Exam  Constitutional: He appears well-developed and well-nourished.  HENT:  Head: Normocephalic and atraumatic.  Right Ear: External ear normal.  Left Ear: External ear normal.  Nose: Nose normal.  Eyes: Right eye exhibits no discharge. Left eye exhibits no discharge.  Neck: Neck supple.  Cardiovascular: Normal rate, regular rhythm, normal heart sounds and intact distal pulses.   Pulmonary/Chest: Effort normal and breath sounds normal.  Abdominal: Soft. He exhibits no distension. There is no tenderness.  Musculoskeletal: He exhibits no edema.       Thoracic back: He exhibits no tenderness.       Lumbar back: He exhibits tenderness and bony tenderness (midline. No stepoffs/deformities).  Neurological: He is alert. He is disoriented. He displays no Babinski's sign on the right side. He displays no Babinski's sign on the left side.  Reflex Scores:      Patellar reflexes are 2+ on the right side and 2+ on the left side.      Achilles reflexes are 2+ on the right side and 2+ on the left side. 5/5 strength in all 4 extremities. Grossly normal sensation.  Skin: Skin is warm and dry.  Nursing note and vitals reviewed.   ED Course  Procedures (including critical care time) Labs Review Labs Reviewed  BASIC METABOLIC PANEL - Abnormal; Notable for the following:    Potassium 2.9 (*)    Chloride 97 (*)    Creatinine, Ser 2.24 (*)    Calcium 7.8 (*)    GFR calc non  Af Amer 27 (*)    GFR calc Af Amer 31 (*)    All other components within normal limits  CBC WITH DIFFERENTIAL/PLATELET - Abnormal; Notable for the following:    RBC 3.59 (*)    Hemoglobin 10.1 (*)    HCT 31.9 (*)    RDW 16.6 (*)    Lymphs Abs 0.6 (*)    All other components within normal limits  URINALYSIS, ROUTINE W REFLEX MICROSCOPIC (NOT AT [facility])    Imaging Review No results found. I have personally reviewed and evaluated these images and lab results as part of my medical decision-making.   EKG Interpretation None      MDM   Final diagnoses:  None    Patient presents with low back pain, at least subacute. History limited as patient  currently seems confused. Daughter arrived after initial workup and states he seems more confused than normal today. No known repeat injury in last few weeks. Due to this, will start infectious/AMS workup, get rectal temp, and add on CT head in addition to L-spine. Labs currently pending. Care to Dr. Particia Nearing. Will likely need admisison.    Pricilla Loveless, MD 05/14/16 [phone]

## 2016-05-14 NOTE — ED Notes (Signed)
Attempted report 

## 2016-05-14 NOTE — Consult Note (Signed)
Reason for Consult: ESRD Referring Physician: Dr. 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 Patient also with a 3x2cm vegetation on TTE + TEE performed in 03/2016 that was on the free wall of the RA likely from a prior catheter as the new exchanged catheter was not adherent to that vegetation. Myxoma also could not be r/o at the time with a rec of completing Abx. - Repeat echocardiogram and if that mass is still present + persistent fevers and bacteremia then highly suspicious for a thrombus that is seeded with the CNS. - Of note he has not always received a full dose of the Vancomycin, signing off early during dialysis bec of persistent lower back pain. - Continue Vancomycin for now --> CNS was sensitive to Vanc. 2 ESRD: MWF but only received dialysis for 1hr today before signing off bec of lower back pain.  - Plan on HD in the AM. 3 Hypertension: Resume anti-hypertensives and UF will help as well in the AM.  4. Anemia of ESRD: check iron panel but with ongoing inflammation and infection ferritin likely to be high as an acute phase reactant and probably will not respond to ESA. 5. Metabolic Bone Disease: will check a phos but likely will need tube feeds or TPN; looks severely malnourished.  Left Cimino with nice bruit and augments decent but vein rolls A LOT! Fistula placed 04/06/2016.  HPI: Jeffrey Walls is a 75 y.o. male with ESRD dialyzing @ [place] MWF but signs off frequently because of lower back pain. He was recently admitted for CNS bacteremia thought to be secondary to a dialysis catheter --> exchange + Vancomycin for 6 weeks and repeat echo as the prior TTE + TEE 04/02/2016 and 04/08/2016, respectively, demonstrated  RA 3x2 vegetation on the free wall not attached to the NEW LIJ catheter which was  placed on 04/06/2016. That vegetation may have been attached to the prior RIJ catheter. It is not certain that he received a full dose of Vancomycin @ [place] secondary to signing off early frequently bec of lower back pain. Vancomycin is typically given the last hour of dialysis. The daughter was bedside and stated that the reason he came to the ED is because of persistent severe lower back pain which has worsened over the past few weeks. He only received 1 hr of dialysis today before signing off early. He was noted to have a Tmax of 101 in the ED and CT suspicious for discitis in L4/L5 + paraspinal inflammation as well as a new T12 compression fracture.   Past Medical History  Diagnosis Date  . Renal disorder   . Hypertension   . Diabetes mellitus without complication ([place])   . Coronary artery disease   . Shortness of breath dyspnea   . ANCA-associated vasculitis ([place])   . Pneumocystis jiroveci pneumonia ([place])      Allergies:  Allergies  Allergen Reactions  . Ampicillin Rash    Ankle swelling  . Benadryl [Diphenhydramine Hcl] Itching    Rash    Past Surgical History  Procedure Laterality Date  . Av fistula placement Left 03/04/2016    Procedure: ARTERIOVENOUS (AV) FISTULA CREATION LEFT RADIOCEPHALIC;  Surgeon: Angelia Mould, MD;  Location: [place];  Service: Vascular;  Laterality: Left;  . Insertion of dialysis catheter N/A 04/06/2016    Procedure:  INSERTION OF DIALYSIS CATHETER;  Surgeon: Elam Dutch, MD;  Location: [place];  Service: Vascular;  Laterality: N/A;  . Tee without cardioversion N/A 04/08/2016    Procedure: TRANSESOPHAGEAL ECHOCARDIOGRAM (TEE);  Surgeon: Larey Dresser, MD;  Location: [place];  Service: Cardiovascular;  Laterality: N/A;    History reviewed. No pertinent family history.  Social History:  reports that he has never smoked. He does not have any smokeless tobacco history on file. He reports that he does not drink alcohol or use illicit  drugs.  ROS Pertinent items are noted in HPI.  Blood pressure 134/67, pulse 91, temperature 101 F (38.3 C), temperature source Rectal, resp. rate 17, height _0  (1.676 m), weight 50.803 kg (112 lb), SpO2 96 %. General appearance: mild distress, uncooperative and obtunded Head: Normocephalic, without obvious abnormality, atraumatic Eyes: negative Neck: no adenopathy, no carotid bruit, supple, symmetrical, trachea midline and thyroid not enlarged, symmetric, no tenderness/mass/nodules Back: difficult to assess as he was lethargic and not cooperative but no flank tenderness Resp: poor air movement Chest wall: no tenderness Extremities: extremities normal, atraumatic, no cyanosis or edema Pulses: 2+ and symmetric Skin: Skin color, texture, turgor normal. No rashes or lesions Lymph nodes: Cervical, supraclavicular, and axillary nodes normal. Neurologic: Mental status: lethargic  Access: left CImino with good thrill and somewhat decent augmentation but veins rolls ALOT  Results for orders placed or performed during the hospital encounter of 05/14/16 (from the past 48 hour(s))  Basic metabolic panel     Status: Abnormal   Collection Time: 05/14/16  3:51 PM  Result Value Ref Range   Sodium 135 135 - 145 mmol/L   Potassium 2.9 (L) 3.5 - 5.1 mmol/L   Chloride 97 (L) 101 - 111 mmol/L   CO2 28 22 - 32 mmol/L   Glucose, Bld 86 65 - 99 mg/dL   BUN 11 6 - 20 mg/dL   Creatinine, Ser 2.24 (H) 0.61 - 1.24 mg/dL   Calcium 7.8 (L) 8.9 - 10.3 mg/dL   GFR calc non Af Amer 27 (L) >60 mL/min   GFR calc Af Amer 31 (L) >60 mL/min    Comment: (NOTE) The eGFR has been calculated using the CKD EPI equation. This calculation has not been validated in all clinical situations. eGFR's persistently <60 mL/min signify possible Chronic Kidney Disease.    Anion gap 10 5 - 15  CBC with Differential     Status: Abnormal   Collection Time: 05/14/16  3:51 PM  Result Value Ref Range   WBC 8.1 4.0 - 10.5 K/uL    RBC 3.59 (L) 4.22 - 5.81 MIL/uL   Hemoglobin 10.1 (L) 13.0 - 17.0 g/dL   HCT 31.9 (L) 39.0 - 52.0 %   MCV 88.9 78.0 - 100.0 fL   MCH 28.1 26.0 - 34.0 pg   MCHC 31.7 30.0 - 36.0 g/dL   RDW 16.6 (H) 11.5 - 15.5 %   Platelets 174 150 - 400 K/uL   Neutrophils Relative % 86 %   Neutro Abs 7.0 1.7 - 7.7 K/uL   Lymphocytes Relative 7 %   Lymphs Abs 0.6 (L) 0.7 - 4.0 K/uL   Monocytes Relative 7 %   Monocytes Absolute 0.6 0.1 - 1.0 K/uL   Eosinophils Relative 0 %   Eosinophils Absolute 0.0 0.0 - 0.7 K/uL   Basophils Relative 0 %   Basophils Absolute 0.0 0.0 - 0.1 K/uL  I-Stat CG4 Lactic Acid, ED     Status: None  Collection Time: 05/14/16  6:25 PM  Result Value Ref Range   Lactic Acid, Venous 0.80 0.5 - 2.0 mmol/L    Ct Head Wo Contrast  05/14/2016  CLINICAL DATA:  Fall today. Altered mental status. Agitation/confusion. 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 EXAM: CT HEAD WITHOUT CONTRAST TECHNIQUE: Contiguous axial images were obtained from the base of the skull through the vertex without intravenous contrast. COMPARISON:  05/09/2016 FINDINGS: Small remote lacunar infarcts in the left lentiform nucleus. Otherwise, the brainstem, cerebellum, cerebral peduncles, thalami, basal ganglia, basilar cisterns, and ventricular system appear within normal limits. Periventricular white matter and corona radiata hypodensities favor chronic ischemic microvascular white matter disease. No intracranial hemorrhage, mass lesion, or acute CVA. IMPRESSION: 1. No acute intracranial findings. 2. Periventricular white matter and corona radiata hypodensities favor chronic ischemic microvascular white matter disease. 3. 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 Electronically Signed   By: Van Clines M.D.   On: 05/14/2016 16:51   Ct Lumbar Spine Wo Contrast  05/14/2016  CLINICAL DATA:  Fall on Tuesday. Pain in the back. This is worsened. EXAM: CT LUMBAR SPINE WITHOUT CONTRAST TECHNIQUE:  Multidetector CT imaging of the lumbar spine was performed without intravenous contrast administration. Multiplanar CT image reconstructions were also generated. COMPARISON:  05/09/2016 FINDINGS: Acute 15% superior endplate compression fracture at T12 with endplate sclerosis and some cortical irregularity. No significant bony retropulsion. This is new compared to the MRI from 04/05/2016. Abnormal erosive findings along the inferior endplate of L4 and superior endplate of L5. These lack the characteristic sclerotic margin for Schmorl's nodes, and moreover there is some sclerosis and interval flattening along the anterior superior endplate of L5 compared to the 04/05/2016 exam. These heighten my concern that the findings at L4-5 are probably due to discitis -osteomyelitis. There is also prominence of the paraspinal tissues at this level deep to the psoas muscles, probably inflammatory. There is also a suggestion of degenerative disc disease at this level potentially causing foraminal impingement. Strictly speaking I cannot exclude epidural abscess given the slight epidural prominence in this vicinity and concern for discitis. Along the inferior endplate of L3 there is a more typical appearing Schmorl' s node, with sclerotic margin. No vertebral subluxation is observed. The is a right twelfth rib fracture posteriorly on image 39/2 L1. Bridging spurring of the left sacroiliac joint. Aortoiliac atherosclerotic vascular disease. Bilateral pleural effusions. Bilateral perirenal stranding. Diffuse subcutaneous and mesenteric edema. IMPRESSION: 1. 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 2. There is also a new 15% superior endplate compression fracture T12 as well as a  nondisplaced fracture of the right T12 rib posteriorly. 3. Bilateral pleural effusions are at least moderate in size but only partially characterized on today' s exam. 4. Perirenal, subcutaneous, and mesenteric edema. Electronically Signed   By: Van Clines M.D.   On: 05/14/2016 17:10     Dwana Melena, MD 05/14/2016, 7:03 PM

## 2016-05-14 NOTE — H&P (Addendum)
History and Physical    Jeffrey Walls ZOX:[id number] DOB: 1941/12/02 DOA: 05/14/2016  PCP: Theressa StampsHADLEY,ALEXANDER, MD  Patient coming from: home  Chief Complaint: back pain / AMS  HPI: Jeffrey Dolindgar Kingbird is a 75 y.o. male with medical history significant of ESRD, HTN, Combined CHF, recently hospitalized [facility] for coag negative staph bacteremia, felt to be related to his hemodialysis catheter, which was apparently replaced, and patient was placed on IV vancomycin for 6 weeks, supposed to finish the treatment in couple of days. 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  In the ED patient is quite confused, cannot contribute to the story, I talked to his daughter and apparently he has been complaining of progressive back pain, to the point that he needed to cut for the hemodialysis due to unbearable back pain. In the ED, he was found to be febrile to 101 and with altered mental status. He is alert and open his eyes and able to follow simple commands however appears sleepy. He had a CT scan of the head without any acute findings, he had a CT scan of the L-spine with findings concerning for discitis and osteomyelitis at the L4-5 level, with significant bony destructive findings, and an epidural abscess cannot be excluded. TRH was asked for admission for sepsis/AMS.  Review of Systems: Unable to obtain review of system due to acute encephalopathy  Past Medical History  Diagnosis Date  . Renal disorder   . Hypertension   . Diabetes mellitus without complication (HCC)   . Coronary artery disease   . Shortness of breath dyspnea   . ANCA-associated vasculitis (HCC)   . Pneumocystis jiroveci pneumonia [facility](HCC)     Past Surgical History  Procedure Laterality Date  . Av fistula placement Left 03/04/2016    Procedure: ARTERIOVENOUS (AV) FISTULA CREATION LEFT RADIOCEPHALIC;  Surgeon:  [name], MD;  [facility];  Service: Vascular;  Laterality: Left;  . Insertion of dialysis catheter N/A 04/06/2016    Procedure: INSERTION OF DIALYSIS CATHETER;  Surgeon: Sherren Kernsharles E Fields, MD;  [facility];  Service: Vascular;  Laterality: N/A;  . Tee without cardioversion N/A 04/08/2016    Procedure: TRANSESOPHAGEAL ECHOCARDIOGRAM (TEE);  Surgeon: Laurey Moralealton S McLean, MD;  [facility];  Service: Cardiovascular;  Laterality: N/A;     reports that he has never smoked. He does not have any smokeless tobacco history on file. He reports that he does not drink alcohol or use illicit drugs.  Allergies  Allergen Reactions  . Ampicillin Rash    Ankle swelling  . Benadryl [Diphenhydramine Hcl] Itching    Rash    Unable to obtain family history due to acute encephalopathy  Prior to Admission medications   Medication Sig Start Date End Date Taking? Authorizing Provider  aspirin EC 81 MG tablet Take 81 mg by mouth daily.   Yes Historical Provider, MD  citalopram (CELEXA) 20 MG tablet Take 20 mg by mouth daily.   Yes Historical Provider, MD  lisinopril (PRINIVIL,ZESTRIL) 10 MG tablet Take 1 tablet by mouth daily. 05/13/16  Yes Historical Provider, MD  acetaminophen (TYLENOL) 500 MG tablet Take 2 tablets (1,000 mg total) by mouth 3 (three) times daily. 04/11/16   Shanker Levora DredgeM Ghimire, MD  ALPRAZolam Prudy Feeler(XANAX) 0.25 MG tablet Take 1 tablet (0.25 mg total) by mouth 3 (three) times daily as needed for  anxiety. 04/11/16   Shanker Levora Dredge, MD  Amino Acids-Protein Hydrolys (FEEDING SUPPLEMENT, PRO-STAT SUGAR FREE 64,) LIQD Take 30 mLs by mouth 2 (two) times daily. 04/11/16   Shanker Levora Dredge, MD  feeding supplement (BOOST / RESOURCE BREEZE) LIQD Take 1 Container by mouth 2 (two) times daily between meals. 04/11/16   Shanker Levora Dredge, MD  folic acid (FOLVITE) 1 MG tablet Take 1 mg by mouth daily. 08/30/15   Historical Provider, MD  insulin aspart (NOVOLOG) 100 UNIT/ML injection 0-9 Units,  Subcutaneous, 3 times daily with meals CBG < 70: implement hypoglycemia protocol CBG 70 - 120: 0 units CBG 121 - 150: 1 unit CBG 151 - 200: 2 units CBG 201 - 250: 3 units CBG 251 - 300: 5 units CBG 301 - 350: 7 units CBG 351 - 400: 9 units CBG > 400: call MD 04/11/16   Maretta Bees, MD  lisinopril (PRINIVIL,ZESTRIL) 20 MG tablet Take 0.5 tablets (10 mg total) by mouth daily. 04/11/16   Shanker Levora Dredge, MD  metoprolol succinate (TOPROL-XL) 25 MG 24 hr tablet Take 1 tablet (25 mg total) by mouth daily. 04/11/16   Shanker Levora Dredge, MD  oxyCODONE (OXY IR/ROXICODONE) 5 MG immediate release tablet Take 1-2 tablets (5-10 mg total) by mouth every 6 (six) hours as needed for severe pain. 04/11/16   Shanker Levora Dredge, MD  pantoprazole (PROTONIX) 20 MG tablet Take 20 mg by mouth daily. 08/18/15 08/17/16  Historical Provider, MD  Vancomycin (VANCOCIN) 750-5 MG/150ML-% SOLN Inject 150 mLs (750 mg total) into the vein every Monday, Wednesday, and Friday with hemodialysis. 04/11/16   Shanker Levora Dredge, MD  vitamin B-12 (CYANOCOBALAMIN) 1000 MCG tablet Place 1,000 mcg under the tongue daily.     Historical Provider, MD  zolpidem (AMBIEN) 5 MG tablet Take 1 tablet (5 mg total) by mouth at bedtime as needed. Sleep 04/11/16   Maretta Bees, MD    Physical Exam: Filed Vitals:   05/14/16 1704 05/14/16 1715 05/14/16 1730 05/14/16 1745  BP: 128/62 121/63 126/62 125/66  Pulse: 94 93 93 91  Temp:      TempSrc:      Resp: 17     SpO2: 92% 99% 96% 99%      Constitutional: pale appearing cachectic Caucasian male Filed Vitals:   05/14/16 1704 05/14/16 1715 05/14/16 1730 05/14/16 1745  BP: 128/62 121/63 126/62 125/66  Pulse: 94 93 93 91  Temp:      TempSrc:      Resp: 17     SpO2: 92% 99% 96% 99%   Eyes: PERRL, right pupil > left pupil (not new per daughter) ENMT: Mucous membranes are dry.  Respiratory: clear to auscultation bilaterally, no wheezing, no crackles. Normal respiratory effort. No  accessory muscle use.  Cardiovascular: Regular rate and rhythm, no murmurs / rubs / gallops. No extremity edema. 2+ pedal pulses.  Abdomen: no tenderness, no masses palpated. Bowel sounds positive.  Musculoskeletal: no clubbing / cyanosis. Skin: no rashes, lesions, ulcers. No induration Neurologic: moves all 4, does not follow commands consistently   Labs on Admission: I have personally reviewed following labs and imaging studies  CBC:  Recent Labs Lab 05/14/16 1551  WBC 8.1  NEUTROABS 7.0  HGB 10.1*  HCT 31.9*  MCV 88.9  PLT 174   Basic Metabolic Panel:  Recent Labs Lab 05/14/16 1551  NA 135  K 2.9*  CL 97*  CO2 28  GLUCOSE 86  BUN 11  CREATININE 2.24*  CALCIUM 7.8*   GFR: CrCl cannot be calculated (Unknown ideal weight.). Liver Function Tests: No results for input(s): AST, ALT, ALKPHOS, BILITOT, PROT, ALBUMIN in the last 168 hours. No results for input(s): LIPASE, AMYLASE in the last 168 hours. No results for input(s): AMMONIA in the last 168 hours. Coagulation Profile: No results for input(s): INR, PROTIME in the last 168 hours. Cardiac Enzymes: No results for input(s): CKTOTAL, CKMB, CKMBINDEX, TROPONINI in the last 168 hours. BNP (last 3 results) No results for input(s): PROBNP in the last 8760 hours. HbA1C: No results for input(s): HGBA1C in the last 72 hours. CBG: No results for input(s): GLUCAP in the last 168 hours. Lipid Profile: No results for input(s): CHOL, HDL, LDLCALC, TRIG, CHOLHDL, LDLDIRECT in the last 72 hours. Thyroid Function Tests: No results for input(s): TSH, T4TOTAL, FREET4, T3FREE, THYROIDAB in the last 72 hours. Anemia Panel: No results for input(s): VITAMINB12, FOLATE, FERRITIN, TIBC, IRON, RETICCTPCT in the last 72 hours. Urine analysis:    Component Value Date/Time   COLORURINE YELLOW 03/31/2016 0858   APPEARANCEUR CLOUDY* 03/31/2016 0858   LABSPEC 1.030 03/31/2016 0858   PHURINE 6.0 03/31/2016 0858   GLUCOSEU 250*  03/31/2016 0858   HGBUR MODERATE* 03/31/2016 0858   BILIRUBINUR MODERATE* 03/31/2016 0858   KETONESUR 15* 03/31/2016 0858   PROTEINUR >300* 03/31/2016 0858   NITRITE NEGATIVE 03/31/2016 0858   LEUKOCYTESUR NEGATIVE 03/31/2016 0858   Sepsis Labs: @LABRCNTIP (procalcitonin:4,lacticidven:4) )No results found for this or any previous visit (from the past 240 hour(s)).   Radiological Exams on Admission: Ct Head Wo Contrast  05/14/2016  CLINICAL DATA:  Fall today. Altered mental status. Agitation/confusion. 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 EXAM: CT HEAD WITHOUT CONTRAST TECHNIQUE: Contiguous axial images were obtained from the base of the skull through the vertex without intravenous contrast. COMPARISON:  05/09/2016 FINDINGS: Small remote lacunar infarcts in the left lentiform nucleus. Otherwise, the brainstem, cerebellum, cerebral peduncles, thalami, basal ganglia, basilar cisterns, and ventricular system appear within normal limits. Periventricular white matter and corona radiata hypodensities favor chronic ischemic microvascular white matter disease. No intracranial hemorrhage, mass lesion, or acute CVA. IMPRESSION: 1. No acute intracranial findings. 2. Periventricular white matter and corona radiata hypodensities favor chronic ischemic microvascular white matter disease. 3. 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 Electronically Signed   By: Gaylyn Rong M.D.   On: 05/14/2016 16:51   Ct Lumbar Spine Wo Contrast  05/14/2016  CLINICAL DATA:  Fall on Tuesday. Pain in the back. This is worsened. EXAM: CT LUMBAR SPINE WITHOUT CONTRAST TECHNIQUE: Multidetector CT imaging of the lumbar spine was performed without intravenous contrast administration. Multiplanar CT image reconstructions were also generated. COMPARISON:  05/09/2016 FINDINGS: Acute 15% superior endplate compression fracture at T12 with endplate sclerosis and some cortical irregularity. No significant bony  retropulsion. This is new compared to the MRI from 04/05/2016. Abnormal erosive findings along the inferior endplate of L4 and superior endplate of L5. These lack the characteristic sclerotic margin for Schmorl's nodes, and moreover there is some sclerosis and interval flattening along the anterior superior endplate of L5 compared to the 04/05/2016 exam. These heighten my concern that the findings at L4-5 are probably due to discitis -osteomyelitis. There is also prominence of the paraspinal tissues at this level deep to the psoas muscles, probably inflammatory. There is also a suggestion of degenerative disc disease at this level potentially causing foraminal impingement. Strictly speaking I cannot exclude epidural abscess given the slight epidural prominence in this vicinity and  concern for discitis. Along the inferior endplate of L3 there is a more typical appearing Schmorl' s node, with sclerotic margin. No vertebral subluxation is observed. The is a right twelfth rib fracture posteriorly on image 39/2 L1. Bridging spurring of the left sacroiliac joint. Aortoiliac atherosclerotic vascular disease. Bilateral pleural effusions. Bilateral perirenal stranding. Diffuse subcutaneous and mesenteric edema. IMPRESSION: 1. 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 2. There is also a new 15% superior endplate compression fracture T12 as well as a nondisplaced fracture of the right T12 rib posteriorly. 3. Bilateral pleural effusions are at least moderate in size but only partially characterized on today' s exam. 4. Perirenal, subcutaneous, and mesenteric edema. Electronically Signed   By: Gaylyn Rong M.D.   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 Currently he is on room air and does not appear fluid overloaded  - His blood pressure is stable, hold additional fluids   HTN  - hold antihypertensive in the setting of sepsis  DM - SSI  Back pain / anxiety  - minimize narcotics as able since he is encephalopathic  Severe protein calorie malnutrition   DVT prophylaxis: SCD  Code Status: Full  Family Communication: d/w daughter bedside Disposition Plan: admit to tele Consults called: ID, Dr. Algis Liming  Admission status: inpatient     Pamella Pert, MD [facility] Pager [phone]  If 7PM-7AM, please contact night-coverage [email or web address] Password [facility]  05/14/2016, 5:54 PM

## 2016-05-14 NOTE — Consult Note (Signed)
Reason for Consult: Discitis L4-5 Referring Physician: [facility]  Jeffrey Walls is an 75 y.o. male.  HPI: Patient is a 75 year old gentleman with multiple medical problems to include end-stage renal disease who has diagnosed with discitis back the end of April was placed on IV antibiotics and apparently is been maintained on IV vancomycin. Patient comes back to the ER today febrile confused and a workup with a CT scan showing a progressive discitis. He does not have any focal deficits. Her report or per my exam however review of systems is unable to be obtained from patient's unable to elucidate any subjective symptoms.  Past Medical History  Diagnosis Date  . Renal disorder   . Hypertension   . Diabetes mellitus without complication ([place])   . Coronary artery disease   . Shortness of breath dyspnea   . ANCA-associated vasculitis ([place])   . Pneumocystis jiroveci pneumonia [facility])     Past Surgical History  Procedure Laterality Date  . Av fistula placement Left 03/04/2016    Procedure: ARTERIOVENOUS (AV) FISTULA CREATION LEFT RADIOCEPHALIC;  Surgeon: Angelia Mould, MD;  Location: [place];  Service: Vascular;  Laterality: Left;  . Insertion of dialysis catheter N/A 04/06/2016    Procedure: INSERTION OF DIALYSIS CATHETER;  Surgeon: Elam Dutch, MD;  Location: [place];  Service: Vascular;  Laterality: N/A;  . Tee without cardioversion N/A 04/08/2016    Procedure: TRANSESOPHAGEAL ECHOCARDIOGRAM (TEE);  Surgeon: Larey Dresser, MD;  Location: [place];  Service: Cardiovascular;  Laterality: N/A;    History reviewed. No pertinent family history.  Social History:  reports that he has never smoked. He does not have any smokeless tobacco history on file. He reports that he does not drink alcohol or use illicit drugs.  Allergies:  Allergies  Allergen Reactions  . Ampicillin Rash    Ankle swelling  . Benadryl [Diphenhydramine Hcl] Itching    Rash    Medications: I have  reviewed the patient's current medications.  Results for orders placed or performed during the hospital encounter of 05/14/16 (from the past 48 hour(s))  Basic metabolic panel     Status: Abnormal   Collection Time: 05/14/16  3:51 PM  Result Value Ref Range   Sodium 135 135 - 145 mmol/L   Potassium 2.9 (L) 3.5 - 5.1 mmol/L   Chloride 97 (L) 101 - 111 mmol/L   CO2 28 22 - 32 mmol/L   Glucose, Bld 86 65 - 99 mg/dL   BUN 11 6 - 20 mg/dL   Creatinine, Ser 2.24 (H) 0.61 - 1.24 mg/dL   Calcium 7.8 (L) 8.9 - 10.3 mg/dL   GFR calc non Af Amer 27 (L) >60 mL/min   GFR calc Af Amer 31 (L) >60 mL/min    Comment: (NOTE) The eGFR has been calculated using the CKD EPI equation. This calculation has not been validated in all clinical situations. eGFR's persistently <60 mL/min signify possible Chronic Kidney Disease.    Anion gap 10 5 - 15  CBC with Differential     Status: Abnormal   Collection Time: 05/14/16  3:51 PM  Result Value Ref Range   WBC 8.1 4.0 - 10.5 K/uL   RBC 3.59 (L) 4.22 - 5.81 MIL/uL   Hemoglobin 10.1 (L) 13.0 - 17.0 g/dL   HCT 31.9 (L) 39.0 - 52.0 %   MCV 88.9 78.0 - 100.0 fL   MCH 28.1 26.0 - 34.0 pg   MCHC 31.7 30.0 - 36.0 g/dL   RDW  16.6 (H) 11.5 - 15.5 %   Platelets 174 150 - 400 K/uL   Neutrophils Relative % 86 %   Neutro Abs 7.0 1.7 - 7.7 K/uL   Lymphocytes Relative 7 %   Lymphs Abs 0.6 (L) 0.7 - 4.0 K/uL   Monocytes Relative 7 %   Monocytes Absolute 0.6 0.1 - 1.0 K/uL   Eosinophils Relative 0 %   Eosinophils Absolute 0.0 0.0 - 0.7 K/uL   Basophils Relative 0 %   Basophils Absolute 0.0 0.0 - 0.1 K/uL  I-Stat CG4 Lactic Acid, ED     Status: None   Collection Time: 05/14/16  6:25 PM  Result Value Ref Range   Lactic Acid, Venous 0.80 0.5 - 2.0 mmol/L    Ct Head Wo Contrast  05/14/2016  CLINICAL DATA:  Fall today. Altered mental status. Agitation/confusion. 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 EXAM: CT HEAD WITHOUT CONTRAST TECHNIQUE: Contiguous axial images were  obtained from the base of the skull through the vertex without intravenous contrast. COMPARISON:  05/09/2016 FINDINGS: Small remote lacunar infarcts in the left lentiform nucleus. Otherwise, the brainstem, cerebellum, cerebral peduncles, thalami, basal ganglia, basilar cisterns, and ventricular system appear within normal limits. Periventricular white matter and corona radiata hypodensities favor chronic ischemic microvascular white matter disease. No intracranial hemorrhage, mass lesion, or acute CVA. IMPRESSION: 1. No acute intracranial findings. 2. Periventricular white matter and corona radiata hypodensities favor chronic ischemic microvascular white matter disease. 3. 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 Electronically Signed   By: Van Clines M.D.   On: 05/14/2016 16:51   Ct Lumbar Spine Wo Contrast  05/14/2016  CLINICAL DATA:  Fall on Tuesday. Pain in the back. This is worsened. EXAM: CT LUMBAR SPINE WITHOUT CONTRAST TECHNIQUE: Multidetector CT imaging of the lumbar spine was performed without intravenous contrast administration. Multiplanar CT image reconstructions were also generated. COMPARISON:  05/09/2016 FINDINGS: Acute 15% superior endplate compression fracture at T12 with endplate sclerosis and some cortical irregularity. No significant bony retropulsion. This is new compared to the MRI from 04/05/2016. Abnormal erosive findings along the inferior endplate of L4 and superior endplate of L5. These lack the characteristic sclerotic margin for Schmorl's nodes, and moreover there is some sclerosis and interval flattening along the anterior superior endplate of L5 compared to the 04/05/2016 exam. These heighten my concern that the findings at L4-5 are probably due to discitis -osteomyelitis. There is also prominence of the paraspinal tissues at this level deep to the psoas muscles, probably inflammatory. There is also a suggestion of degenerative disc disease at  this level potentially causing foraminal impingement. Strictly speaking I cannot exclude epidural abscess given the slight epidural prominence in this vicinity and concern for discitis. Along the inferior endplate of L3 there is a more typical appearing Schmorl' s node, with sclerotic margin. No vertebral subluxation is observed. The is a right twelfth rib fracture posteriorly on image 39/2 L1. Bridging spurring of the left sacroiliac joint. Aortoiliac atherosclerotic vascular disease. Bilateral pleural effusions. Bilateral perirenal stranding. Diffuse subcutaneous and mesenteric edema. IMPRESSION: 1. 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 2. There is also a new 15% superior endplate compression fracture T12 as well as a nondisplaced fracture of the right T12 rib posteriorly. 3. Bilateral pleural effusions are at least moderate in size  but only partially characterized on today' s exam. 4. Perirenal, subcutaneous, and mesenteric edema. Electronically Signed   By: Van Clines M.D.   On: 05/14/2016 17:10    Review of Systems  Unable to perform ROS  Blood pressure 134/67, pulse 91, temperature 101 F (38.3 C), temperature source Rectal, resp. rate 17, height 5' 6"  (1.676 m), weight 50.803 kg (112 lb), SpO2 96 %. Physical Exam  Neurological: He is alert. He is disoriented. GCS eye subscore is 4. GCS verbal subscore is 5. GCS motor subscore is 6.  Patient is awake but very confused and encephalopathic. Minimally compliant with exam. Seems to move all extremities with equal strength. Does not appear to have any focal deficits. Pupils are equal.    Assessment/Plan: 75 year old gentleman with multiple medical comorbidities and known discitis unresponsive  to treatment. Due to patient's significant medical comorbidities and his lack of focal neurologic deficits I would continue to recommend maximal medical treatment possibly inclusive of CT-guided biopsy. Would proceed forward with MRI scan of his lumbar spine if the patient can tolerate the procedure. Recommend ID consult recommended full medical workup for encephalopathy.  Hyder Deman P 05/14/2016, 7:29 PM

## 2016-05-14 NOTE — Progress Notes (Signed)
Pharmacy Antibiotic Note  Jeffrey Walls is a 75 y.o. male admitted on 05/14/2016 with back pain s/p fall last week. Pt is ESRD- MWF, he completed half of his dialysis session today, which was cut short due to untolerable back pain. CT shows possible discitis osteomyelitis at L4-L5 with edema, concerning for possible epidural abscess.  Plan: -Vancomycin 1 g IV x1 now then 500 mg IV qHD -F/u dialysis plans and tolerance -F/u cultures and ID recommendations -VR as needed  Height: 5\' 6"  (167.6 cm) (estimated per daughter) Weight: 112 lb (50.803 kg) IBW/kg (Calculated) : 63.8  Temp (24hrs), Avg:99.9 F (37.7 C), Min:98.8 F (37.1 C), Max:101 F (38.3 C)   Recent Labs Lab 05/14/16 1551 05/14/16 1825  WBC 8.1  --   CREATININE 2.24*  --   LATICACIDVEN  --  0.80    Estimated Creatinine Clearance: 20.8 mL/min (by C-G formula based on Cr of 2.24).    Allergies  Allergen Reactions  . Ampicillin Rash    Ankle swelling  . Benadryl [Diphenhydramine Hcl] Itching    Rash    Antimicrobials this admission: 5/31 vancomycin >>   Dose adjustments this admission: 5/31 blood cx: 5/31 urine cx:   Microbiology results: NA  Thank you for allowing pharmacy to be a part of this patient's care.  Jeffrey Walls, Jeffrey Walls 05/14/2016 6:59 PM

## 2016-05-14 NOTE — ED Notes (Signed)
Pt is refusing to get undressed and into a gown

## 2016-05-15 DIAGNOSIS — G9340 Encephalopathy, unspecified: Secondary | ICD-10-CM

## 2016-05-15 DIAGNOSIS — G061 Intraspinal abscess and granuloma: Secondary | ICD-10-CM

## 2016-05-15 DIAGNOSIS — M4646 Discitis, unspecified, lumbar region: Secondary | ICD-10-CM

## 2016-05-15 DIAGNOSIS — R7881 Bacteremia: Secondary | ICD-10-CM

## 2016-05-15 DIAGNOSIS — B961 Klebsiella pneumoniae [K. pneumoniae] as the cause of diseases classified elsewhere: Secondary | ICD-10-CM

## 2016-05-15 LAB — RENAL FUNCTION PANEL
ALBUMIN: 2 g/dL — AB (ref 3.5–5.0)
Anion gap: 9 (ref 5–15)
BUN: 21 mg/dL — AB (ref 6–20)
CALCIUM: 7.7 mg/dL — AB (ref 8.9–10.3)
CO2: 27 mmol/L (ref 22–32)
Chloride: 97 mmol/L — ABNORMAL LOW (ref 101–111)
Creatinine, Ser: 2.89 mg/dL — ABNORMAL HIGH (ref 0.61–1.24)
GFR calc Af Amer: 23 mL/min — ABNORMAL LOW (ref >60)
GFR calc non Af Amer: 20 mL/min — ABNORMAL LOW (ref >60)
GLUCOSE: 87 mg/dL (ref 65–99)
PHOSPHORUS: 3.7 mg/dL (ref 2.5–4.6)
POTASSIUM: 2.8 mmol/L — AB (ref 3.5–5.1)
SODIUM: 133 mmol/L — AB (ref 135–145)

## 2016-05-15 LAB — BLOOD CULTURE ID PANEL (REFLEXED)
ACINETOBACTER BAUMANNII: NOT DETECTED
CANDIDA GLABRATA: NOT DETECTED
CANDIDA PARAPSILOSIS: NOT DETECTED
CANDIDA TROPICALIS: NOT DETECTED
Candida albicans: NOT DETECTED
Candida krusei: NOT DETECTED
Carbapenem resistance: NOT DETECTED
ENTEROBACTER CLOACAE COMPLEX: NOT DETECTED
ENTEROCOCCUS SPECIES: NOT DETECTED
Enterobacteriaceae species: DETECTED — AB
Escherichia coli: NOT DETECTED
Haemophilus influenzae: NOT DETECTED
Klebsiella oxytoca: NOT DETECTED
Klebsiella pneumoniae: DETECTED — AB
Listeria monocytogenes: NOT DETECTED
Methicillin resistance: NOT DETECTED
Neisseria meningitidis: NOT DETECTED
PROTEUS SPECIES: NOT DETECTED
Pseudomonas aeruginosa: NOT DETECTED
STAPHYLOCOCCUS AUREUS BCID: NOT DETECTED
STREPTOCOCCUS PYOGENES: NOT DETECTED
Serratia marcescens: NOT DETECTED
Staphylococcus species: NOT DETECTED
Streptococcus agalactiae: NOT DETECTED
Streptococcus pneumoniae: NOT DETECTED
Streptococcus species: NOT DETECTED
VANCOMYCIN RESISTANCE: NOT DETECTED

## 2016-05-15 LAB — CBC WITH DIFFERENTIAL/PLATELET
BASOS PCT: 0 %
Basophils Absolute: 0 10*3/uL (ref 0.0–0.1)
EOS ABS: 0 10*3/uL (ref 0.0–0.7)
EOS PCT: 0 %
HCT: 26.8 % — ABNORMAL LOW (ref 39.0–52.0)
Hemoglobin: 8.5 g/dL — ABNORMAL LOW (ref 13.0–17.0)
LYMPHS PCT: 12 %
Lymphs Abs: 1 10*3/uL (ref 0.7–4.0)
MCH: 28.3 pg (ref 26.0–34.0)
MCHC: 31.7 g/dL (ref 30.0–36.0)
MCV: 89.3 fL (ref 78.0–100.0)
MONO ABS: 0.9 10*3/uL (ref 0.1–1.0)
MONOS PCT: 11 %
NEUTROS ABS: 6.2 10*3/uL (ref 1.7–7.7)
NEUTROS PCT: 77 %
PLATELETS: 146 10*3/uL — AB (ref 150–400)
RBC: 3 MIL/uL — ABNORMAL LOW (ref 4.22–5.81)
RDW: 16.6 % — AB (ref 11.5–15.5)
WBC: 8.1 10*3/uL (ref 4.0–10.5)

## 2016-05-15 LAB — URINALYSIS, ROUTINE W REFLEX MICROSCOPIC
GLUCOSE, UA: 100 mg/dL — AB
HGB URINE DIPSTICK: NEGATIVE
KETONES UR: 15 mg/dL — AB
Nitrite: POSITIVE — AB
Specific Gravity, Urine: 1.031 — ABNORMAL HIGH (ref 1.005–1.030)
pH: 6 (ref 5.0–8.0)

## 2016-05-15 LAB — GLUCOSE, CAPILLARY
GLUCOSE-CAPILLARY: 93 mg/dL (ref 65–99)
Glucose-Capillary: 75 mg/dL (ref 65–99)
Glucose-Capillary: 82 mg/dL (ref 65–99)

## 2016-05-15 LAB — URINE MICROSCOPIC-ADD ON

## 2016-05-15 MED ORDER — OXYCODONE HCL 5 MG PO TABS
5.0000 mg | ORAL_TABLET | Freq: Once | ORAL | Status: AC
  Administered 2016-05-15: 5 mg via ORAL

## 2016-05-15 MED ORDER — LIDOCAINE-PRILOCAINE 2.5-2.5 % EX CREA: 1.0000 "application " | TOPICAL_CREAM | CUTANEOUS | Status: DC | PRN

## 2016-05-15 MED ORDER — SODIUM CHLORIDE 0.9 % IV SOLN: 100.0000 mL | INTRAVENOUS | Status: DC | PRN

## 2016-05-15 MED ORDER — METOPROLOL SUCCINATE ER 25 MG PO TB24
25.0000 mg | ORAL_TABLET | Freq: Every day | ORAL | Status: DC
  Administered 2016-05-16 – 2016-05-18 (×4): 25 mg via ORAL
  Filled 2016-05-15 (×3): qty 1

## 2016-05-15 MED ORDER — ASPIRIN EC 81 MG PO TBEC
81.0000 mg | DELAYED_RELEASE_TABLET | Freq: Every day | ORAL | Status: DC
  Administered 2016-05-15 – 2016-05-23 (×10): 81 mg via ORAL
  Filled 2016-05-15 (×9): qty 1

## 2016-05-15 MED ORDER — ALTEPLASE 2 MG IJ SOLR: 2.0000 mg | Freq: Once | INTRAMUSCULAR | Status: DC | PRN

## 2016-05-15 MED ORDER — PANTOPRAZOLE SODIUM 20 MG PO TBEC
20.0000 mg | DELAYED_RELEASE_TABLET | Freq: Every day | ORAL | Status: DC
  Administered 2016-05-15 – 2016-05-23 (×10): 20 mg via ORAL
  Filled 2016-05-15 (×9): qty 1

## 2016-05-15 MED ORDER — PENTAFLUOROPROP-TETRAFLUOROETH EX AERO: 1.0000 "application " | INHALATION_SPRAY | CUTANEOUS | Status: DC | PRN

## 2016-05-15 MED ORDER — LIDOCAINE HCL (PF) 1 % IJ SOLN: 5.0000 mL | INTRAMUSCULAR | Status: DC | PRN

## 2016-05-15 MED ORDER — LISINOPRIL 10 MG PO TABS
10.0000 mg | ORAL_TABLET | Freq: Every day | ORAL | Status: DC
  Administered 2016-05-16 – 2016-05-23 (×9): 10 mg via ORAL
  Filled 2016-05-15 (×8): qty 1

## 2016-05-15 MED ORDER — ALPRAZOLAM 0.25 MG PO TABS
0.2500 mg | ORAL_TABLET | Freq: Two times a day (BID) | ORAL | Status: DC | PRN
  Administered 2016-05-17 – 2016-05-21 (×5): 0.25 mg via ORAL
  Filled 2016-05-15 (×5): qty 1

## 2016-05-15 MED ORDER — HEPARIN SODIUM (PORCINE) 1000 UNIT/ML DIALYSIS: 1000.0000 [IU] | INTRAMUSCULAR | Status: DC | PRN

## 2016-05-15 MED ORDER — FOLIC ACID 1 MG PO TABS
1.0000 mg | ORAL_TABLET | Freq: Every day | ORAL | Status: DC
  Administered 2016-05-15 – 2016-05-23 (×10): 1 mg via ORAL
  Filled 2016-05-15 (×9): qty 1

## 2016-05-15 MED ORDER — BOOST / RESOURCE BREEZE PO LIQD
1.0000 | Freq: Three times a day (TID) | ORAL | Status: DC
  Administered 2016-05-17 – 2016-05-23 (×9): 1 via ORAL

## 2016-05-15 MED ORDER — ALPRAZOLAM 0.25 MG PO TABS: 0.2500 mg | ORAL_TABLET | Freq: Three times a day (TID) | ORAL | Status: DC | PRN

## 2016-05-15 MED ORDER — CITALOPRAM HYDROBROMIDE 20 MG PO TABS
20.0000 mg | ORAL_TABLET | Freq: Every day | ORAL | Status: DC
  Administered 2016-05-15 – 2016-05-23 (×10): 20 mg via ORAL
  Filled 2016-05-15 (×9): qty 1

## 2016-05-15 NOTE — Consult Note (Signed)
Chief Complaint: Patient was seen in consultation today for Lumbar 4 disc aspiration Chief Complaint  Patient presents with  . Back Pain   at the request of Dr Donalee CitrinGary Cram  Referring Physician(s): Dr Donalee CitrinGary Cram  Supervising Physician: Gilmer MorWagner, Jaime  Patient Status: In-pt   History of Present Illness: Jeffrey Walls Billing is a 75 y.o. 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 2. Infectious or inflammatory material extends posteriorly from the disc space into the epidural space with superior extension 2.5 cm from the inferior endplate of L4 within the left lateral recess. 3. Contrast could not be given due to renal insufficiency and a GFR of 28. 4. Mild central and bilateral foraminal stenosis at L3-4. 5. Moderate left and mild right foraminal stenosis at L4-5. 6. Mild subarticular narrowing bilaterally at L5-S1 secondary to moderate facet hypertrophy and spurring.  Request now for Lumbar 4 disc aspiration per Dr Wynetta Emeryram Dr Loreta AveWagner reviewing imaging Scheduled tentatively for 6/2 in IR  Past Medical History  Diagnosis Date  . Renal disorder   . Hypertension   . Diabetes mellitus without complication (HCC)   . Coronary artery disease   . Shortness of breath dyspnea   . ANCA-associated vasculitis (HCC)   . Pneumocystis jiroveci pneumonia [facility](HCC)     Past Surgical History  Procedure Laterality Date  . Av fistula placement Left 03/04/2016    Procedure: ARTERIOVENOUS (AV) FISTULA CREATION LEFT RADIOCEPHALIC;  Surgeon: [name], MD;  [facility];  Service: Vascular;  Laterality: Left;  . Insertion of dialysis catheter N/A 04/06/2016    Procedure: INSERTION OF DIALYSIS CATHETER;  Surgeon: Sherren Kernsharles E Fields, MD;  [facility];  Service: Vascular;  Laterality: N/A;  . Tee without  cardioversion N/A 04/08/2016    Procedure: TRANSESOPHAGEAL ECHOCARDIOGRAM (TEE);  Surgeon: Laurey Moralealton S McLean, MD;  [facility];  Service: Cardiovascular;  Laterality: N/A;    Allergies: Ampicillin and Benadryl  Medications: Prior to Admission medications   Medication Sig Start Date End Date Taking? Authorizing Provider  acetaminophen (TYLENOL) 500 MG tablet Take 2 tablets (1,000 mg total) by mouth 3 (three) times daily. 04/11/16  Yes Shanker Levora DredgeM Ghimire, MD  ALPRAZolam Prudy Feeler(XANAX) 0.25 MG tablet Take 1 tablet (0.25 mg total) by mouth 3 (three) times daily as needed for anxiety. 04/11/16  Yes Shanker Levora DredgeM Ghimire, MD  Amino Acids-Protein Hydrolys (FEEDING SUPPLEMENT, PRO-STAT SUGAR FREE 64,) LIQD Take 30 mLs by mouth 2 (two) times daily. 04/11/16  Yes Shanker Levora DredgeM Ghimire, MD  aspirin EC 81 MG tablet Take 81 mg by mouth daily.   Yes Historical Provider, MD  citalopram (CELEXA) 20 MG tablet Take 20 mg by mouth daily.   Yes Historical Provider, MD  feeding supplement (BOOST / RESOURCE BREEZE) LIQD Take 1 Container by mouth 2 (two) times daily between meals. 04/11/16  Yes Shanker Levora DredgeM Ghimire, MD  folic acid (FOLVITE) 1 MG tablet Take 1 mg by mouth daily. 08/30/15  Yes Historical Provider, MD  lisinopril (PRINIVIL,ZESTRIL) 10 MG tablet Take 1 tablet by mouth daily. 05/13/16  Yes Historical Provider, MD  loratadine (CLARITIN) 10 MG tablet Take 10 mg by mouth daily.   Yes Historical Provider, MD  metoprolol succinate (TOPROL-XL) 25 MG 24 hr tablet Take 1 tablet (25 mg total) by mouth daily. 04/11/16  Yes Shanker Levora DredgeM Ghimire,  MD  oxyCODONE (OXY IR/ROXICODONE) 5 MG immediate release tablet Take 1-2 tablets (5-10 mg total) by mouth every 6 (six) hours as needed for severe pain. 04/11/16  Yes Shanker Levora Dredge, MD  pantoprazole (PROTONIX) 20 MG tablet Take 20 mg by mouth daily. 08/18/15 08/17/16 Yes Historical Provider, MD  insulin aspart (NOVOLOG) 100 UNIT/ML injection 0-9 Units, Subcutaneous, 3 times daily with meals CBG  < 70: implement hypoglycemia protocol CBG 70 - 120: 0 units CBG 121 - 150: 1 unit CBG 151 - 200: 2 units CBG 201 - 250: 3 units CBG 251 - 300: 5 units CBG 301 - 350: 7 units CBG 351 - 400: 9 units CBG > 400: call MD Patient not taking: Reported on 05/14/2016 04/11/16   Shanker Levora Dredge, MD  lisinopril (PRINIVIL,ZESTRIL) 20 MG tablet Take 0.5 tablets (10 mg total) by mouth daily. 04/11/16   Shanker Levora Dredge, MD  Vancomycin (VANCOCIN) 750-5 MG/150ML-% SOLN Inject 150 mLs (750 mg total) into the vein every Monday, Wednesday, and Friday with hemodialysis. 04/11/16   Shanker Levora Dredge, MD  zolpidem (AMBIEN) 5 MG tablet Take 1 tablet (5 mg total) by mouth at bedtime as needed. Sleep 04/11/16   Maretta Bees, MD     History reviewed. No pertinent family history.  Social History   Social History  . Marital Status: Divorced    Spouse Name: N/A  . Number of Children: N/A  . Years of Education: N/A   Social History Main Topics  . Smoking status: Never Smoker   . Smokeless tobacco: None  . Alcohol Use: No  . Drug Use: No  . Sexual Activity: Not Asked   Other Topics Concern  . None   Social History Narrative     Review of Systems: A 12 point ROS discussed and pertinent positives are indicated in the HPI above.  All other systems are negative.  Review of Systems  Constitutional: Positive for fever, activity change and fatigue.  Respiratory: Negative for cough and shortness of breath.   Cardiovascular: Negative for chest pain.  Musculoskeletal: Positive for back pain and gait problem.  Neurological: Positive for weakness.  Psychiatric/Behavioral: Negative for behavioral problems and confusion.    Vital Signs: BP 139/71 mmHg  Pulse 92  Temp(Src) 98.4 F (36.9 C) (Oral)  Resp 17  Ht  (1.676 m)  Wt 120 lb 2.4 oz (54.5 kg)  BMI 19.40 kg/m2  SpO2 93%  Physical Exam  Cardiovascular: Normal rate and regular rhythm.   Pulmonary/Chest: Effort normal and breath sounds  normal.  Abdominal: Soft.  Musculoskeletal: Normal range of motion.  Low back pain---severe  Neurological: He is alert.  Skin: Skin is warm and dry.  Psychiatric: He has a normal mood and affect. His behavior is normal.  Nursing note and vitals reviewed.   Mallampati Score:  MD Evaluation Airway: WNL Heart: WNL Abdomen: WNL Chest/ Lungs: WNL ASA  Classification: 3 Mallampati/Airway Score: One  Imaging: Ct Head Wo Contrast  05/14/2016  CLINICAL DATA:  Fall today. Altered mental status. Agitation/confusion. 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 EXAM: CT HEAD WITHOUT CONTRAST TECHNIQUE: Contiguous axial images were obtained from the base of the skull through the vertex without intravenous contrast. COMPARISON:  05/09/2016 FINDINGS: Small remote lacunar infarcts in the left lentiform nucleus. Otherwise, the brainstem, cerebellum, cerebral peduncles, thalami, basal ganglia, basilar cisterns, and ventricular system appear within normal limits. Periventricular white matter and corona radiata hypodensities favor chronic ischemic microvascular white matter disease. No intracranial hemorrhage, mass lesion,  or acute CVA. IMPRESSION: 1. No acute intracranial findings. 2. Periventricular white matter and corona radiata hypodensities favor chronic ischemic microvascular white matter disease. 3. 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 Electronically Signed   By: Gaylyn Rong M.D.   On: 05/14/2016 16:51   Ct Lumbar Spine Wo Contrast  05/14/2016  CLINICAL DATA:  Fall on Tuesday. Pain in the back. This is worsened. EXAM: CT LUMBAR SPINE WITHOUT CONTRAST TECHNIQUE: Multidetector CT imaging of the lumbar spine was performed without intravenous contrast administration. Multiplanar CT image reconstructions were also generated. COMPARISON:  05/09/2016 FINDINGS: Acute 15% superior endplate compression fracture at T12 with endplate sclerosis and some cortical irregularity. No significant  bony retropulsion. This is new compared to the MRI from 04/05/2016. Abnormal erosive findings along the inferior endplate of L4 and superior endplate of L5. These lack the characteristic sclerotic margin for Schmorl's nodes, and moreover there is some sclerosis and interval flattening along the anterior superior endplate of L5 compared to the 04/05/2016 exam. These heighten my concern that the findings at L4-5 are probably due to discitis -osteomyelitis. There is also prominence of the paraspinal tissues at this level deep to the psoas muscles, probably inflammatory. There is also a suggestion of degenerative disc disease at this level potentially causing foraminal impingement. Strictly speaking I cannot exclude epidural abscess given the slight epidural prominence in this vicinity and concern for discitis. Along the inferior endplate of L3 there is a more typical appearing Schmorl' s node, with sclerotic margin. No vertebral subluxation is observed. The is a right twelfth rib fracture posteriorly on image 39/2 L1. Bridging spurring of the left sacroiliac joint. Aortoiliac atherosclerotic vascular disease. Bilateral pleural effusions. Bilateral perirenal stranding. Diffuse subcutaneous and mesenteric edema. IMPRESSION: 1. 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 2. There is also a new 15% superior endplate compression fracture T12 as well as a nondisplaced fracture of the right T12 rib posteriorly. 3. Bilateral pleural effusions are at least moderate in size but only partially characterized on today' s exam. 4. Perirenal, subcutaneous, and mesenteric edema. Electronically Signed   By: Gaylyn Rong M.D.   On: 05/14/2016 17:10   Mr Lumbar Spine Wo  Contrast  05/14/2016  CLINICAL DATA:  Discitis on treatment for 1 month. Increased confusion and fever. Abnormal CT scan. EXAM: MRI LUMBAR SPINE WITHOUT CONTRAST TECHNIQUE: Multiplanar, multisequence MR imaging of the lumbar spine was performed. No intravenous contrast was administered. COMPARISON:  CT of the lumbar spine 05/14/2016. MRI of the lumbar spine 04/05/2016. FINDINGS: Segmentation: 5 non rib-bearing lumbar type vertebral bodies are present. Alignment: AP alignment is anatomic. Leftward curvature of the lumbar spine is centered at L4-5. Vertebrae: A superior endplate fracture at T12 is again seen, incompletely healed. Diffuse edematous changes are present at L4-5. Conus medullaris: Extends to the T12-L1 level and appears normal. Paraspinal and other soft tissues: Edematous paraspinous changes are present at L4-5 with extension into the psoas musculature. There is no discrete abscess. A 10 mm cyst is present at the lower pole of the left kidney. No other focal solid organ lesions are present. Disc levels: The disc levels at L3-4 above are normal. L3-4: A broad-based disc protrusion is present. Moderate facet hypertrophy is noted. This results in mild central and bilateral foraminal stenosis. L4-5:  There is further destruction of endplates. Abnormal signal is present within the disc space and extending into the epidural space on the left. Disc fragment or epidural abscess extends superiorly along the posterior margin of the left L4 vertebral body 2.5 cm from the inferior endplate of L4. Moderate left subarticular stenosis is present. Moderate left and mild right foraminal narrowing is present. L5-S1: Moderate facet hypertrophy is present bilaterally. There is mild subarticular narrowing on both sides. IMPRESSION: 1. 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 2. Infectious or inflammatory material extends posteriorly from the disc space into the epidural  space with superior extension 2.5 cm from the inferior endplate of L4 within the left lateral recess. 3. Contrast could not be given due to renal insufficiency and a GFR of 28. 4. Mild central and bilateral foraminal stenosis at L3-4. 5. Moderate left and mild right foraminal stenosis at L4-5. 6. Mild subarticular narrowing bilaterally at L5-S1 secondary to moderate facet hypertrophy and spurring. Electronically Signed   By: Marin Roberts M.D.   On: 05/14/2016 21:40    Labs:  CBC:  Recent Labs  04/07/16 0521 04/09/16 0755 04/11/16 1219 05/14/16 1551  WBC 4.9 5.4 5.3 8.1  HGB 8.5* 8.7* 9.6* 10.1*  HCT 27.2* 27.6* 30.5* 31.9*  PLT 123* 137* 146* 174    COAGS:  Recent Labs  12/25/15 2034 03/30/16 1447  INR 1.29 1.27  APTT 40* 42*    BMP:  Recent Labs  04/06/16 0530 04/09/16 0754 04/11/16 1219 05/14/16 1551  NA 135 134* 134* 135  K 3.9 3.9 3.8 2.9*  CL 99* 100* 98* 97*  CO2 26 28 28 28   GLUCOSE 97 97 153* 86  BUN 14 23* 30* 11  CALCIUM 7.9* 7.9* 7.6* 7.8*  CREATININE 2.91*  2.92* 3.17* 2.93* 2.24*  GFRNONAA 20*  20* 18* 20* 27*  GFRAA 23*  23* 21* 23* 31*    LIVER FUNCTION TESTS:  Recent Labs  12/25/15 1412 12/26/15 0518  03/30/16 1447 04/04/16 0703 04/06/16 0530 04/09/16 0754 04/11/16 1219  BILITOT 0.8 0.7  --  1.2  --   --   --   --   AST 19 14*  --  26  --   --   --   --   ALT 20 13*  --  19  --   --   --   --   ALKPHOS 109 89  --  102  --   --   --   --   PROT 5.3* 4.0*  --  4.3*  --   --   --   --   ALBUMIN 2.7* 2.2*  < > 2.3* 2.0* 2.1* 2.2* 2.1*  < > = values in this interval not displayed.  TUMOR MARKERS: No results for input(s): AFPTM, CEA, CA199, CHROMGRNA in the last 8760 hours.  Assessment and Plan:   L4 diskitis For aspiration in IR 6/2 Risks and Benefits discussed with the patient and daughter including, but not limited to bleeding, infection, damage to adjacent structures or low yield requiring additional tests. All of  the patient's and daughter questions were answered, patient is agreeable to proceed. Consent signed and in chart.   Thank you for this interesting consult.  I greatly enjoyed meeting Laurie Lovejoy and look forward to participating in their care.  A copy of this report was sent to the requesting provider on this date.  Electronically Signed: Ralene Muskrat A 05/15/2016, 1:37 PM  I spent a total of 40 Minutes    in face to face in clinical consultation, greater than 50% of which was counseling/coordinating care for L4 disc aspiration

## 2016-05-15 NOTE — Progress Notes (Signed)
Initial Nutrition Assessment  DOCUMENTATION CODES:   Severe malnutrition in context of chronic illness  INTERVENTION:   -Boost Breeze po TID, each supplement provides 250 kcal and 9 grams of protein -RD will adjust supplement regimen as appropriate once diet advances -If pt unable to take adequate PO, consider:  Initiate Nepro @ 20 ml/hr and increase by 10 ml every 12 hours to goal rate of 40 ml/hr.   Tube feeding regimen provides 1728 kcal (100% of needs), 98 grams of protein, and 698 ml of H2O.   NUTRITION DIAGNOSIS:   Malnutrition related to chronic illness as evidenced by severe depletion of body fat, severe depletion of muscle mass.  GOAL:   Patient will meet greater than or equal to 90% of their needs  MONITOR:   PO intake, Supplement acceptance, Diet advancement, Labs, Weight trends, Skin, I & O's  REASON FOR ASSESSMENT:   Malnutrition Screening Tool    ASSESSMENT:   Jeffrey Walls is a 75 y.o. male with medical history significant of ESRD, HTN, Combined CHF, [facility] coag negative staph bacteremia, felt to be related to his hemodialysis catheter, which was apparently replaced, and patient was placed on IV vancomycin for 6 weeks, supposed to finish the treatment in couple of days. 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  Pt admitted with sepsis due to probable discitis and osteomyelitis.   Pt with hx of ESRD on HD, per nephrology notes, pt often terminates sessions early due to low back pain.   Spoke with pt at bedside. He reports a general decline in health "for a while", with most notable decline over the past 1-2 months. Pt reports he has been feeling very poorly since starting HD. He also complains of poor appetite and dysphagia; when asked what happens when he attempts to swallow he states "it just won't go down".    Pt endorses weight loss, fat, and muscle loss, but unable to provide further details. Per wt hx, noted a 9% wt loss within the past 4 months.    Nutrition-Focused physical exam completed. Findings are severe fat depletion, severe muscle depletion, and no edema.   Per nephrology notes, pt may require nutrition support.   Case discussed with RN. She reports that has been ordered a swallow eval for today. RN suspects that he will pass as pt tolerated medications crushed in applesauce well.   Labs reviewed.   Diet Order:  Diet clear liquid Room service appropriate?: Yes; Fluid consistency:: Thin Diet NPO time specified Except for: Sips with Meds  Skin:  Reviewed, no issues  Last BM:  PTA  Height:   Ht Readings from Last 1 Encounters:  05/14/16 5\' 6"  (1.676 m)    Weight:   Wt Readings from Last 1 Encounters:  05/14/16 120 lb 2.4 oz (54.5 kg)    Ideal Body Weight:  64.5 kg  BMI:  Body mass index is 19.4 kg/(m^2).  Estimated Nutritional Needs:   Kcal:  1700-1900  Protein:  80-95 grams  Fluid:  per MD  EDUCATION NEEDS:   No education needs identified at this time  Sianni Cloninger A. Mayford KnifeWilliams, RD, LDN, CDE Pager: [phone] After hours Pager: [phone]

## 2016-05-15 NOTE — Evaluation (Signed)
Clinical/Bedside Swallow Evaluation Patient Details  Name: Jeffrey Walls MRN: [id number] Date of Birth: 03-08-1941  Today's Date: 05/15/2016 Time: SLP Start Time (ACUTE ONLY): 1423 SLP Stop Time (ACUTE ONLY): 1434 SLP Time Calculation (min) (ACUTE ONLY): 11 min  Past Medical History:  Past Medical History  Diagnosis Date  . Renal disorder   . Hypertension   . Diabetes mellitus without complication (HCC)   . Coronary artery disease   . Shortness of breath dyspnea   . ANCA-associated vasculitis (HCC)   . Pneumocystis jiroveci pneumonia [facility])    Past Surgical History:  Past Surgical History  Procedure Laterality Date  . Av fistula placement Left 03/04/2016    Procedure: ARTERIOVENOUS (AV) FISTULA CREATION LEFT RADIOCEPHALIC;  Surgeon: Chuck Hint, MD;  Location: [facility] OR;  Service: Vascular;  Laterality: Left;  . Insertion of dialysis catheter N/A 04/06/2016    Procedure: INSERTION OF DIALYSIS CATHETER;  Surgeon: Sherren Kerns, MD;  Location: [facility] OR;  Service: Vascular;  Laterality: N/A;  . Tee without cardioversion N/A 04/08/2016    Procedure: TRANSESOPHAGEAL ECHOCARDIOGRAM (TEE);  Surgeon: Laurey Morale, MD;  Location: [facility] ENDOSCOPY;  Service: Cardiovascular;  Laterality: N/A;   HPI:  75 yo male admitted wtih sepsis due to probable discitis and osteomyelitis who also describes difficulty getting POs down. PMH: ESRD, HTN, DM, CAD, SOB, PNA   Assessment / Plan / Recommendation Clinical Impression  Pt's oropharyngeal swallow appears WFL, but he describes the solid POs getting "stuck" - even with pureed foods like applesauce. Suspect a primary esophageal dysphagia. Recommend MD consider esophageal work up with advancement to soft solids as pt tolerates. Will follow briefly for tolerance.    Aspiration Risk  Mild aspiration risk    Diet Recommendation Dysphagia 3 (Mech soft);Thin liquid (advanced per MD as tolerated by pt)   Liquid Administration via: Cup;Straw Medication  Administration: Crushed with puree Supervision: Patient able to self feed;Intermittent supervision to cue for compensatory strategies Compensations: Slow rate;Small sips/bites;Follow solids with liquid Postural Changes: Seated upright at 90 degrees;Remain upright for at least 30 minutes after po intake    Other  Recommendations Recommended Consults: Consider GI evaluation;Consider esophageal assessment Oral Care Recommendations: Oral care BID   Follow up Recommendations   (tba)    Frequency and Duration min 2x/week  1 week       Prognosis Prognosis for Safe Diet Advancement: Good      Swallow Study   General HPI: 75 yo male admitted wtih sepsis due to probable discitis and osteomyelitis who also describes difficulty getting POs down. PMH: ESRD, HTN, DM, CAD, SOB, PNA Type of Study: Bedside Swallow Evaluation Previous Swallow Assessment: none in chart Diet Prior to this Study: Thin liquids Temperature Spikes Noted: Yes Respiratory Status: Room air History of Recent Intubation: No Behavior/Cognition: Alert;Cooperative Oral Cavity Assessment: Within Functional Limits Oral Care Completed by SLP: No Vision: Functional for self-feeding Self-Feeding Abilities: Able to feed self Patient Positioning: Upright in bed Baseline Vocal Quality: Normal    Oral/Motor/Sensory Function Overall Oral Motor/Sensory Function: Within functional limits   Ice Chips Ice chips: Not tested   Thin Liquid Thin Liquid: Within functional limits Presentation: Self Fed;Straw    Nectar Thick Nectar Thick Liquid: Not tested   Honey Thick Honey Thick Liquid: Not tested   Puree Puree: Impaired Presentation: Self Fed;Spoon Other Comments: pt says it won't go down   Solid   GO   Solid: Impaired Presentation: Self Fed Other Comments: pt says it won't go down  Maxcine HamLaura Paiewonsky, M.A. CCC-SLP [phone]  Maxcine Hamaiewonsky, Rital Cavey 05/15/2016,4:49 PM

## 2016-05-15 NOTE — Progress Notes (Signed)
Subjective: Patient reports Patient looks like he is doing better this morning he says last night his back didn't hurt this morning is bothering him a little bit. He denies any numbness tingling or pain in his legs. He denies any neck pain arm pain his biggest complaint is difficulty swallowing  Objective: Vital signs in last 24 hours: Temp:  [98 F (36.7 C)-101 F (38.3 C)] 98.4 F (36.9 C) (06/01 0536) Pulse Rate:  [89-104] 92 (06/01 0537) Resp:  [17-20] 17 (06/01 0537) BP: (121-167)/(62-85) 139/71 mmHg (06/01 0537) SpO2:  [88 %-100 %] 93 % (06/01 0537) Weight:  [50.803 kg (112 lb)-54.5 kg (120 lb 2.4 oz)] 54.5 kg (120 lb 2.4 oz) (05/31 1928)  Intake/Output from previous day: 05/31 0701 - 06/01 0700 In: 1000 [I.V.:1000] Out: -  Intake/Output this shift:    Patient is awake he still confused although the confusion does seem to be less than last night he certainly much more alert and interactive and able to comply with physical exam. Strength is 5 out of 5 in his upper and lower extremities. Oropharynx does not appear to be swollen and that he see no signs of thrush.  Lab Results:  Recent Labs  05/14/16 1551  WBC 8.1  HGB 10.1*  HCT 31.9*  PLT 174   BMET  Recent Labs  05/14/16 1551  NA 135  K 2.9*  CL 97*  CO2 28  GLUCOSE 86  BUN 11  CREATININE 2.24*  CALCIUM 7.8*    Studies/Results: Ct Head Wo Contrast  05/14/2016  CLINICAL DATA:  Fall today. Altered mental status. Agitation/confusion. 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 EXAM: CT HEAD WITHOUT CONTRAST TECHNIQUE: Contiguous axial images were obtained from the base of the skull through the vertex without intravenous contrast. COMPARISON:  05/09/2016 FINDINGS: Small remote lacunar infarcts in the left lentiform nucleus. Otherwise, the brainstem, cerebellum, cerebral peduncles, thalami, basal ganglia, basilar cisterns, and ventricular system appear within normal limits. Periventricular white matter and corona radiata  hypodensities favor chronic ischemic microvascular white matter disease. No intracranial hemorrhage, mass lesion, or acute CVA. IMPRESSION: 1. No acute intracranial findings. 2. Periventricular white matter and corona radiata hypodensities favor chronic ischemic microvascular white matter disease. 3. 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 Electronically Signed   By: Gaylyn Rong M.D.   On: 05/14/2016 16:51   Ct Lumbar Spine Wo Contrast  05/14/2016  CLINICAL DATA:  Fall on Tuesday. Pain in the back. This is worsened. EXAM: CT LUMBAR SPINE WITHOUT CONTRAST TECHNIQUE: Multidetector CT imaging of the lumbar spine was performed without intravenous contrast administration. Multiplanar CT image reconstructions were also generated. COMPARISON:  05/09/2016 FINDINGS: Acute 15% superior endplate compression fracture at T12 with endplate sclerosis and some cortical irregularity. No significant bony retropulsion. This is new compared to the MRI from 04/05/2016. Abnormal erosive findings along the inferior endplate of L4 and superior endplate of L5. These lack the characteristic sclerotic margin for Schmorl's nodes, and moreover there is some sclerosis and interval flattening along the anterior superior endplate of L5 compared to the 04/05/2016 exam. These heighten my concern that the findings at L4-5 are probably due to discitis -osteomyelitis. There is also prominence of the paraspinal tissues at this level deep to the psoas muscles, probably inflammatory. There is also a suggestion of degenerative disc disease at this level potentially causing foraminal impingement. Strictly speaking I cannot exclude epidural abscess given the slight epidural prominence in this vicinity and concern for discitis. Along the inferior endplate  of L3 there is a more typical appearing Schmorl' s node, with sclerotic margin. No vertebral subluxation is observed. The is a right twelfth rib fracture posteriorly on  image 39/2 L1. Bridging spurring of the left sacroiliac joint. Aortoiliac atherosclerotic vascular disease. Bilateral pleural effusions. Bilateral perirenal stranding. Diffuse subcutaneous and mesenteric edema. IMPRESSION: 1. 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 2. There is also a new 15% superior endplate compression fracture T12 as well as a nondisplaced fracture of the right T12 rib posteriorly. 3. Bilateral pleural effusions are at least moderate in size but only partially characterized on today' s exam. 4. Perirenal, subcutaneous, and mesenteric edema. Electronically Signed   By: Gaylyn Rong M.D.   On: 05/14/2016 17:10   Mr Lumbar Spine Wo Contrast  05/14/2016  CLINICAL DATA:  Discitis on treatment for 1 month. Increased confusion and fever. Abnormal CT scan. EXAM: MRI LUMBAR SPINE WITHOUT CONTRAST TECHNIQUE: Multiplanar, multisequence MR imaging of the lumbar spine was performed. No intravenous contrast was administered. COMPARISON:  CT of the lumbar spine 05/14/2016. MRI of the lumbar spine 04/05/2016. FINDINGS: Segmentation: 5 non rib-bearing lumbar type vertebral bodies are present. Alignment: AP alignment is anatomic. Leftward curvature of the lumbar spine is centered at L4-5. Vertebrae: A superior endplate fracture at T12 is again seen, incompletely healed. Diffuse edematous changes are present at L4-5. Conus medullaris: Extends to the T12-L1 level and appears normal. Paraspinal and other soft tissues: Edematous paraspinous changes are present at L4-5 with extension into the psoas musculature. There is no discrete abscess. A 10 mm cyst is present at the lower pole of the left kidney. No other focal solid organ lesions are present. Disc levels:  The disc levels at L3-4 above are normal. L3-4: A broad-based disc protrusion is present. Moderate facet hypertrophy is noted. This results in mild central and bilateral foraminal stenosis. L4-5: There is further destruction of endplates. Abnormal signal is present within the disc space and extending into the epidural space on the left. Disc fragment or epidural abscess extends superiorly along the posterior margin of the left L4 vertebral body 2.5 cm from the inferior endplate of L4. Moderate left subarticular stenosis is present. Moderate left and mild right foraminal narrowing is present. L5-S1: Moderate facet hypertrophy is present bilaterally. There is mild subarticular narrowing on both sides. IMPRESSION: 1. 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 2. Infectious or inflammatory material extends posteriorly from the disc space into the epidural space with superior extension 2.5 cm from the inferior endplate of L4 within the left lateral recess. 3. Contrast could not be given due to renal insufficiency and a GFR of 28. 4. Mild central and bilateral foraminal stenosis at L3-4. 5. Moderate left and mild right foraminal stenosis at L4-5. 6. Mild subarticular narrowing bilaterally at L5-S1 secondary to moderate facet hypertrophy and spurring. Electronically Signed   By: Marin Roberts M.D.   On: 05/14/2016 21:40    Assessment/Plan: 75 year old male with refractory osteomyelitis and discitis. MRI scan last night limited study not no contrasted images obtained that showed a small collection behind the L4 vertebral body which certainly could be epidural abscess however mild stenosis some lateral recess stenosis primarily off the side on the left at L4-5. Patient is currently isn't dramatic from  a radicular pain perspective it's all back pain I would recommend continue maximal medical treatment with recommendations from infectious disease regarding  expansion of coverage. Patient may need a workup for dysphagia as he says this is been going on for for 5 days.  LOS: 1 day     Izaih Kataoka P 05/15/2016, 7:53 AM

## 2016-05-15 NOTE — Care Management Note (Addendum)
Case Management Note  Patient Details  Name: Jeffrey Walls MRN: [id number] Date of Birth: [birth year]  Subjective/Objective:                 Admitted with sepsis. Receiving Vanc w/ HD as oupatient. Admitted with bacteremia 1 month ago. C/O dysphagia.  Addendum- 05/16/16 patient continues to be confused today.   Action/Plan:  CM will continue to follow for Dc planning needs.  Expected Discharge Date:                  Expected Discharge Plan:  Home/Self Care  In-House Referral:     Discharge planning Services  CM Consult  Post Acute Care Choice:    Choice offered to:     DME Arranged:    DME Agency:     HH Arranged:    HH Agency:     Status of Service:  In process, will continue to follow  Medicare Important Message Given:    Date Medicare IM Given:    Medicare IM give by:    Date Additional Medicare IM Given:    Additional Medicare Important Message give by:     If discussed at Long Length of Stay Meetings, dates discussed:    Additional Comments:  Lawerance SabalDebbie Camri Molloy, RN 05/15/2016, 4:08 PM

## 2016-05-15 NOTE — Progress Notes (Addendum)
PHARMACY - PHYSICIAN COMMUNICATION CRITICAL VALUE ALERT - BLOOD CULTURE IDENTIFICATION (BCID)  Results for orders placed or performed during the hospital encounter of 05/14/16  Blood Culture ID Panel (Reflexed) (Collected: 05/14/2016  4:10 PM)  Result Value Ref Range   Enterococcus species NOT DETECTED NOT DETECTED   Vancomycin resistance NOT DETECTED NOT DETECTED   Listeria monocytogenes NOT DETECTED NOT DETECTED   Staphylococcus species NOT DETECTED NOT DETECTED   Staphylococcus aureus NOT DETECTED NOT DETECTED   Methicillin resistance NOT DETECTED NOT DETECTED   Streptococcus species NOT DETECTED NOT DETECTED   Streptococcus agalactiae NOT DETECTED NOT DETECTED   Streptococcus pneumoniae NOT DETECTED NOT DETECTED   Streptococcus pyogenes NOT DETECTED NOT DETECTED   Acinetobacter baumannii NOT DETECTED NOT DETECTED   Enterobacteriaceae species DETECTED (A) NOT DETECTED   Enterobacter cloacae complex NOT DETECTED NOT DETECTED   Escherichia coli NOT DETECTED NOT DETECTED   Klebsiella oxytoca NOT DETECTED NOT DETECTED   Klebsiella pneumoniae DETECTED (A) NOT DETECTED   Proteus species NOT DETECTED NOT DETECTED   Serratia marcescens NOT DETECTED NOT DETECTED   Carbapenem resistance NOT DETECTED NOT DETECTED   Haemophilus influenzae NOT DETECTED NOT DETECTED   Neisseria meningitidis NOT DETECTED NOT DETECTED   Pseudomonas aeruginosa NOT DETECTED NOT DETECTED   Candida albicans NOT DETECTED NOT DETECTED   Candida glabrata NOT DETECTED NOT DETECTED   Candida krusei NOT DETECTED NOT DETECTED   Candida parapsilosis NOT DETECTED NOT DETECTED   Candida tropicalis NOT DETECTED NOT DETECTED    Name of physician (or Provider) Contacted: Dr. Luciana Axeomer  Changes to prescribed antibiotics required: n/a- holding antibiotics until after biopsy tomorrow  Wilda Wetherell 05/15/2016  4:28 PM

## 2016-05-15 NOTE — Progress Notes (Signed)
PROGRESS NOTE                                                                                                                                                                                                             Patient Demographics:    Jeffrey Walls, is a 75 y.o. male, DOB - Apr 24, 1941, ZOX:[id number]  Admit date - 05/14/2016   Admitting Physician Costin Otelia Sergeant, MD  Outpatient Primary MD for the patient is Theressa Stamps, MD  LOS - 1  Outpatient Specialists: Renal Dr. Algis Liming  Chief Complaint  Patient presents with  . Back Pain       Brief Narrative   75 year old male with history of ESRD on dialysis, combined systolic and diastolic CHF, recent prolonged hospitalization at [facility] for coag-negative staph bacteremia, source felt to be from his dialysis catheter which was replaced and placed on IV vancomycin for 6 weeks course (starting 04/04/2016). 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 Patient presented to the ED with low back pain and acute encephalopathy. Due to his progressive back pain he was unable to complete hemodialysis fairly. In the ED was septic with fever of 101F and mildly tachycardic. Blood work showed hemoglobin of 10.1, potassium of 2.9 and creatinine of 2.24. Lactic acid was normal. Head CT was unremarkable. CT of the lumbar spine was concerning for discitis and osteomyelitis at the L4-5 level with bony destructive changes and unable to exclude epidural abscess. An MRI of the lumbar spine was done which showed progressive discitis at the L4-5 with destruction of endplates and diffuse marrow edema. Also showed infectious/inflammatory material extending posteriorly into the epidural space from the inferior endplate of L4 within the left lateral recess.  Patient admitted to hospitalist service. Neurosurgery and ID  consulted.   Subjective:   Patient is a poor historian. Complains of low back pain and generalized weakness. Also reported having dysphagia for the past 5 days with poor by mouth intake.   Assessment  & Plan :   Principal problem Sepsis secondary to lumbar discitis/osteomyelitis and epidural abscess On vancomycin with dialysis. Holding further antibiotic until disc aspirate done by IR tomorrow. ID following an appreciate recommendations. Seen by neurosurgeon Dr. Wynetta Emery who recommended to continue with aggressive medical management and given  lack of focal neurological deficit does not need surgical intervention at this time. Repeat blood cultures sent (both periphery and central line). Sepsis pathway initiated on admission and received IV bolus. On oxycodone for pain. Minimize use as possible given his encephalopathy.   Active Problems: Acute encephalopathy Suspect in the setting of sepsis. However given his dysphagia and generalized weakness I will obtain an MRI of his brain to rule out stroke. PT evaluation. Minimize narcotic use. Reduce frequency of Xanax.  Acute dysphagia Reports symptoms for the past 5 days with difficulty swallowing mostly solid and having episodes of regurgitation and vomiting. Was able to swallow his medications without any problem. Placed on clear liquids. Swallow evaluation. Depending upon his swallow evaluation will consult GI.  ESRD on M, W, F Only had 1 hour of dialysis yesterday due to severe back pain and was brought to the ED. Will consult nephrology.    Anemia of renal disease At baseline.    Essential hypertension Stable. Resume home medications.    Pancytopenia (HCC)   Diabetes mellitus with diabetic nephropathy, with long-term current use of insulin (HCC) Stable. Monitor on sliding scale coverage.    Protein-calorie malnutrition, severe Nutrition consult.     Bacteremia, coagulase-negative staphylococcal Getting 6 weeks course of IV vancomycin  with dialysis since 04/04/2016. TEE showed right atrial mass and was planned for repeat echo after completion of antibiotics. Repeat echo ordered.  Hypokalemia correct with HD      Code Status : full code  Family Communication  : will update daughter  Disposition Plan  : pending workup. May need SNF  Barriers For Discharge : active symptoms. W/up pending  Consults  :   Renal ID Neurosurgery (Dr Wynetta Emeryram) IR   Procedures  :  CT head  CT lumbar spine  MRI lumbar spine  DVT Prophylaxis  :   Sq heparin  Lab Results  Component Value Date   PLT 174 05/14/2016    Antibiotics  :    Anti-infectives    Start     Dose/Rate Route Frequency Ordered Stop   05/16/16 1200  vancomycin (VANCOCIN) 500 mg in sodium chloride 0.9 % 100 mL IVPB     500 mg 100 mL/hr over 60 Minutes Intravenous Every M-W-F (Hemodialysis) 05/14/16 1907     05/14/16 1745  vancomycin (VANCOCIN) IVPB 1000 mg/200 mL premix     1,000 mg 200 mL/hr over 60 Minutes Intravenous  Once 05/14/16 1736 05/14/16 1914        Objective:   Filed Vitals:   05/14/16 1928 05/14/16 2139 05/15/16 0536 05/15/16 0537  BP: 139/67 159/76  139/71  Pulse: 89 89  92  Temp: 98.7 F (37.1 C) 98 F (36.7 C) 98.4 F (36.9 C)   TempSrc: Oral  Oral   Resp:  20  17  Height: 5\' 6"  (1.676 m)     Weight: 54.5 kg (120 lb 2.4 oz)     SpO2: 97% 100%  93%    Wt Readings from Last 3 Encounters:  05/14/16 54.5 kg (120 lb 2.4 oz)  04/11/16 51.8 kg (114 lb 3.2 oz)  03/05/16 57.1 kg (125 lb 14.1 oz)     Intake/Output Summary (Last 24 hours) at 05/15/16 1352 Last data filed at 05/14/16 1825  Gross per 24 hour  Intake   1000 ml  Output      0 ml  Net   1000 ml     Physical Exam  Gen: not in distress, fatigued HEENT: no  pallor, moist mucosa, supple neck Chest: clear b/l, no added sounds CVS: N S1&S2, no murmurs, rubs or gallop GI: soft, NT, ND, BS+ Musculoskeletal: warm, no edema, low back tenderness to pressure. CNS: AAOX2,  confused    Data Review:    CBC  Recent Labs Lab 05/14/16 1551  WBC 8.1  HGB 10.1*  HCT 31.9*  PLT 174  MCV 88.9  MCH 28.1  MCHC 31.7  RDW 16.6*  LYMPHSABS 0.6*  MONOABS 0.6  EOSABS 0.0  BASOSABS 0.0    Chemistries   Recent Labs Lab 05/14/16 1551  NA 135  K 2.9*  CL 97*  CO2 28  GLUCOSE 86  BUN 11  CREATININE 2.24*  CALCIUM 7.8*   ------------------------------------------------------------------------------------------------------------------ No results for input(s): CHOL, HDL, LDLCALC, TRIG, CHOLHDL, LDLDIRECT in the last 72 hours.  Lab Results  Component Value Date   HGBA1C 5.5 03/31/2016   ------------------------------------------------------------------------------------------------------------------ No results for input(s): TSH, T4TOTAL, T3FREE, THYROIDAB in the last 72 hours.  Invalid input(s): FREET3 ------------------------------------------------------------------------------------------------------------------ No results for input(s): VITAMINB12, FOLATE, FERRITIN, TIBC, IRON, RETICCTPCT in the last 72 hours.  Coagulation profile No results for input(s): INR, PROTIME in the last 168 hours.  No results for input(s): DDIMER in the last 72 hours.  Cardiac Enzymes No results for input(s): CKMB, TROPONINI, MYOGLOBIN in the last 168 hours.  Invalid input(s): CK ------------------------------------------------------------------------------------------------------------------    Component Value Date/Time   BNP >4500.0* 02/28/2016 2200    Inpatient Medications  Scheduled Meds: . aspirin EC  81 mg Oral Daily  . citalopram  20 mg Oral Daily  . folic acid  1 mg Oral Daily  . insulin aspart  0-9 Units Subcutaneous TID WC  . pantoprazole  20 mg Oral Daily  . sodium chloride flush  3 mL Intravenous Q12H  . [START ON 05/16/2016] vancomycin  500 mg Intravenous Q M,W,F-HD   Continuous Infusions:  PRN Meds:.ALPRAZolam, oxyCODONE  Micro  Results No results found for this or any previous visit (from the past 240 hour(s)).  Radiology Reports Ct Head Wo Contrast  05/14/2016  CLINICAL DATA:  Fall today. Altered mental status. Agitation/confusion. 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 EXAM: CT HEAD WITHOUT CONTRAST TECHNIQUE: Contiguous axial images were obtained from the base of the skull through the vertex without intravenous contrast. COMPARISON:  05/09/2016 FINDINGS: Small remote lacunar infarcts in the left lentiform nucleus. Otherwise, the brainstem, cerebellum, cerebral peduncles, thalami, basal ganglia, basilar cisterns, and ventricular system appear within normal limits. Periventricular white matter and corona radiata hypodensities favor chronic ischemic microvascular white matter disease. No intracranial hemorrhage, mass lesion, or acute CVA. IMPRESSION: 1. No acute intracranial findings. 2. Periventricular white matter and corona radiata hypodensities favor chronic ischemic microvascular white matter disease. 3. 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 Electronically Signed   By: Gaylyn Rong M.D.   On: 05/14/2016 16:51   Ct Lumbar Spine Wo Contrast  05/14/2016  CLINICAL DATA:  Fall on Tuesday. Pain in the back. This is worsened. EXAM: CT LUMBAR SPINE WITHOUT CONTRAST TECHNIQUE: Multidetector CT imaging of the lumbar spine was performed without intravenous contrast administration. Multiplanar CT image reconstructions were also generated. COMPARISON:  05/09/2016 FINDINGS: Acute 15% superior endplate compression fracture at T12 with endplate sclerosis and some cortical irregularity. No significant bony retropulsion. This is new compared to the MRI from 04/05/2016. Abnormal erosive findings along the inferior endplate of L4 and superior endplate of L5. These lack the characteristic sclerotic margin for Schmorl's nodes, and moreover there is some  sclerosis and interval flattening along the anterior superior  endplate of L5 compared to the 04/05/2016 exam. These heighten my concern that the findings at L4-5 are probably due to discitis -osteomyelitis. There is also prominence of the paraspinal tissues at this level deep to the psoas muscles, probably inflammatory. There is also a suggestion of degenerative disc disease at this level potentially causing foraminal impingement. Strictly speaking I cannot exclude epidural abscess given the slight epidural prominence in this vicinity and concern for discitis. Along the inferior endplate of L3 there is a more typical appearing Schmorl' s node, with sclerotic margin. No vertebral subluxation is observed. The is a right twelfth rib fracture posteriorly on image 39/2 L1. Bridging spurring of the left sacroiliac joint. Aortoiliac atherosclerotic vascular disease. Bilateral pleural effusions. Bilateral perirenal stranding. Diffuse subcutaneous and mesenteric edema. IMPRESSION: 1. 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 2. There is also a new 15% superior endplate compression fracture T12 as well as a nondisplaced fracture of the right T12 rib posteriorly. 3. Bilateral pleural effusions are at least moderate in size but only partially characterized on today' s exam. 4. Perirenal, subcutaneous, and mesenteric edema. Electronically Signed   By: Gaylyn Rong M.D.   On: 05/14/2016 17:10   Mr Lumbar Spine Wo Contrast  05/14/2016  CLINICAL DATA:  Discitis on treatment for 1 month. Increased confusion and fever. Abnormal CT scan. EXAM: MRI LUMBAR SPINE WITHOUT CONTRAST TECHNIQUE: Multiplanar, multisequence MR imaging of the lumbar spine was performed. No intravenous contrast was administered. COMPARISON:  CT of the lumbar spine  05/14/2016. MRI of the lumbar spine 04/05/2016. FINDINGS: Segmentation: 5 non rib-bearing lumbar type vertebral bodies are present. Alignment: AP alignment is anatomic. Leftward curvature of the lumbar spine is centered at L4-5. Vertebrae: A superior endplate fracture at T12 is again seen, incompletely healed. Diffuse edematous changes are present at L4-5. Conus medullaris: Extends to the T12-L1 level and appears normal. Paraspinal and other soft tissues: Edematous paraspinous changes are present at L4-5 with extension into the psoas musculature. There is no discrete abscess. A 10 mm cyst is present at the lower pole of the left kidney. No other focal solid organ lesions are present. Disc levels: The disc levels at L3-4 above are normal. L3-4: A broad-based disc protrusion is present. Moderate facet hypertrophy is noted. This results in mild central and bilateral foraminal stenosis. L4-5: There is further destruction of endplates. Abnormal signal is present within the disc space and extending into the epidural space on the left. Disc fragment or epidural abscess extends superiorly along the posterior margin of the left L4 vertebral body 2.5 cm from the inferior endplate of L4. Moderate left subarticular stenosis is present. Moderate left and mild right foraminal narrowing is present. L5-S1: Moderate facet hypertrophy is present bilaterally. There is mild subarticular narrowing on both sides. IMPRESSION: 1. 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 2. Infectious or inflammatory material extends posteriorly from the disc space into the epidural space with superior extension 2.5 cm from the inferior endplate of L4 within the left lateral recess. 3. Contrast could not be given due to renal insufficiency and a GFR of 28. 4. Mild central and bilateral foraminal stenosis at L3-4. 5. Moderate left and mild right foraminal stenosis at L4-5.  6. Mild subarticular narrowing  bilaterally at L5-S1 secondary to moderate facet hypertrophy and spurring. Electronically Signed   By: Marin Roberts M.D.   On: 05/14/2016 21:40    Time Spent in minutes  25   Eddie North M.D on 05/15/2016 at 1:52 PM  Between 7am to 7pm - Pager - [phone]  After 7pm go to [email or web address] - password [facility]  [facility] -  Office  [phone]

## 2016-05-15 NOTE — Consult Note (Signed)
[facility]       Reason for Consult:discitis    Referring Physician: Dr. Gonzella Lex  Active Problems:   Anemia of renal disease   Essential hypertension   Pancytopenia (HCC)   Diabetes mellitus with diabetic nephropathy, with long-term current use of insulin (HCC)   Protein-calorie malnutrition, severe   End stage renal disease (HCC)   Bacteremia, coagulase-negative staphylococcal   Sepsis (HCC)   . aspirin EC  81 mg Oral Daily  . citalopram  20 mg Oral Daily  . folic acid  1 mg Oral Daily  . insulin aspart  0-9 Units Subcutaneous TID WC  . pantoprazole  20 mg Oral Daily  . sodium chloride flush  3 mL Intravenous Q12H  . [START ON 05/16/2016] vancomycin  500 mg Intravenous Q M,W,F-HD    Recommendations: Hold antibiotics pending biopsy Culture of disc for bacterial, AFB, fungal  Repeat blood cultures sent Repeat echo evaluation of ? 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  CoNS 2/3.    Antibiotics: Vancomycin with dialysis since 4/21  HPI: Jeffrey Walls is a 75 y.o. male with ESRD, recent hospitalization with CoNS bacteremia and TEE with RA mass identified who comes in with confusion and continued back pain. In discussion with the daughter, he has been complaining of back pain for at least 1-2 months, preceeding the previous hospitalization and now MRI with likely discitis.  He also has been confused which the daughter relates to pain medications.   MRI independently reviewed and some area of discitis.  Previous MRI not felt to be as likely.   Review of Systems:  Unable to be assessed due to mental status All other systems reviewed and are negative   Past Medical History  Diagnosis Date  . Renal disorder   . Hypertension   . Diabetes mellitus without complication (HCC)   . Coronary artery disease   . Shortness of breath dyspnea   . ANCA-associated vasculitis (HCC)   . Pneumocystis jiroveci pneumonia  [facility])     Social History  Substance Use Topics  . Smoking status: Never Smoker   . Smokeless tobacco: None  . Alcohol Use: No    History reviewed. No pertinent family history.  Allergies  Allergen Reactions  . Ampicillin Rash    Ankle swelling  . Benadryl [Diphenhydramine Hcl] Itching    Rash    Physical Exam: Constitutional: in no apparent distress  Filed Vitals:   05/15/16 0536 05/15/16 0537  BP:  139/71  Pulse:  92  Temp: 98.4 F (36.9 C)   Resp:  17   EYES: anicteric ENMT:no thrush Cardiovascular: Cor RRR Respiratory: CTA B; normal respiratory effort GI: Bowel sounds are normal, liver is not enlarged, spleen is not enlarged Musculoskeletal: no pedal edema noted Skin: negatives: no rash Neuro: confused  Lab Results  Component Value Date   WBC 8.1 05/14/2016   HGB 10.1* 05/14/2016   HCT 31.9* 05/14/2016   MCV 88.9 05/14/2016   PLT 174 05/14/2016    Lab Results  Component Value Date   CREATININE 2.24* 05/14/2016   BUN 11 05/14/2016   NA 135 05/14/2016   K 2.9* 05/14/2016   CL 97* 05/14/2016   CO2 28 05/14/2016    Lab Results  Component Value Date   ALT 19 03/30/2016   AST 26 03/30/2016   ALKPHOS 102 03/30/2016     Microbiology: No results found for this or any previous visit (from  the past 240 hour(s)).  Staci RighterOMER, Leiana Rund, MD [facility] [email or web address] [phone] pager  [phone] cell 05/15/2016, 1:29 PM

## 2016-05-15 NOTE — Progress Notes (Signed)
  [facility] Progress Note   Subjective: confused, responsive, no distress, no pain or chills  Filed Vitals:   05/14/16 1928 05/14/16 2139 05/15/16 0536 05/15/16 0537  BP: 139/67 159/76  139/71  Pulse: 89 89  92  Temp: 98.7 F (37.1 C) 98 F (36.7 C) 98.4 F (36.9 C)   TempSrc: Oral  Oral   Resp:  20  17  Height: 5\' 6"  (1.676 m)     Weight: 54.5 kg (120 lb 2.4 oz)     SpO2: 97% 100%  93%    Inpatient medications: . aspirin EC  81 mg Oral Daily  . citalopram  20 mg Oral Daily  . feeding supplement  1 Container Oral TID BM  . folic acid  1 mg Oral Daily  . insulin aspart  0-9 Units Subcutaneous TID WC  . lisinopril  10 mg Oral Daily  . metoprolol succinate  25 mg Oral Daily  . pantoprazole  20 mg Oral Daily  . sodium chloride flush  3 mL Intravenous Q12H     ALPRAZolam, oxyCODONE  Exam: Confused, frail, no distress NO jvd Chest clear bilat L HD cath site no drainage/ erythema RRR soft SEM no RG Abd soft ntnd no ascites No LE edema L chest TDC clean exit site L FA AVF clean wounds, +bruit Neuro confused, nonfocal  Dialysis: MWF [facility] 56kg 2/2.25 bath Hep none LUA AVF (maturing)/ R IJ cath 4 hr      Assessment: 1  Fever/ worsening back pain- recent admit April for MRSE cath sepsis, L 4/5 discitis/osteo, T12 fx and 2x3cm vegetation on RA free wall. HD cath was removed/ replaced and dc'd to SNF for long course IV vanc.  Pt has missed OP vanc doses from signing off early at OP HD.  Seen by ID and Nsurg, w/u in progress.   2  ESRD HD MWF, 1 hr HD yest 3  HTN cont meds 4  MBD cont meds 5  Anemia cont meds  Plan - short HD today and reg HD tomorrow. May need cath removed if BCx's + again.    Vinson Moselleob Amye Grego MD [facility] pager [phone]    cell [phone] 05/15/2016, 3:07 PM    Recent Labs Lab 05/14/16 1551  NA 135  K 2.9*  CL 97*  CO2 28  GLUCOSE 86  BUN 11  CREATININE 2.24*  CALCIUM 7.8*   No results for  input(s): AST, ALT, ALKPHOS, BILITOT, PROT, ALBUMIN in the last 168 hours.  Recent Labs Lab 05/14/16 1551  WBC 8.1  NEUTROABS 7.0  HGB 10.1*  HCT 31.9*  MCV 88.9  PLT 174

## 2016-05-16 ENCOUNTER — Inpatient Hospital Stay (HOSPITAL_COMMUNITY): Payer: Medicare HMO

## 2016-05-16 DIAGNOSIS — R1314 Dysphagia, pharyngoesophageal phase: Secondary | ICD-10-CM

## 2016-05-16 DIAGNOSIS — T82898A Other specified complication of vascular prosthetic devices, implants and grafts, initial encounter: Secondary | ICD-10-CM

## 2016-05-16 DIAGNOSIS — M4640 Discitis, unspecified, site unspecified: Secondary | ICD-10-CM | POA: Insufficient documentation

## 2016-05-16 LAB — RENAL FUNCTION PANEL
ALBUMIN: 1.9 g/dL — AB (ref 3.5–5.0)
ANION GAP: 6 (ref 5–15)
BUN: 14 mg/dL (ref 6–20)
CHLORIDE: 101 mmol/L (ref 101–111)
CO2: 29 mmol/L (ref 22–32)
Calcium: 7.4 mg/dL — ABNORMAL LOW (ref 8.9–10.3)
Creatinine, Ser: 2.12 mg/dL — ABNORMAL HIGH (ref 0.61–1.24)
GFR, EST AFRICAN AMERICAN: 34 mL/min — AB (ref >60)
GFR, EST NON AFRICAN AMERICAN: 29 mL/min — AB (ref >60)
Glucose, Bld: 75 mg/dL (ref 65–99)
PHOSPHORUS: 2.7 mg/dL (ref 2.5–4.6)
POTASSIUM: 3 mmol/L — AB (ref 3.5–5.1)
Sodium: 136 mmol/L (ref 135–145)

## 2016-05-16 LAB — URINE CULTURE: Culture: <10000 — AB

## 2016-05-16 LAB — GLUCOSE, CAPILLARY
GLUCOSE-CAPILLARY: 62 mg/dL — AB (ref 65–99)
GLUCOSE-CAPILLARY: 80 mg/dL (ref 65–99)
GLUCOSE-CAPILLARY: 80 mg/dL (ref 65–99)

## 2016-05-16 LAB — PROTIME-INR
INR: 1.37 (ref 0.00–1.49)
PROTHROMBIN TIME: 17 s — AB (ref 11.6–15.2)

## 2016-05-16 LAB — CBC
HEMATOCRIT: 28.7 % — AB (ref 39.0–52.0)
HEMOGLOBIN: 9 g/dL — AB (ref 13.0–17.0)
MCH: 28.8 pg (ref 26.0–34.0)
MCHC: 31.4 g/dL (ref 30.0–36.0)
MCV: 92 fL (ref 78.0–100.0)
Platelets: 141 10*3/uL — ABNORMAL LOW (ref 150–400)
RBC: 3.12 MIL/uL — ABNORMAL LOW (ref 4.22–5.81)
RDW: 16.6 % — ABNORMAL HIGH (ref 11.5–15.5)
WBC: 5.7 10*3/uL (ref 4.0–10.5)

## 2016-05-16 LAB — APTT: APTT: 47 s — AB (ref 24–37)

## 2016-05-16 LAB — HEPATITIS B SURFACE ANTIGEN: Hepatitis B Surface Ag: NEGATIVE

## 2016-05-16 MED ORDER — FENTANYL CITRATE (PF) 100 MCG/2ML IJ SOLN
INTRAMUSCULAR | Status: DC | PRN
  Administered 2016-05-16: 25 ug via INTRAVENOUS

## 2016-05-16 MED ORDER — NYSTATIN 100000 UNIT/ML MT SUSP
5.0000 mL | Freq: Four times a day (QID) | OROMUCOSAL | Status: DC
  Administered 2016-05-16 – 2016-05-23 (×17): 500000 [IU] via ORAL
  Filled 2016-05-16 (×21): qty 5

## 2016-05-16 MED ORDER — FENTANYL CITRATE (PF) 100 MCG/2ML IJ SOLN
INTRAMUSCULAR | Status: AC
  Filled 2016-05-16: qty 2

## 2016-05-16 MED ORDER — CEFAZOLIN SODIUM 1-5 GM-% IV SOLN
1.0000 g | INTRAVENOUS | Status: AC
  Administered 2016-05-16 – 2016-05-18 (×3): 1 g via INTRAVENOUS
  Filled 2016-05-16 (×4): qty 50

## 2016-05-16 MED ORDER — WHITE PETROLATUM GEL
Status: AC
  Filled 2016-05-16: qty 1

## 2016-05-16 MED ORDER — MIDAZOLAM HCL 2 MG/2ML IJ SOLN
INTRAMUSCULAR | Status: AC
  Filled 2016-05-16: qty 2

## 2016-05-16 MED ORDER — FLUCONAZOLE 100 MG PO TABS
100.0000 mg | ORAL_TABLET | Freq: Every day | ORAL | Status: AC
  Administered 2016-05-16 – 2016-05-22 (×7): 100 mg via ORAL
  Filled 2016-05-16 (×7): qty 1

## 2016-05-16 MED ORDER — MIDAZOLAM HCL 2 MG/2ML IJ SOLN
INTRAMUSCULAR | Status: DC | PRN
  Administered 2016-05-16: 1 mg via INTRAVENOUS

## 2016-05-16 MED ORDER — LIDOCAINE HCL 1 % IJ SOLN
INTRAMUSCULAR | Status: AC
  Administered 2016-05-16: 20 mL
  Filled 2016-05-16: qty 20

## 2016-05-16 MED ORDER — LIDOCAINE HCL (PF) 2 % IJ SOLN
0.0000 mL | Freq: Once | INTRAMUSCULAR | Status: DC | PRN
  Filled 2016-05-16: qty 20

## 2016-05-16 NOTE — Progress Notes (Signed)
SLP Cancellation Note  Patient Details Name: Jeffrey Walls MRN: [id number] DOB: [birth year]   Cancelled treatment:       Reason Eval/Treat Not Completed: Patient at procedure or test/unavailable, see prior note, SLP suggested Esophagram.    Faelyn Sigler, Riley NearingBonnie Caroline 05/16/2016, 11:09 AM

## 2016-05-16 NOTE — Care Management Important Message (Signed)
Important Message  Patient Details  Name: Jeffrey Walls MRN: [id number] Date of Birth: 04-13-1941   Medicare Important Message Given:  Yes    Bernadette HoitShoffner, Jayceion Lisenby Coleman 05/16/2016, 11:04 AM

## 2016-05-16 NOTE — Progress Notes (Addendum)
    [facility]   Reason for visit: Follow up on discitis  Interval History: currently off the floor.  Klebsiella positive blood culture.  Had biopsy today.    Physical Exam: Constitutional:  Filed Vitals:   05/16/16 1220 05/16/16 1311  BP: 146/81 146/79  Pulse: 77 79  Temp:  98.4 F (36.9 C)  Resp:  15   patient appears in NAD  Impression: discitis.  Klebsiella bacteremia.  I am not sure if the Klebsiella is related. Will wait for biopsy results.   Plan: 1.  Agree with catheter removal with bacteremia 2.  Will start cefazolin for Klebsiella 3.  Wait for culture of disc results, though with prolonged course of vancomycin, I don't feel that MRSA coverage will be indicated at this point.    Dr. Daiva EvesVan Dam over the weekend

## 2016-05-16 NOTE — Progress Notes (Signed)
Pharmacy to review anticoagulants/antiplatelets post-IR procedure.  On aspirin 81mg  prior to procedure- dose was given this morning (appropriate). Deemed low risk for bleeding.  Aspirin can continue daily as ordered.  Jonatan Wilsey D. Abbigale Mcelhaney, PharmD, BCPS Clinical Pharmacist Pager: [phone] 05/16/2016 6:14 PM

## 2016-05-16 NOTE — Sedation Documentation (Signed)
Patient is resting comfortably. 

## 2016-05-16 NOTE — Progress Notes (Signed)
Pharmacy Antibiotic Note  Jeffrey Walls is a 75 y.o. male admitted on 05/14/2016 with bacteremia.  Pharmacy has been consulted for Ancef dosing.  Patient had HD today for 3h at BFR of 4800mL/min. IJ tunneled HD catheter removed after this session, so patient currently has no HD access. AVF still maturing. S/p disc aspiration this morning.  Plan: Ancef 1g IV q24h as not on an HD schedule- once schedule is determined, can switch to 2g IV qHD  Height: 5\' 6"  (167.6 cm) Weight: 120 lb 5.9 oz (54.6 kg) IBW/kg (Calculated) : 63.8  Temp (24hrs), Avg:97.5 F (36.4 C), Min:97 F (36.1 C), Max:98.4 F (36.9 C)   Recent Labs Lab 05/14/16 1551 05/14/16 1825 05/15/16 2020 05/15/16 2021 05/16/16 0945  WBC 8.1  --  8.1  --  5.7  CREATININE 2.24*  --   --  2.89* 2.12*  LATICACIDVEN  --  0.80  --   --   --     Estimated Creatinine Clearance: 23.6 mL/min (by C-G formula based on Cr of 2.12).    Allergies  Allergen Reactions  . Ampicillin Rash    Ankle swelling  . Benadryl [Diphenhydramine Hcl] Itching    Rash    Antimicrobials this admission: Vanc (PTA since 4/21) >> 5/31 Ancef 6/2>>  Dose adjustments this admission: n/a  Microbiology results: 5/31 BCID: klebsiella pneumoniae 5/31 BCx: 1/2 klebsiella pneumoniae 6/1 urine: insignificant growth 6/2 cath tip: sent 6/2 disc aspiration: sent  Thank you for allowing pharmacy to be a part of this patient's care.  Jamarri Vuncannon D. Wyndham Santilli, PharmD, BCPS Clinical Pharmacist Pager: [phone] 05/16/2016 4:37 PM

## 2016-05-16 NOTE — Consult Note (Signed)
Consultation  Referring Provider:   Dr. Gonzella Lex   Primary Care Physician:  Theressa Stamps, MD Primary Gastroenterologist:  None        Reason for Consultation: Dysphagia          HPI:   Jeffrey Walls is a 75 y.o. 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 We are consultated due to patient's new complaint of dysphagia.  At time of interview, patient is in dialysis, he is a very poor historian and only able to tell me that he "can't swallow anymore", in between falling asleep. The history is garnered from previous notes and evaluations.  It is described that the patient reports having dysphagia for the past 5 days. He did have evaluation by speech pathology on 05/15/2016 who explained that the patient believes solids were getting "stuck", even with pured foods like applesauce and the patient's throat. It was suspected the patient had a primary esophageal dysphagia. At that time he was placed on dysphagia 3 mechanical soft and thin liquids. An MRI has also been ordered for further evaluation of possible stroke.   History is gathered from previous notes due to mental status: Past Medical History  Diagnosis Date  . Renal disorder   . Hypertension   . Diabetes mellitus without complication (HCC)   . Coronary artery disease   . Shortness of breath dyspnea   . ANCA-associated vasculitis (HCC)   . 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 Av fistula placement Left 03/04/2016    Procedure: ARTERIOVENOUS (AV) FISTULA CREATION LEFT RADIOCEPHALIC;  Surgeon: Chuck Hint, MD;  Location: [facility] OR;   Service: Vascular;  Laterality: Left;  . Insertion of dialysis catheter N/A 04/06/2016    Procedure: INSERTION OF DIALYSIS CATHETER;  Surgeon: Sherren Kerns, MD;  Location: [facility] OR;  Service: Vascular;  Laterality: N/A;  . Tee without cardioversion N/A 04/08/2016    Procedure: TRANSESOPHAGEAL ECHOCARDIOGRAM (TEE);  Surgeon: Laurey Morale, MD;  Location: [facility] ENDOSCOPY;  Service: Cardiovascular;  Laterality: N/A;    History reviewed. No pertinent family history.   Social History  Substance Use Topics  . Smoking status: Never Smoker   . Smokeless tobacco: None  . Alcohol Use: No    Prior to Admission medications   Medication Sig Start Date End Date Taking? Authorizing Provider  acetaminophen (TYLENOL) 500 MG tablet Take 2 tablets (1,000 mg total) by mouth 3 (three) times daily. 04/11/16  Yes Shanker Levora Dredge, MD  ALPRAZolam Prudy Feeler) 0.25 MG tablet Take 1 tablet (0.25 mg total) by mouth 3 (three) times daily as needed for anxiety. 04/11/16  Yes Shanker Levora Dredge, MD  Amino Acids-Protein Hydrolys (FEEDING SUPPLEMENT, PRO-STAT SUGAR FREE 64,) LIQD Take 30 mLs by mouth 2 (two) times daily. 04/11/16  Yes Shanker Levora Dredge, MD  aspirin EC 81 MG tablet Take 81 mg by mouth daily.   Yes Historical Provider, MD  citalopram (CELEXA) 20 MG tablet Take 20 mg by mouth daily.   Yes Historical Provider, MD  feeding supplement (BOOST / RESOURCE BREEZE) LIQD Take 1 Container by mouth 2 (two) times daily between meals. 04/11/16  Yes Shanker Levora Dredge, MD  folic acid (FOLVITE) 1 MG tablet Take 1 mg by mouth daily. 08/30/15  Yes Historical Provider, MD  insulin aspart (NOVOLOG) 100 UNIT/ML injection 0-9 Units, Subcutaneous, 3 times daily with meals CBG < 70: implement hypoglycemia protocol CBG 70 - 120: 0 units CBG 121 - 150: 1 unit CBG 151 - 200: 2 units CBG 201 - 250: 3 units CBG 251 - 300: 5 units CBG 301 - 350: 7 units CBG 351 - 400: 9 units CBG > 400: call MD 04/11/16  Yes Shanker Levora Dredge, MD  lisinopril  (PRINIVIL,ZESTRIL) 10 MG tablet Take 1 tablet by mouth daily. 05/13/16  Yes Historical Provider, MD  lisinopril (PRINIVIL,ZESTRIL) 20 MG tablet Take 0.5 tablets (10 mg total) by mouth daily. 04/11/16  Yes Shanker Levora Dredge, MD  loratadine (CLARITIN) 10 MG tablet Take 10 mg by mouth daily.   Yes Historical Provider, MD  metoprolol succinate (TOPROL-XL) 25 MG 24 hr tablet Take 1 tablet (25 mg total) by mouth daily. 04/11/16  Yes Shanker Levora Dredge, MD  oxyCODONE (OXY IR/ROXICODONE) 5 MG immediate release tablet Take 1-2 tablets (5-10 mg total) by mouth every 6 (six) hours as needed for severe pain. 04/11/16  Yes Shanker Levora Dredge, MD  pantoprazole (PROTONIX) 20 MG tablet Take 20 mg by mouth daily. 08/18/15 08/17/16 Yes Historical Provider, MD  zolpidem (AMBIEN) 5 MG tablet Take 1 tablet (5 mg total) by mouth at bedtime as needed. Sleep 04/11/16  Yes Shanker Levora Dredge, MD  Vancomycin [facility]) 750-5 MG/150ML-% SOLN Inject 150 mLs (750 mg total) into the vein every Monday, Wednesday, and Friday with hemodialysis. 04/11/16   Shanker Levora Dredge, MD    Current Facility-Administered Medications  Medication Dose Route Frequency Provider Last Rate Last Dose  . ALPRAZolam (XANAX) tablet 0.25 mg  0.25 mg Oral BID PRN Nishant Dhungel, MD      . aspirin EC tablet 81 mg  81 mg Oral Daily Leatha Gilding, MD   81 mg at 05/16/16 0803  . citalopram (CELEXA) tablet 20 mg  20 mg Oral Daily Leatha Gilding, MD   20 mg at 05/16/16 0803  . feeding supplement (BOOST / RESOURCE BREEZE) liquid 1 Container  1 Container Oral TID BM Nishant Dhungel, MD   1 Container at 05/15/16 1415  . fentaNYL (SUBLIMAZE) 100 MCG/2ML injection           . fentaNYL (SUBLIMAZE) injection   Intravenous PRN Gilmer Mor, DO   25 mcg at 05/16/16 0847  . folic acid (FOLVITE) tablet 1 mg  1 mg Oral Daily Costin Otelia Sergeant, MD   1 mg at 05/16/16 0803  . insulin aspart (novoLOG) injection 0-9 Units  0-9 Units Subcutaneous TID WC Costin Otelia Sergeant, MD   0 Units  at 05/15/16 0800  . lisinopril (PRINIVIL,ZESTRIL) tablet 10 mg  10 mg Oral Daily Nishant Dhungel, MD   10 mg at 05/16/16 0803  . metoprolol succinate (TOPROL-XL) 24 hr tablet 25 mg  25 mg Oral Daily Nishant Dhungel, MD   25 mg at 05/16/16 0803  . midazolam (VERSED) 2 MG/2ML injection           . midazolam (VERSED) injection   Intravenous PRN Gilmer Mor, DO   1 mg at 05/16/16 0847  . oxyCODONE (Oxy IR/ROXICODONE) immediate release tablet 5 mg  5 mg Oral Q4H PRN Leatha Gilding, MD   5 mg at 05/16/16 0408  . pantoprazole (PROTONIX) EC tablet 20 mg  20 mg Oral Daily Costin  Otelia SergeantM Gherghe, MD   20 mg at 05/16/16 0803  . sodium chloride flush (NS) 0.9 % injection 3 mL  3 mL Intravenous Q12H Costin Otelia SergeantM Gherghe, MD   3 mL at 05/15/16 2200  . white petrolatum (VASELINE) gel             Allergies as of 05/14/2016 - Review Complete 05/14/2016  Allergen Reaction Noted  . Ampicillin Rash 12/25/2015  . Benadryl [diphenhydramine hcl] Itching 12/25/2015     Review of Systems:    Unable to obtain due to mental status    Physical Exam:  Vital signs in last 24 hours: Temp:  [97 F (36.1 C)-99.6 F (37.6 C)] 97.4 F (36.3 C) (06/02 0930) Pulse Rate:  [74-94] 76 (06/02 1120) Resp:  [11-20] 13 (06/02 0930) BP: (130-167)/(69-90) 136/77 mmHg (06/02 1120) SpO2:  [93 %-100 %] 100 % (06/02 0930) Weight:  [118 lb 6.2 oz (53.7 kg)-119 lb 14.9 oz (54.4 kg)] 119 lb 14.9 oz (54.4 kg) (06/02 0930) Last BM Date:  (pta) General:   Somnolent caucasian male appears to be in NAD, well developed, receiving hemodialysis Head:  Normocephalic and atraumatic. Eyes:    No icterus. Conjunctiva pink. Ears:  Normal auditory acuity. Neck:  Supple Lungs: Respirations even and unlabored. Lungs clear to auscultation bilaterally.   No wheezes, crackles, or rhonchi.  Heart: Normal S1, S2. No MRG. Regular rate and rhythm. Abdomen:  Soft, nondistended, nontender. No rebound or guarding. Normal bowel sounds. No appreciable masses or  hepatomegaly. No abdominal distension.  Rectal:  Not performed.  Msk:  Symmetrical without gross deformities.  Extremities:  Without edema, no deformity or joint abnormality.  Neurologic:  somnolent Skin:   Dry and intact without significant lesions or rashes. Psychiatric: Somnolent, unable to elicit history   LAB RESULTS:  Recent Labs  05/14/16 1551 05/15/16 2020 05/16/16 0945  WBC 8.1 8.1 5.7  HGB 10.1* 8.5* 9.0*  HCT 31.9* 26.8* 28.7*  PLT 174 146* 141*   BMET  Recent Labs  05/14/16 1551 05/15/16 2021 05/16/16 0945  NA 135 133* 136  K 2.9* 2.8* 3.0*  CL 97* 97* 101  CO2 28 27 29   GLUCOSE 86 87 75  BUN 11 21* 14  CREATININE 2.24* 2.89* 2.12*  CALCIUM 7.8* 7.7* 7.4*   LFT  Recent Labs  05/16/16 0945  ALBUMIN 1.9*   PT/INR  Recent Labs  05/16/16 0538  LABPROT 17.0*  INR 1.37    STUDIES: Ct Head Wo Contrast  05/14/2016  CLINICAL DATA:  Fall today. Altered mental status. Agitation/confusion. 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 EXAM: CT HEAD WITHOUT CONTRAST TECHNIQUE: Contiguous axial images were obtained from the base of the skull through the vertex without intravenous contrast. COMPARISON:  05/09/2016 FINDINGS: Small remote lacunar infarcts in the left lentiform nucleus. Otherwise, the brainstem, cerebellum, cerebral peduncles, thalami, basal ganglia, basilar cisterns, and ventricular system appear within normal limits. Periventricular white matter and corona radiata hypodensities favor chronic ischemic microvascular white matter disease. No intracranial hemorrhage, mass lesion, or acute CVA. IMPRESSION: 1. No acute intracranial findings. 2. Periventricular white matter and corona radiata hypodensities favor chronic ischemic microvascular white matter disease. 3. 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 Electronically Signed   By: Gaylyn RongWalter  Liebkemann M.D.   On: 05/14/2016 16:51   Ct Lumbar Spine Wo Contrast  05/14/2016  CLINICAL DATA:   Fall on Tuesday. Pain in the back. This is worsened. EXAM: CT LUMBAR SPINE WITHOUT CONTRAST TECHNIQUE: Multidetector CT imaging of  the lumbar spine was performed without intravenous contrast administration. Multiplanar CT image reconstructions were also generated. COMPARISON:  05/09/2016 FINDINGS: Acute 15% superior endplate compression fracture at T12 with endplate sclerosis and some cortical irregularity. No significant bony retropulsion. This is new compared to the MRI from 04/05/2016. Abnormal erosive findings along the inferior endplate of L4 and superior endplate of L5. These lack the characteristic sclerotic margin for Schmorl's nodes, and moreover there is some sclerosis and interval flattening along the anterior superior endplate of L5 compared to the 04/05/2016 exam. These heighten my concern that the findings at L4-5 are probably due to discitis -osteomyelitis. There is also prominence of the paraspinal tissues at this level deep to the psoas muscles, probably inflammatory. There is also a suggestion of degenerative disc disease at this level potentially causing foraminal impingement. Strictly speaking I cannot exclude epidural abscess given the slight epidural prominence in this vicinity and concern for discitis. Along the inferior endplate of L3 there is a more typical appearing Schmorl' s node, with sclerotic margin. No vertebral subluxation is observed. The is a right twelfth rib fracture posteriorly on image 39/2 L1. Bridging spurring of the left sacroiliac joint. Aortoiliac atherosclerotic vascular disease. Bilateral pleural effusions. Bilateral perirenal stranding. Diffuse subcutaneous and mesenteric edema. IMPRESSION: 1. Findings on today's exam are very concerning for discitis osteomyelitis at the L4-5 level, with bony destructive findings along the endplates without sclerosis, progressive collapse along the superior endplate of L5, inflammatory paravertebral findings, and I cannot exclude  epidural abscess. 2. There is also a new 15% superior endplate compression fracture T12 as well as a nondisplaced fracture of the right T12 rib posteriorly. 3. Bilateral pleural effusions are at least moderate in size but only partially characterized on today' s exam. 4. Perirenal, subcutaneous, and mesenteric edema. Electronically Signed   By: Gaylyn Rong M.D.   On: 05/14/2016 17:10   Mr Lumbar Spine Wo Contrast  05/14/2016  CLINICAL DATA:  Discitis on treatment for 1 month. Increased confusion and fever. Abnormal CT scan. EXAM: MRI LUMBAR SPINE WITHOUT CONTRAST TECHNIQUE: Multiplanar, multisequence MR imaging of the lumbar spine was performed. No intravenous contrast was administered. COMPARISON:  CT of the lumbar spine 05/14/2016. MRI of the lumbar spine 04/05/2016. FINDINGS: Segmentation: 5 non rib-bearing lumbar type vertebral bodies are present. Alignment: AP alignment is anatomic. Leftward curvature of the lumbar spine is centered at L4-5. Vertebrae: A superior endplate fracture at T12 is again seen, incompletely healed. Diffuse edematous changes are present at L4-5. Conus medullaris: Extends to the T12-L1 level and appears normal. Paraspinal and other soft tissues: Edematous paraspinous changes are present at L4-5 with extension into the psoas musculature. There is no discrete abscess. A 10 mm cyst is present at the lower pole of the left kidney. No other focal solid organ lesions are present. Disc levels: The disc levels at L3-4 above are normal. L3-4: A broad-based disc protrusion is present. Moderate facet hypertrophy is noted. This results in mild central and bilateral foraminal stenosis. L4-5: There is further destruction of endplates. Abnormal signal is present within the disc space and extending into the epidural space on the left. Disc fragment or epidural abscess extends superiorly along the posterior margin of the left L4 vertebral body 2.5 cm from the inferior endplate of L4. Moderate  left subarticular stenosis is present. Moderate left and mild right foraminal narrowing is present. L5-S1: Moderate facet hypertrophy is present bilaterally. There is mild subarticular narrowing on both sides. IMPRESSION: 1. Progressive discitis at  L4-5 with further destruction of the endplates and diffuse marrow edema. 2. Infectious or inflammatory material extends posteriorly from the disc space into the epidural space with superior extension 2.5 cm from the inferior endplate of L4 within the left lateral recess. 3. Contrast could not be given due to renal insufficiency and a GFR of 28. 4. Mild central and bilateral foraminal stenosis at L3-4. 5. Moderate left and mild right foraminal stenosis at L4-5. 6. Mild subarticular narrowing bilaterally at L5-S1 secondary to moderate facet hypertrophy and spurring. Electronically Signed   By: Marin Roberts M.D.   On: 05/14/2016 21:40     PREVIOUS ENDOSCOPIES:            No previous   Impression / Plan:  Impression: 1. Dysphagia: Per report 5 days of difficulty swallowing, the patient was able to swallow his medications without any problem. Swallow evaluation suspected primary esophageal dysphagia. MRI ordered for further evaluation of possible stroke due to acute onset. Consider esophageal ring versus web first mass or stricture versus stroke 2. ESRD on dialysis-hospitalist following 3. Acute encephalopathy: Secondary to sepsis 2 to lumbar discitis/osteomyelitis and epidural abscess- IR following  Plan: 1. At this time, it appears patient is still able to have PO intake per hospitalist's notes, would recommended continued dysphagia 3 diet and barium esophagram for further evaluation of possible structural abnormality, we will await results for further recommendations 2. Discussed above with Dr. Russella Dar, please await any further recommendations  Thank you for your kind consultation, we will continue to follow.  Violet Baldy [facility]  05/16/2016, 11:52  AM     Attending physician's note   I have taken a history, examined the patient and reviewed the chart. I agree with the Advanced Practitioner's note, impression and recommendations.  He complains of dysphagia to certain foods however denies problems with liquids or pills. He notes a very dry and sore mouth which he thinks might be causing his swallowing problems. Very dry MM in oral cavity and suspected oral candidiasis.  SLP beside evaluation did not show any oropharyngeal dysfunction.  Barium esophagram today was a limited study however no esophageal abnormalities were noted.  Treat for suspected oral and possible esophageal candidiasis, attention to oral hygiene and liberalize po intake if possible to help with dry mouth. If dysphagia persists recommend obtaining a MBSS and then repeating a barium esophagram. Please reconsult GI if above measures are not helpful.   Claudette Head, MD Clementeen Graham [phone] Mon-Fri 8a-5p [phone] after 5p, weekends, holidays

## 2016-05-16 NOTE — Progress Notes (Signed)
  [facility] Catheter Removal Procedure Note  Diagnosis: ESRD  Plan:  Remove left IJ tunneled dialysis catheter  Consent signed:  Yes.   Time out completed:  Yes.   Coumadin:  No.   Procedure: 1.  The left chest was prepped with betadine. 2. 5 ml 2% lidocaine plain instilled at removal site. 3.  left catheter removed in its entirety with cuff in tact. 4.  Complications:  none 5. Tip of catheter sent for culture:  Yes.     Patient tolerated procedure well:  Yes.   Pressure held, no bleeding noted, dressing applied Instructions given to the pt regarding wound care and bleeding.    Maris BergerKimberly Journe Hallmark, PA-C 05/16/2016 4:09 PM

## 2016-05-16 NOTE — Progress Notes (Signed)
PROGRESS NOTE                                                                                                                                                                                                             Patient Demographics:    Jeffrey Walls, is a 75 y.o. male, DOB - 10/26/1941, ZOX:[id number]  Admit date - 05/14/2016   Admitting Physician Costin Otelia Sergeant, MD  Outpatient Primary MD for the patient is Theressa Stamps, MD  LOS - 2  Outpatient Specialists: Renal Dr. Algis Liming  Chief Complaint  Patient presents with  . Back Pain       Brief Narrative   75 year old male with history of ESRD on dialysis, combined systolic and diastolic CHF, recent prolonged hospitalization at [facility] for coag-negative staph bacteremia, source felt to be from his dialysis catheter which was replaced and placed on IV vancomycin for 6 weeks course (starting 04/04/2016). 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 Patient presented to the ED with low back pain and acute encephalopathy. Due to his progressive back pain he was unable to complete hemodialysis fairly. In the ED was septic with fever of 101F and mildly tachycardic. Blood work showed hemoglobin of 10.1, potassium of 2.9 and creatinine of 2.24. Lactic acid was normal. Head CT was unremarkable. CT of the lumbar spine was concerning for discitis and osteomyelitis at the L4-5 level with bony destructive changes and unable to exclude epidural abscess. An MRI of the lumbar spine was done which showed progressive discitis at the L4-5 with destruction of endplates and diffuse marrow edema. Also showed infectious/inflammatory material extending posteriorly into the epidural space from the inferior endplate of L4 within the left lateral recess.  Patient admitted to hospitalist service. Neurosurgery and ID  consulted.   Subjective:   Patient seen during dialysis. Continues to be confused.   Assessment  & Plan :   Principal problem Sepsis secondary to lumbar discitis/osteomyelitis and epidural abscess Holding vancomycin to obtain better yield on biopsy.  ID following an appreciate recommendations. Seen by neurosurgeon Dr. Wynetta Emery who recommended to continue with aggressive medical management and given lack of focal neurological deficit does not need surgical intervention at this time. Repeat blood cultures sent (both periphery and central line). Sepsis pathway  initiated on admission and received IV bolus. On oxycodone for pain. Minimize use as possible given his encephalopathy.   Active Problems: Acute encephalopathy Suspect in the setting of sepsis. However given his dysphagia and generalized weakness . MRI of the brain ordered. PT evaluation. Minimize narcotic use. Reduce frequency of Xanax.  Acute dysphagia Reports symptoms for the past 5 days with difficulty swallowing mostly solid and having episodes of regurgitation and vomiting. Was able to swallow his medications without any problem. Seen by swallow evaluation and recommended dysphagia level III diet. Ordered esophagogram. [facility] consulted.  ESRD on M, W, F Getting scheduled dialysis here. Renal following    Anemia of renal disease At baseline.    Essential hypertension Stable on home medications.     Diabetes mellitus with diabetic nephropathy, with long-term current use of insulin (HCC) Stable. Monitor on sliding scale coverage.    Protein-calorie malnutrition, severe Nutrition consult.     Bacteremia, coagulase-negative staphylococcal Getting 6 weeks course of IV vancomycin with dialysis since 04/04/2016. TEE showed right atrial mass and was planned for repeat echo after completion of antibiotics. Repeat echo ordered.  Hypokalemia correct with HD      Code Status : full code  Family Communication  : will  update daughter  Disposition Plan  : pending workup and further decision on antibiotic course. Would likely need skilled nursing facility.  Barriers For Discharge : active symptoms. W/up pending  Consults  :   Renal ID Neurosurgery (Dr Wynetta Emeryram) IR   Procedures  :  CT head  CT lumbar spine  MRI lumbar spine L4-L5 disc aspiration  DVT Prophylaxis  :   Sq heparin  Lab Results  Component Value Date   PLT 141* 05/16/2016    Antibiotics  :    Anti-infectives    Start     Dose/Rate Route Frequency Ordered Stop   05/16/16 1200  vancomycin (VANCOCIN) 500 mg in sodium chloride 0.9 % 100 mL IVPB  Status:  Discontinued     500 mg 100 mL/hr over 60 Minutes Intravenous Every M-W-F (Hemodialysis) 05/14/16 1907 05/15/16 1506   05/14/16 1745  vancomycin (VANCOCIN) IVPB 1000 mg/200 mL premix     1,000 mg 200 mL/hr over 60 Minutes Intravenous  Once 05/14/16 1736 05/14/16 1914        Objective:   Filed Vitals:   05/16/16 1120 05/16/16 1150 05/16/16 1220 05/16/16 1311  BP: 136/77 134/74 146/81 146/79  Pulse: 76 75 77 79  Temp:    98.4 F (36.9 C)  TempSrc:    Oral  Resp:    15  Height:      Weight:    54.6 kg (120 lb 5.9 oz)  SpO2:    100%    Wt Readings from Last 3 Encounters:  05/16/16 54.6 kg (120 lb 5.9 oz)  04/11/16 51.8 kg (114 lb 3.2 oz)  03/05/16 57.1 kg (125 lb 14.1 oz)     Intake/Output Summary (Last 24 hours) at 05/16/16 1407 Last data filed at 05/16/16 1311  Gross per 24 hour  Intake    240 ml  Output    145 ml  Net     95 ml     Physical Exam  Gen: not in distress, fatigued and confused HEENT: moist mucosa, supple neck Chest: clear b/l, no added sounds CVS: N S1&S2, no murmurs, rubs or gallop GI: soft, NT, ND, BS+ Musculoskeletal: warm, no edema,  CNS: AAOX1-2, confused    Data Review:  CBC  Recent Labs Lab 05/14/16 1551 05/15/16 2020 05/16/16 0945  WBC 8.1 8.1 5.7  HGB 10.1* 8.5* 9.0*  HCT 31.9* 26.8* 28.7*  PLT 174 146* 141*    MCV 88.9 89.3 92.0  MCH 28.1 28.3 28.8  MCHC 31.7 31.7 31.4  RDW 16.6* 16.6* 16.6*  LYMPHSABS 0.6* 1.0  --   MONOABS 0.6 0.9  --   EOSABS 0.0 0.0  --   BASOSABS 0.0 0.0  --     Chemistries   Recent Labs Lab 05/14/16 1551 05/15/16 2021 05/16/16 0945  NA 135 133* 136  K 2.9* 2.8* 3.0*  CL 97* 97* 101  CO2 GLUCOSE 86 87 75  BUN 11 21* 14  CREATININE 2.24* 2.89* 2.12*  CALCIUM 7.8* 7.7* 7.4*   ------------------------------------------------------------------------------------------------------------------ No results for input(s): CHOL, HDL, LDLCALC, TRIG, CHOLHDL, LDLDIRECT in the last 72 hours.  Lab Results  Component Value Date   HGBA1C 5.5 03/31/2016   ------------------------------------------------------------------------------------------------------------------ No results for input(s): TSH, T4TOTAL, T3FREE, THYROIDAB in the last 72 hours.  Invalid input(s): FREET3 ------------------------------------------------------------------------------------------------------------------ No results for input(s): VITAMINB12, FOLATE, FERRITIN, TIBC, IRON, RETICCTPCT in the last 72 hours.  Coagulation profile  Recent Labs Lab 05/16/16 0538  INR 1.37    No results for input(s): DDIMER in the last 72 hours.  Cardiac Enzymes No results for input(s): CKMB, TROPONINI, MYOGLOBIN in the last 168 hours.  Invalid input(s): CK ------------------------------------------------------------------------------------------------------------------    Component Value Date/Time   BNP >4500.0* 02/28/2016 2200    Inpatient Medications  Scheduled Meds: . aspirin EC  81 mg Oral Daily  . citalopram  20 mg Oral Daily  . feeding supplement  1 Container Oral TID BM  . fentaNYL      . folic acid  1 mg Oral Daily  . insulin aspart  0-9 Units Subcutaneous TID WC  . lisinopril  10 mg Oral Daily  . metoprolol succinate  25 mg Oral Daily  . midazolam      . pantoprazole  20 mg  Oral Daily  . sodium chloride flush  3 mL Intravenous Q12H  . white petrolatum       Continuous Infusions:  PRN Meds:.ALPRAZolam, fentaNYL, lidocaine, midazolam, oxyCODONE  Micro Results Recent Results (from the past 240 hour(s))  Blood culture (routine x 2)     Status: Abnormal (Preliminary result)   Collection Time: 05/14/16  4:10 PM  Result Value Ref Range Status   Specimen Description BLOOD LEFT ANTECUBITAL  Final   Special Requests BOTTLES DRAWN AEROBIC AND ANAEROBIC 5CC  Final   Culture  Setup Time   Final    GRAM NEGATIVE RODS AEROBIC BOTTLE ONLY CRITICAL RESULT CALLED TO, READ BACK BY AND VERIFIED WITH: L. Bajbus Pharm.D. 16:20 05/15/16 ([email or web address])    Culture KLEBSIELLA PNEUMONIAE (A)  Final   Report Status PENDING  Incomplete  Blood Culture ID Panel (Reflexed)     Status: Abnormal   Collection Time: 05/14/16  4:10 PM  Result Value Ref Range Status   Enterococcus species NOT DETECTED NOT DETECTED Final   Vancomycin resistance NOT DETECTED NOT DETECTED Final   Listeria monocytogenes NOT DETECTED NOT DETECTED Final   Staphylococcus species NOT DETECTED NOT DETECTED Final   Staphylococcus aureus NOT DETECTED NOT DETECTED Final   Methicillin resistance NOT DETECTED NOT DETECTED Final   Streptococcus species NOT DETECTED NOT DETECTED Final   Streptococcus agalactiae NOT DETECTED NOT DETECTED Final   Streptococcus pneumoniae NOT DETECTED NOT DETECTED Final   Streptococcus  pyogenes NOT DETECTED NOT DETECTED Final   Acinetobacter baumannii NOT DETECTED NOT DETECTED Final   Enterobacteriaceae species DETECTED (A) NOT DETECTED Final    Comment: CRITICAL RESULT CALLED TO, READ BACK BY AND VERIFIED WITH: L. Bajbus Pharm.D. 16:20 05/15/16 ([email or web address])    Enterobacter cloacae complex NOT DETECTED NOT DETECTED Final   Escherichia coli NOT DETECTED NOT DETECTED Final   Klebsiella oxytoca NOT DETECTED NOT DETECTED Final   Klebsiella pneumoniae DETECTED (A) NOT DETECTED Final    Comment:  CRITICAL RESULT CALLED TO, READ BACK BY AND VERIFIED WITH: L. Bajbus Pharm.D. 16:20 05/15/16 ([email or web address])    Proteus species NOT DETECTED NOT DETECTED Final   Serratia marcescens NOT DETECTED NOT DETECTED Final   Carbapenem resistance NOT DETECTED NOT DETECTED Final   Haemophilus influenzae NOT DETECTED NOT DETECTED Final   Neisseria meningitidis NOT DETECTED NOT DETECTED Final   Pseudomonas aeruginosa NOT DETECTED NOT DETECTED Final   Candida albicans NOT DETECTED NOT DETECTED Final   Candida glabrata NOT DETECTED NOT DETECTED Final   Candida krusei NOT DETECTED NOT DETECTED Final   Candida parapsilosis NOT DETECTED NOT DETECTED Final   Candida tropicalis NOT DETECTED NOT DETECTED Final  Blood culture (routine x 2)     Status: None (Preliminary result)   Collection Time: 05/14/16  6:00 PM  Result Value Ref Range Status   Specimen Description BLOOD RIGHT HAND  Final   Special Requests BOTTLES DRAWN AEROBIC AND ANAEROBIC 10CC  Final   Culture NO GROWTH < 24 HOURS  Final   Report Status PENDING  Incomplete  Urine culture     Status: Abnormal   Collection Time: 05/15/16  4:39 AM  Result Value Ref Range Status   Specimen Description URINE, RANDOM  Final   Special Requests NONE  Final   Culture <10,000 COLONIES/mL INSIGNIFICANT GROWTH (A)  Final   Report Status 05/16/2016 FINAL  Final    Radiology Reports Ct Head Wo Contrast  05/14/2016  CLINICAL DATA:  Fall today. Altered mental status. Agitation/confusion. 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 EXAM: CT HEAD WITHOUT CONTRAST TECHNIQUE: Contiguous axial images were obtained from the base of the skull through the vertex without intravenous contrast. COMPARISON:  05/09/2016 FINDINGS: Small remote lacunar infarcts in the left lentiform nucleus. Otherwise, the brainstem, cerebellum, cerebral peduncles, thalami, basal ganglia, basilar cisterns, and ventricular system appear within normal limits. Periventricular white matter and corona radiata hypodensities  favor chronic ischemic microvascular white matter disease. No intracranial hemorrhage, mass lesion, or acute CVA. IMPRESSION: 1. No acute intracranial findings. 2. Periventricular white matter and corona radiata hypodensities favor chronic ischemic microvascular white matter disease. 3. 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 Electronically Signed   By: Gaylyn Rong M.D.   On: 05/14/2016 16:51   Ct Lumbar Spine Wo Contrast  05/14/2016  CLINICAL DATA:  Fall on Tuesday. Pain in the back. This is worsened. EXAM: CT LUMBAR SPINE WITHOUT CONTRAST TECHNIQUE: Multidetector CT imaging of the lumbar spine was performed without intravenous contrast administration. Multiplanar CT image reconstructions were also generated. COMPARISON:  05/09/2016 FINDINGS: Acute 15% superior endplate compression fracture at T12 with endplate sclerosis and some cortical irregularity. No significant bony retropulsion. This is new compared to the MRI from 04/05/2016. Abnormal erosive findings along the inferior endplate of L4 and superior endplate of L5. These lack the characteristic sclerotic margin for Schmorl's nodes, and moreover there is some sclerosis and interval flattening along the anterior superior endplate of L5 compared to the 04/05/2016 exam. These  heighten my concern that the findings at L4-5 are probably due to discitis -osteomyelitis. There is also prominence of the paraspinal tissues at this level deep to the psoas muscles, probably inflammatory. There is also a suggestion of degenerative disc disease at this level potentially causing foraminal impingement. Strictly speaking I cannot exclude epidural abscess given the slight epidural prominence in this vicinity and concern for discitis. Along the inferior endplate of L3 there is a more typical appearing Schmorl' s node, with sclerotic margin. No vertebral subluxation is observed. The is a right twelfth rib fracture posteriorly on image 39/2  L1. Bridging spurring of the left sacroiliac joint. Aortoiliac atherosclerotic vascular disease. Bilateral pleural effusions. Bilateral perirenal stranding. Diffuse subcutaneous and mesenteric edema. IMPRESSION: 1. 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 2. There is also a new 15% superior endplate compression fracture T12 as well as a nondisplaced fracture of the right T12 rib posteriorly. 3. Bilateral pleural effusions are at least moderate in size but only partially characterized on today' s exam. 4. Perirenal, subcutaneous, and mesenteric edema. Electronically Signed   By: Gaylyn Rong M.D.   On: 05/14/2016 17:10   Mr Lumbar Spine Wo Contrast  05/14/2016  CLINICAL DATA:  Discitis on treatment for 1 month. Increased confusion and fever. Abnormal CT scan. EXAM: MRI LUMBAR SPINE WITHOUT CONTRAST TECHNIQUE: Multiplanar, multisequence MR imaging of the lumbar spine was performed. No intravenous contrast was administered. COMPARISON:  CT of the lumbar spine 05/14/2016. MRI of the lumbar spine 04/05/2016. FINDINGS: Segmentation: 5 non rib-bearing lumbar type vertebral bodies are present. Alignment: AP alignment is anatomic. Leftward curvature of the lumbar spine is centered at L4-5. Vertebrae: A superior endplate fracture at T12 is again seen, incompletely healed. Diffuse edematous changes are present at L4-5. Conus medullaris: Extends to the T12-L1 level and appears normal. Paraspinal and other soft tissues: Edematous paraspinous changes are present at L4-5 with extension into the psoas musculature. There is no discrete abscess. A 10 mm cyst is present at the lower pole of the left kidney. No other focal solid organ lesions are present. Disc levels: The disc  levels at L3-4 above are normal. L3-4: A broad-based disc protrusion is present. Moderate facet hypertrophy is noted. This results in mild central and bilateral foraminal stenosis. L4-5: There is further destruction of endplates. Abnormal signal is present within the disc space and extending into the epidural space on the left. Disc fragment or epidural abscess extends superiorly along the posterior margin of the left L4 vertebral body 2.5 cm from the inferior endplate of L4. Moderate left subarticular stenosis is present. Moderate left and mild right foraminal narrowing is present. L5-S1: Moderate facet hypertrophy is present bilaterally. There is mild subarticular narrowing on both sides. IMPRESSION: 1. 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 2. Infectious or inflammatory material extends posteriorly from the disc space into the epidural space with superior extension 2.5 cm from the inferior endplate of L4 within the left lateral recess. 3. Contrast could not be given due to renal insufficiency and a GFR of 28. 4. Mild central and bilateral foraminal stenosis at L3-4. 5. Moderate left and mild right foraminal stenosis at L4-5. 6. Mild subarticular narrowing bilaterally at L5-S1 secondary to moderate facet hypertrophy and spurring. Electronically Signed  By: Marin Roberts M.D.   On: 05/14/2016 21:40   Ir Fluoro Guide Ndl Plmt / Bx  05/16/2016  INDICATION: 75 year old male with a history of L4-L5 discitis/osteomyelitis. He has been referred for an aspiration for culture. EXAM: IR FLUORO GUIDE NEEDLE PLACEMENT /BIOPSY MEDICATIONS: None. ANESTHESIA/SEDATION: Moderate (conscious) sedation was employed during this procedure. A total of Versed 1.0 mg and Fentanyl 50 mcg was administered intravenously. Moderate Sedation Time: 20 minutes. The patient's level of consciousness and vital signs were monitored continuously by radiology nursing throughout the  procedure under my direct supervision. FLUOROSCOPY TIME:  Fluoroscopy Time: 1 minutes 48 seconds (6 mGy). COMPLICATIONS: None PROCEDURE: Informed written consent was obtained from the patient after a thorough discussion of the procedural risks, benefits and alternatives. All questions were addressed. Maximal Sterile Barrier Technique was utilized including caps, mask, sterile gowns, sterile gloves, sterile drape, hand hygiene and skin antiseptic. A timeout was performed prior to the initiation of the procedure. Patient is position prone position on the fluoroscopy table. The lumbar region was prepped and draped in the usual sterile fashion. Once an oblique angle approach from the left posterior oblique was determined, the skin and subcutaneous tissues were generously infiltrated with 1% lidocaine for local anesthesia. A 20 cm 21 gauge Franceen needle was advanced under fluoroscopy into the disc space at L4-L5. Orthogonal images were acquired. Aspirate was achieved. Patient tolerated the procedure well and remained hemodynamically stable throughout. No complications were encountered and no significant blood loss encountered. IMPRESSION: Status post fluoroscopic guided disc aspirate at the L4-L5 level. Sample sent the lab for analysis. Signed, Yvone Neu. Loreta Ave, DO [facility] Radiology Electronically Signed   By: Gilmer Mor D.O.   On: 05/16/2016 13:34    Time Spent in minutes  25   Eddie North M.D on 05/16/2016 at 2:07 PM  Between 7am to 7pm - Pager - [phone]  After 7pm go to [email or web address] - password [email or web address]  [facility] -  Office  [phone]

## 2016-05-16 NOTE — Progress Notes (Addendum)
  [facility] Progress Note   Subjective: in a better mood today, responsive, wants to get OOB, eat, etc.  Blood cx's + for GNR's.    Filed Vitals:   05/16/16 1050 05/16/16 1120 05/16/16 1150 05/16/16 1220  BP: 130/69 136/77 134/74 146/81  Pulse: 74 76 75 77  Temp:      TempSrc:      Resp:      Height:      Weight:      SpO2:        Inpatient medications: . aspirin EC  81 mg Oral Daily  . citalopram  20 mg Oral Daily  . feeding supplement  1 Container Oral TID BM  . fentaNYL      . folic acid  1 mg Oral Daily  . insulin aspart  0-9 Units Subcutaneous TID WC  . lisinopril  10 mg Oral Daily  . metoprolol succinate  25 mg Oral Daily  . midazolam      . pantoprazole  20 mg Oral Daily  . sodium chloride flush  3 mL Intravenous Q12H  . white petrolatum         ALPRAZolam, fentaNYL, midazolam, oxyCODONE  Exam: More alert and cheerful today NO jvd Chest clear bilat RRR soft SEM no RG Abd soft ntnd no ascites No LE edema L chest TDC clean exit site L FA AVF clean wounds, +bruit Neuro nonfocal  Dialysis: MWF [facility] 56kg 2/2.25 bath Hep none LUA AVF (maturing, placed 03/04/16)/ R IJ cath 4 hr      Assessment: 1  Fever/ worsening back pain- for disc aspiration today 2  GNR bacteremia - will have HD cath removed today by [facility] after HD, have d/w ID 3  ESRD HD MWF. Not sure AVF is ready to use yet, placed 3/21.  4  MBD cont meds 5  Anemia cont meds 6  Recent admit April w MRSE cath sepsis, L 4/5 discitis/osteo, T12 fx and 2x3cm mass RA free wall 7  HTN cont meds  Plan - as above   Vinson Moselleob Markeisha Mancias MD [facility] pager [phone]    cell [phone] 05/16/2016, 12:52 PM    Recent Labs Lab 05/14/16 1551 05/15/16 2021 05/16/16 0945  NA 135 133* 136  K 2.9* 2.8* 3.0*  CL 97* 97* 101  CO2 28 27 29   GLUCOSE 86 87 75  BUN 11 21* 14  CREATININE 2.24* 2.89* 2.12*  CALCIUM 7.8* 7.7* 7.4*  PHOS  --  3.7 2.7    Recent Labs Lab  05/15/16 2021 05/16/16 0945  ALBUMIN 2.0* 1.9*    Recent Labs Lab 05/14/16 1551 05/15/16 2020 05/16/16 0945  WBC 8.1 8.1 5.7  NEUTROABS 7.0 6.2  --   HGB 10.1* 8.5* 9.0*  HCT 31.9* 26.8* 28.7*  MCV 88.9 89.3 92.0  PLT 174 146* 141*

## 2016-05-16 NOTE — Progress Notes (Signed)
[facility] H&P  HPI:  This is a 75 y.o. male who we've been asked to remove his left IJ tunneled dialysis catheter due to positive blood cultures. His catheter was placed on 04/06/16.    Past Medical History  Diagnosis Date  . Renal disorder   . Hypertension   . Diabetes mellitus without complication (HCC)   . Coronary artery disease   . Shortness of breath dyspnea   . ANCA-associated vasculitis (HCC)   . Pneumocystis jiroveci pneumonia (HCC)     FH:  Non-Contributory  Social History   Social History  . Marital Status: Divorced    Spouse Name: N/A  . Number of Children: N/A  . Years of Education: N/A   Occupational History  . Not on file.   Social History Main Topics  . Smoking status: Never Smoker   . Smokeless tobacco: Not on file  . Alcohol Use: No  . Drug Use: No  . Sexual Activity: Not on file   Other Topics Concern  . Not on file   Social History Narrative    Allergies  Allergen Reactions  . Ampicillin Rash    Ankle swelling  . Benadryl [Diphenhydramine Hcl] Itching    Rash    Current Facility-Administered Medications  Medication Dose Route Frequency Provider Last Rate Last Dose  . ALPRAZolam (XANAX) tablet 0.25 mg  0.25 mg Oral BID PRN Nishant Dhungel, MD      . aspirin EC tablet 81 mg  81 mg Oral Daily Leatha Gilding, MD   81 mg at 05/16/16 0803  . citalopram (CELEXA) tablet 20 mg  20 mg Oral Daily Leatha Gilding, MD   20 mg at 05/16/16 0803  . feeding supplement (BOOST / RESOURCE BREEZE) liquid 1 Container  1 Container Oral TID BM Nishant Dhungel, MD   1 Container at 05/15/16 1415  . fentaNYL (SUBLIMAZE) 100 MCG/2ML injection           . fentaNYL (SUBLIMAZE) injection   Intravenous PRN Gilmer Mor, DO   25 mcg at 05/16/16 0847  . folic acid (FOLVITE) tablet 1 mg  1 mg Oral Daily Costin Otelia Sergeant, MD   1 mg at 05/16/16 0803  . insulin aspart (novoLOG) injection 0-9 Units  0-9 Units Subcutaneous TID WC Costin Otelia Sergeant, MD    0 Units at 05/15/16 0800  . lidocaine (XYLOCAINE) 2 % injection 0-20 mL  0-20 mL Intradermal Once PRN Samantha J Rhyne, PA-C      . lisinopril (PRINIVIL,ZESTRIL) tablet 10 mg  10 mg Oral Daily Nishant Dhungel, MD   10 mg at 05/16/16 0803  . metoprolol succinate (TOPROL-XL) 24 hr tablet 25 mg  25 mg Oral Daily Nishant Dhungel, MD   25 mg at 05/16/16 0803  . midazolam (VERSED) 2 MG/2ML injection           . midazolam (VERSED) injection   Intravenous PRN Gilmer Mor, DO   1 mg at 05/16/16 0847  . oxyCODONE (Oxy IR/ROXICODONE) immediate release tablet 5 mg  5 mg Oral Q4H PRN Leatha Gilding, MD   5 mg at 05/16/16 0408  . pantoprazole (PROTONIX) EC tablet 20 mg  20 mg Oral Daily Leatha Gilding, MD   20 mg at 05/16/16 0803  . sodium chloride flush (NS) 0.9 % injection 3 mL  3 mL Intravenous Q12H Costin Otelia Sergeant, MD   3 mL at 05/15/16 2200  . white petrolatum (VASELINE) gel  ROS:  See HPI  PHYSICAL EXAM  Filed Vitals:   05/16/16 1220 05/16/16 1311  BP: 146/81 146/79  Pulse: 77 79  Temp:  98.4 F (36.9 C)  Resp:  15    Gen:  Thin male in NAD HEENT:  normocephalic Neck:  Left IJ TDC  Lungs:  Non-labored Skin:  No obvious rashes Neuro:  No focal deficits  Impression: This is a 75 y.o. male with positive blood cultures in need of catheter removal  Plan:  Removal of left IJ tunneled dialysis catheter after HD  Maris BergerKimberly Kailer Heindel, PA-C [facility] [phone] 05/16/2016 4:06 PM

## 2016-05-16 NOTE — Procedures (Signed)
Interventional Radiology Procedure Note  Procedure: Fluoro guided L4-L5 disc aspiration.  Cx sample to lab  Complications: None Recommendations:  - Follow up lab - Do not submerge for 7 days - Routine wound care   Signed,  Yvone NeuJaime S. Loreta AveWagner, DO

## 2016-05-17 ENCOUNTER — Inpatient Hospital Stay (HOSPITAL_COMMUNITY): Payer: Medicare HMO

## 2016-05-17 DIAGNOSIS — T827XXA Infection and inflammatory reaction due to other cardiac and vascular devices, implants and grafts, initial encounter: Secondary | ICD-10-CM | POA: Insufficient documentation

## 2016-05-17 DIAGNOSIS — R509 Fever, unspecified: Secondary | ICD-10-CM

## 2016-05-17 DIAGNOSIS — M4646 Discitis, unspecified, lumbar region: Secondary | ICD-10-CM | POA: Insufficient documentation

## 2016-05-17 DIAGNOSIS — R7881 Bacteremia: Secondary | ICD-10-CM

## 2016-05-17 LAB — GLUCOSE, CAPILLARY
GLUCOSE-CAPILLARY: 99 mg/dL (ref 65–99)
GLUCOSE-CAPILLARY: 107 mg/dL — AB (ref 65–99)
GLUCOSE-CAPILLARY: 83 mg/dL (ref 65–99)
Glucose-Capillary: 81 mg/dL (ref 65–99)
Glucose-Capillary: 94 mg/dL (ref 65–99)

## 2016-05-17 LAB — HEPATITIS B SURFACE ANTIBODY,QUALITATIVE: Hep B S Ab: NONREACTIVE

## 2016-05-17 LAB — CULTURE, BLOOD (ROUTINE X 2)

## 2016-05-17 LAB — HEPATITIS B CORE ANTIBODY, TOTAL: Hep B Core Total Ab: NEGATIVE

## 2016-05-17 LAB — ECHOCARDIOGRAM COMPLETE
Height: 66 in
WEIGHTICAEL: 1925.94 [oz_av]

## 2016-05-17 NOTE — Progress Notes (Signed)
Subjective:  C/o severe back pain and not getting pain meds in timely manner  Antibiotics:  Anti-infectives    Start     Dose/Rate Route Frequency Ordered Stop   05/16/16 1815  fluconazole (DIFLUCAN) tablet 100 mg     100 mg Oral Daily 05/16/16 1809 05/23/16 0959   05/16/16 1800  ceFAZolin (ANCEF) IVPB 1 g/50 mL premix     1 g 100 mL/hr over 30 Minutes Intravenous Every 24 hours 05/16/16 1634     05/16/16 1200  vancomycin (VANCOCIN) 500 mg in sodium chloride 0.9 % 100 mL IVPB  Status:  Discontinued     500 mg 100 mL/hr over 60 Minutes Intravenous Every M-W-F (Hemodialysis) 05/14/16 1907 05/15/16 1506   05/14/16 1745  vancomycin (VANCOCIN) IVPB 1000 mg/200 mL premix     1,000 mg 200 mL/hr over 60 Minutes Intravenous  Once 05/14/16 1736 05/14/16 1914      Medications: Scheduled Meds: . aspirin EC  81 mg Oral Daily  .  ceFAZolin (ANCEF) IV  1 g Intravenous Q24H  . citalopram  20 mg Oral Daily  . feeding supplement  1 Container Oral TID BM  . fluconazole  100 mg Oral Daily  . folic acid  1 mg Oral Daily  . insulin aspart  0-9 Units Subcutaneous TID WC  . lisinopril  10 mg Oral Daily  . metoprolol succinate  25 mg Oral Daily  . nystatin  5 mL Oral QID  . pantoprazole  20 mg Oral Daily  . sodium chloride flush  3 mL Intravenous Q12H   Continuous Infusions:  PRN Meds:.ALPRAZolam, fentaNYL, lidocaine, midazolam, oxyCODONE    Objective: Weight change: 1 lb 8.7 oz (0.7 kg)  Intake/Output Summary (Last 24 hours) at 05/17/16 1343 Last data filed at 05/17/16 0916  Gross per 24 hour  Intake      3 ml  Output      0 ml  Net      3 ml   Blood pressure 153/71, pulse 75, temperature 98.3 F (36.8 C), temperature source Oral, resp. rate 16, height  (1.676 m), weight 120 lb 5.9 oz (54.6 kg), SpO2 100 %. Temp:  [98.3 F (36.8 C)] 98.3 F (36.8 C) (06/03 0526) Pulse Rate:  [75] 75 (06/03 0526) Resp:  [16-17] 16 (06/03 0526) BP: (148-153)/(69-71) 153/71 mmHg  (06/03 0526) SpO2:  [97 %-100 %] 100 % (06/03 0526)  Physical Exam: General: Alert and awake, oriented x3, not in any acute distress. HEENT: anicteric sclera, pupils reactive to light and accommodation, EOMI CVS regular rate, normal r,  no murmur rubs or gallops Chest: clear to auscultation bilaterally, no wheezing, rales or rhonchi Abdomen: soft nontender, nondistended, normal bowel sounds, Extremities: no  clubbing or edema noted bilaterally Neuro: nonfocal  CBC: (wbc3,Hgb:3,Hct:3,Plt:3,INR:3APTT:3)@   BMET  Recent Labs  05/15/16 2021 05/16/16 0945  NA 133* 136  K 2.8* 3.0*  CL 97* 101  CO2 27 29  GLUCOSE 87 75  BUN 21* 14  CREATININE 2.89* 2.12*  CALCIUM 7.7* 7.4*     Liver Panel   Recent Labs  05/15/16 2021 05/16/16 0945  ALBUMIN 2.0* 1.9*       Sedimentation Rate No results for input(s): ESRSEDRATE in the last 72 hours. C-Reactive Protein No results for input(s): CRP in the last 72 hours.  Micro Results: Recent Results (from the past 720 hour(s))  Blood culture (routine x 2)     Status: Abnormal  Collection Time: 05/14/16  4:10 PM  Result Value Ref Range Status   Specimen Description BLOOD LEFT ANTECUBITAL  Final   Special Requests BOTTLES DRAWN AEROBIC AND ANAEROBIC 5CC  Final   Culture  Setup Time   Final    GRAM NEGATIVE RODS AEROBIC BOTTLE ONLY CRITICAL RESULT CALLED TO, READ BACK BY AND VERIFIED WITH: L. Bajbus Pharm.D. 16:20 05/15/16 ([email or web address])    Culture KLEBSIELLA PNEUMONIAE (A)  Final   Report Status 05/17/2016 FINAL  Final   Organism ID, Bacteria KLEBSIELLA PNEUMONIAE  Final      Susceptibility   Klebsiella pneumoniae - MIC*    AMPICILLIN >=32 RESISTANT Resistant     CEFAZOLIN <=4 SENSITIVE Sensitive     CEFEPIME <=1 SENSITIVE Sensitive     CEFTAZIDIME <=1 SENSITIVE Sensitive     CEFTRIAXONE <=1 SENSITIVE Sensitive     CIPROFLOXACIN <=0.25 SENSITIVE Sensitive     GENTAMICIN <=1 SENSITIVE Sensitive     IMIPENEM <=0.25  SENSITIVE Sensitive     TRIMETH/SULFA <=20 SENSITIVE Sensitive     AMPICILLIN/SULBACTAM 8 SENSITIVE Sensitive     PIP/TAZO <=4 SENSITIVE Sensitive     * KLEBSIELLA PNEUMONIAE  Blood Culture ID Panel (Reflexed)     Status: Abnormal   Collection Time: 05/14/16  4:10 PM  Result Value Ref Range Status   Enterococcus species NOT DETECTED NOT DETECTED Final   Vancomycin resistance NOT DETECTED NOT DETECTED Final   Listeria monocytogenes NOT DETECTED NOT DETECTED Final   Staphylococcus species NOT DETECTED NOT DETECTED Final   Staphylococcus aureus NOT DETECTED NOT DETECTED Final   Methicillin resistance NOT DETECTED NOT DETECTED Final   Streptococcus species NOT DETECTED NOT DETECTED Final   Streptococcus agalactiae NOT DETECTED NOT DETECTED Final   Streptococcus pneumoniae NOT DETECTED NOT DETECTED Final   Streptococcus pyogenes NOT DETECTED NOT DETECTED Final   Acinetobacter baumannii NOT DETECTED NOT DETECTED Final   Enterobacteriaceae species DETECTED (A) NOT DETECTED Final    Comment: CRITICAL RESULT CALLED TO, READ BACK BY AND VERIFIED WITH: L. Bajbus Pharm.D. 16:20 05/15/16 ([email or web address])    Enterobacter cloacae complex NOT DETECTED NOT DETECTED Final   Escherichia coli NOT DETECTED NOT DETECTED Final   Klebsiella oxytoca NOT DETECTED NOT DETECTED Final   Klebsiella pneumoniae DETECTED (A) NOT DETECTED Final    Comment: CRITICAL RESULT CALLED TO, READ BACK BY AND VERIFIED WITH: L. Bajbus Pharm.D. 16:20 05/15/16 ([email or web address])    Proteus species NOT DETECTED NOT DETECTED Final   Serratia marcescens NOT DETECTED NOT DETECTED Final   Carbapenem resistance NOT DETECTED NOT DETECTED Final   Haemophilus influenzae NOT DETECTED NOT DETECTED Final   Neisseria meningitidis NOT DETECTED NOT DETECTED Final   Pseudomonas aeruginosa NOT DETECTED NOT DETECTED Final   Candida albicans NOT DETECTED NOT DETECTED Final   Candida glabrata NOT DETECTED NOT DETECTED Final   Candida krusei NOT DETECTED NOT  DETECTED Final   Candida parapsilosis NOT DETECTED NOT DETECTED Final   Candida tropicalis NOT DETECTED NOT DETECTED Final  Blood culture (routine x 2)     Status: None (Preliminary result)   Collection Time: 05/14/16  6:00 PM  Result Value Ref Range Status   Specimen Description BLOOD RIGHT HAND  Final   Special Requests BOTTLES DRAWN AEROBIC AND ANAEROBIC 10CC  Final   Culture NO GROWTH 3 DAYS  Final   Report Status PENDING  Incomplete  Urine culture     Status: Abnormal   Collection Time: 05/15/16  4:39 AM  Result Value Ref Range  Status   Specimen Description URINE, RANDOM  Final   Special Requests NONE  Final   Culture <10,000 COLONIES/mL INSIGNIFICANT GROWTH (A)  Final   Report Status 05/16/2016 FINAL  Final  Aerobic / Anaerobic Culture     Status: None (Preliminary result)   Collection Time: 05/16/16 10:38 AM  Result Value Ref Range Status   Specimen Description ABSCESS  Final   Special Requests L4 TO L5 DISCITIS  Final   Gram Stain   Final    FEW WBC PRESENT, PREDOMINANTLY PMN RARE GRAM POSITIVE COCCI IN PAIRS    Culture CULTURE REINCUBATED FOR BETTER GROWTH  Final   Report Status PENDING  Incomplete  Cath Tip Culture     Status: None (Preliminary result)   Collection Time: 05/16/16  2:51 PM  Result Value Ref Range Status   Specimen Description CATH TIP  Final   Special Requests NONE  Final   Culture NO GROWTH 1 DAY  Final   Report Status PENDING  Incomplete    Studies/Results: Mr Brain Wo Contrast  05/17/2016  CLINICAL DATA:  Sepsis. Discitis and osteomyelitis. Infected dialysis catheter. Acute encephalopathy. New onset dysphagia. EXAM: MRI HEAD WITHOUT CONTRAST TECHNIQUE: Multiplanar, multiecho pulse sequences of the brain and surrounding structures were obtained without intravenous contrast. COMPARISON:  CT head without contrast 05/14/2016. FINDINGS: Moderate generalized atrophy and diffuse white matter disease is present. A remote lacunar infarct is present at the  right thalamus. Remote asymmetric white matter ischemia is present in the anterior left frontal lobe. No acute infarct, hemorrhage, or mass lesion is present. The ventricles are proportionate to the degree of atrophy. No significant extra-axial fluid collection is present. The internal auditory canals are normal bilaterally. Flow is present in the major intracranial arteries. Globes and orbits are intact. The paranasal sinuses are clear. There is some fluid in the posterior mastoid air cells bilaterally. No obstructing nasopharyngeal lesion is present. The skullbase is within normal limits. Intracranial midline sagittal images are unremarkable. Grade 1 anterolisthesis is present at C2-3. A broad-based disc osteophyte complex is present at C3-4. IMPRESSION: 1. Moderate generalized atrophy and diffuse white matter disease without acute intracranial abnormality. 2. Remote lacunar infarct of the right thalamus. 3. Small bilateral posterior mastoid effusions. No obstructing nasopharyngeal lesion is present. 4. Spondylosis of the upper cervical spine as described. Electronically Signed   By: Marin Roberts M.D.   On: 05/17/2016 11:49   Dg Esophagus  05/16/2016  CLINICAL DATA:  Difficulty swallowing. EXAM: ESOPHOGRAM/BARIUM SWALLOW TECHNIQUE: Single contrast examination was performed using  thin barium. FLUOROSCOPY TIME:  Radiation Exposure Index (as provided by the fluoroscopic device): If the device does not provide the exposure index: Fluoroscopy Time:  1 minutes and 36 seconds Number of Acquired Images: COMPARISON:  None. FINDINGS: Patient had difficulty swallowing thin barium. Piecemeal swallowing reveals no gross esophageal stricture, diverticulum, or mass-effect. Patient was given a 13 mm barium tablet, but was unable to swallow without chewing tablet first. IMPRESSION: Very limited exam secondary to piecemeal swallowing and inability to swallow a tablet whole. No gross esophageal abnormality evident.  Consider repeat evaluation after patient recovers from acute clinical picture. Electronically Signed   By: Kennith Center M.D.   On: 05/16/2016 16:38   Ir Fluoro Guide Ndl Plmt / Bx  05/16/2016  INDICATION: 75 year old male with a history of L4-L5 discitis/osteomyelitis. He has been referred for an aspiration for culture. EXAM: IR FLUORO GUIDE NEEDLE PLACEMENT /BIOPSY MEDICATIONS: None. ANESTHESIA/SEDATION: Moderate (conscious) sedation was employed during  this procedure. A total of Versed 1.0 mg and Fentanyl 50 mcg was administered intravenously. Moderate Sedation Time: 20 minutes. The patient's level of consciousness and vital signs were monitored continuously by radiology nursing throughout the procedure under my direct supervision. FLUOROSCOPY TIME:  Fluoroscopy Time: 1 minutes 48 seconds (6 mGy). COMPLICATIONS: None PROCEDURE: Informed written consent was obtained from the patient after a thorough discussion of the procedural risks, benefits and alternatives. All questions were addressed. Maximal Sterile Barrier Technique was utilized including caps, mask, sterile gowns, sterile gloves, sterile drape, hand hygiene and skin antiseptic. A timeout was performed prior to the initiation of the procedure. Patient is position prone position on the fluoroscopy table. The lumbar region was prepped and draped in the usual sterile fashion. Once an oblique angle approach from the left posterior oblique was determined, the skin and subcutaneous tissues were generously infiltrated with 1% lidocaine for local anesthesia. A 20 cm 21 gauge Franceen needle was advanced under fluoroscopy into the disc space at L4-L5. Orthogonal images were acquired. Aspirate was achieved. Patient tolerated the procedure well and remained hemodynamically stable throughout. No complications were encountered and no significant blood loss encountered. IMPRESSION: Status post fluoroscopic guided disc aspirate at the L4-L5 level. Sample sent the lab  for analysis. Signed, Yvone NeuJaime S. Loreta AveWagner, DO [facility] Electronically Signed   By: Gilmer MorJaime  Wagner D.O.   On: 05/16/2016 13:34      Assessment/Plan:  INTERVAL HISTORY:   IR guided aspirate performed   Active Problems:   Anemia of renal disease   Essential hypertension   Pancytopenia (HCC)   Diabetes mellitus with diabetic nephropathy, with long-term current use of insulin (HCC)   Protein-calorie malnutrition, severe   End stage renal disease (HCC)   Bacteremia, coagulase-negative staphylococcal   Sepsis (HCC)   Discitis    Jeffrey Walls is a 75 y.o. male with  MR-Coag negative staph and diskitis on vancomycin admitted with worsening back pain diskitis and now found to have klebsiella in 1/2 blood cultures. HD catheter is out.  #1 Diskitis: aspirate donw with vancomycin no growth. I still am worried that MR-Co ag negative staph is still a likely "player in the back" since he had 2/2 + before for this in April and in particular if nothing grows on culture  --I would be in favor of adding back vancomycin but will hold off for now   #2 Klebsiella PNA in 1/2 cultures: S to ancef which patient is on, HD catheter is out  #3 Atrial mass/; concern for ? Could be a vegetation. Repeat TTE read is pending     LOS: 3 days   Jeffrey Walls 05/17/2016, 1:43 PM

## 2016-05-17 NOTE — Progress Notes (Signed)
  [facility] Progress Note   Subjective: no c/o  Filed Vitals:   05/16/16 1311 05/16/16 2232 05/17/16 0526 05/17/16 0526  BP: 146/79 148/69  153/71  Pulse: 79 75  75  Temp: 98.4 F (36.9 C) 98.3 F (36.8 C) 98.3 F (36.8 C)   TempSrc: Oral Oral Oral   Resp: 15 17  16   Height:      Weight: 54.6 kg (120 lb 5.9 oz)     SpO2: 100% 97%  100%    Inpatient medications: . aspirin EC  81 mg Oral Daily  .  ceFAZolin (ANCEF) IV  1 g Intravenous Q24H  . citalopram  20 mg Oral Daily  . feeding supplement  1 Container Oral TID BM  . fluconazole  100 mg Oral Daily  . folic acid  1 mg Oral Daily  . insulin aspart  0-9 Units Subcutaneous TID WC  . lisinopril  10 mg Oral Daily  . metoprolol succinate  25 mg Oral Daily  . nystatin  5 mL Oral QID  . pantoprazole  20 mg Oral Daily  . sodium chloride flush  3 mL Intravenous Q12H     ALPRAZolam, fentaNYL, lidocaine, midazolam, oxyCODONE  Exam: More alert and cheerful today NO jvd Chest clear bilat RRR soft SEM no RG Abd soft ntnd no ascites No LE edema L chest TDC clean exit site L FA AVF clean wounds, +bruit Neuro nonfocal  Dialysis: MWF [facility] 56kg 2/2.25 bath Hep none LUA AVF (maturing, placed 03/04/16)/ R IJ cath 4 hr      Assessment: 1  Fever/ worsening back pain- sp disc aspiration 2  Klebs bacteremia - poss cath sepsis, TDC removed on 6/2 after HD 3  [facility] HD MWF. Not sure AVF is ready to use yet, placed 3/21.  4  MBD cont meds 5  Anemia cont meds 6  Recent admit April w MRSE cath sepsis, lumbar discitis/osteo and L atrial sidewall mass 7  HTN cont acei/ MTP 8  Vol just under edw  Plan - as above, new cath and HD on Monday   Vinson Moselleob Tanita Palinkas MD [facility] pager [phone]    cell [phone] 05/17/2016, 12:16 PM    Recent Labs Lab 05/14/16 1551 05/15/16 2021 05/16/16 0945  NA 135 133* 136  K 2.9* 2.8* 3.0*  CL 97* 97* 101  CO2 28 27 29   GLUCOSE 86 87 75  BUN 11 21* 14   CREATININE 2.24* 2.89* 2.12*  CALCIUM 7.8* 7.7* 7.4*  PHOS  --  3.7 2.7    Recent Labs Lab 05/15/16 2021 05/16/16 0945  ALBUMIN 2.0* 1.9*    Recent Labs Lab 05/14/16 1551 05/15/16 2020 05/16/16 0945  WBC 8.1 8.1 5.7  NEUTROABS 7.0 6.2  --   HGB 10.1* 8.5* 9.0*  HCT 31.9* 26.8* 28.7*  MCV 88.9 89.3 92.0  PLT 174 146* 141*

## 2016-05-17 NOTE — Progress Notes (Signed)
PROGRESS NOTE                                                                                                                                                                                                             Patient Demographics:    Jeffrey Walls, is a 75 y.o. male, DOB - 1941-03-07, ZOX:[id number]  Admit date - 05/14/2016   Admitting Physician Costin Otelia Sergeant, MD  Outpatient Primary MD for the patient is Theressa Stamps, MD  LOS - 3  Outpatient Specialists: Renal Dr. Algis Liming  Chief Complaint  Patient presents with  . Back Pain       Brief Narrative   75 year old male with history of ESRD on dialysis, combined systolic and diastolic CHF, recent prolonged hospitalization at [facility] for coag-negative staph bacteremia, source felt to be from his dialysis catheter which was replaced and placed on IV vancomycin for 6 weeks course (starting 04/04/2016). 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 Patient presented to the ED with low back pain and acute encephalopathy. Due to his progressive back pain he was unable to complete hemodialysis.. In the ED was septic with fever of 101F and mildly tachycardic. Blood work showed hemoglobin of 10.1, potassium of 2.9 and creatinine of 2.24. Lactic acid was normal. Head CT was unremarkable. CT of the lumbar spine was concerning for discitis and osteomyelitis at the L4-5 level with bony destructive changes and unable to exclude epidural abscess. An MRI of the lumbar spine was done which showed progressive discitis at the L4-5 with destruction of endplates and diffuse marrow edema. Also showed infectious/inflammatory material extending posteriorly into the epidural space from the inferior endplate of L4 within the left lateral recess.  Patient admitted to hospitalist service. Neurosurgery and ID consulted.Blood  cultures on admission growing Klebsiella. Dialysis catheter removed on 6/2.   Subjective:   Patient appears much oriented today, answering questions appropriately. Still complaining of low back pain.   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 Blood culture this admission growing Klebsiella. Started on cefazolin. HD catheter removed after dialysis on 6/2. New catheter will be placed on 6/5. -ID following. Seen by neurosurgeon  Dr. Wynetta Emerycram who recommended to continue with aggressive medical management and given lack of focal neurological deficit does not need surgical intervention at this time. -Lumbar disc aspirate done by IR on 6/2. Follow results. -Sepsis now resolved. On oxycodone for pain.   Active Problems: Acute encephalopathy -Secondary to sepsis. MRI brain done negative for acute findings and shows atrophy and old lacunar infarct.  Acute dysphagia Reports symptoms since 5 days prior to admission, with regurgitation of both solid and liquids. Seen by SLP, esophagogram did not show gross abnormality but limited study. Placed on fluconazole with concern for candidal esophagitis. Will repeat esophagogram tomorrow since his mental status is much improved. Tolerating dysphagia level III diet.  ESRD on M, W, F Getting scheduled dialysis here. Renal following    Anemia of renal disease At baseline.    Essential hypertension Stable on home medications.     Diabetes mellitus with diabetic nephropathy, with long-term current use of insulin (HCC) Stable. Monitor on sliding scale coverage.    Protein-calorie malnutrition, severe Nutrition consult.     Bacteremia, coagulase-negative staphylococcal Getting 6 weeks course of IV vancomycin with dialysis completed on 6/2.Marland Kitchen. TEE showed right atrial mass and was planned for repeat echo after completion of  antibiotics. Repeat echo ordered.  Hypokalemia/hyponatremia correct with HD      Code Status : full code  Family Communication  : [place] with daughter Delice Bisonara on the phone  Disposition Plan  : HD catheter on 6/2. Pending disc aspirate results. PT evaluation. Possibly need skilled nursing facility. Daughter plans to take him to [place] with her once discharged (either from the hospital or from the facility Barriers For Discharge : W/up pending  Consults  :   Renal ID Neurosurgery (Dr Wynetta Emeryram) IR   Procedures  :  CT head  CT lumbar spine  MRI lumbar spine L4-L5 disc aspiration MRI brain  DVT Prophylaxis  :   Sq heparin  Lab Results  Component Value Date   PLT 141* 05/16/2016    Antibiotics  :    Anti-infectives    Start     Dose/Rate Route Frequency Ordered Stop   05/16/16 1815  fluconazole (DIFLUCAN) tablet 100 mg     100 mg Oral Daily 05/16/16 1809 05/23/16 0959   05/16/16 1800  ceFAZolin (ANCEF) IVPB 1 g/50 mL premix     1 g 100 mL/hr over 30 Minutes Intravenous Every 24 hours 05/16/16 1634     05/16/16 1200  vancomycin (VANCOCIN) 500 mg in sodium chloride 0.9 % 100 mL IVPB  Status:  Discontinued     500 mg 100 mL/hr over 60 Minutes Intravenous Every M-W-F (Hemodialysis) 05/14/16 1907 05/15/16 1506   05/14/16 1745  vancomycin (VANCOCIN) IVPB 1000 mg/200 mL premix     1,000 mg 200 mL/hr over 60 Minutes Intravenous  Once 05/14/16 1736 05/14/16 1914        Objective:   Filed Vitals:   05/16/16 1311 05/16/16 2232 05/17/16 0526 05/17/16 0526  BP: 146/79 148/69  153/71  Pulse: 79 75  75  Temp: 98.4 F (36.9 C) 98.3 F (36.8 C) 98.3 F (36.8 C)   TempSrc: Oral Oral Oral   Resp: 15 17  16   Height:      Weight: 54.6 kg (120 lb 5.9 oz)     SpO2: 100% 97%  100%    Wt Readings from Last 3 Encounters:  05/16/16 54.6 kg (120 lb 5.9 oz)  04/11/16 51.8 kg (114 lb 3.2 oz)  03/05/16  57.1 kg (125 lb 14.1 oz)     Intake/Output Summary (Last 24 hours) at 05/17/16  1250 Last data filed at 05/17/16 0916  Gross per 24 hour  Intake      3 ml  Output     -5 ml  Net      8 ml     Physical Exam  Gen: not in distress, Appears more awake and oriented HEENT: moist mucosa, supple neck Chest: clear b/l, no added sounds CVS: N S1&S2, no murmurs, rubs or gallop GI: soft, NT, ND, BS+ Musculoskeletal: warm, no edema,  CNS: AAOX3, nonfocal    Data Review:    CBC  Recent Labs Lab 05/14/16 1551 05/15/16 2020 05/16/16 0945  WBC 8.1 8.1 5.7  HGB 10.1* 8.5* 9.0*  HCT 31.9* 26.8* 28.7*  PLT 174 146* 141*  MCV 88.9 89.3 92.0  MCH 28.1 28.3 28.8  MCHC 31.7 31.7 31.4  RDW 16.6* 16.6* 16.6*  LYMPHSABS 0.6* 1.0  --   MONOABS 0.6 0.9  --   EOSABS 0.0 0.0  --   BASOSABS 0.0 0.0  --     Chemistries   Recent Labs Lab 05/14/16 1551 05/15/16 2021 05/16/16 0945  NA 135 133* 136  K 2.9* 2.8* 3.0*  CL 97* 97* 101  CO2 28 27 29   GLUCOSE 86 87 75  BUN 11 21* 14  CREATININE 2.24* 2.89* 2.12*  CALCIUM 7.8* 7.7* 7.4*   ------------------------------------------------------------------------------------------------------------------ No results for input(s): CHOL, HDL, LDLCALC, TRIG, CHOLHDL, LDLDIRECT in the last 72 hours.  Lab Results  Component Value Date   HGBA1C 5.5 03/31/2016   ------------------------------------------------------------------------------------------------------------------ No results for input(s): TSH, T4TOTAL, T3FREE, THYROIDAB in the last 72 hours.  Invalid input(s): FREET3 ------------------------------------------------------------------------------------------------------------------ No results for input(s): VITAMINB12, FOLATE, FERRITIN, TIBC, IRON, RETICCTPCT in the last 72 hours.  Coagulation profile  Recent Labs Lab 05/16/16 0538  INR 1.37    No results for input(s): DDIMER in the last 72 hours.  Cardiac Enzymes No results for input(s): CKMB, TROPONINI, MYOGLOBIN in the last 168 hours.  Invalid  input(s): CK ------------------------------------------------------------------------------------------------------------------    Component Value Date/Time   BNP >4500.0* 02/28/2016 2200    Inpatient Medications  Scheduled Meds: . aspirin EC  81 mg Oral Daily  .  ceFAZolin (ANCEF) IV  1 g Intravenous Q24H  . citalopram  20 mg Oral Daily  . feeding supplement  1 Container Oral TID BM  . fluconazole  100 mg Oral Daily  . folic acid  1 mg Oral Daily  . insulin aspart  0-9 Units Subcutaneous TID WC  . lisinopril  10 mg Oral Daily  . metoprolol succinate  25 mg Oral Daily  . nystatin  5 mL Oral QID  . pantoprazole  20 mg Oral Daily  . sodium chloride flush  3 mL Intravenous Q12H   Continuous Infusions:  PRN Meds:.ALPRAZolam, fentaNYL, lidocaine, midazolam, oxyCODONE  Micro Results Recent Results (from the past 240 hour(s))  Blood culture (routine x 2)     Status: Abnormal   Collection Time: 05/14/16  4:10 PM  Result Value Ref Range Status   Specimen Description BLOOD LEFT ANTECUBITAL  Final   Special Requests BOTTLES DRAWN AEROBIC AND ANAEROBIC 5CC  Final   Culture  Setup Time   Final    GRAM NEGATIVE RODS AEROBIC BOTTLE ONLY CRITICAL RESULT CALLED TO, READ BACK BY AND VERIFIED WITH: L. Bajbus Pharm.D. 16:20 05/15/16 ([email or web address])    Culture KLEBSIELLA PNEUMONIAE (A)  Final   Report Status  05/17/2016 FINAL  Final   Organism ID, Bacteria KLEBSIELLA PNEUMONIAE  Final      Susceptibility   Klebsiella pneumoniae - MIC*    AMPICILLIN >=32 RESISTANT Resistant     CEFAZOLIN <=4 SENSITIVE Sensitive     CEFEPIME <=1 SENSITIVE Sensitive     CEFTAZIDIME <=1 SENSITIVE Sensitive     CEFTRIAXONE <=1 SENSITIVE Sensitive     CIPROFLOXACIN <=0.25 SENSITIVE Sensitive     GENTAMICIN <=1 SENSITIVE Sensitive     IMIPENEM <=0.25 SENSITIVE Sensitive     TRIMETH/SULFA <=20 SENSITIVE Sensitive     AMPICILLIN/SULBACTAM 8 SENSITIVE Sensitive     PIP/TAZO <=4 SENSITIVE Sensitive     * KLEBSIELLA  PNEUMONIAE  Blood Culture ID Panel (Reflexed)     Status: Abnormal   Collection Time: 05/14/16  4:10 PM  Result Value Ref Range Status   Enterococcus species NOT DETECTED NOT DETECTED Final   Vancomycin resistance NOT DETECTED NOT DETECTED Final   Listeria monocytogenes NOT DETECTED NOT DETECTED Final   Staphylococcus species NOT DETECTED NOT DETECTED Final   Staphylococcus aureus NOT DETECTED NOT DETECTED Final   Methicillin resistance NOT DETECTED NOT DETECTED Final   Streptococcus species NOT DETECTED NOT DETECTED Final   Streptococcus agalactiae NOT DETECTED NOT DETECTED Final   Streptococcus pneumoniae NOT DETECTED NOT DETECTED Final   Streptococcus pyogenes NOT DETECTED NOT DETECTED Final   Acinetobacter baumannii NOT DETECTED NOT DETECTED Final   Enterobacteriaceae species DETECTED (A) NOT DETECTED Final    Comment: CRITICAL RESULT CALLED TO, READ BACK BY AND VERIFIED WITH: L. Bajbus Pharm.D. 16:20 05/15/16 ([email or web address])    Enterobacter cloacae complex NOT DETECTED NOT DETECTED Final   Escherichia coli NOT DETECTED NOT DETECTED Final   Klebsiella oxytoca NOT DETECTED NOT DETECTED Final   Klebsiella pneumoniae DETECTED (A) NOT DETECTED Final    Comment: CRITICAL RESULT CALLED TO, READ BACK BY AND VERIFIED WITH: L. Bajbus Pharm.D. 16:20 05/15/16 ([email or web address])    Proteus species NOT DETECTED NOT DETECTED Final   Serratia marcescens NOT DETECTED NOT DETECTED Final   Carbapenem resistance NOT DETECTED NOT DETECTED Final   Haemophilus influenzae NOT DETECTED NOT DETECTED Final   Neisseria meningitidis NOT DETECTED NOT DETECTED Final   Pseudomonas aeruginosa NOT DETECTED NOT DETECTED Final   Candida albicans NOT DETECTED NOT DETECTED Final   Candida glabrata NOT DETECTED NOT DETECTED Final   Candida krusei NOT DETECTED NOT DETECTED Final   Candida parapsilosis NOT DETECTED NOT DETECTED Final   Candida tropicalis NOT DETECTED NOT DETECTED Final  Blood culture (routine x 2)     Status: None  (Preliminary result)   Collection Time: 05/14/16  6:00 PM  Result Value Ref Range Status   Specimen Description BLOOD RIGHT HAND  Final   Special Requests BOTTLES DRAWN AEROBIC AND ANAEROBIC 10CC  Final   Culture NO GROWTH 3 DAYS  Final   Report Status PENDING  Incomplete  Urine culture     Status: Abnormal   Collection Time: 05/15/16  4:39 AM  Result Value Ref Range Status   Specimen Description URINE, RANDOM  Final   Special Requests NONE  Final   Culture <10,000 COLONIES/mL INSIGNIFICANT GROWTH (A)  Final   Report Status 05/16/2016 FINAL  Final  Aerobic / Anaerobic Culture     Status: None (Preliminary result)   Collection Time: 05/16/16 10:38 AM  Result Value Ref Range Status   Specimen Description ABSCESS  Final   Special Requests L4 TO L5 DISCITIS  Final   Gram  Stain   Final    FEW WBC PRESENT, PREDOMINANTLY PMN RARE GRAM POSITIVE COCCI IN PAIRS    Culture CULTURE REINCUBATED FOR BETTER GROWTH  Final   Report Status PENDING  Incomplete  Cath Tip Culture     Status: None (Preliminary result)   Collection Time: 05/16/16  2:51 PM  Result Value Ref Range Status   Specimen Description CATH TIP  Final   Special Requests NONE  Final   Culture NO GROWTH 1 DAY  Final   Report Status PENDING  Incomplete    Radiology Reports Ct Head Wo Contrast  05/14/2016  CLINICAL DATA:  Fall today. Altered mental status. Agitation/confusion. 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 EXAM: CT HEAD WITHOUT CONTRAST TECHNIQUE: Contiguous axial images were obtained from the base of the skull through the vertex without intravenous contrast. COMPARISON:  05/09/2016 FINDINGS: Small remote lacunar infarcts in the left lentiform nucleus. Otherwise, the brainstem, cerebellum, cerebral peduncles, thalami, basal ganglia, basilar cisterns, and ventricular system appear within normal limits. Periventricular white matter and corona radiata hypodensities favor chronic ischemic microvascular white matter disease. No intracranial  hemorrhage, mass lesion, or acute CVA. IMPRESSION: 1. No acute intracranial findings. 2. Periventricular white matter and corona radiata hypodensities favor chronic ischemic microvascular white matter disease. 3. 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 Electronically Signed   By: Gaylyn Rong M.D.   On: 05/14/2016 16:51   Ct Lumbar Spine Wo Contrast  05/14/2016  CLINICAL DATA:  Fall on Tuesday. Pain in the back. This is worsened. EXAM: CT LUMBAR SPINE WITHOUT CONTRAST TECHNIQUE: Multidetector CT imaging of the lumbar spine was performed without intravenous contrast administration. Multiplanar CT image reconstructions were also generated. COMPARISON:  05/09/2016 FINDINGS: Acute 15% superior endplate compression fracture at T12 with endplate sclerosis and some cortical irregularity. No significant bony retropulsion. This is new compared to the MRI from 04/05/2016. Abnormal erosive findings along the inferior endplate of L4 and superior endplate of L5. These lack the characteristic sclerotic margin for Schmorl's nodes, and moreover there is some sclerosis and interval flattening along the anterior superior endplate of L5 compared to the 04/05/2016 exam. These heighten my concern that the findings at L4-5 are probably due to discitis -osteomyelitis. There is also prominence of the paraspinal tissues at this level deep to the psoas muscles, probably inflammatory. There is also a suggestion of degenerative disc disease at this level potentially causing foraminal impingement. Strictly speaking I cannot exclude epidural abscess given the slight epidural prominence in this vicinity and concern for discitis. Along the inferior endplate of L3 there is a more typical appearing Schmorl' s node, with sclerotic margin. No vertebral subluxation is observed. The is a right twelfth rib fracture posteriorly on image 39/2 L1. Bridging spurring of the left sacroiliac joint. Aortoiliac  atherosclerotic vascular disease. Bilateral pleural effusions. Bilateral perirenal stranding. Diffuse subcutaneous and mesenteric edema. IMPRESSION: 1. 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 2. There is also a new 15% superior endplate compression fracture T12 as well as a nondisplaced fracture of the right T12 rib posteriorly. 3. Bilateral pleural effusions are at least moderate in size but only partially characterized on today' s exam. 4. Perirenal, subcutaneous, and mesenteric edema. Electronically Signed   By: Gaylyn Rong M.D.   On: 05/14/2016 17:10   Mr  Brain Wo Contrast  05/17/2016  CLINICAL DATA:  Sepsis. Discitis and osteomyelitis. Infected dialysis catheter. Acute encephalopathy. New onset dysphagia. EXAM: MRI HEAD WITHOUT CONTRAST TECHNIQUE: Multiplanar, multiecho pulse sequences of the brain and surrounding structures were obtained without intravenous contrast. COMPARISON:  CT head without contrast 05/14/2016. FINDINGS: Moderate generalized atrophy and diffuse white matter disease is present. A remote lacunar infarct is present at the right thalamus. Remote asymmetric white matter ischemia is present in the anterior left frontal lobe. No acute infarct, hemorrhage, or mass lesion is present. The ventricles are proportionate to the degree of atrophy. No significant extra-axial fluid collection is present. The internal auditory canals are normal bilaterally. Flow is present in the major intracranial arteries. Globes and orbits are intact. The paranasal sinuses are clear. There is some fluid in the posterior mastoid air cells bilaterally. No obstructing nasopharyngeal lesion is present. The skullbase is within normal limits. Intracranial midline  sagittal images are unremarkable. Grade 1 anterolisthesis is present at C2-3. A broad-based disc osteophyte complex is present at C3-4. IMPRESSION: 1. Moderate generalized atrophy and diffuse white matter disease without acute intracranial abnormality. 2. Remote lacunar infarct of the right thalamus. 3. Small bilateral posterior mastoid effusions. No obstructing nasopharyngeal lesion is present. 4. Spondylosis of the upper cervical spine as described. Electronically Signed   By: Marin Roberts M.D.   On: 05/17/2016 11:49   Mr Lumbar Spine Wo Contrast  05/14/2016  CLINICAL DATA:  Discitis on treatment for 1 month. Increased confusion and fever. Abnormal CT scan. EXAM: MRI LUMBAR SPINE WITHOUT CONTRAST TECHNIQUE: Multiplanar, multisequence MR imaging of the lumbar spine was performed. No intravenous contrast was administered. COMPARISON:  CT of the lumbar spine 05/14/2016. MRI of the lumbar spine 04/05/2016. FINDINGS: Segmentation: 5 non rib-bearing lumbar type vertebral bodies are present. Alignment: AP alignment is anatomic. Leftward curvature of the lumbar spine is centered at L4-5. Vertebrae: A superior endplate fracture at T12 is again seen, incompletely healed. Diffuse edematous changes are present at L4-5. Conus medullaris: Extends to the T12-L1 level and appears normal. Paraspinal and other soft tissues: Edematous paraspinous changes are present at L4-5 with extension into the psoas musculature. There is no discrete abscess. A 10 mm cyst is present at the lower pole of the left kidney. No other focal solid organ lesions are present. Disc levels: The disc levels at L3-4 above are normal. L3-4: A broad-based disc protrusion is present. Moderate facet hypertrophy is noted. This results in mild central and bilateral foraminal stenosis. L4-5: There is further destruction of endplates. Abnormal signal is present within the disc space and extending into the epidural space on the left. Disc fragment or  epidural abscess extends superiorly along the posterior margin of the left L4 vertebral body 2.5 cm from the inferior endplate of L4. Moderate left subarticular stenosis is present. Moderate left and mild right foraminal narrowing is present. L5-S1: Moderate facet hypertrophy is present bilaterally. There is mild subarticular narrowing on both sides. IMPRESSION: 1. 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 2. Infectious or inflammatory material extends posteriorly from the disc space into the epidural space with superior extension 2.5 cm from the inferior endplate of L4 within the left lateral recess. 3. Contrast could not be given due to renal insufficiency and a GFR of 28. 4. Mild central and bilateral foraminal stenosis at L3-4. 5. Moderate left and mild right foraminal stenosis at L4-5. 6. Mild subarticular narrowing bilaterally at L5-S1 secondary to moderate  facet hypertrophy and spurring. Electronically Signed   By: Marin Roberts M.D.   On: 05/14/2016 21:40   Dg Esophagus  05/16/2016  CLINICAL DATA:  Difficulty swallowing. EXAM: ESOPHOGRAM/BARIUM SWALLOW TECHNIQUE: Single contrast examination was performed using  thin barium. FLUOROSCOPY TIME:  Radiation Exposure Index (as provided by the fluoroscopic device): If the device does not provide the exposure index: Fluoroscopy Time:  1 minutes and 36 seconds Number of Acquired Images: COMPARISON:  None. FINDINGS: Patient had difficulty swallowing thin barium. Piecemeal swallowing reveals no gross esophageal stricture, diverticulum, or mass-effect. Patient was given a 13 mm barium tablet, but was unable to swallow without chewing tablet first. IMPRESSION: Very limited exam secondary to piecemeal swallowing and inability to swallow a tablet whole. No gross esophageal abnormality evident. Consider repeat evaluation after patient recovers from acute clinical picture. Electronically Signed   By: Kennith Center M.D.   On: 05/16/2016 16:38   Ir Fluoro Guide Ndl Plmt / Bx  05/16/2016  INDICATION: 75 year old male with a history of L4-L5 discitis/osteomyelitis. He has been referred for an aspiration for culture. EXAM: IR FLUORO GUIDE NEEDLE PLACEMENT /BIOPSY MEDICATIONS: None. ANESTHESIA/SEDATION: Moderate (conscious) sedation was employed during this procedure. A total of Versed 1.0 mg and Fentanyl 50 mcg was administered intravenously. Moderate Sedation Time: 20 minutes. The patient's level of consciousness and vital signs were monitored continuously by radiology nursing throughout the procedure under my direct supervision. FLUOROSCOPY TIME:  Fluoroscopy Time: 1 minutes 48 seconds (6 mGy). COMPLICATIONS: None PROCEDURE: Informed written consent was obtained from the patient after a thorough discussion of the procedural risks, benefits and alternatives. All questions were addressed. Maximal Sterile Barrier Technique was utilized including caps, mask, sterile gowns, sterile gloves, sterile drape, hand hygiene and skin antiseptic. A timeout was performed prior to the initiation of the procedure. Patient is position prone position on the fluoroscopy table. The lumbar region was prepped and draped in the usual sterile fashion. Once an oblique angle approach from the left posterior oblique was determined, the skin and subcutaneous tissues were generously infiltrated with 1% lidocaine for local anesthesia. A 20 cm 21 gauge Franceen needle was advanced under fluoroscopy into the disc space at L4-L5. Orthogonal images were acquired. Aspirate was achieved. Patient tolerated the procedure well and remained hemodynamically stable throughout. No complications were encountered and no significant blood loss encountered. IMPRESSION: Status post fluoroscopic guided disc aspirate at the L4-L5 level. Sample sent the lab for analysis. Signed, Yvone Neu. Loreta Ave, DO [facility]  Electronically Signed   By: Gilmer Mor D.O.   On: 05/16/2016 13:34    Time Spent in minutes  25   Eddie North M.D on 05/17/2016 at 12:50 PM  Between 7am to 7pm - Pager - [phone]  After 7pm go to [email or web address] - password [facility]  [facility] -  Office  [phone]

## 2016-05-17 NOTE — Progress Notes (Signed)
  Echocardiogram 2D Echocardiogram has been performed.  Leta JunglingCooper, [place] Mullett M 05/17/2016, 3:41 PM

## 2016-05-18 DIAGNOSIS — R222 Localized swelling, mass and lump, trunk: Secondary | ICD-10-CM

## 2016-05-18 DIAGNOSIS — R509 Fever, unspecified: Secondary | ICD-10-CM | POA: Insufficient documentation

## 2016-05-18 LAB — SEDIMENTATION RATE: Sed Rate: 9 mm/hr (ref 0–16)

## 2016-05-18 LAB — GLUCOSE, CAPILLARY
GLUCOSE-CAPILLARY: 167 mg/dL — AB (ref 65–99)
GLUCOSE-CAPILLARY: 148 mg/dL — AB (ref 65–99)
Glucose-Capillary: 91 mg/dL (ref 65–99)
Glucose-Capillary: 93 mg/dL (ref 65–99)

## 2016-05-18 LAB — HIV ANTIBODY (ROUTINE TESTING W REFLEX): HIV SCREEN 4TH GENERATION: NONREACTIVE

## 2016-05-18 LAB — C-REACTIVE PROTEIN: CRP: 9.1 mg/dL — AB (ref <1.0)

## 2016-05-18 MED ORDER — VANCOMYCIN HCL 500 MG IV SOLR
500.0000 mg | INTRAVENOUS | Status: DC
  Administered 2016-05-19 (×2): 500 mg via INTRAVENOUS
  Filled 2016-05-18: qty 500

## 2016-05-18 MED ORDER — METOPROLOL SUCCINATE ER 50 MG PO TB24
50.0000 mg | ORAL_TABLET | Freq: Every day | ORAL | Status: DC
  Administered 2016-05-19 – 2016-05-23 (×5): 50 mg via ORAL
  Filled 2016-05-18 (×5): qty 1

## 2016-05-18 MED ORDER — CEFAZOLIN SODIUM-DEXTROSE 2-4 GM/100ML-% IV SOLN
2.0000 g | INTRAVENOUS | Status: DC
  Administered 2016-05-19: 2 g via INTRAVENOUS
  Filled 2016-05-18: qty 100

## 2016-05-18 MED ORDER — VANCOMYCIN HCL 10 G IV SOLR
1250.0000 mg | INTRAVENOUS | Status: AC
  Administered 2016-05-18: 1250 mg via INTRAVENOUS
  Filled 2016-05-18: qty 1250

## 2016-05-18 NOTE — Progress Notes (Signed)
Subjective:  C/o severe back pain eso with standing from sitting position  Antibiotics:  Anti-infectives    Start     Dose/Rate Route Frequency Ordered Stop   05/19/16 1200  vancomycin (VANCOCIN) 500 mg in sodium chloride 0.9 % 100 mL IVPB     500 mg 100 mL/hr over 60 Minutes Intravenous Every M-W-F (Hemodialysis) 05/18/16 1102     05/19/16 1200  ceFAZolin (ANCEF) IVPB 2g/100 mL premix     2 g 200 mL/hr over 30 Minutes Intravenous Every M-W-F (Hemodialysis) 05/18/16 1105     05/18/16 1130  vancomycin (VANCOCIN) 1,250 mg in sodium chloride 0.9 % 250 mL IVPB     1,250 mg 166.7 mL/hr over 90 Minutes Intravenous NOW 05/18/16 1055 05/18/16 1352   05/16/16 1815  fluconazole (DIFLUCAN) tablet 100 mg     100 mg Oral Daily 05/16/16 1809 05/23/16 0959   05/16/16 1800  ceFAZolin (ANCEF) IVPB 1 g/50 mL premix     1 g 100 mL/hr over 30 Minutes Intravenous Every 24 hours 05/16/16 1634 05/18/16 2359   05/16/16 1200  vancomycin (VANCOCIN) 500 mg in sodium chloride 0.9 % 100 mL IVPB  Status:  Discontinued     500 mg 100 mL/hr over 60 Minutes Intravenous Every M-W-F (Hemodialysis) 05/14/16 1907 05/15/16 1506   05/14/16 1745  vancomycin (VANCOCIN) IVPB 1000 mg/200 mL premix     1,000 mg 200 mL/hr over 60 Minutes Intravenous  Once 05/14/16 1736 05/14/16 1914      Medications: Scheduled Meds: . aspirin EC  81 mg Oral Daily  .  ceFAZolin (ANCEF) IV  1 g Intravenous Q24H  . [START ON 05/19/2016]  ceFAZolin (ANCEF) IV  2 g Intravenous Q M,W,F-HD  . citalopram  20 mg Oral Daily  . feeding supplement  1 Container Oral TID BM  . fluconazole  100 mg Oral Daily  . folic acid  1 mg Oral Daily  . insulin aspart  0-9 Units Subcutaneous TID WC  . lisinopril  10 mg Oral Daily  . [START ON 05/19/2016] metoprolol succinate  50 mg Oral Daily  . nystatin  5 mL Oral QID  . pantoprazole  20 mg Oral Daily  . sodium chloride flush  3 mL Intravenous Q12H  . [START ON 05/19/2016] vancomycin  500 mg  Intravenous Q M,W,F-HD   Continuous Infusions:  PRN Meds:.ALPRAZolam, fentaNYL, lidocaine, midazolam, oxyCODONE    Objective: Weight change:   Intake/Output Summary (Last 24 hours) at 05/18/16 1701 Last data filed at 05/18/16 1328  Gross per 24 hour  Intake    220 ml  Output     85 ml  Net    135 ml   Blood pressure 152/66, pulse 69, temperature 97.8 F (36.6 C), temperature source Oral, resp. rate 18, height  (1.676 m), weight 120 lb 5.9 oz (54.6 kg), SpO2 100 %. Temp:  [97.7 F (36.5 C)-98.1 F (36.7 C)] 97.8 F (36.6 C) (06/04 0659) Pulse Rate:  [68-73] 69 (06/04 0659) Resp:  [16-18] 18 (06/04 0659) BP: (152-176)/(66-77) 152/66 mmHg (06/04 0659) SpO2:  [100 %] 100 % (06/04 0659)  Physical Exam: General: Alert and awake, oriented x3, not in any acute distress., cahectic HEENT: anicteric sclera, pupils reactive to light and accommodation, EOMI CVS regular rate, normal r,  no murmur rubs or gallops Chest: clear to auscultation bilaterally, no wheezing, rales or rhonchi Abdomen: soft nontender, nondistended, normal bowel sounds, Extremities: no  clubbing or edema noted bilaterally  Neuro: nonfocal  CBC: ] CBC Latest Ref Rng 05/16/2016 05/15/2016 05/14/2016  WBC 4.0 - 10.5 K/uL 5.7 8.1 8.1  Hemoglobin 13.0 - 17.0 g/dL 9.0(L) 8.5(L) 10.1(L)  Hematocrit 39.0 - 52.0 % 28.7(L) 26.8(L) 31.9(L)  Platelets 150 - 400 K/uL 141(L) 146(L) 174      BMET  Recent Labs  05/15/16 2021 05/16/16 0945  NA 133* 136  K 2.8* 3.0*  CL 97* 101  CO2 27 29  GLUCOSE 87 75  BUN 21* 14  CREATININE 2.89* 2.12*  CALCIUM 7.7* 7.4*     Liver Panel   Recent Labs  05/15/16 2021 05/16/16 0945  ALBUMIN 2.0* 1.9*       Sedimentation Rate  Recent Labs  05/18/16 0520  ESRSEDRATE 9   C-Reactive Protein  Recent Labs  05/18/16 0520  CRP 9.1*    Micro Results: Recent Results (from the past 720 hour(s))  Blood culture (routine x 2)     Status: Abnormal   Collection  Time: 05/14/16  4:10 PM  Result Value Ref Range Status   Specimen Description BLOOD LEFT ANTECUBITAL  Final   Special Requests BOTTLES DRAWN AEROBIC AND ANAEROBIC 5CC  Final   Culture  Setup Time   Final    GRAM NEGATIVE RODS AEROBIC BOTTLE ONLY CRITICAL RESULT CALLED TO, READ BACK BY AND VERIFIED WITH: L. Bajbus Pharm.D. 16:20 05/15/16 ([email or web address])    Culture KLEBSIELLA PNEUMONIAE (A)  Final   Report Status 05/17/2016 FINAL  Final   Organism ID, Bacteria KLEBSIELLA PNEUMONIAE  Final      Susceptibility   Klebsiella pneumoniae - MIC*    AMPICILLIN >=32 RESISTANT Resistant     CEFAZOLIN <=4 SENSITIVE Sensitive     CEFEPIME <=1 SENSITIVE Sensitive     CEFTAZIDIME <=1 SENSITIVE Sensitive     CEFTRIAXONE <=1 SENSITIVE Sensitive     CIPROFLOXACIN <=0.25 SENSITIVE Sensitive     GENTAMICIN <=1 SENSITIVE Sensitive     IMIPENEM <=0.25 SENSITIVE Sensitive     TRIMETH/SULFA <=20 SENSITIVE Sensitive     AMPICILLIN/SULBACTAM 8 SENSITIVE Sensitive     PIP/TAZO <=4 SENSITIVE Sensitive     * KLEBSIELLA PNEUMONIAE  Blood Culture ID Panel (Reflexed)     Status: Abnormal   Collection Time: 05/14/16  4:10 PM  Result Value Ref Range Status   Enterococcus species NOT DETECTED NOT DETECTED Final   Vancomycin resistance NOT DETECTED NOT DETECTED Final   Listeria monocytogenes NOT DETECTED NOT DETECTED Final   Staphylococcus species NOT DETECTED NOT DETECTED Final   Staphylococcus aureus NOT DETECTED NOT DETECTED Final   Methicillin resistance NOT DETECTED NOT DETECTED Final   Streptococcus species NOT DETECTED NOT DETECTED Final   Streptococcus agalactiae NOT DETECTED NOT DETECTED Final   Streptococcus pneumoniae NOT DETECTED NOT DETECTED Final   Streptococcus pyogenes NOT DETECTED NOT DETECTED Final   Acinetobacter baumannii NOT DETECTED NOT DETECTED Final   Enterobacteriaceae species DETECTED (A) NOT DETECTED Final    Comment: CRITICAL RESULT CALLED TO, READ BACK BY AND VERIFIED WITH: L. Bajbus  Pharm.D. 16:20 05/15/16 ([email or web address])    Enterobacter cloacae complex NOT DETECTED NOT DETECTED Final   Escherichia coli NOT DETECTED NOT DETECTED Final   Klebsiella oxytoca NOT DETECTED NOT DETECTED Final   Klebsiella pneumoniae DETECTED (A) NOT DETECTED Final    Comment: CRITICAL RESULT CALLED TO, READ BACK BY AND VERIFIED WITH: L. Bajbus Pharm.D. 16:20 05/15/16 ([email or web address])    Proteus species NOT DETECTED NOT DETECTED Final   Serratia marcescens NOT DETECTED NOT DETECTED  Final   Carbapenem resistance NOT DETECTED NOT DETECTED Final   Haemophilus influenzae NOT DETECTED NOT DETECTED Final   Neisseria meningitidis NOT DETECTED NOT DETECTED Final   Pseudomonas aeruginosa NOT DETECTED NOT DETECTED Final   Candida albicans NOT DETECTED NOT DETECTED Final   Candida glabrata NOT DETECTED NOT DETECTED Final   Candida krusei NOT DETECTED NOT DETECTED Final   Candida parapsilosis NOT DETECTED NOT DETECTED Final   Candida tropicalis NOT DETECTED NOT DETECTED Final  Blood culture (routine x 2)     Status: None (Preliminary result)   Collection Time: 05/14/16  6:00 PM  Result Value Ref Range Status   Specimen Description BLOOD RIGHT HAND  Final   Special Requests BOTTLES DRAWN AEROBIC AND ANAEROBIC 10CC  Final   Culture NO GROWTH 4 DAYS  Final   Report Status PENDING  Incomplete  Urine culture     Status: Abnormal   Collection Time: 05/15/16  4:39 AM  Result Value Ref Range Status   Specimen Description URINE, RANDOM  Final   Special Requests NONE  Final   Culture <10,000 COLONIES/mL INSIGNIFICANT GROWTH (A)  Final   Report Status 05/16/2016 FINAL  Final  Aerobic / Anaerobic Culture     Status: None (Preliminary result)   Collection Time: 05/16/16 10:38 AM  Result Value Ref Range Status   Specimen Description ABSCESS  Final   Special Requests L4 TO L5 DISCITIS  Final   Gram Stain   Final    FEW WBC PRESENT, PREDOMINANTLY PMN RARE GRAM POSITIVE COCCI IN PAIRS    Culture   Final    FEW  STAPHYLOCOCCUS SPECIES (COAGULASE NEGATIVE) SUSCEPTIBILITIES TO FOLLOW NO ANAEROBES ISOLATED; CULTURE IN PROGRESS FOR 5 DAYS    Report Status PENDING  Incomplete  Cath Tip Culture     Status: None (Preliminary result)   Collection Time: 05/16/16  2:51 PM  Result Value Ref Range Status   Specimen Description CATH TIP  Final   Special Requests NONE  Final   Culture NO GROWTH 2 DAYS  Final   Report Status PENDING  Incomplete    Studies/Results: Mr Brain Wo Contrast  05/17/2016  CLINICAL DATA:  Sepsis. Discitis and osteomyelitis. Infected dialysis catheter. Acute encephalopathy. New onset dysphagia. EXAM: MRI HEAD WITHOUT CONTRAST TECHNIQUE: Multiplanar, multiecho pulse sequences of the brain and surrounding structures were obtained without intravenous contrast. COMPARISON:  CT head without contrast 05/14/2016. FINDINGS: Moderate generalized atrophy and diffuse white matter disease is present. A remote lacunar infarct is present at the right thalamus. Remote asymmetric white matter ischemia is present in the anterior left frontal lobe. No acute infarct, hemorrhage, or mass lesion is present. The ventricles are proportionate to the degree of atrophy. No significant extra-axial fluid collection is present. The internal auditory canals are normal bilaterally. Flow is present in the major intracranial arteries. Globes and orbits are intact. The paranasal sinuses are clear. There is some fluid in the posterior mastoid air cells bilaterally. No obstructing nasopharyngeal lesion is present. The skullbase is within normal limits. Intracranial midline sagittal images are unremarkable. Grade 1 anterolisthesis is present at C2-3. A broad-based disc osteophyte complex is present at C3-4. IMPRESSION: 1. Moderate generalized atrophy and diffuse white matter disease without acute intracranial abnormality. 2. Remote lacunar infarct of the right thalamus. 3. Small bilateral posterior mastoid effusions. No obstructing  nasopharyngeal lesion is present. 4. Spondylosis of the upper cervical spine as described. Electronically Signed   By: Marin Roberts M.D.   On: 05/17/2016  11:49      Assessment/Plan:  INTERVAL HISTORY:   IR guided aspirate performed and cultures with GS showing GPCC--> Coag Negative staph species   Active Problems:   Anemia of renal disease   Essential hypertension   Pancytopenia (HCC)   Diabetes mellitus with diabetic nephropathy, with long-term current use of insulin (HCC)   Protein-calorie malnutrition, severe   End stage renal disease (HCC)   Bacteremia, coagulase-negative staphylococcal   Sepsis (HCC)   Discitis   Discitis of lumbar region   Infection, dialysis vascular access (HCC)    Jeffrey Walls is a 75 y.o. male with  MR-Coag negative staph and diskitis on vancomycin admitted with worsening back pain diskitis and now found to have klebsiella in 1/2 blood cultures. HD catheter is out.Marland Kitchen. He had IR guided aspirate and indeed COAG NEG staph is growing yet again despite his protracted antecedent vancomycin  #1 Diskitis: aspirate growing Co-ag neg staph  --adding back vancomycin --would treat for at least 8 weeks again with IV vancomycin vs Daptomicin   #2 Klebsiella PNA in 1/2 cultures: S to ancef which patient is on, HD catheter is out. I am not sure how significant this isolate is  #3 Atrial mass/; concern for ? Could be a vegetation. I would recommend getting a TEE  Dr. Luciana Axeomer is back tomorrow.    LOS: 4 days   Jeffrey Walls 05/18/2016, 5:01 PM

## 2016-05-18 NOTE — Progress Notes (Addendum)
  [facility] Progress Note   Subjective: no c/o  Filed Vitals:   05/17/16 2131 05/18/16 0013 05/18/16 0427 05/18/16 0659  BP: 176/76 159/70 167/77 152/66  Pulse: 68 73 72 69  Temp: 97.7 F (36.5 C)  97.9 F (36.6 C) 97.8 F (36.6 C)  TempSrc: Oral  Oral   Resp: 18  16 18   Height:      Weight:      SpO2: 100%  100% 100%    Inpatient medications: . aspirin EC  81 mg Oral Daily  .  ceFAZolin (ANCEF) IV  1 g Intravenous Q24H  . [START ON 05/19/2016]  ceFAZolin (ANCEF) IV  2 g Intravenous Q M,W,F-HD  . citalopram  20 mg Oral Daily  . feeding supplement  1 Container Oral TID BM  . fluconazole  100 mg Oral Daily  . folic acid  1 mg Oral Daily  . insulin aspart  0-9 Units Subcutaneous TID WC  . lisinopril  10 mg Oral Daily  . metoprolol succinate  25 mg Oral Daily  . nystatin  5 mL Oral QID  . pantoprazole  20 mg Oral Daily  . sodium chloride flush  3 mL Intravenous Q12H  . vancomycin  1,250 mg Intravenous NOW  . [START ON 05/19/2016] vancomycin  500 mg Intravenous Q M,W,F-HD     ALPRAZolam, fentaNYL, lidocaine, midazolam, oxyCODONE  Exam: Lethargic, arouses and responds approp NO jvd Chest clear bilat RRR soft SEM no RG Abd soft ntnd no ascites No LE edema TDC removed L chest  L FA AVF clean wounds, +bruit Neuro nonfocal  Dialysis: MWF [facility] 56kg 2/2.25 bath Hep none LUA AVF (maturing, placed 03/04/16)/ R IJ cath 4 hr      Assessment: 1  Fever/ worsening back pain- sp disc aspiration is +MRSE, Vanc restarted 2  Klebs bacteremia - cath sepsis, TDC removed on 6/2, IV Ancef 3  [facility] HD MWF. Not sure AVF is ready to use yet, placed 3/21.  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  ^MTP XL 50/d, lower dry wt w HD   Vinson Moselleob Letica Giaimo MD [facility] pager [phone]    cell [phone] 05/18/2016, 1:28 PM    Recent Labs Lab 05/14/16 1551 05/15/16 2021 05/16/16 0945  NA 135 133* 136  K 2.9* 2.8* 3.0*  CL 97* 97* 101  CO2 28 27 29   GLUCOSE 86 87 75  BUN 11 21* 14  CREATININE 2.24* 2.89* 2.12*  CALCIUM 7.8* 7.7* 7.4*  PHOS  --  3.7 2.7    Recent Labs Lab 05/15/16 2021 05/16/16 0945  ALBUMIN 2.0* 1.9*    Recent Labs Lab 05/14/16 1551 05/15/16 2020 05/16/16 0945  WBC 8.1 8.1 5.7  NEUTROABS 7.0 6.2  --   HGB 10.1* 8.5* 9.0*  HCT 31.9* 26.8* 28.7*  MCV 88.9 89.3 92.0  PLT 174 146* 141*

## 2016-05-18 NOTE — Progress Notes (Signed)
PROGRESS NOTE                                                                                                                                                                                                             Patient Demographics:    Jeffrey Walls, is a 75 y.o. male, DOB - Aug 29, 1941, ZOX:[id number]  Admit date - 05/14/2016   Admitting Physician Costin Otelia Sergeant, MD  Outpatient Primary MD for the patient is Theressa Stamps, MD  LOS - 4  Outpatient Specialists: Renal Dr. Algis Liming  Chief Complaint  Patient presents with  . Back Pain       Brief Narrative   75 year old male with history of ESRD on dialysis, combined systolic and diastolic CHF, recent prolonged hospitalization at [facility] for coag-negative staph bacteremia, source felt to be from his dialysis catheter which was replaced and placed on IV vancomycin for 6 weeks course (starting 04/04/2016). 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 Patient presented to the ED with low back pain and acute encephalopathy. Due to his progressive back pain he was unable to complete hemodialysis.. In the ED was septic with fever of 101F and mildly tachycardic. Blood work showed hemoglobin of 10.1, potassium of 2.9 and creatinine of 2.24. Lactic acid was normal. Head CT was unremarkable. CT of the lumbar spine was concerning for discitis and osteomyelitis at the L4-5 level with bony destructive changes and unable to exclude epidural abscess. An MRI of the lumbar spine was done which showed progressive discitis at the L4-5 with destruction of endplates and diffuse marrow edema. Also showed infectious/inflammatory material extending posteriorly into the epidural space from the inferior endplate of L4 within the left lateral recess.  Patient admitted to hospitalist service. Neurosurgery and ID consulted.Blood  cultures on admission growing Klebsiella. Dialysis catheter removed on 6/2.   Subjective:   Patient appears much oriented today, answering questions appropriately. Still complaining of low back pain.   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 Blood culture 1/2 this admission growing Klebsiella. Started on cefazolin. HD catheter removed after dialysis on 6/2. New catheter will be placed on 6/5. -ID following. Seen by  neurosurgeon Dr. Wynetta Emery who recommended to continue with aggressive medical management and given lack of focal neurological deficit does not need surgical intervention at this time. -Lumbar disc aspirate done by IR on 6/2. Culture negative. -Sepsis  resolved. -On oxycodone for pain. -Further antibiotic course and duration per ID.   Active Problems: Acute encephalopathy -Secondary to sepsis. MRI brain done negative for acute findings and shows atrophy and old lacunar infarct.  Acute dysphagia Reports symptoms since 5 days prior to admission, with regurgitation of both solid and liquids. Seen by SLP, esophagogram did not show gross abnormality but limited study. Placed on fluconazole with concern for candidal esophagitis.  repeat esophagogram pending . Tolerating dysphagia level III diet.  ESRD on M, W, F Getting scheduled dialysis here. 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 Will defer to ID on resuming vancomycin.    Anemia of renal disease At baseline.    Essential hypertension Stable on home medications.     Diabetes mellitus with diabetic nephropathy, with long-term current use of insulin (HCC) Stable. Monitor on sliding scale  coverage.    Protein-calorie malnutrition, severe Nutrition consult.    Hypokalemia/hyponatremia correct with HD      Code Status : full code  Family Communication  : [place] with daughter Delice Bison on the phone  Disposition Plan  : HD catheter on 6/2. Pending disc aspirate results. PT evaluation. Possibly need skilled nursing facility. Daughter plans to take him to [place] with her once discharged (either from the hospital or from the facility Barriers For Discharge : W/up pending  Consults  :   Renal ID Neurosurgery (Dr Wynetta Emery) IR   Procedures  :  CT head  CT lumbar spine  MRI lumbar spine L4-L5 disc aspiration MRI brain  DVT Prophylaxis  :   Sq heparin  Lab Results  Component Value Date   PLT 141* 05/16/2016    Antibiotics  :    Anti-infectives    Start     Dose/Rate Route Frequency Ordered Stop   05/19/16 1200  vancomycin (VANCOCIN) 500 mg in sodium chloride 0.9 % 100 mL IVPB     500 mg 100 mL/hr over 60 Minutes Intravenous Every M-W-F (Hemodialysis) 05/18/16 1102     05/19/16 1200  ceFAZolin (ANCEF) IVPB 2g/100 mL premix     2 g 200 mL/hr over 30 Minutes Intravenous Every M-W-F (Hemodialysis) 05/18/16 1105     05/18/16 1130  vancomycin (VANCOCIN) 1,250 mg in sodium chloride 0.9 % 250 mL IVPB     1,250 mg 166.7 mL/hr over 90 Minutes Intravenous NOW 05/18/16 1055 05/19/16 1130   05/16/16 1815  fluconazole (DIFLUCAN) tablet 100 mg     100 mg Oral Daily 05/16/16 1809 05/23/16 0959   05/16/16 1800  ceFAZolin (ANCEF) IVPB 1 g/50 mL premix     1 g 100 mL/hr over 30 Minutes Intravenous Every 24 hours 05/16/16 1634 05/18/16 2359   05/16/16 1200  vancomycin (VANCOCIN) 500 mg in sodium chloride 0.9 % 100 mL IVPB  Status:  Discontinued     500 mg 100 mL/hr over 60 Minutes Intravenous Every M-W-F (Hemodialysis) 05/14/16 1907 05/15/16 1506   05/14/16 1745  vancomycin (VANCOCIN) IVPB 1000 mg/200 mL premix     1,000 mg 200 mL/hr over 60 Minutes Intravenous  Once 05/14/16  1736 05/14/16 1914  Objective:   Filed Vitals:   05/17/16 2131 05/18/16 0013 05/18/16 0427 05/18/16 0659  BP: 176/76 159/70 167/77 152/66  Pulse: 68 73 72 69  Temp: 97.7 F (36.5 C)  97.9 F (36.6 C) 97.8 F (36.6 C)  TempSrc: Oral  Oral   Resp: 18  16 18   Height:      Weight:      SpO2: 100%  100% 100%    Wt Readings from Last 3 Encounters:  05/16/16 54.6 kg (120 lb 5.9 oz)  04/11/16 51.8 kg (114 lb 3.2 oz)  03/05/16 57.1 kg (125 lb 14.1 oz)     Intake/Output Summary (Last 24 hours) at 05/18/16 1307 Last data filed at 05/18/16 0655  Gross per 24 hour  Intake    220 ml  Output     35 ml  Net    185 ml     Physical Exam  Gen: not in distress, HEENT: moist mucosa, supple neck Chest: clear b/l, no added sounds CVS: N S1&S2, no murmurs, rubs or gallop GI: soft, NT, ND, BS+ Musculoskeletal: warm, no edema,  CNS: AAOX3, nonfocal    Data Review:    CBC  Recent Labs Lab 05/14/16 1551 05/15/16 2020 05/16/16 0945  WBC 8.1 8.1 5.7  HGB 10.1* 8.5* 9.0*  HCT 31.9* 26.8* 28.7*  PLT 174 146* 141*  MCV 88.9 89.3 92.0  MCH 28.1 28.3 28.8  MCHC 31.7 31.7 31.4  RDW 16.6* 16.6* 16.6*  LYMPHSABS 0.6* 1.0  --   MONOABS 0.6 0.9  --   EOSABS 0.0 0.0  --   BASOSABS 0.0 0.0  --     Chemistries   Recent Labs Lab 05/14/16 1551 05/15/16 2021 05/16/16 0945  NA 135 133* 136  K 2.9* 2.8* 3.0*  CL 97* 97* 101  CO2 28 27 29   GLUCOSE 86 87 75  BUN 11 21* 14  CREATININE 2.24* 2.89* 2.12*  CALCIUM 7.8* 7.7* 7.4*   ------------------------------------------------------------------------------------------------------------------ No results for input(s): CHOL, HDL, LDLCALC, TRIG, CHOLHDL, LDLDIRECT in the last 72 hours.  Lab Results  Component Value Date   HGBA1C 5.5 03/31/2016   ------------------------------------------------------------------------------------------------------------------ No results for input(s): TSH, T4TOTAL, T3FREE, THYROIDAB in the  last 72 hours.  Invalid input(s): FREET3 ------------------------------------------------------------------------------------------------------------------ No results for input(s): VITAMINB12, FOLATE, FERRITIN, TIBC, IRON, RETICCTPCT in the last 72 hours.  Coagulation profile  Recent Labs Lab 05/16/16 0538  INR 1.37    No results for input(s): DDIMER in the last 72 hours.  Cardiac Enzymes No results for input(s): CKMB, TROPONINI, MYOGLOBIN in the last 168 hours.  Invalid input(s): CK ------------------------------------------------------------------------------------------------------------------    Component Value Date/Time   BNP >4500.0* 02/28/2016 2200    Inpatient Medications  Scheduled Meds: . aspirin EC  81 mg Oral Daily  .  ceFAZolin (ANCEF) IV  1 g Intravenous Q24H  . [START ON 05/19/2016]  ceFAZolin (ANCEF) IV  2 g Intravenous Q M,W,F-HD  . citalopram  20 mg Oral Daily  . feeding supplement  1 Container Oral TID BM  . fluconazole  100 mg Oral Daily  . folic acid  1 mg Oral Daily  . insulin aspart  0-9 Units Subcutaneous TID WC  . lisinopril  10 mg Oral Daily  . metoprolol succinate  25 mg Oral Daily  . nystatin  5 mL Oral QID  . pantoprazole  20 mg Oral Daily  . sodium chloride flush  3 mL Intravenous Q12H  . vancomycin  1,250 mg Intravenous NOW  . Melene Muller  ON 05/19/2016] vancomycin  500 mg Intravenous Q M,W,F-HD   Continuous Infusions:  PRN Meds:.ALPRAZolam, fentaNYL, lidocaine, midazolam, oxyCODONE  Micro Results Recent Results (from the past 240 hour(s))  Blood culture (routine x 2)     Status: Abnormal   Collection Time: 05/14/16  4:10 PM  Result Value Ref Range Status   Specimen Description BLOOD LEFT ANTECUBITAL  Final   Special Requests BOTTLES DRAWN AEROBIC AND ANAEROBIC 5CC  Final   Culture  Setup Time   Final    GRAM NEGATIVE RODS AEROBIC BOTTLE ONLY CRITICAL RESULT CALLED TO, READ BACK BY AND VERIFIED WITH: L. Bajbus Pharm.D. 16:20 05/15/16  ([email or web address])    Culture KLEBSIELLA PNEUMONIAE (A)  Final   Report Status 05/17/2016 FINAL  Final   Organism ID, Bacteria KLEBSIELLA PNEUMONIAE  Final      Susceptibility   Klebsiella pneumoniae - MIC*    AMPICILLIN >=32 RESISTANT Resistant     CEFAZOLIN <=4 SENSITIVE Sensitive     CEFEPIME <=1 SENSITIVE Sensitive     CEFTAZIDIME <=1 SENSITIVE Sensitive     CEFTRIAXONE <=1 SENSITIVE Sensitive     CIPROFLOXACIN <=0.25 SENSITIVE Sensitive     GENTAMICIN <=1 SENSITIVE Sensitive     IMIPENEM <=0.25 SENSITIVE Sensitive     TRIMETH/SULFA <=20 SENSITIVE Sensitive     AMPICILLIN/SULBACTAM 8 SENSITIVE Sensitive     PIP/TAZO <=4 SENSITIVE Sensitive     * KLEBSIELLA PNEUMONIAE  Blood Culture ID Panel (Reflexed)     Status: Abnormal   Collection Time: 05/14/16  4:10 PM  Result Value Ref Range Status   Enterococcus species NOT DETECTED NOT DETECTED Final   Vancomycin resistance NOT DETECTED NOT DETECTED Final   Listeria monocytogenes NOT DETECTED NOT DETECTED Final   Staphylococcus species NOT DETECTED NOT DETECTED Final   Staphylococcus aureus NOT DETECTED NOT DETECTED Final   Methicillin resistance NOT DETECTED NOT DETECTED Final   Streptococcus species NOT DETECTED NOT DETECTED Final   Streptococcus agalactiae NOT DETECTED NOT DETECTED Final   Streptococcus pneumoniae NOT DETECTED NOT DETECTED Final   Streptococcus pyogenes NOT DETECTED NOT DETECTED Final   Acinetobacter baumannii NOT DETECTED NOT DETECTED Final   Enterobacteriaceae species DETECTED (A) NOT DETECTED Final    Comment: CRITICAL RESULT CALLED TO, READ BACK BY AND VERIFIED WITH: L. Bajbus Pharm.D. 16:20 05/15/16 ([email or web address])    Enterobacter cloacae complex NOT DETECTED NOT DETECTED Final   Escherichia coli NOT DETECTED NOT DETECTED Final   Klebsiella oxytoca NOT DETECTED NOT DETECTED Final   Klebsiella pneumoniae DETECTED (A) NOT DETECTED Final    Comment: CRITICAL RESULT CALLED TO, READ BACK BY AND VERIFIED WITH: L. Bajbus  Pharm.D. 16:20 05/15/16 ([email or web address])    Proteus species NOT DETECTED NOT DETECTED Final   Serratia marcescens NOT DETECTED NOT DETECTED Final   Carbapenem resistance NOT DETECTED NOT DETECTED Final   Haemophilus influenzae NOT DETECTED NOT DETECTED Final   Neisseria meningitidis NOT DETECTED NOT DETECTED Final   Pseudomonas aeruginosa NOT DETECTED NOT DETECTED Final   Candida albicans NOT DETECTED NOT DETECTED Final   Candida glabrata NOT DETECTED NOT DETECTED Final   Candida krusei NOT DETECTED NOT DETECTED Final   Candida parapsilosis NOT DETECTED NOT DETECTED Final   Candida tropicalis NOT DETECTED NOT DETECTED Final  Blood culture (routine x 2)     Status: None (Preliminary result)   Collection Time: 05/14/16  6:00 PM  Result Value Ref Range Status   Specimen Description BLOOD RIGHT HAND  Final   Special Requests BOTTLES DRAWN  AEROBIC AND ANAEROBIC 10CC  Final   Culture NO GROWTH 3 DAYS  Final   Report Status PENDING  Incomplete  Urine culture     Status: Abnormal   Collection Time: 05/15/16  4:39 AM  Result Value Ref Range Status   Specimen Description URINE, RANDOM  Final   Special Requests NONE  Final   Culture <10,000 COLONIES/mL INSIGNIFICANT GROWTH (A)  Final   Report Status 05/16/2016 FINAL  Final  Aerobic / Anaerobic Culture     Status: None (Preliminary result)   Collection Time: 05/16/16 10:38 AM  Result Value Ref Range Status   Specimen Description ABSCESS  Final   Special Requests L4 TO L5 DISCITIS  Final   Gram Stain   Final    FEW WBC PRESENT, PREDOMINANTLY PMN RARE GRAM POSITIVE COCCI IN PAIRS    Culture   Final    FEW STAPHYLOCOCCUS SPECIES (COAGULASE NEGATIVE) SUSCEPTIBILITIES TO FOLLOW NO ANAEROBES ISOLATED; CULTURE IN PROGRESS FOR 5 DAYS    Report Status PENDING  Incomplete  Cath Tip Culture     Status: None (Preliminary result)   Collection Time: 05/16/16  2:51 PM  Result Value Ref Range Status   Specimen Description CATH TIP  Final   Special Requests  NONE  Final   Culture NO GROWTH 2 DAYS  Final   Report Status PENDING  Incomplete    Radiology Reports Ct Head Wo Contrast  05/14/2016  CLINICAL DATA:  Fall today. Altered mental status. Agitation/confusion. 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 EXAM: CT HEAD WITHOUT CONTRAST TECHNIQUE: Contiguous axial images were obtained from the base of the skull through the vertex without intravenous contrast. COMPARISON:  05/09/2016 FINDINGS: Small remote lacunar infarcts in the left lentiform nucleus. Otherwise, the brainstem, cerebellum, cerebral peduncles, thalami, basal ganglia, basilar cisterns, and ventricular system appear within normal limits. Periventricular white matter and corona radiata hypodensities favor chronic ischemic microvascular white matter disease. No intracranial hemorrhage, mass lesion, or acute CVA. IMPRESSION: 1. No acute intracranial findings. 2. Periventricular white matter and corona radiata hypodensities favor chronic ischemic microvascular white matter disease. 3. 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 Electronically Signed   By: Gaylyn Rong M.D.   On: 05/14/2016 16:51   Ct Lumbar Spine Wo Contrast  05/14/2016  CLINICAL DATA:  Fall on Tuesday. Pain in the back. This is worsened. EXAM: CT LUMBAR SPINE WITHOUT CONTRAST TECHNIQUE: Multidetector CT imaging of the lumbar spine was performed without intravenous contrast administration. Multiplanar CT image reconstructions were also generated. COMPARISON:  05/09/2016 FINDINGS: Acute 15% superior endplate compression fracture at T12 with endplate sclerosis and some cortical irregularity. No significant bony retropulsion. This is new compared to the MRI from 04/05/2016. Abnormal erosive findings along the inferior endplate of L4 and superior endplate of L5. These lack the characteristic sclerotic margin for Schmorl's nodes, and moreover there is some sclerosis and interval flattening along the anterior superior  endplate of L5 compared to the 04/05/2016 exam. These heighten my concern that the findings at L4-5 are probably due to discitis -osteomyelitis. There is also prominence of the paraspinal tissues at this level deep to the psoas muscles, probably inflammatory. There is also a suggestion of degenerative disc disease at this level potentially causing foraminal impingement. Strictly speaking I cannot exclude epidural abscess given the slight epidural prominence in this vicinity and concern for discitis. Along the inferior endplate of L3 there is a more typical appearing Schmorl' s node, with sclerotic margin. No vertebral subluxation is observed.  The is a right twelfth rib fracture posteriorly on image 39/2 L1. Bridging spurring of the left sacroiliac joint. Aortoiliac atherosclerotic vascular disease. Bilateral pleural effusions. Bilateral perirenal stranding. Diffuse subcutaneous and mesenteric edema. IMPRESSION: 1. 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 2. There is also a new 15% superior endplate compression fracture T12 as well as a nondisplaced fracture of the right T12 rib posteriorly. 3. Bilateral pleural effusions are at least moderate in size but only partially characterized on today' s exam. 4. Perirenal, subcutaneous, and mesenteric edema. Electronically Signed   By: Gaylyn Rong M.D.   On: 05/14/2016 17:10   Mr Brain Wo Contrast  05/17/2016  CLINICAL DATA:  Sepsis. Discitis and osteomyelitis. Infected dialysis catheter. Acute encephalopathy. New onset dysphagia. EXAM: MRI HEAD WITHOUT CONTRAST TECHNIQUE: Multiplanar, multiecho pulse sequences of the brain and surrounding structures were obtained without intravenous contrast. COMPARISON:  CT head  without contrast 05/14/2016. FINDINGS: Moderate generalized atrophy and diffuse white matter disease is present. A remote lacunar infarct is present at the right thalamus. Remote asymmetric white matter ischemia is present in the anterior left frontal lobe. No acute infarct, hemorrhage, or mass lesion is present. The ventricles are proportionate to the degree of atrophy. No significant extra-axial fluid collection is present. The internal auditory canals are normal bilaterally. Flow is present in the major intracranial arteries. Globes and orbits are intact. The paranasal sinuses are clear. There is some fluid in the posterior mastoid air cells bilaterally. No obstructing nasopharyngeal lesion is present. The skullbase is within normal limits. Intracranial midline sagittal images are unremarkable. Grade 1 anterolisthesis is present at C2-3. A broad-based disc osteophyte complex is present at C3-4. IMPRESSION: 1. Moderate generalized atrophy and diffuse white matter disease without acute intracranial abnormality. 2. Remote lacunar infarct of the right thalamus. 3. Small bilateral posterior mastoid effusions. No obstructing nasopharyngeal lesion is present. 4. Spondylosis of the upper cervical spine as described. Electronically Signed   By: Marin Roberts M.D.   On: 05/17/2016 11:49   Mr Lumbar Spine Wo Contrast  05/14/2016  CLINICAL DATA:  Discitis on treatment for 1 month. Increased confusion and fever. Abnormal CT scan. EXAM: MRI LUMBAR SPINE WITHOUT CONTRAST TECHNIQUE: Multiplanar, multisequence MR imaging of the lumbar spine was performed. No intravenous contrast was administered. COMPARISON:  CT of the lumbar spine 05/14/2016. MRI of the lumbar spine 04/05/2016. FINDINGS: Segmentation: 5 non rib-bearing lumbar type vertebral bodies are present. Alignment: AP alignment is anatomic. Leftward curvature of the lumbar spine is centered at L4-5. Vertebrae: A superior endplate fracture at T12 is again seen,  incompletely healed. Diffuse edematous changes are present at L4-5. Conus medullaris: Extends to the T12-L1 level and appears normal. Paraspinal and other soft tissues: Edematous paraspinous changes are present at L4-5 with extension into the psoas musculature. There is no discrete abscess. A 10 mm cyst is present at the lower pole of the left kidney. No other focal solid organ lesions are present. Disc levels: The disc levels at L3-4 above are normal. L3-4: A broad-based disc protrusion is present. Moderate facet hypertrophy is noted. This results in mild central and bilateral foraminal stenosis. L4-5: There is further destruction of endplates. Abnormal signal is present within the disc space and extending into the epidural space on the left. Disc fragment or epidural abscess  extends superiorly along the posterior margin of the left L4 vertebral body 2.5 cm from the inferior endplate of L4. Moderate left subarticular stenosis is present. Moderate left and mild right foraminal narrowing is present. L5-S1: Moderate facet hypertrophy is present bilaterally. There is mild subarticular narrowing on both sides. IMPRESSION: 1. 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 2. Infectious or inflammatory material extends posteriorly from the disc space into the epidural space with superior extension 2.5 cm from the inferior endplate of L4 within the left lateral recess. 3. Contrast could not be given due to renal insufficiency and a GFR of 28. 4. Mild central and bilateral foraminal stenosis at L3-4. 5. Moderate left and mild right foraminal stenosis at L4-5. 6. Mild subarticular narrowing bilaterally at L5-S1 secondary to moderate facet hypertrophy and spurring. Electronically Signed   By: Marin Roberts M.D.   On: 05/14/2016 21:40   Dg Esophagus  05/16/2016  CLINICAL DATA:  Difficulty swallowing. EXAM: ESOPHOGRAM/BARIUM SWALLOW TECHNIQUE: Single contrast  examination was performed using  thin barium. FLUOROSCOPY TIME:  Radiation Exposure Index (as provided by the fluoroscopic device): If the device does not provide the exposure index: Fluoroscopy Time:  1 minutes and 36 seconds Number of Acquired Images: COMPARISON:  None. FINDINGS: Patient had difficulty swallowing thin barium. Piecemeal swallowing reveals no gross esophageal stricture, diverticulum, or mass-effect. Patient was given a 13 mm barium tablet, but was unable to swallow without chewing tablet first. IMPRESSION: Very limited exam secondary to piecemeal swallowing and inability to swallow a tablet whole. No gross esophageal abnormality evident. Consider repeat evaluation after patient recovers from acute clinical picture. Electronically Signed   By: Kennith Center M.D.   On: 05/16/2016 16:38   Ir Fluoro Guide Ndl Plmt / Bx  05/16/2016  INDICATION: 75 year old male with a history of L4-L5 discitis/osteomyelitis. He has been referred for an aspiration for culture. EXAM: IR FLUORO GUIDE NEEDLE PLACEMENT /BIOPSY MEDICATIONS: None. ANESTHESIA/SEDATION: Moderate (conscious) sedation was employed during this procedure. A total of Versed 1.0 mg and Fentanyl 50 mcg was administered intravenously. Moderate Sedation Time: 20 minutes. The patient's level of consciousness and vital signs were monitored continuously by radiology nursing throughout the procedure under my direct supervision. FLUOROSCOPY TIME:  Fluoroscopy Time: 1 minutes 48 seconds (6 mGy). COMPLICATIONS: None PROCEDURE: Informed written consent was obtained from the patient after a thorough discussion of the procedural risks, benefits and alternatives. All questions were addressed. Maximal Sterile Barrier Technique was utilized including caps, mask, sterile gowns, sterile gloves, sterile drape, hand hygiene and skin antiseptic. A timeout was performed prior to the initiation of the procedure. Patient is position prone position on the fluoroscopy table.  The lumbar region was prepped and draped in the usual sterile fashion. Once an oblique angle approach from the left posterior oblique was determined, the skin and subcutaneous tissues were generously infiltrated with 1% lidocaine for local anesthesia. A 20 cm 21 gauge Franceen needle was advanced under fluoroscopy into the disc space at L4-L5. Orthogonal images were acquired. Aspirate was achieved. Patient tolerated the procedure well and remained hemodynamically stable throughout. No complications were encountered and no significant blood loss encountered. IMPRESSION: Status post fluoroscopic guided disc aspirate at the L4-L5 level. Sample sent the lab for analysis. Signed, Yvone Neu. Loreta Ave, DO [facility] Radiology Electronically Signed   By: Gilmer Mor D.O.   On: 05/16/2016 13:34    Time Spent in minutes  25  Eddie NorthHUNGEL, Sashia Campas M.D on 05/18/2016 at 1:07 PM  Between 7am to 7pm - Pager - [phone]) [phone]  After 7pm go to [email or web address] - password [email or web address]  [facility] -  Office  [phone]

## 2016-05-18 NOTE — Progress Notes (Addendum)
Pharmacy Antibiotic Note  Jeffrey Walls is a 75 y.o. male admitted on 05/14/2016 with bacteremia.  Pharmacy currently dosing  Ancef for bacteremia (klebsiella pneumoniae). Today ID has consulted pharmacy to add IV vancomycin for osteomyelitis. The 05/16/16 disc aspiration culture grew staph coag negative. He was previously on IV vancomycin 03/26/16 >>5/31 for CONS bacteremia related to his HD cath (replaced). Last received IV vancomcyin 1gm dose on 05/14/16 @18 :14  ESRD on HD Last HD done on 6/2 x 3h BFR 400 IJ tunneled HD cath removed after HD 6/2.  AVF still maturing. Plan next HD tomorrow 6/5. S/p disc aspiration 6/2  Weight = 54.6 kg   Plan: Vancomcyin 1250mg  IV x1 then 500mg  IV after QHD-qMWF Check pre HD vancomycin random level at steady state.  Continue Ancef 1g IV q24h thru tonight then I have changed to Ancef 2g IV qHD -MWF starting tomorrow 05/19/16.    Height: 5\' 6"  (167.6 cm) Weight: 120 lb 5.9 oz (54.6 kg) IBW/kg (Calculated) : 63.8  Temp (24hrs), Avg:97.9 F (36.6 C), Min:97.7 F (36.5 C), Max:98.2 F (36.8 C)   Recent Labs Lab 05/14/16 1551 05/14/16 1825 05/15/16 2020 05/15/16 2021 05/16/16 0945  WBC 8.1  --  8.1  --  5.7  CREATININE 2.24*  --   --  2.89* 2.12*  LATICACIDVEN  --  0.80  --   --   --     Estimated Creatinine Clearance: 23.6 mL/min (by C-G formula based on Cr of 2.12).    Allergies  Allergen Reactions  . Ampicillin Rash    Ankle swelling  . Benadryl [Diphenhydramine Hcl] Itching    Rash    Antimicrobials this admission: Vanc (PTA since 4/21) >> 5/31:  Restart 6/4> Ancef 6/2>> Diflucan PO 6/2 >(6/8) x 7 days  Dose adjustments this admission: n/a  Microbiology results: 5/31 BCID: klebsiella pneumoniae 5/31 BCx: 1/2 klebsiella pneumoniae 6/1 urine: insignificant growth 6/2 cath tip: sent 6/2 disc aspiration: staph coag neg  Thank you for allowing pharmacy to be a part of this patient's care.  Noah Delaineuth Dessie Tatem, RPh Clinical  Pharmacist Pager: [phone] 05/18/2016 10:47 AM

## 2016-05-19 ENCOUNTER — Inpatient Hospital Stay (HOSPITAL_COMMUNITY): Payer: Medicare HMO | Admitting: Certified Registered"

## 2016-05-19 ENCOUNTER — Inpatient Hospital Stay (HOSPITAL_COMMUNITY): Payer: Medicare HMO

## 2016-05-19 ENCOUNTER — Encounter (HOSPITAL_COMMUNITY)
Admission: EM | Disposition: A | Discharge status: Skilled Nursing Facility | Payer: Self-pay | Source: Home / Self Care | Attending: Internal Medicine

## 2016-05-19 ENCOUNTER — Encounter (HOSPITAL_COMMUNITY): Payer: Self-pay | Admitting: Certified Registered"

## 2016-05-19 DIAGNOSIS — N186 End stage renal disease: Secondary | ICD-10-CM

## 2016-05-19 DIAGNOSIS — T827XXD Infection and inflammatory reaction due to other cardiac and vascular devices, implants and grafts, subsequent encounter: Secondary | ICD-10-CM

## 2016-05-19 HISTORY — PX: INSERTION OF DIALYSIS CATHETER: SHX1324

## 2016-05-19 LAB — BASIC METABOLIC PANEL
Anion gap: 6 (ref 5–15)
BUN: 16 mg/dL (ref 6–20)
CHLORIDE: 102 mmol/L (ref 101–111)
CO2: 29 mmol/L (ref 22–32)
Calcium: 7.6 mg/dL — ABNORMAL LOW (ref 8.9–10.3)
Creatinine, Ser: 2.74 mg/dL — ABNORMAL HIGH (ref 0.61–1.24)
GFR calc non Af Amer: 21 mL/min — ABNORMAL LOW (ref >60)
GFR, EST AFRICAN AMERICAN: 25 mL/min — AB (ref >60)
Glucose, Bld: 192 mg/dL — ABNORMAL HIGH (ref 65–99)
POTASSIUM: 3.1 mmol/L — AB (ref 3.5–5.1)
SODIUM: 137 mmol/L (ref 135–145)

## 2016-05-19 LAB — GLUCOSE, CAPILLARY
GLUCOSE-CAPILLARY: 171 mg/dL — AB (ref 65–99)
Glucose-Capillary: 165 mg/dL — ABNORMAL HIGH (ref 65–99)
Glucose-Capillary: 127 mg/dL — ABNORMAL HIGH (ref 65–99)
Glucose-Capillary: 85 mg/dL (ref 65–99)

## 2016-05-19 LAB — CULTURE, BLOOD (ROUTINE X 2): Culture: NO GROWTH

## 2016-05-19 LAB — CBC
HEMATOCRIT: 31.2 % — AB (ref 39.0–52.0)
HEMOGLOBIN: 9.6 g/dL — AB (ref 13.0–17.0)
MCH: 27.7 pg (ref 26.0–34.0)
MCHC: 30.8 g/dL (ref 30.0–36.0)
MCV: 90.2 fL (ref 78.0–100.0)
Platelets: 162 10*3/uL (ref 150–400)
RBC: 3.46 MIL/uL — AB (ref 4.22–5.81)
RDW: 16.4 % — ABNORMAL HIGH (ref 11.5–15.5)
WBC: 3.8 10*3/uL — ABNORMAL LOW (ref 4.0–10.5)

## 2016-05-19 LAB — SURGICAL PCR SCREEN
MRSA, PCR: NEGATIVE
STAPHYLOCOCCUS AUREUS: NEGATIVE

## 2016-05-19 LAB — CATH TIP CULTURE: CULTURE: NO GROWTH

## 2016-05-19 LAB — HEPATITIS C ANTIBODY (REFLEX): HCV Ab: <0.1 s/co ratio (ref 0.0–0.9)

## 2016-05-19 LAB — HCV COMMENT:

## 2016-05-19 SURGERY — INSERTION OF DIALYSIS CATHETER
Anesthesia: Monitor Anesthesia Care | Site: Neck | Laterality: Right

## 2016-05-19 MED ORDER — CEFAZOLIN SODIUM-DEXTROSE 2-4 GM/100ML-% IV SOLN
2.0000 g | INTRAVENOUS | Status: DC
  Administered 2016-05-21: 2 g via INTRAVENOUS
  Filled 2016-05-19 (×3): qty 100

## 2016-05-19 MED ORDER — PENTAFLUOROPROP-TETRAFLUOROETH EX AERO: 1.0000 "application " | INHALATION_SPRAY | CUTANEOUS | Status: DC | PRN

## 2016-05-19 MED ORDER — HEPARIN SODIUM (PORCINE) 1000 UNIT/ML DIALYSIS: 2000.0000 [IU] | INTRAMUSCULAR | Status: DC | PRN

## 2016-05-19 MED ORDER — SUGAMMADEX SODIUM 200 MG/2ML IV SOLN
INTRAVENOUS | Status: AC
  Filled 2016-05-19: qty 2

## 2016-05-19 MED ORDER — HEPARIN SODIUM (PORCINE) 1000 UNIT/ML IJ SOLN
INTRAMUSCULAR | Status: DC | PRN
  Administered 2016-05-19: 3.4 mL via INTRAVENOUS

## 2016-05-19 MED ORDER — PROPOFOL 10 MG/ML IV BOLUS
INTRAVENOUS | Status: AC
  Filled 2016-05-19: qty 20

## 2016-05-19 MED ORDER — SODIUM CHLORIDE 0.9 % IV SOLN: 100.0000 mL | INTRAVENOUS | Status: DC | PRN

## 2016-05-19 MED ORDER — PROTAMINE SULFATE 10 MG/ML IV SOLN
INTRAVENOUS | Status: AC
  Filled 2016-05-19: qty 5

## 2016-05-19 MED ORDER — ALTEPLASE 2 MG IJ SOLR: 2.0000 mg | Freq: Once | INTRAMUSCULAR | Status: DC | PRN

## 2016-05-19 MED ORDER — HEPARIN SODIUM (PORCINE) 1000 UNIT/ML DIALYSIS: 1000.0000 [IU] | INTRAMUSCULAR | Status: DC | PRN

## 2016-05-19 MED ORDER — SODIUM CHLORIDE 0.9 % IV SOLN
INTRAVENOUS | Status: DC | PRN
  Administered 2016-05-19: 500 mL

## 2016-05-19 MED ORDER — PROPOFOL 500 MG/50ML IV EMUL
INTRAVENOUS | Status: DC | PRN
  Administered 2016-05-19: 50 ug/kg/min via INTRAVENOUS

## 2016-05-19 MED ORDER — 0.9 % SODIUM CHLORIDE (POUR BTL) OPTIME
TOPICAL | Status: DC | PRN
  Administered 2016-05-19: 1000 mL

## 2016-05-19 MED ORDER — FENTANYL CITRATE (PF) 250 MCG/5ML IJ SOLN
INTRAMUSCULAR | Status: AC
  Filled 2016-05-19: qty 5

## 2016-05-19 MED ORDER — LIDOCAINE-EPINEPHRINE 0.5 %-1:200000 IJ SOLN
INTRAMUSCULAR | Status: DC | PRN
  Administered 2016-05-19: 7 mL

## 2016-05-19 MED ORDER — LIDOCAINE HCL (PF) 1 % IJ SOLN: 5.0000 mL | INTRAMUSCULAR | Status: DC | PRN

## 2016-05-19 MED ORDER — OXYCODONE HCL 5 MG PO TABS
ORAL_TABLET | ORAL | Status: AC
  Administered 2016-05-19: 5 mg via ORAL
  Filled 2016-05-19: qty 1

## 2016-05-19 MED ORDER — SODIUM CHLORIDE 0.9 % IV SOLN
INTRAVENOUS | Status: DC | PRN
  Administered 2016-05-19: 12:00:00 via INTRAVENOUS

## 2016-05-19 MED ORDER — ONDANSETRON HCL 4 MG/2ML IJ SOLN
INTRAMUSCULAR | Status: AC
  Filled 2016-05-19: qty 2

## 2016-05-19 MED ORDER — LIDOCAINE-EPINEPHRINE 0.5 %-1:200000 IJ SOLN
INTRAMUSCULAR | Status: AC
  Filled 2016-05-19: qty 1

## 2016-05-19 MED ORDER — VANCOMYCIN HCL IN DEXTROSE 500-5 MG/100ML-% IV SOLN
INTRAVENOUS | Status: AC
  Administered 2016-05-19: 500 mg
  Filled 2016-05-19: qty 100

## 2016-05-19 MED ORDER — LIDOCAINE-PRILOCAINE 2.5-2.5 % EX CREA: 1.0000 "application " | TOPICAL_CREAM | CUTANEOUS | Status: DC | PRN

## 2016-05-19 MED ORDER — HEPARIN SODIUM (PORCINE) 1000 UNIT/ML IJ SOLN
INTRAMUSCULAR | Status: AC
  Filled 2016-05-19: qty 1

## 2016-05-19 MED ORDER — FENTANYL CITRATE (PF) 250 MCG/5ML IJ SOLN
INTRAMUSCULAR | Status: DC | PRN
  Administered 2016-05-19: 100 ug via INTRAVENOUS

## 2016-05-19 MED ORDER — VANCOMYCIN HCL 500 MG IV SOLR
500.0000 mg | INTRAVENOUS | Status: DC
  Administered 2016-05-21: 500 mg via INTRAVENOUS
  Filled 2016-05-19 (×3): qty 500

## 2016-05-19 SURGICAL SUPPLY — 47 items
BAG BANDED W/RUBBER/TAPE 36X54 (MISCELLANEOUS) ×3 IMPLANT
BAG DECANTER FOR FLEXI CONT (MISCELLANEOUS) ×3 IMPLANT
BENZOIN TINCTURE PRP APPL 2/3 (GAUZE/BANDAGES/DRESSINGS) IMPLANT
BIOPATCH RED 1 DISK 7.0 (GAUZE/BANDAGES/DRESSINGS) ×2 IMPLANT
BIOPATCH RED 1IN DISK 7.0MM (GAUZE/BANDAGES/DRESSINGS) ×1
CATH PALINDROME RT-P 15FX19CM (CATHETERS) IMPLANT
CATH PALINDROME RT-P 15FX23CM (CATHETERS) ×3 IMPLANT
CATH PALINDROME RT-P 15FX28CM (CATHETERS) IMPLANT
CATH PALINDROME RT-P 15FX55CM (CATHETERS) IMPLANT
CATH STRAIGHT 5FR 65CM (CATHETERS) ×3 IMPLANT
CLOSURE STERI-STRIP 1/2X4 (GAUZE/BANDAGES/DRESSINGS)
CLOSURE WOUND 1/2 X4 (GAUZE/BANDAGES/DRESSINGS)
CLSR STERI-STRIP ANTIMIC 1/2X4 (GAUZE/BANDAGES/DRESSINGS) IMPLANT
COVER DOME SNAP 22 D (MISCELLANEOUS) ×3 IMPLANT
COVER PROBE W GEL 5X96 (DRAPES) IMPLANT
DECANTER SPIKE VIAL GLASS SM (MISCELLANEOUS) ×3 IMPLANT
DRAPE C-ARM 42X72 X-RAY (DRAPES) IMPLANT
DRAPE CHEST BREAST 15X10 FENES (DRAPES) ×3 IMPLANT
GAUZE SPONGE 2X2 8PLY STRL LF (GAUZE/BANDAGES/DRESSINGS) IMPLANT
GAUZE SPONGE 4X4 16PLY XRAY LF (GAUZE/BANDAGES/DRESSINGS) ×3 IMPLANT
GLOVE BIOGEL PI IND STRL 6.5 (GLOVE) ×2 IMPLANT
GLOVE BIOGEL PI IND STRL 7.0 (GLOVE) ×2 IMPLANT
GLOVE BIOGEL PI INDICATOR 6.5 (GLOVE) ×4
GLOVE BIOGEL PI INDICATOR 7.0 (GLOVE) ×4
GLOVE SS BIOGEL STRL SZ 7.5 (GLOVE) ×1 IMPLANT
GLOVE SUPERSENSE BIOGEL SZ 7.5 (GLOVE) ×2
GLOVE SURG SS PI 6.5 STRL IVOR (GLOVE) ×3 IMPLANT
GOWN STRL REUS W/ TWL LRG LVL3 (GOWN DISPOSABLE) ×2 IMPLANT
GOWN STRL REUS W/TWL LRG LVL3 (GOWN DISPOSABLE) ×4
KIT BASIN OR (CUSTOM PROCEDURE TRAY) ×3 IMPLANT
KIT ROOM TURNOVER OR (KITS) ×3 IMPLANT
NEEDLE 18GX1X1/2 (RX/OR ONLY) (NEEDLE) ×3 IMPLANT
NEEDLE 22X1 1/2 (OR ONLY) (NEEDLE) ×3 IMPLANT
NEEDLE HYPO 25GX1X1/2 BEV (NEEDLE) ×3 IMPLANT
NS IRRIG 1000ML POUR BTL (IV SOLUTION) ×3 IMPLANT
PACK SURGICAL SETUP 50X90 (CUSTOM PROCEDURE TRAY) ×3 IMPLANT
PAD ARMBOARD 7.5X6 YLW CONV (MISCELLANEOUS) ×6 IMPLANT
SOAP 2 % CHG 4 OZ (WOUND CARE) ×3 IMPLANT
SPONGE GAUZE 2X2 STER 10/PKG (GAUZE/BANDAGES/DRESSINGS)
STRIP CLOSURE SKIN 1/2X4 (GAUZE/BANDAGES/DRESSINGS) IMPLANT
SUT ETHILON 3 0 PS 1 (SUTURE) ×3 IMPLANT
SUT VICRYL 4-0 PS2 18IN ABS (SUTURE) ×3 IMPLANT
SYR 20CC LL (SYRINGE) ×3 IMPLANT
SYR 5ML LL (SYRINGE) ×6 IMPLANT
SYR CONTROL 10ML LL (SYRINGE) ×3 IMPLANT
SYRINGE 10CC LL (SYRINGE) ×3 IMPLANT
WATER STERILE IRR 1000ML POUR (IV SOLUTION) IMPLANT

## 2016-05-19 NOTE — Progress Notes (Signed)
SLP Cancellation Note  Patient Details Name: [name] MRN: [id number] DOB: 1941/08/09   Cancelled treatment:       Reason Eval/Treat Not Completed: Patient at procedure or test/unavailable. NPO for procedure today. Looks like repeat esophagram. Will f/u tomorrow.    Berlynn Warsame, Riley NearingBonnie Caroline 05/19/2016, 8:01 AM

## 2016-05-19 NOTE — Op Note (Signed)
    OPERATIVE REPORT  DATE OF SURGERY: 05/19/2016  PATIENT: Jeffrey Walls, 75 y.o. male MRN: [id number]  DOB: March 01, 1941  PRE-OPERATIVE DIAGNOSIS: End-stage renal disease  POST-OPERATIVE DIAGNOSIS:  Same  PROCEDURE: Right IJ hemodialysis catheter  SURGEON:  Gretta Beganodd Iktan Aikman, M.D.  PHYSICIAN ASSISTANT: Nurse  ANESTHESIA:  Local with sedation  EBL: Minimal ml  Total I/O In: 150 [I.V.:150] Out: 20 [Blood:20]  BLOOD ADMINISTERED: None  DRAINS: None  SPECIMEN: None  COUNTS CORRECT:  YES  PLAN OF CARE: PACU with chest x-ray pending   PATIENT DISPOSITION:  PACU - hemodynamically stable  PROCEDURE DETAILS: Patient was taken to the operating placed supine position where the area of the right  Draped use sterile fashion. The patient was placed in Trendelenburg position and using SonoSite visualization and local anesthesia the right internal jugular vein was accessed. A guidewire was placed on the level the right atrium and this was confirmed with fluoroscopy. A dilator and peel-away sheath was passed over the guidewire and the dilator and guidewire were removed. A 23 cm catheter was positioned with the tips of the level of the distal right atrium. The peel-away sheath was removed. The catheter was brought through a subcutaneous tunnel through a separate stab incision and the 2 lm ports were attached. Both lumens flushed and aspirated easily and were locked with 1000/cc heparin. The catheter was viewed in its entirety and there was no kinking in good positioning. The catheter was secured to the skin with 3-0 nylon stitch and the entry site was closed with a 4-0 subcuticular Vicryl stitch. Sterile dressing was applied the patient was transferred to the recovery room where chest x-ray is pending   Gretta Beganodd Labradford Schnitker, M.D. 05/19/2016 1:58 PM

## 2016-05-19 NOTE — Transfer of Care (Signed)
Immediate Anesthesia Transfer of Care Note  Patient: Jeffrey Walls  Procedure(s) Performed: Procedure(s): INSERTION OF RIGHT INTERNAL JUGULAR TUNNEL DIALYSIS CATHETER (Right)  Patient Location: PACU  Anesthesia Type:MAC  Level of Consciousness: awake, alert , oriented and patient cooperative  Airway & Oxygen Therapy: Patient Spontanous Breathing and Patient connected to nasal cannula oxygen  Post-op Assessment: Report given to RN, Post -op Vital signs reviewed and stable and Patient moving all extremities  Post vital signs: Reviewed and stable  Last Vitals:  Filed Vitals:   05/19/16 0534 05/19/16 0937  BP: 139/86 144/78  Pulse: 70 77  Temp: 36.8 C   Resp: 18 16    Last Pain:  Filed Vitals:   05/19/16 1331  PainSc: 9       Patients Stated Pain Goal: 2 (05/18/16 2323)  Complications: No apparent anesthesia complications

## 2016-05-19 NOTE — Progress Notes (Signed)
  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 No c/o this AM  Filed Vitals:   05/18/16 1744 05/18/16 2232 05/19/16 0534 05/19/16 0937  BP: 139/83 138/69 139/86 144/78  Pulse: 68 71 70 77  Temp: 97.7 F (36.5 C) 98 F (36.7 C) 98.2 F (36.8 C)   TempSrc:      Resp: 17 18 18 16   Height:      Weight:      SpO2: 92% 100% 100% 96%    Inpatient medications: . aspirin EC  81 mg Oral Daily  .  ceFAZolin (ANCEF) IV  2 g Intravenous Q M,W,F-HD  . citalopram  20 mg Oral Daily  . feeding supplement  1 Container Oral TID BM  . fluconazole  100 mg Oral Daily  . folic acid  1 mg Oral Daily  . insulin aspart  0-9 Units Subcutaneous TID WC  . lisinopril  10 mg Oral Daily  . metoprolol succinate  50 mg Oral Daily  . nystatin  5 mL Oral QID  . pantoprazole  20 mg Oral Daily  . sodium chloride flush  3 mL Intravenous Q12H  . vancomycin  500 mg Intravenous Q M,W,F-HD     ALPRAZolam, fentaNYL, lidocaine, midazolam, oxyCODONE  Exam: Lethargic, arouses and responds approp NO jvd Chest clear bilat RRR soft SEM no RG Abd soft ntnd no ascites No LE edema TDC removed L chest  L FA AVF clean wounds, +bruit Neuro nonfocal  Dialysis: MWF [facility] 56kg 2/2.25 bath Hep none LUA AVF (maturing, placed 03/04/16)/ R IJ cath 4 hr      Assessment: 1  Persistent CNS Lumbar Diskitis: on IV Vanc, RCID following 2  Klebs bacteremia - cath sepsis, TDC removed on 6/2, IV cefazolin 3  ESRD HD MWF. Not sure AVF is ready to use yet, placed 3/21.  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  Probe for lower EDW  Jeffrey Heckyan Jowan Skillin MD [facility] 05/19/2016, 10:21 AM    Recent  Labs Lab 05/15/16 2021 05/16/16 0945 05/19/16 0625  NA 133* 136 137  K 2.8* 3.0* 3.1*  CL 97* 101 102  CO2 27 29 29   GLUCOSE 87 75 192*  BUN 21* 14 16  CREATININE 2.89* 2.12* 2.74*  CALCIUM 7.7* 7.4* 7.6*  PHOS 3.7 2.7  --     Recent Labs Lab 05/15/16 2021 05/16/16 0945  ALBUMIN 2.0* 1.9*    Recent Labs Lab 05/14/16 1551 05/15/16 2020 05/16/16 0945 05/19/16 0625  WBC 8.1 8.1 5.7 3.8*  NEUTROABS 7.0 6.2  --   --   HGB 10.1* 8.5* 9.0* 9.6*  HCT 31.9* 26.8* 28.7* 31.2*  MCV 88.9 89.3 92.0 90.2  PLT 174 146* 141* 162

## 2016-05-19 NOTE — Care Management Important Message (Signed)
Important Message  Patient Details  Name: Christianne Dolindgar Chaisson MRN: [id number] Date of Birth: 04-24-1941   Medicare Important Message Given:  Yes    Kyla BalzarineShealy, Jayma Volpi Abena 05/19/2016, 12:24 PM

## 2016-05-19 NOTE — NC FL2 (Signed)
[place] MEDICAID FL2 LEVEL OF CARE SCREENING TOOL     IDENTIFICATION  Patient Name: Jeffrey Walls Birthdate: [birth year] Sex: male Admission Date (Current Location): 05/14/2016  [facility] [place]  [employer]   Facility and Address:  [facility], [address], [place], [place] [address]      Provider Number: [id number]  Attending Physician Name and Address:  Eddie NorthNishant Dhungel, MD  Relative Name and Phone Number:  Kathi Simpersara Lowery -daughter. Phone number [phone]    Current Level of Care: Hospital Recommended Level of Care: Skilled Nursing Facility Prior Approval Number:    Date Approved/Denied:   PASRR Number: [id number]  Discharge Plan: SNF    Current Diagnoses: Patient Active Problem List   Diagnosis Date Noted  . Other specified fever   . Discitis of lumbar region   . Infection, dialysis vascular access (HCC)   . Discitis   . Sepsis (HCC) 05/14/2016  . ESRD (end stage renal disease) (HCC)   . Bacteremia, coagulase-negative staphylococcal 04/04/2016  . Acute combined systolic and diastolic heart failure (HCC) 04/02/2016  . Syncope 03/30/2016  . Elevated troponin 03/30/2016  . End stage renal disease (HCC)   . Protein-calorie malnutrition, severe 03/05/2016  . Essential hypertension 03/04/2016  . Pancytopenia (HCC) 03/04/2016  . Diabetes mellitus with diabetic nephropathy, with long-term current use of insulin (HCC) 03/04/2016  . Frequent falls 02/29/2016  . Volume overload 02/28/2016  . ESRD on hemodialysis (HCC) 12/25/2015  . Anemia of renal disease 12/25/2015    Orientation RESPIRATION BLADDER Height & Weight     Self, Place  Normal Continent Weight: 120 lb 5.9 oz (54.6 kg) Height:  5\' 6"  (167.6 cm)  BEHAVIORAL SYMPTOMS/MOOD NEUROLOGICAL BOWEL NUTRITION STATUS      Continent Diet (dyphasia 3)  AMBULATORY STATUS COMMUNICATION OF NEEDS Skin   Limited Assist Verbally Normal                       Personal Care  Assistance Level of Assistance  Bathing, Dressing Bathing Assistance: Limited assistance   Dressing Assistance: Limited assistance     Functional Limitations Info             SPECIAL CARE FACTORS FREQUENCY  PT (By licensed PT), OT (By licensed OT)     PT Frequency: 5/wk OT Frequency: 5/wk            Contractures      Additional Factors Info  Code Status, Allergies, Insulin Sliding Scale Code Status Info: FULL Allergies Info: Ampicillin, Benadryl   Insulin Sliding Scale Info: 3/day       Current Medications (05/19/2016):  This is the current hospital active medication list Current Facility-Administered Medications  Medication Dose Route Frequency Provider Last Rate Last Dose  . ALPRAZolam (XANAX) tablet 0.25 mg  0.25 mg Oral BID PRN Nishant Dhungel, MD   0.25 mg at 05/18/16 2324  . aspirin EC tablet 81 mg  81 mg Oral Daily Leatha Gildingostin M Gherghe, MD   81 mg at 05/19/16 0940  . ceFAZolin (ANCEF) IVPB 2g/100 mL premix  2 g Intravenous Q M,W,F-HD Tamera Reasonuth P Clark, RPH      . citalopram (CELEXA) tablet 20 mg  20 mg Oral Daily Leatha Gildingostin M Gherghe, MD   20 mg at 05/19/16 0939  . feeding supplement (BOOST / RESOURCE BREEZE) liquid 1 Container  1 Container Oral TID BM Nishant Dhungel, MD   1 Container at 05/18/16 2000  . fentaNYL (SUBLIMAZE) injection  Intravenous PRN Gilmer Mor, DO   25 mcg at 05/16/16 0847  . fluconazole (DIFLUCAN) tablet 100 mg  100 mg Oral Daily Meryl Dare, MD   100 mg at 05/19/16 0940  . folic acid (FOLVITE) tablet 1 mg  1 mg Oral Daily Costin Otelia Sergeant, MD   1 mg at 05/19/16 0940  . insulin aspart (novoLOG) injection 0-9 Units  0-9 Units Subcutaneous TID WC Costin Otelia Sergeant, MD   2 Units at 05/19/16 0940  . lidocaine (XYLOCAINE) 2 % injection 0-20 mL  0-20 mL Intradermal Once PRN Samantha J Rhyne, PA-C      . lisinopril (PRINIVIL,ZESTRIL) tablet 10 mg  10 mg Oral Daily Nishant Dhungel, MD   10 mg at 05/19/16 0939  . metoprolol succinate (TOPROL-XL) 24 hr tablet  50 mg  50 mg Oral Daily Delano Metz, MD   50 mg at 05/19/16 0940  . midazolam (VERSED) injection   Intravenous PRN Gilmer Mor, DO   1 mg at 05/16/16 0847  . nystatin (MYCOSTATIN) 100000 UNIT/ML suspension 500,000 Units  5 mL Oral QID Meryl Dare, MD   500,000 Units at 05/19/16 0940  . oxyCODONE (Oxy IR/ROXICODONE) immediate release tablet 5 mg  5 mg Oral Q4H PRN Leatha Gilding, MD   5 mg at 05/18/16 2324  . pantoprazole (PROTONIX) EC tablet 20 mg  20 mg Oral Daily Costin Otelia Sergeant, MD   20 mg at 05/19/16 0940  . sodium chloride flush (NS) 0.9 % injection 3 mL  3 mL Intravenous Q12H Costin Otelia Sergeant, MD   3 mL at 05/19/16 0941  . vancomycin (VANCOCIN) 500 mg in sodium chloride 0.9 % 100 mL IVPB  500 mg Intravenous Q M,W,F-HD Nishant Dhungel, MD         Discharge Medications: Please see discharge summary for a list of discharge medications.  Relevant Imaging Results:  Relevant Lab Results:   Additional Information ss# [id number] . Dialysis MWF - [facility].  [place], [place], [place]

## 2016-05-19 NOTE — Progress Notes (Signed)
PROGRESS NOTE                                                                                                                                                                                                             Patient Demographics:    Jeffrey Walls, is a 75 y.o. male, DOB - [birth year], ZOX:[id number]  Admit date - 05/14/2016   Admitting Physician Costin Otelia SergeantM Gherghe, MD  Outpatient Primary MD for the patient is Theressa StampsHADLEY,ALEXANDER, MD  LOS - 5  Outpatient Specialists: Renal Dr. Algis LimingVandam  Chief Complaint  Patient presents with  . Back Pain       Brief Narrative   75 year old male with history of ESRD on dialysis, combined systolic and diastolic CHF, recent prolonged [facility] for coag-negative staph bacteremia, source felt to be from his dialysis catheter which was replaced and placed on IV vancomycin for 6 weeks course (starting 04/04/2016). 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 Patient presented to the ED with low back pain and acute encephalopathy. Due to his progressive back pain he was unable to complete hemodialysis.. In the ED was septic with fever of 101F and mildly tachycardic. Blood work showed hemoglobin of 10.1, potassium of 2.9 and creatinine of 2.24. Lactic acid was normal. Head CT was unremarkable. CT of the lumbar spine was concerning for discitis and osteomyelitis at the L4-5 level with bony destructive changes and unable to exclude epidural abscess. An MRI of the lumbar spine was done which showed progressive discitis at the L4-5 with destruction of endplates and diffuse marrow edema. Also showed infectious/inflammatory material extending posteriorly into the epidural space from the inferior endplate of L4 within the left lateral recess.  Patient admitted to hospitalist service. Neurosurgery and ID consulted.Blood  cultures on admission growing Klebsiella. Dialysis catheter removed on 6/2 and placed a new one on 6/5. Disc aspirate growing coag negative staph aureus.   Subjective:   Back pain better. 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 Disc aspirate growing coag negative staph aureus. Blood culture 1/2 this admission growing Klebsiella. Started on cefazolin. HD catheter removed after dialysis  on 6/2. New catheter  placed on 6/5. -ID following. Seen by neurosurgeon Dr. Wynetta Emery who recommended to continue with aggressive medical management and given lack of focal neurological deficit does not need surgical intervention at this time. -Sepsis  resolved. -On oxycodone when necessary for pain.  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 Discussed the findings with Dr. Ronelle Nigh where performed TEE earlier. It appeared that this could be vegetation caused by impingement of the prior infected HD catheter on the right atrial free wall. Unfortunately the new HD catheter that was placed in is again in the right atrium so a repeat TEE may not give [place] an exact diagnosis (vegetation versus myxoma). Another option is cardiac MRI with contrast. Discussed with nephrology who recommended against imaging with contrast given high risk of fibrosis. Noncontrast MRI would not have a better yield compared to TEE.  Discussed in detail with Dr. Jearld Pies, nephrology (Dr. Marisue Humble) and ID Dr. Luciana Axe. Plan on treating him with 6 more weeks of IV vancomycin. Hopefully by then his fistula would have matured and we will be able to remove the dialysis catheter and plan on repeat TEE. Stop date for IV vancomycin would be 06/29/2016.     Active Problems: Klebsiella bacteremia ? Due to infected HD catheter. ID recommends Ancef for Klebsiella  bacteremia for one week duration. Check repeat blood culture for clearing.  Acute encephalopathy -Secondary to sepsis. MRI brain done negative for acute findings and shows atrophy and old lacunar infarct. Mentation back to normal.  Acute dysphagia Possibly due to oral/esophageal candidiasis. Improved with fluconazole.  ESRD on M, W, F Getting scheduled dialysis here. Renal following . HD catheter replaced today.   coagulase-negative staph bacteremia in April 2017 As outlined above. Completed 6 weeks course of IV vancomycin on 05/16/2016. Resumed on 6/4 for 6 weeks course further until 06/29/2616    Anemia of renal disease At baseline.    Essential hypertension Stable on home medications.     Diabetes mellitus with diabetic nephropathy, with long-term current use of insulin (HCC) Stable. Monitor on sliding scale coverage.    Protein-calorie malnutrition, severe Nutrition consult.    Hypokalemia/hyponatremia correct with HD      Code Status : full code  Family Communication  : Spoke in detail with daughter Delice Bison on the phone  Disposition Plan  : Skilled nursing facility possibly on 6/6 versus 6/7. (Repeat blood culture sent for clearing)  Barriers For Discharge : PT evaluation.  Consults  :   Renal ID Neurosurgery (Dr Wynetta Emery) IR    Procedures  :  CT head  CT lumbar spine  MRI lumbar spine L4-L5 disc aspiration MRI brain Replacement of hemodialysis catheter  DVT Prophylaxis  :   Sq heparin  Lab Results  Component Value Date   PLT 162 05/19/2016    Antibiotics  :    Anti-infectives    Start     Dose/Rate Route Frequency Ordered Stop   05/19/16 1200  vancomycin (VANCOCIN) 500 mg in sodium chloride 0.9 % 100 mL IVPB     500 mg 100 mL/hr over 60 Minutes Intravenous Every M-W-F (Hemodialysis) 05/18/16 1102     05/19/16 1200  ceFAZolin (ANCEF) IVPB 2g/100 mL premix     2 g 200 mL/hr over 30 Minutes Intravenous Every M-W-F (Hemodialysis) 05/18/16 1105       05/18/16 1130  vancomycin (VANCOCIN) 1,250 mg in sodium chloride 0.9 % 250 mL IVPB  1,250 mg 166.7 mL/hr over 90 Minutes Intravenous NOW 05/18/16 1055 05/18/16 1352   05/16/16 1815  fluconazole (DIFLUCAN) tablet 100 mg     100 mg Oral Daily 05/16/16 1809 05/23/16 0959   05/16/16 1800  ceFAZolin (ANCEF) IVPB 1 g/50 mL premix     1 g 100 mL/hr over 30 Minutes Intravenous Every 24 hours 05/16/16 1634 05/18/16 2124   05/16/16 1200  vancomycin (VANCOCIN) 500 mg in sodium chloride 0.9 % 100 mL IVPB  Status:  Discontinued     500 mg 100 mL/hr over 60 Minutes Intravenous Every M-W-F (Hemodialysis) 05/14/16 1907 05/15/16 1506   05/14/16 1745  vancomycin (VANCOCIN) IVPB 1000 mg/200 mL premix     1,000 mg 200 mL/hr over 60 Minutes Intravenous  Once 05/14/16 1736 05/14/16 1914        Objective:   Filed Vitals:   05/19/16 1435 05/19/16 1501 05/19/16 1507 05/19/16 1508  BP: 136/67 139/79 132/74 132/74  Pulse: 64 63 61 61  Temp: 98.2 F (36.8 C) 97 F (36.1 C)    TempSrc:  Oral    Resp: 18 18 18 18   Height:      Weight:  54.8 kg (120 lb 13 oz)    SpO2: 100% 100% 100% 100%    Wt Readings from Last 3 Encounters:  05/19/16 54.8 kg (120 lb 13 oz)  04/11/16 51.8 kg (114 lb 3.2 oz)  03/05/16 57.1 kg (125 lb 14.1 oz)     Intake/Output Summary (Last 24 hours) at 05/19/16 1531 Last data filed at 05/19/16 1400  Gross per 24 hour  Intake    570 ml  Output     20 ml  Net    550 ml     Physical Exam  Gen: not in distress, HEENT: moist mucosa, supple neck Chest: clear b/l, no added sounds CVS: N S1&S2, no murmurs, rubs or gallop GI: soft, NT, ND, BS+ Musculoskeletal: warm, no edema,  CNS: Alert and oriented, nonfocal    Data Review:    CBC  Recent Labs Lab 05/14/16 1551 05/15/16 2020 05/16/16 0945 05/19/16 0625  WBC 8.1 8.1 5.7 3.8*  HGB 10.1* 8.5* 9.0* 9.6*  HCT 31.9* 26.8* 28.7* 31.2*  PLT 174 146* 141* 162  MCV 88.9 89.3 92.0 90.2  MCH 28.1 28.3 28.8 27.7   MCHC 31.7 31.7 31.4 30.8  RDW 16.6* 16.6* 16.6* 16.4*  LYMPHSABS 0.6* 1.0  --   --   MONOABS 0.6 0.9  --   --   EOSABS 0.0 0.0  --   --   BASOSABS 0.0 0.0  --   --     Chemistries   Recent Labs Lab 05/14/16 1551 05/15/16 2021 05/16/16 0945 05/19/16 0625  NA 135 133* 136 137  K 2.9* 2.8* 3.0* 3.1*  CL 97* 97* 101 102  CO2 28 27 29 29   GLUCOSE 86 87 75 192*  BUN 11 21* 14 16  CREATININE 2.24* 2.89* 2.12* 2.74*  CALCIUM 7.8* 7.7* 7.4* 7.6*   ------------------------------------------------------------------------------------------------------------------ No results for input(s): CHOL, HDL, LDLCALC, TRIG, CHOLHDL, LDLDIRECT in the last 72 hours.  Lab Results  Component Value Date   HGBA1C 5.5 03/31/2016   ------------------------------------------------------------------------------------------------------------------ No results for input(s): TSH, T4TOTAL, T3FREE, THYROIDAB in the last 72 hours.  Invalid input(s): FREET3 ------------------------------------------------------------------------------------------------------------------ No results for input(s): VITAMINB12, FOLATE, FERRITIN, TIBC, IRON, RETICCTPCT in the last 72 hours.  Coagulation profile  Recent Labs Lab 05/16/16 0538  INR 1.37    No results for input(s):  DDIMER in the last 72 hours.  Cardiac Enzymes No results for input(s): CKMB, TROPONINI, MYOGLOBIN in the last 168 hours.  Invalid input(s): CK ------------------------------------------------------------------------------------------------------------------    Component Value Date/Time   BNP >4500.0* 02/28/2016 2200    Inpatient Medications  Scheduled Meds: . aspirin EC  81 mg Oral Daily  .  ceFAZolin (ANCEF) IV  2 g Intravenous Q M,W,F-HD  . citalopram  20 mg Oral Daily  . feeding supplement  1 Container Oral TID BM  . fluconazole  100 mg Oral Daily  . folic acid  1 mg Oral Daily  . insulin aspart  0-9 Units Subcutaneous TID WC  .  lisinopril  10 mg Oral Daily  . metoprolol succinate  50 mg Oral Daily  . nystatin  5 mL Oral QID  . pantoprazole  20 mg Oral Daily  . sodium chloride flush  3 mL Intravenous Q12H  . vancomycin  500 mg Intravenous Q M,W,F-HD   Continuous Infusions:  PRN Meds:.ALPRAZolam, lidocaine, oxyCODONE  Micro Results Recent Results (from the past 240 hour(s))  Blood culture (routine x 2)     Status: Abnormal   Collection Time: 05/14/16  4:10 PM  Result Value Ref Range Status   Specimen Description BLOOD LEFT ANTECUBITAL  Final   Special Requests BOTTLES DRAWN AEROBIC AND ANAEROBIC 5CC  Final   Culture  Setup Time   Final    GRAM NEGATIVE RODS AEROBIC BOTTLE ONLY CRITICAL RESULT CALLED TO, READ BACK BY AND VERIFIED WITH: L. Bajbus Pharm.D. 16:20 05/15/16 ([email or web address])    Culture KLEBSIELLA PNEUMONIAE (A)  Final   Report Status 05/17/2016 FINAL  Final   Organism ID, Bacteria KLEBSIELLA PNEUMONIAE  Final      Susceptibility   Klebsiella pneumoniae - MIC*    AMPICILLIN >=32 RESISTANT Resistant     CEFAZOLIN <=4 SENSITIVE Sensitive     CEFEPIME <=1 SENSITIVE Sensitive     CEFTAZIDIME <=1 SENSITIVE Sensitive     CEFTRIAXONE <=1 SENSITIVE Sensitive     CIPROFLOXACIN <=0.25 SENSITIVE Sensitive     GENTAMICIN <=1 SENSITIVE Sensitive     IMIPENEM <=0.25 SENSITIVE Sensitive     TRIMETH/SULFA <=20 SENSITIVE Sensitive     AMPICILLIN/SULBACTAM 8 SENSITIVE Sensitive     PIP/TAZO <=4 SENSITIVE Sensitive     * KLEBSIELLA PNEUMONIAE  Blood Culture ID Panel (Reflexed)     Status: Abnormal   Collection Time: 05/14/16  4:10 PM  Result Value Ref Range Status   Enterococcus species NOT DETECTED NOT DETECTED Final   Vancomycin resistance NOT DETECTED NOT DETECTED Final   Listeria monocytogenes NOT DETECTED NOT DETECTED Final   Staphylococcus species NOT DETECTED NOT DETECTED Final   Staphylococcus aureus NOT DETECTED NOT DETECTED Final   Methicillin resistance NOT DETECTED NOT DETECTED Final   Streptococcus  species NOT DETECTED NOT DETECTED Final   Streptococcus agalactiae NOT DETECTED NOT DETECTED Final   Streptococcus pneumoniae NOT DETECTED NOT DETECTED Final   Streptococcus pyogenes NOT DETECTED NOT DETECTED Final   Acinetobacter baumannii NOT DETECTED NOT DETECTED Final   Enterobacteriaceae species DETECTED (A) NOT DETECTED Final    Comment: CRITICAL RESULT CALLED TO, READ BACK BY AND VERIFIED WITH: L. Bajbus Pharm.D. 16:20 05/15/16 ([email or web address])    Enterobacter cloacae complex NOT DETECTED NOT DETECTED Final   Escherichia coli NOT DETECTED NOT DETECTED Final   Klebsiella oxytoca NOT DETECTED NOT DETECTED Final   Klebsiella pneumoniae DETECTED (A) NOT DETECTED Final    Comment: CRITICAL RESULT CALLED TO, READ BACK BY  AND VERIFIED WITH: L. Bajbus Pharm.D. 16:20 05/15/16 ([email or web address])    Proteus species NOT DETECTED NOT DETECTED Final   Serratia marcescens NOT DETECTED NOT DETECTED Final   Carbapenem resistance NOT DETECTED NOT DETECTED Final   Haemophilus influenzae NOT DETECTED NOT DETECTED Final   Neisseria meningitidis NOT DETECTED NOT DETECTED Final   Pseudomonas aeruginosa NOT DETECTED NOT DETECTED Final   Candida albicans NOT DETECTED NOT DETECTED Final   Candida glabrata NOT DETECTED NOT DETECTED Final   Candida krusei NOT DETECTED NOT DETECTED Final   Candida parapsilosis NOT DETECTED NOT DETECTED Final   Candida tropicalis NOT DETECTED NOT DETECTED Final  Blood culture (routine x 2)     Status: None   Collection Time: 05/14/16  6:00 PM  Result Value Ref Range Status   Specimen Description BLOOD RIGHT HAND  Final   Special Requests BOTTLES DRAWN AEROBIC AND ANAEROBIC 10CC  Final   Culture NO GROWTH 5 DAYS  Final   Report Status 05/19/2016 FINAL  Final  Urine culture     Status: Abnormal   Collection Time: 05/15/16  4:39 AM  Result Value Ref Range Status   Specimen Description URINE, RANDOM  Final   Special Requests NONE  Final   Culture <10,000 COLONIES/mL INSIGNIFICANT GROWTH (A)   Final   Report Status 05/16/2016 FINAL  Final  Aerobic / Anaerobic Culture     Status: None (Preliminary result)   Collection Time: 05/16/16 10:38 AM  Result Value Ref Range Status   Specimen Description ABSCESS  Final   Special Requests L4 TO L5 DISCITIS  Final   Gram Stain   Final    FEW WBC PRESENT, PREDOMINANTLY PMN RARE GRAM POSITIVE COCCI IN PAIRS    Culture   Final    FEW STAPHYLOCOCCUS SPECIES (COAGULASE NEGATIVE) NO ANAEROBES ISOLATED; CULTURE IN PROGRESS FOR 5 DAYS    Report Status PENDING  Incomplete   Organism ID, Bacteria STAPHYLOCOCCUS SPECIES (COAGULASE NEGATIVE)  Final      Susceptibility   Staphylococcus species (coagulase negative) - MIC*    CIPROFLOXACIN >=8 RESISTANT Resistant     ERYTHROMYCIN >=8 RESISTANT Resistant     GENTAMICIN <=0.5 SENSITIVE Sensitive     OXACILLIN >=4 RESISTANT Resistant     TETRACYCLINE 2 SENSITIVE Sensitive     VANCOMYCIN 1 SENSITIVE Sensitive     TRIMETH/SULFA 160 RESISTANT Resistant     CLINDAMYCIN >=8 RESISTANT Resistant     RIFAMPIN <=0.5 SENSITIVE Sensitive     Inducible Clindamycin NEGATIVE Sensitive     * FEW STAPHYLOCOCCUS SPECIES (COAGULASE NEGATIVE)  Cath Tip Culture     Status: None (Preliminary result)   Collection Time: 05/16/16  2:51 PM  Result Value Ref Range Status   Specimen Description CATH TIP  Final   Special Requests NONE  Final   Culture NO GROWTH 3 DAYS  Final   Report Status PENDING  Incomplete  Surgical pcr screen     Status: None   Collection Time: 05/19/16  2:06 AM  Result Value Ref Range Status   MRSA, PCR NEGATIVE NEGATIVE Final   Staphylococcus aureus NEGATIVE NEGATIVE Final    Comment:        The Xpert SA Assay (FDA approved for NASAL specimens in patients over 17 years of age), is one component of a comprehensive surveillance program.  Test performance has been validated by [facility] for patients greater than or equal to 49 year old. It is not intended to diagnose infection nor  to guide  or monitor treatment.     Radiology Reports Ct Head Wo Contrast  05/14/2016  CLINICAL DATA:  Fall today. Altered mental status. Agitation/confusion. 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 EXAM: CT HEAD WITHOUT CONTRAST TECHNIQUE: Contiguous axial images were obtained from the base of the skull through the vertex without intravenous contrast. COMPARISON:  05/09/2016 FINDINGS: Small remote lacunar infarcts in the left lentiform nucleus. Otherwise, the brainstem, cerebellum, cerebral peduncles, thalami, basal ganglia, basilar cisterns, and ventricular system appear within normal limits. Periventricular white matter and corona radiata hypodensities favor chronic ischemic microvascular white matter disease. No intracranial hemorrhage, mass lesion, or acute CVA. IMPRESSION: 1. No acute intracranial findings. 2. Periventricular white matter and corona radiata hypodensities favor chronic ischemic microvascular white matter disease. 3. 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 Electronically Signed   By: Gaylyn Rong M.D.   On: 05/14/2016 16:51   Ct Lumbar Spine Wo Contrast  05/14/2016  CLINICAL DATA:  Fall on Tuesday. Pain in the back. This is worsened. EXAM: CT LUMBAR SPINE WITHOUT CONTRAST TECHNIQUE: Multidetector CT imaging of the lumbar spine was performed without intravenous contrast administration. Multiplanar CT image reconstructions were also generated. COMPARISON:  05/09/2016 FINDINGS: Acute 15% superior endplate compression fracture at T12 with endplate sclerosis and some cortical irregularity. No significant bony retropulsion. This is new compared to the MRI from 04/05/2016. Abnormal erosive findings along the inferior endplate of L4 and superior endplate of L5. These lack the characteristic sclerotic margin for Schmorl's nodes, and moreover there is some sclerosis and interval flattening along the anterior superior endplate of L5 compared to the 04/05/2016 exam. These  heighten my concern that the findings at L4-5 are probably due to discitis -osteomyelitis. There is also prominence of the paraspinal tissues at this level deep to the psoas muscles, probably inflammatory. There is also a suggestion of degenerative disc disease at this level potentially causing foraminal impingement. Strictly speaking I cannot exclude epidural abscess given the slight epidural prominence in this vicinity and concern for discitis. Along the inferior endplate of L3 there is a more typical appearing Schmorl' s node, with sclerotic margin. No vertebral subluxation is observed. The is a right twelfth rib fracture posteriorly on image 39/2 L1. Bridging spurring of the left sacroiliac joint. Aortoiliac atherosclerotic vascular disease. Bilateral pleural effusions. Bilateral perirenal stranding. Diffuse subcutaneous and mesenteric edema. IMPRESSION: 1. 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 2. There is also a new 15% superior endplate compression fracture T12 as well as a nondisplaced fracture of the right T12 rib posteriorly. 3. Bilateral pleural effusions are at least moderate in size but only partially characterized on today' s exam. 4. Perirenal, subcutaneous, and mesenteric edema. Electronically Signed   By: Gaylyn Rong M.D.   On: 05/14/2016 17:10   Mr Brain Wo Contrast  05/17/2016  CLINICAL DATA:  Sepsis. Discitis and osteomyelitis. Infected dialysis catheter. Acute encephalopathy. New onset dysphagia. EXAM: MRI HEAD WITHOUT CONTRAST TECHNIQUE: Multiplanar, multiecho pulse sequences of the brain and surrounding structures were obtained without intravenous contrast. COMPARISON:  CT head without contrast 05/14/2016. FINDINGS: Moderate  generalized atrophy and diffuse white matter disease is present. A remote lacunar infarct is present at the right thalamus. Remote asymmetric white matter ischemia is present in the anterior left frontal lobe. No acute infarct, hemorrhage, or mass  lesion is present. The ventricles are proportionate to the degree of atrophy. No significant extra-axial fluid collection is present. The internal auditory canals are normal bilaterally. Flow is present in the major intracranial arteries. Globes and orbits are intact. The paranasal sinuses are clear. There is some fluid in the posterior mastoid air cells bilaterally. No obstructing nasopharyngeal lesion is present. The skullbase is within normal limits. Intracranial midline sagittal images are unremarkable. Grade 1 anterolisthesis is present at C2-3. A broad-based disc osteophyte complex is present at C3-4. IMPRESSION: 1. Moderate generalized atrophy and diffuse white matter disease without acute intracranial abnormality. 2. Remote lacunar infarct of the right thalamus. 3. Small bilateral posterior mastoid effusions. No obstructing nasopharyngeal lesion is present. 4. Spondylosis of the upper cervical spine as described. Electronically Signed   By: Marin Roberts M.D.   On: 05/17/2016 11:49   Mr Lumbar Spine Wo Contrast  05/14/2016  CLINICAL DATA:  Discitis on treatment for 1 month. Increased confusion and fever. Abnormal CT scan. EXAM: MRI LUMBAR SPINE WITHOUT CONTRAST TECHNIQUE: Multiplanar, multisequence MR imaging of the lumbar spine was performed. No intravenous contrast was administered. COMPARISON:  CT of the lumbar spine 05/14/2016. MRI of the lumbar spine 04/05/2016. FINDINGS: Segmentation: 5 non rib-bearing lumbar type vertebral bodies are present. Alignment: AP alignment is anatomic. Leftward curvature of the lumbar spine is centered at L4-5. Vertebrae: A superior endplate fracture at T12 is again seen, incompletely healed. Diffuse edematous changes are  present at L4-5. Conus medullaris: Extends to the T12-L1 level and appears normal. Paraspinal and other soft tissues: Edematous paraspinous changes are present at L4-5 with extension into the psoas musculature. There is no discrete abscess. A 10 mm cyst is present at the lower pole of the left kidney. No other focal solid organ lesions are present. Disc levels: The disc levels at L3-4 above are normal. L3-4: A broad-based disc protrusion is present. Moderate facet hypertrophy is noted. This results in mild central and bilateral foraminal stenosis. L4-5: There is further destruction of endplates. Abnormal signal is present within the disc space and extending into the epidural space on the left. Disc fragment or epidural abscess extends superiorly along the posterior margin of the left L4 vertebral body 2.5 cm from the inferior endplate of L4. Moderate left subarticular stenosis is present. Moderate left and mild right foraminal narrowing is present. L5-S1: Moderate facet hypertrophy is present bilaterally. There is mild subarticular narrowing on both sides. IMPRESSION: 1. 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 2. Infectious or inflammatory material extends posteriorly from the disc space into the epidural space with superior extension 2.5 cm from the inferior endplate of L4 within the left lateral recess. 3. Contrast could not be given due to renal insufficiency and a GFR of 28. 4. Mild central and bilateral foraminal stenosis at L3-4. 5. Moderate left and mild right foraminal stenosis at L4-5. 6. Mild subarticular narrowing bilaterally at L5-S1 secondary to moderate facet hypertrophy and spurring. Electronically Signed   By: Marin Roberts M.D.   On: 05/14/2016 21:40   Dg Esophagus  05/19/2016  CLINICAL DATA:  75 year old male with dysphagia thought related to the esophagus. Regurgitation of solids and liquids. Initial encounter. EXAM:  ESOPHOGRAM/BARIUM SWALLOW TECHNIQUE: Single contrast examination was performed using  thin barium. FLUOROSCOPY TIME:  Radiation Exposure Index (as provided by the fluoroscopic device): If the device does not provide the exposure index: Fluoroscopy Time:  2 minutes 6 seconds COMPARISON:  05/16/2016 FINDINGS:  The patient was more cooperative today, and a better quality exam resulted. However, the still took fairly small swallows of barium. 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 No obstruction to the forward flow of contrast throughout the esophagus and into the stomach. Normal esophageal course and contour. The gastroesophageal junction appear normal. A 12.5 mm barium tablet was administered and passed rapidly to the distal esophagus. It state in the distal esophagus just proximal to the gastroesophageal junction for several minutes (series 17, image 1), but following additional sips of water and after another few minutes it had passed into the stomach. IMPRESSION: Presbyesophagus. No fixed areas of esophageal narrowing or stenosis. No hiatal hernia. Passage of a 12.5 mm barium tablet through the distal esophagus was mildly delayed. Electronically Signed   By: Odessa Fleming M.D.   On: 05/19/2016 09:52   Dg Esophagus  05/16/2016  CLINICAL DATA:  Difficulty swallowing. EXAM: ESOPHOGRAM/BARIUM SWALLOW TECHNIQUE: Single contrast examination was performed using  thin barium. FLUOROSCOPY TIME:  Radiation Exposure Index (as provided by the fluoroscopic device): If the device does not provide the exposure index: Fluoroscopy Time:  1 minutes and 36 seconds Number of Acquired Images: COMPARISON:  None. FINDINGS: Patient had difficulty swallowing thin barium. Piecemeal swallowing reveals no gross esophageal stricture, diverticulum, or mass-effect. Patient was given a 13 mm barium tablet, but was unable to swallow without chewing tablet first. IMPRESSION: Very limited exam  secondary to piecemeal swallowing and inability to swallow a tablet whole. No gross esophageal abnormality evident. Consider repeat evaluation after patient recovers from acute clinical picture. Electronically Signed   By: Kennith Center M.D.   On: 05/16/2016 16:38   Ir Fluoro Guide Ndl Plmt / Bx  05/16/2016  INDICATION: 75 year old male with a history of L4-L5 discitis/osteomyelitis. He has been referred for an aspiration for culture. EXAM: IR FLUORO GUIDE NEEDLE PLACEMENT /BIOPSY MEDICATIONS: None. ANESTHESIA/SEDATION: Moderate (conscious) sedation was employed during this procedure. A total of Versed 1.0 mg and Fentanyl 50 mcg was administered intravenously. Moderate Sedation Time: 20 minutes. The patient's level of consciousness and vital signs were monitored continuously by radiology nursing throughout the procedure under my direct supervision. FLUOROSCOPY TIME:  Fluoroscopy Time: 1 minutes 48 seconds (6 mGy). COMPLICATIONS: None PROCEDURE: Informed written consent was obtained from the patient after a thorough discussion of the procedural risks, benefits and alternatives. All questions were addressed. Maximal Sterile Barrier Technique was utilized including caps, mask, sterile gowns, sterile gloves, sterile drape, hand hygiene and skin antiseptic. A timeout was performed prior to the initiation of the procedure. Patient is position prone position on the fluoroscopy table. The lumbar region was prepped and draped in the usual sterile fashion. Once an oblique angle approach from the left posterior oblique was determined, the skin and subcutaneous tissues were generously infiltrated with 1% lidocaine for local anesthesia. A 20 cm 21 gauge Franceen needle was advanced under fluoroscopy into the disc space at L4-L5. Orthogonal images were acquired. Aspirate was achieved. Patient tolerated the procedure well and remained hemodynamically stable throughout. No complications were encountered and no significant blood  loss encountered. IMPRESSION: Status post fluoroscopic guided disc aspirate at the L4-L5 level. Sample sent the lab for analysis. Signed, Yvone Neu. Loreta Ave, DO [facility] Electronically Signed   By: Gilmer Mor D.O.   On: 05/16/2016 13:34   Dg Chest Port 1 View  05/19/2016  CLINICAL DATA:  Status post dialysis catheter placement EXAM: PORTABLE CHEST 1  VIEW COMPARISON:  05/09/2016 FINDINGS: There is a right IJ catheter with tip in the projection of the right atrium. The heart size is normal. Bilateral pleural effusions and moderate interstitial edema identified compatible with fluid overload. IMPRESSION: Moderate fluid overload. The right IJ catheter tip is in the right atrium. No complications after catheter insertion Electronically Signed   By: Signa Kell M.D.   On: 05/19/2016 15:00    Time Spent in minutes  25   Eddie North M.D on 05/19/2016 at 3:31 PM  Between 7am to 7pm - Pager - [phone]  After 7pm go to [email or web address] - password [facility]  [facility] -  Office  [phone]

## 2016-05-19 NOTE — Clinical Social Work Note (Signed)
Clinical Social Work Assessment  Patient Details  Name: Jeffrey Walls MRN: [id number] Date of Birth: 07-Aug-1941  Date of referral:  05/19/16               Reason for consult:  Facility Placement                Permission sought to share information with:  Family Supports Permission granted to share information::  Yes, Verbal Permission Granted  Name::     Delice Bisonara  [facility]  Relationship::  dtr  Contact Information:     Housing/Transportation Living arrangements for the past 2 months:  Single Family Home, Skilled Nursing Facility Source of Information:  Patient, Adult Children Patient Interpreter Needed:  None Criminal Activity/Legal Involvement Pertinent to Current Situation/Hospitalization:  No - Comment as needed Significant Relationships:  Adult Children Lives with:  Self Do you feel safe going back to the place where you live?  No Need for family participation in patient care:  Yes (Comment) (help with decision making)  Care giving concerns:  Pt lives at home alone and presents with reduced mobility   Office managerocial Worker assessment / plan:  CSW spoke with pt about MD recommendation for SNF placement- pt has been to SNF before in [place].  CSW received verbal permission to discuss with pt dtr- dtr reports that [place] come live with her in [place] after [place] finishes with rehab.  Employment status:  Retired Database administratornsurance information:  Managed Medicare PT Recommendations:  Not assessed at this time Information / Referral to community resources:  Skilled Nursing Facility  Patient/Family's Response to care: Pt and dtr agreeable to short term placement- [facility] and hopeful to return.  Patient/Family's Understanding of and Emotional Response to Diagnosis, Current Treatment, and Prognosis:  Dtr expresses concerns about potential DC tomorrow- states she had been told pt would be here for weeks and does not pt discharged prematurely.  Emotional  Assessment Appearance:  Appears stated age Attitude/Demeanor/Rapport:   (grumpy) Affect (typically observed):  Accepting, Appropriate, Irritable Orientation:  Oriented to Self, Oriented to Place Alcohol / Substance use:  Not Applicable Psych involvement (Current and /or in the community):  No (Comment)  Discharge Needs  Concerns to be addressed:  Home Safety Concerns, Care Coordination Readmission within the last 30 days:  No Current discharge risk:  Physical Impairment Barriers to Discharge:  Continued Medical Work up, [employer]urance Authorization   [place], [place]otoJenna M, LCSW 05/19/2016, 11:06 AM

## 2016-05-19 NOTE — H&P (Signed)
Patient long-standing end-stage renal disease had a recent removal of the catheter with infection. Here today for catheter replacement  Past Medical History  Diagnosis Date  . Renal disorder   . Hypertension   . Diabetes mellitus without complication (HCC)   . Coronary artery disease   . Shortness of breath dyspnea   . ANCA-associated vasculitis (HCC)   . 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 Smoking status: Never Smoker   . Smokeless tobacco: Not on file  . Alcohol Use: No    History reviewed. No pertinent family history.  Allergies  Allergen Reactions  . Ampicillin Rash    Ankle swelling  . Benadryl [Diphenhydramine Hcl] Itching    Rash     Current facility-administered medications:  .  [MAR Hold] ALPRAZolam (XANAX) tablet 0.25 mg, 0.25 mg, Oral, BID PRN, Nishant Dhungel, MD, 0.25 mg at 05/18/16 2324 .  [MAR Hold] aspirin EC tablet 81 mg, 81 mg, Oral, Daily, Leatha Gildingostin M Gherghe, MD, 81 mg at 05/19/16 0940 .  [MAR Hold] ceFAZolin (ANCEF) IVPB 2g/100 mL premix, 2 g, Intravenous, Q M,W,F-HD, Tamera Reasonuth P Clark, RPH, 2 g at 05/19/16 1100 .  [MAR Hold] citalopram (CELEXA) tablet 20 mg, 20 mg, Oral, Daily, Leatha Gildingostin M Gherghe, MD, 20 mg at 05/19/16 0939 .  [MAR Hold] feeding supplement (BOOST / RESOURCE BREEZE) liquid 1 Container, 1 Container, Oral, TID BM, Nishant Dhungel, MD, 1 Container at 05/19/16 1059 .  [MAR Hold] fentaNYL (SUBLIMAZE) injection, , Intravenous, PRN, Gilmer MorJaime Wagner, DO, 25 mcg at 05/16/16 0847 .  [MAR Hold] fluconazole (DIFLUCAN) tablet 100 mg, 100 mg, Oral, Daily, Meryl DareMalcolm T Stark, MD, 100 mg at 05/19/16 0940 .  [MAR Hold] folic acid (FOLVITE) tablet 1 mg, 1 mg, Oral, Daily, Costin Otelia SergeantM Gherghe, MD, 1 mg at 05/19/16 0940 .  [MAR Hold] insulin aspart (novoLOG) injection 0-9 Units, 0-9 Units, Subcutaneous, TID WC, Costin Otelia SergeantM Gherghe, MD, 2 Units at 05/19/16 0940 .  [MAR Hold] lidocaine (XYLOCAINE) 2 % injection 0-20 mL, 0-20 mL,  Intradermal, Once PRN, Ames CoupeSamantha J Rhyne, PA-C .  [MAR Hold] lisinopril (PRINIVIL,ZESTRIL) tablet 10 mg, 10 mg, Oral, Daily, Nishant Dhungel, MD, 10 mg at 05/19/16 0939 .  [MAR Hold] metoprolol succinate (TOPROL-XL) 24 hr tablet 50 mg, 50 mg, Oral, Daily, Delano Metzobert Schertz, MD, 50 mg at 05/19/16 0940 .  [MAR Hold] midazolam (VERSED) injection, , Intravenous, PRN, Gilmer MorJaime Wagner, DO, 1 mg at 05/16/16 0847 .  [MAR Hold] nystatin (MYCOSTATIN) 100000 UNIT/ML suspension 500,000 Units, 5 mL, Oral, QID, Meryl DareMalcolm T Stark, MD, 500,000 Units at 05/19/16 0940 .  [MAR Hold] oxyCODONE (Oxy IR/ROXICODONE) immediate release tablet 5 mg, 5 mg, Oral, Q4H PRN, Leatha Gildingostin M Gherghe, MD, 5 mg at 05/19/16 1059 .  [MAR Hold] pantoprazole (PROTONIX) EC tablet 20 mg, 20 mg, Oral, Daily, Leatha Gildingostin M Gherghe, MD, 20 mg at 05/19/16 0940 .  [MAR Hold] sodium chloride flush (NS) 0.9 % injection 3 mL, 3 mL, Intravenous, Q12H, Leatha Gildingostin M Gherghe, MD, 3 mL at 05/19/16 0941 .  [MAR Hold] vancomycin (VANCOCIN) 500 mg in sodium chloride 0.9 % 100 mL IVPB, 500 mg, Intravenous, Q M,W,F-HD, Nishant Dhungel, MD, 500 mg at 05/19/16 1100  Filed Vitals:   05/18/16 1744 05/18/16 2232 05/19/16 0534 05/19/16 0937  BP: 139/83 138/69 139/86 144/78  Pulse: 68 71 70 77  Temp: 97.7 F (36.5 C) 98 F (36.7 C) 98.2 F (36.8 C)   TempSrc:  Resp: Height:      Weight:      SpO2: 92% 100% 100% 96%    Body mass index is 19.44 kg/(m^2).       Oriented. Alert. No evidence of infection at the neck area.  For new hemodialysis catheter today

## 2016-05-20 LAB — GLUCOSE, CAPILLARY
GLUCOSE-CAPILLARY: 104 mg/dL — AB (ref 65–99)
GLUCOSE-CAPILLARY: 204 mg/dL — AB (ref 65–99)
GLUCOSE-CAPILLARY: 75 mg/dL (ref 65–99)
Glucose-Capillary: 158 mg/dL — ABNORMAL HIGH (ref 65–99)

## 2016-05-20 NOTE — Progress Notes (Signed)
[facility]   Reason for visit: Follow up on discitis  Interval History: culture with CoNS again. Afebrile.  Multiple complaints.   Physical Exam: Constitutional:  Filed Vitals:   05/20/16 0559 05/20/16 0929  BP: 146/74 152/69  Pulse: 65 71  Temp: 98 F (36.7 C)   Resp: 16 16   patient appears in NAD Respiratory: Normal respiratory effort; CTA B Cardiovascular: RRR GI: soft, nt  Review of Systems: Constitutional: negative for fevers and chills Gastrointestinal: negative for diarrhea  Lab Results  Component Value Date   WBC 3.8* 05/19/2016   HGB 9.6* 05/19/2016   HCT 31.2* 05/19/2016   MCV 90.2 05/19/2016   PLT 162 05/19/2016    Lab Results  Component Value Date   CREATININE 2.74* 05/19/2016   BUN 16 05/19/2016   NA 137 05/19/2016   K 3.1* 05/19/2016   CL 102 05/19/2016   CO2 29 05/19/2016    Lab Results  Component Value Date   ALT 19 03/30/2016   AST 26 03/30/2016   ALKPHOS 102 03/30/2016     Microbiology: Recent Results (from the past 240 hour(s))  Blood culture (routine x 2)     Status: Abnormal   Collection Time: 05/14/16  4:10 PM  Result Value Ref Range Status   Specimen Description BLOOD LEFT ANTECUBITAL  Final   Special Requests BOTTLES DRAWN AEROBIC AND ANAEROBIC 5CC  Final   Culture  Setup Time   Final    GRAM NEGATIVE RODS AEROBIC BOTTLE ONLY CRITICAL RESULT CALLED TO, READ BACK BY AND VERIFIED WITH: L. Bajbus Pharm.D. 16:20 05/15/16 ([email or web address])    Culture KLEBSIELLA PNEUMONIAE (A)  Final   Report Status 05/17/2016 FINAL  Final   Organism ID, Bacteria KLEBSIELLA PNEUMONIAE  Final      Susceptibility   Klebsiella pneumoniae - MIC*    AMPICILLIN >=32 RESISTANT Resistant     CEFAZOLIN <=4 SENSITIVE Sensitive     CEFEPIME <=1 SENSITIVE Sensitive     CEFTAZIDIME <=1 SENSITIVE Sensitive     CEFTRIAXONE <=1 SENSITIVE Sensitive     CIPROFLOXACIN <=0.25 SENSITIVE Sensitive     GENTAMICIN <=1 SENSITIVE Sensitive    IMIPENEM <=0.25 SENSITIVE Sensitive     TRIMETH/SULFA <=20 SENSITIVE Sensitive     AMPICILLIN/SULBACTAM 8 SENSITIVE Sensitive     PIP/TAZO <=4 SENSITIVE Sensitive     * KLEBSIELLA PNEUMONIAE  Blood Culture ID Panel (Reflexed)     Status: Abnormal   Collection Time: 05/14/16  4:10 PM  Result Value Ref Range Status   Enterococcus species NOT DETECTED NOT DETECTED Final   Vancomycin resistance NOT DETECTED NOT DETECTED Final   Listeria monocytogenes NOT DETECTED NOT DETECTED Final   Staphylococcus species NOT DETECTED NOT DETECTED Final   Staphylococcus aureus NOT DETECTED NOT DETECTED Final   Methicillin resistance NOT DETECTED NOT DETECTED Final   Streptococcus species NOT DETECTED NOT DETECTED Final   Streptococcus agalactiae NOT DETECTED NOT DETECTED Final   Streptococcus pneumoniae NOT DETECTED NOT DETECTED Final   Streptococcus pyogenes NOT DETECTED NOT DETECTED Final   Acinetobacter baumannii NOT DETECTED NOT DETECTED Final   Enterobacteriaceae species DETECTED (A) NOT DETECTED Final    Comment: CRITICAL RESULT CALLED TO, READ BACK BY AND VERIFIED WITH: L. Bajbus Pharm.D. 16:20 05/15/16 ([email or web address])    Enterobacter cloacae complex NOT DETECTED NOT DETECTED Final   Escherichia coli NOT DETECTED NOT DETECTED Final   Klebsiella oxytoca NOT DETECTED NOT DETECTED Final   Klebsiella pneumoniae DETECTED (A) NOT  DETECTED Final    Comment: CRITICAL RESULT CALLED TO, READ BACK BY AND VERIFIED WITH: L. Bajbus Pharm.D. 16:20 05/15/16 ([email or web address])    Proteus species NOT DETECTED NOT DETECTED Final   Serratia marcescens NOT DETECTED NOT DETECTED Final   Carbapenem resistance NOT DETECTED NOT DETECTED Final   Haemophilus influenzae NOT DETECTED NOT DETECTED Final   Neisseria meningitidis NOT DETECTED NOT DETECTED Final   Pseudomonas aeruginosa NOT DETECTED NOT DETECTED Final   Candida albicans NOT DETECTED NOT DETECTED Final   Candida glabrata NOT DETECTED NOT DETECTED Final   Candida krusei NOT  DETECTED NOT DETECTED Final   Candida parapsilosis NOT DETECTED NOT DETECTED Final   Candida tropicalis NOT DETECTED NOT DETECTED Final  Blood culture (routine x 2)     Status: None   Collection Time: 05/14/16  6:00 PM  Result Value Ref Range Status   Specimen Description BLOOD RIGHT HAND  Final   Special Requests BOTTLES DRAWN AEROBIC AND ANAEROBIC 10CC  Final   Culture NO GROWTH 5 DAYS  Final   Report Status 05/19/2016 FINAL  Final  Urine culture     Status: Abnormal   Collection Time: 05/15/16  4:39 AM  Result Value Ref Range Status   Specimen Description URINE, RANDOM  Final   Special Requests NONE  Final   Culture <10,000 COLONIES/mL INSIGNIFICANT GROWTH (A)  Final   Report Status 05/16/2016 FINAL  Final  Aerobic / Anaerobic Culture     Status: None (Preliminary result)   Collection Time: 05/16/16 10:38 AM  Result Value Ref Range Status   Specimen Description ABSCESS  Final   Special Requests L4 TO L5 DISCITIS  Final   Gram Stain   Final    FEW WBC PRESENT, PREDOMINANTLY PMN RARE GRAM POSITIVE COCCI IN PAIRS    Culture   Final    FEW STAPHYLOCOCCUS SPECIES (COAGULASE NEGATIVE) NO ANAEROBES ISOLATED; CULTURE IN PROGRESS FOR 5 DAYS    Report Status PENDING  Incomplete   Organism ID, Bacteria STAPHYLOCOCCUS SPECIES (COAGULASE NEGATIVE)  Final      Susceptibility   Staphylococcus species (coagulase negative) - MIC*    CIPROFLOXACIN >=8 RESISTANT Resistant     ERYTHROMYCIN >=8 RESISTANT Resistant     GENTAMICIN <=0.5 SENSITIVE Sensitive     OXACILLIN >=4 RESISTANT Resistant     TETRACYCLINE 2 SENSITIVE Sensitive     VANCOMYCIN 1 SENSITIVE Sensitive     TRIMETH/SULFA 160 RESISTANT Resistant     CLINDAMYCIN >=8 RESISTANT Resistant     RIFAMPIN <=0.5 SENSITIVE Sensitive     Inducible Clindamycin NEGATIVE Sensitive     * FEW STAPHYLOCOCCUS SPECIES (COAGULASE NEGATIVE)  Cath Tip Culture     Status: None   Collection Time: 05/16/16  2:51 PM  Result Value Ref Range Status    Specimen Description CATH TIP  Final   Special Requests NONE  Final   Culture NO GROWTH 3 DAYS  Final   Report Status 05/19/2016 FINAL  Final  Surgical pcr screen     Status: None   Collection Time: 05/19/16  2:06 AM  Result Value Ref Range Status   MRSA, PCR NEGATIVE NEGATIVE Final   Staphylococcus aureus NEGATIVE NEGATIVE Final    Comment:        The Xpert SA Assay (FDA approved for NASAL specimens in patients over 25 years of age), is one component of a comprehensive surveillance program.  Test performance has been validated by [facility] for patients greater than or equal to 1  year old. It is not intended to diagnose infection nor to guide or monitor treatment.     Impression/Plan:  1. Discitis - persistent despite previous antibiotics.  Will treat for additional 6 weeks with vancomycin with dialysis through 06/29/16 2. Klebsiella bacteremia - repeat blood cultures sent.  Treat for 7 days total with cefazolin.  3. Cardiac vegetation - needs repeat TEE when catheter is removed when the fistula is being used.  This ideally will be prior to stopping antibiotics.    We will arrange follow up in [facility] in about 4 weeks. thanks

## 2016-05-20 NOTE — Progress Notes (Signed)
PROGRESS NOTE                                                                                                                                                                                                             Patient Demographics:    Jeffrey Walls, is a 75 y.o. male, DOB - 1941-05-01, ZOX:[id number]  Admit date - 05/14/2016   Admitting Physician Costin Otelia Sergeant, MD  Outpatient Primary MD for the patient is Theressa Stamps, MD  LOS - 6  Outpatient Specialists: Renal Dr. Algis Liming  Chief Complaint  Patient presents with  . Back Pain       Brief Narrative   75 year old male with history of ESRD on dialysis, combined systolic and diastolic CHF, recent prolonged hospitalization at [facility] for coag-negative staph bacteremia, source felt to be from his dialysis catheter which was replaced and placed on IV vancomycin for 6 weeks course (starting 04/04/2016). 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 Patient presented to the ED with low back pain and acute encephalopathy. Due to his progressive back pain he was unable to complete hemodialysis.. In the ED was septic with fever of 101F and mildly tachycardic. Blood work showed hemoglobin of 10.1, potassium of 2.9 and creatinine of 2.24. Lactic acid was normal. Head CT was unremarkable. CT of the lumbar spine was concerning for discitis and osteomyelitis at the L4-5 level with bony destructive changes and unable to exclude epidural abscess. An MRI of the lumbar spine was done which showed progressive discitis at the L4-5 with destruction of endplates and diffuse marrow edema. Also showed infectious/inflammatory material extending posteriorly into the epidural space from the inferior endplate of L4 within the left lateral recess.  Patient admitted to hospitalist service. Neurosurgery and ID consulted.Blood  cultures on admission growing Klebsiella. Dialysis catheter removed on 6/2 and placed a new one on 6/5. Disc aspirate growing coag negative staph aureus.   Subjective:   Back pain better. 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 Disc aspirate growing coag negative staph aureus. Blood culture 1/2 this admission growing Klebsiella. Started on cefazolin. HD catheter removed after dialysis  on 6/2. New catheter  placed on 6/5. -ID following. Seen by neurosurgeon Dr. Wynetta Emery who recommended to continue with aggressive medical management and given lack of focal neurological deficit does not need surgical intervention at this time. -Sepsis  resolved. -On oxycodone when necessary for pain.  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 Discussed the findings with Dr. Ronelle Nigh where performed TEE earlier. It appeared that this could be vegetation caused by impingement of the prior infected HD catheter on the right atrial free wall. Unfortunately the new HD catheter that was placed in is again in the right atrium so a repeat TEE may not give [place] an exact diagnosis (vegetation versus myxoma). Another option is cardiac MRI with contrast. Discussed with nephrology who recommended against imaging with contrast given high risk of fibrosis. Noncontrast MRI would not have a better yield compared to TEE.  Discussed in detail with Dr. Jearld Pies, nephrology (Dr. Marisue Humble) and ID Dr. Luciana Axe. Plan on treating him with 6 more weeks of IV vancomycin. Hopefully by then his fistula would have matured and we will be able to remove the dialysis catheter and plan on repeat TEE. Stop date for IV vancomycin would be 06/29/2016.  i was notified by the daughter today that when she spoke with the in charge at the [facility] , patient  was not getting antibiotics with HD regularly recently because due to his back pain he wasn't able to sit through the entire HD session to get abx at the end of the session. i called the HD center but was unable to reach the charge person ( shabonne murphy--phone [phone]).  Patient will need to be able to sit up for HD before he can be safely released. Given that his back pain is much better now, and able to sit o the reclined without difficulty  i think he can tolerate HD.       Active Problems: Klebsiella bacteremia ? Due to infected HD catheter. ID recommends Ancef for Klebsiella bacteremia for one week duration. ( stop date 05/25/2016) repeat blood culture sent to ensure clearing.  Acute encephalopathy -Secondary to sepsis. MRI brain done negative for acute findings and shows atrophy and old lacunar infarct. Mentation back to normal.  Acute dysphagia Possibly due to oral/esophageal candidiasis. Improved with fluconazole.  ESRD on M, W, F Getting scheduled dialysis here. Renal following . HD catheter replaced today.   coagulase-negative staph bacteremia in April 2017 As outlined above. Completed 6 weeks course of IV vancomycin on 05/16/2016. ( unclear if he received vnacomycin regularly with outpt HD due to back pain). Resumed on 6/4 for 6 weeks course further until 06/29/2616    Anemia of renal disease At baseline.    Essential hypertension Stable on home medications.     Diabetes mellitus with diabetic nephropathy, with long-term current use of insulin (HCC) Stable. Monitor on sliding scale coverage.    Protein-calorie malnutrition, severe Nutrition consult.    Hypokalemia/hyponatremia correct with HD      Code Status : full code  Family Communication  : Spoke in detail with daughter Delice Bison on the phone  Disposition Plan  : confirm to  see if pt able to sit on recliner for 3 hrs. Skilled nursing facility on 6/7. (Repeat blood culture sent for  clearing)  Barriers For Discharge : ensuring pt able to tolerate outpt HD. Stable for d/c on 6/7  Consults  :   Renal ID Neurosurgery (  Dr Wynetta Emery) IR    Procedures  :  CT head  CT lumbar spine  MRI lumbar spine L4-L5 disc aspiration MRI brain Replacement of hemodialysis catheter  DVT Prophylaxis  :   Sq heparin  Lab Results  Component Value Date   PLT 162 05/19/2016    Antibiotics  :    Anti-infectives    Start     Dose/Rate Route Frequency Ordered Stop   05/19/16 1800  vancomycin (VANCOCIN) 500 mg in sodium chloride 0.9 % 100 mL IVPB     500 mg 100 mL/hr over 60 Minutes Intravenous Every M-W-F (Hemodialysis) 05/19/16 1751     05/19/16 1800  ceFAZolin (ANCEF) IVPB 2g/100 mL premix     2 g 200 mL/hr over 30 Minutes Intravenous Every M-W-F (Hemodialysis) 05/19/16 1752     05/19/16 1746  vancomycin (VANCOCIN) 500-5 MG/100ML-% IVPB    Comments:  Woods, [name]   : cabinet override      05/19/16 1746 05/19/16 1806   05/19/16 1200  vancomycin (VANCOCIN) 500 mg in sodium chloride 0.9 % 100 mL IVPB  Status:  Discontinued     500 mg 100 mL/hr over 60 Minutes Intravenous Every M-W-F (Hemodialysis) 05/18/16 1102 05/19/16 1751   05/19/16 1200  ceFAZolin (ANCEF) IVPB 2g/100 mL premix  Status:  Discontinued     2 g 200 mL/hr over 30 Minutes Intravenous Every M-W-F (Hemodialysis) 05/18/16 1105 05/19/16 1752   05/18/16 1130  vancomycin (VANCOCIN) 1,250 mg in sodium chloride 0.9 % 250 mL IVPB     1,250 mg 166.7 mL/hr over 90 Minutes Intravenous NOW 05/18/16 1055 05/18/16 1352   05/16/16 1815  fluconazole (DIFLUCAN) tablet 100 mg     100 mg Oral Daily 05/16/16 1809 05/23/16 0959   05/16/16 1800  ceFAZolin (ANCEF) IVPB 1 g/50 mL premix     1 g 100 mL/hr over 30 Minutes Intravenous Every 24 hours 05/16/16 1634 05/18/16 2124   05/16/16 1200  vancomycin (VANCOCIN) 500 mg in sodium chloride 0.9 % 100 mL IVPB  Status:  Discontinued     500 mg 100 mL/hr over 60 Minutes Intravenous Every  M-W-F (Hemodialysis) 05/14/16 1907 05/15/16 1506   05/14/16 1745  vancomycin (VANCOCIN) IVPB 1000 mg/200 mL premix     1,000 mg 200 mL/hr over 60 Minutes Intravenous  Once 05/14/16 1736 05/14/16 1914        Objective:   Filed Vitals:   05/19/16 2354 05/20/16 0559 05/20/16 0929 05/20/16 1402  BP: 142/70 146/74 152/69 134/72  Pulse: 66 65 71 62  Temp: 97.7 F (36.5 C) 98 F (36.7 C)  97.6 F (36.4 C)  TempSrc: Oral Oral    Resp: 14 16 16 18   Height:      Weight:      SpO2: 98% 100% 100% 96%    Wt Readings from Last 3 Encounters:  05/19/16 52.6 kg (115 lb 15.4 oz)  04/11/16 51.8 kg (114 lb 3.2 oz)  03/05/16 57.1 kg (125 lb 14.1 oz)     Intake/Output Summary (Last 24 hours) at 05/20/16 1505 Last data filed at 05/20/16 0905  Gross per 24 hour  Intake    120 ml  Output   1900 ml  Net  -1780 ml     Physical Exam  Gen: not in distress, HEENT: moist mucosa, supple neck Chest: clear b/l, no added sounds CVS: N S1&S2, no murmurs, rubs or gallop GI: soft, NT, ND, BS+ Musculoskeletal: warm, no edema,  CNS: Alert and oriented, nonfocal  Data Review:    CBC  Recent Labs Lab 05/14/16 1551 05/15/16 2020 05/16/16 0945 05/19/16 0625  WBC 8.1 8.1 5.7 3.8*  HGB 10.1* 8.5* 9.0* 9.6*  HCT 31.9* 26.8* 28.7* 31.2*  PLT 174 146* 141* 162  MCV 88.9 89.3 92.0 90.2  MCH 28.1 28.3 28.8 27.7  MCHC 31.7 31.7 31.4 30.8  RDW 16.6* 16.6* 16.6* 16.4*  LYMPHSABS 0.6* 1.0  --   --   MONOABS 0.6 0.9  --   --   EOSABS 0.0 0.0  --   --   BASOSABS 0.0 0.0  --   --     Chemistries   Recent Labs Lab 05/14/16 1551 05/15/16 2021 05/16/16 0945 05/19/16 0625  NA 135 133* 136 137  K 2.9* 2.8* 3.0* 3.1*  CL 97* 97* 101 102  CO2 GLUCOSE 86 87 75 192*  BUN 11 21* 14 16  CREATININE 2.24* 2.89* 2.12* 2.74*  CALCIUM 7.8* 7.7* 7.4* 7.6*   ------------------------------------------------------------------------------------------------------------------ No results  for input(s): CHOL, HDL, LDLCALC, TRIG, CHOLHDL, LDLDIRECT in the last 72 hours.  Lab Results  Component Value Date   HGBA1C 5.5 03/31/2016   ------------------------------------------------------------------------------------------------------------------ No results for input(s): TSH, T4TOTAL, T3FREE, THYROIDAB in the last 72 hours.  Invalid input(s): FREET3 ------------------------------------------------------------------------------------------------------------------ No results for input(s): VITAMINB12, FOLATE, FERRITIN, TIBC, IRON, RETICCTPCT in the last 72 hours.  Coagulation profile  Recent Labs Lab 05/16/16 0538  INR 1.37    No results for input(s): DDIMER in the last 72 hours.  Cardiac Enzymes No results for input(s): CKMB, TROPONINI, MYOGLOBIN in the last 168 hours.  Invalid input(s): CK ------------------------------------------------------------------------------------------------------------------    Component Value Date/Time   BNP >4500.0* 02/28/2016 2200    Inpatient Medications  Scheduled Meds: . aspirin EC  81 mg Oral Daily  .  ceFAZolin (ANCEF) IV  2 g Intravenous Q M,W,F-HD  . citalopram  20 mg Oral Daily  . feeding supplement  1 Container Oral TID BM  . fluconazole  100 mg Oral Daily  . folic acid  1 mg Oral Daily  . insulin aspart  0-9 Units Subcutaneous TID WC  . lisinopril  10 mg Oral Daily  . metoprolol succinate  50 mg Oral Daily  . nystatin  5 mL Oral QID  . pantoprazole  20 mg Oral Daily  . sodium chloride flush  3 mL Intravenous Q12H  . vancomycin  500 mg Intravenous Q M,W,F-HD   Continuous Infusions:  PRN Meds:.ALPRAZolam, lidocaine, oxyCODONE  Micro Results Recent Results (from the past 240 hour(s))  Blood culture (routine x 2)     Status: Abnormal   Collection Time: 05/14/16  4:10 PM  Result Value Ref Range Status   Specimen Description BLOOD LEFT ANTECUBITAL  Final   Special Requests BOTTLES DRAWN AEROBIC AND ANAEROBIC 5CC   Final   Culture  Setup Time   Final    GRAM NEGATIVE RODS AEROBIC BOTTLE ONLY CRITICAL RESULT CALLED TO, READ BACK BY AND VERIFIED WITH: L. Bajbus Pharm.D. 16:20 05/15/16 ([email or web address])    Culture KLEBSIELLA PNEUMONIAE (A)  Final   Report Status 05/17/2016 FINAL  Final   Organism ID, Bacteria KLEBSIELLA PNEUMONIAE  Final      Susceptibility   Klebsiella pneumoniae - MIC*    AMPICILLIN >=32 RESISTANT Resistant     CEFAZOLIN <=4 SENSITIVE Sensitive     CEFEPIME <=1 SENSITIVE Sensitive     CEFTAZIDIME <=1 SENSITIVE Sensitive     CEFTRIAXONE <=1 SENSITIVE Sensitive  CIPROFLOXACIN <=0.25 SENSITIVE Sensitive     GENTAMICIN <=1 SENSITIVE Sensitive     IMIPENEM <=0.25 SENSITIVE Sensitive     TRIMETH/SULFA <=20 SENSITIVE Sensitive     AMPICILLIN/SULBACTAM 8 SENSITIVE Sensitive     PIP/TAZO <=4 SENSITIVE Sensitive     * KLEBSIELLA PNEUMONIAE  Blood Culture ID Panel (Reflexed)     Status: Abnormal   Collection Time: 05/14/16  4:10 PM  Result Value Ref Range Status   Enterococcus species NOT DETECTED NOT DETECTED Final   Vancomycin resistance NOT DETECTED NOT DETECTED Final   Listeria monocytogenes NOT DETECTED NOT DETECTED Final   Staphylococcus species NOT DETECTED NOT DETECTED Final   Staphylococcus aureus NOT DETECTED NOT DETECTED Final   Methicillin resistance NOT DETECTED NOT DETECTED Final   Streptococcus species NOT DETECTED NOT DETECTED Final   Streptococcus agalactiae NOT DETECTED NOT DETECTED Final   Streptococcus pneumoniae NOT DETECTED NOT DETECTED Final   Streptococcus pyogenes NOT DETECTED NOT DETECTED Final   Acinetobacter baumannii NOT DETECTED NOT DETECTED Final   Enterobacteriaceae species DETECTED (A) NOT DETECTED Final    Comment: CRITICAL RESULT CALLED TO, READ BACK BY AND VERIFIED WITH: L. Bajbus Pharm.D. 16:20 05/15/16 ([email or web address])    Enterobacter cloacae complex NOT DETECTED NOT DETECTED Final   Escherichia coli NOT DETECTED NOT DETECTED Final   Klebsiella oxytoca NOT  DETECTED NOT DETECTED Final   Klebsiella pneumoniae DETECTED (A) NOT DETECTED Final    Comment: CRITICAL RESULT CALLED TO, READ BACK BY AND VERIFIED WITH: L. Bajbus Pharm.D. 16:20 05/15/16 ([email or web address])    Proteus species NOT DETECTED NOT DETECTED Final   Serratia marcescens NOT DETECTED NOT DETECTED Final   Carbapenem resistance NOT DETECTED NOT DETECTED Final   Haemophilus influenzae NOT DETECTED NOT DETECTED Final   Neisseria meningitidis NOT DETECTED NOT DETECTED Final   Pseudomonas aeruginosa NOT DETECTED NOT DETECTED Final   Candida albicans NOT DETECTED NOT DETECTED Final   Candida glabrata NOT DETECTED NOT DETECTED Final   Candida krusei NOT DETECTED NOT DETECTED Final   Candida parapsilosis NOT DETECTED NOT DETECTED Final   Candida tropicalis NOT DETECTED NOT DETECTED Final  Blood culture (routine x 2)     Status: None   Collection Time: 05/14/16  6:00 PM  Result Value Ref Range Status   Specimen Description BLOOD RIGHT HAND  Final   Special Requests BOTTLES DRAWN AEROBIC AND ANAEROBIC 10CC  Final   Culture NO GROWTH 5 DAYS  Final   Report Status 05/19/2016 FINAL  Final  Urine culture     Status: Abnormal   Collection Time: 05/15/16  4:39 AM  Result Value Ref Range Status   Specimen Description URINE, RANDOM  Final   Special Requests NONE  Final   Culture <10,000 COLONIES/mL INSIGNIFICANT GROWTH (A)  Final   Report Status 05/16/2016 FINAL  Final  Aerobic / Anaerobic Culture     Status: None (Preliminary result)   Collection Time: 05/16/16 10:38 AM  Result Value Ref Range Status   Specimen Description ABSCESS  Final   Special Requests L4 TO L5 DISCITIS  Final   Gram Stain   Final    FEW WBC PRESENT, PREDOMINANTLY PMN RARE GRAM POSITIVE COCCI IN PAIRS    Culture   Final    FEW STAPHYLOCOCCUS SPECIES (COAGULASE NEGATIVE) NO ANAEROBES ISOLATED; CULTURE IN PROGRESS FOR 5 DAYS    Report Status PENDING  Incomplete   Organism ID, Bacteria STAPHYLOCOCCUS SPECIES (COAGULASE  NEGATIVE)  Final      Susceptibility  Staphylococcus species (coagulase negative) - MIC*    CIPROFLOXACIN >=8 RESISTANT Resistant     ERYTHROMYCIN >=8 RESISTANT Resistant     GENTAMICIN <=0.5 SENSITIVE Sensitive     OXACILLIN >=4 RESISTANT Resistant     TETRACYCLINE 2 SENSITIVE Sensitive     VANCOMYCIN 1 SENSITIVE Sensitive     TRIMETH/SULFA 160 RESISTANT Resistant     CLINDAMYCIN >=8 RESISTANT Resistant     RIFAMPIN <=0.5 SENSITIVE Sensitive     Inducible Clindamycin NEGATIVE Sensitive     * FEW STAPHYLOCOCCUS SPECIES (COAGULASE NEGATIVE)  Cath Tip Culture     Status: None   Collection Time: 05/16/16  2:51 PM  Result Value Ref Range Status   Specimen Description CATH TIP  Final   Special Requests NONE  Final   Culture NO GROWTH 3 DAYS  Final   Report Status 05/19/2016 FINAL  Final  Surgical pcr screen     Status: None   Collection Time: 05/19/16  2:06 AM  Result Value Ref Range Status   MRSA, PCR NEGATIVE NEGATIVE Final   Staphylococcus aureus NEGATIVE NEGATIVE Final    Comment:        The Xpert SA Assay (FDA approved for NASAL specimens in patients over 43 years of age), is one component of a comprehensive surveillance program.  Test performance has been validated by [facility] for patients greater than or equal to 49 year old. It is not intended to diagnose infection nor to guide or monitor treatment.     Radiology Reports Ct Head Wo Contrast  05/14/2016  CLINICAL DATA:  Fall today. Altered mental status. Agitation/confusion. 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 EXAM: CT HEAD WITHOUT CONTRAST TECHNIQUE: Contiguous axial images were obtained from the base of the skull through the vertex without intravenous contrast. COMPARISON:  05/09/2016 FINDINGS: Small remote lacunar infarcts in the left lentiform nucleus. Otherwise, the brainstem, cerebellum, cerebral peduncles, thalami, basal ganglia, basilar cisterns, and ventricular system appear within normal limits. Periventricular  white matter and corona radiata hypodensities favor chronic ischemic microvascular white matter disease. No intracranial hemorrhage, mass lesion, or acute CVA. IMPRESSION: 1. No acute intracranial findings. 2. Periventricular white matter and corona radiata hypodensities favor chronic ischemic microvascular white matter disease. 3. 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 Electronically Signed   By: Gaylyn Rong M.D.   On: 05/14/2016 16:51   Ct Lumbar Spine Wo Contrast  05/14/2016  CLINICAL DATA:  Fall on Tuesday. Pain in the back. This is worsened. EXAM: CT LUMBAR SPINE WITHOUT CONTRAST TECHNIQUE: Multidetector CT imaging of the lumbar spine was performed without intravenous contrast administration. Multiplanar CT image reconstructions were also generated. COMPARISON:  05/09/2016 FINDINGS: Acute 15% superior endplate compression fracture at T12 with endplate sclerosis and some cortical irregularity. No significant bony retropulsion. This is new compared to the MRI from 04/05/2016. Abnormal erosive findings along the inferior endplate of L4 and superior endplate of L5. These lack the characteristic sclerotic margin for Schmorl's nodes, and moreover there is some sclerosis and interval flattening along the anterior superior endplate of L5 compared to the 04/05/2016 exam. These heighten my concern that the findings at L4-5 are probably due to discitis -osteomyelitis. There is also prominence of the paraspinal tissues at this level deep to the psoas muscles, probably inflammatory. There is also a suggestion of degenerative disc disease at this level potentially causing foraminal impingement. Strictly speaking I cannot exclude epidural abscess given the slight epidural prominence in this vicinity and concern for discitis. Along  the inferior endplate of L3 there is a more typical appearing Schmorl' s node, with sclerotic margin. No vertebral subluxation is observed. The is a right  twelfth rib fracture posteriorly on image 39/2 L1. Bridging spurring of the left sacroiliac joint. Aortoiliac atherosclerotic vascular disease. Bilateral pleural effusions. Bilateral perirenal stranding. Diffuse subcutaneous and mesenteric edema. IMPRESSION: 1. 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 2. There is also a new 15% superior endplate compression fracture T12 as well as a nondisplaced fracture of the right T12 rib posteriorly. 3. Bilateral pleural effusions are at least moderate in size but only partially characterized on today' s exam. 4. Perirenal, subcutaneous, and mesenteric edema. Electronically Signed   By: Gaylyn RongWalter  Liebkemann M.D.   On: 05/14/2016 17:10   Mr Brain Wo Contrast  05/17/2016  CLINICAL DATA:  Sepsis. Discitis and osteomyelitis. Infected dialysis catheter. Acute encephalopathy. New onset dysphagia. EXAM: MRI HEAD WITHOUT CONTRAST TECHNIQUE: Multiplanar, multiecho pulse sequences of the brain and surrounding structures were obtained without intravenous contrast. COMPARISON:  CT head without contrast 05/14/2016. FINDINGS: Moderate generalized atrophy and diffuse white matter disease is present. A remote lacunar infarct is present at the right thalamus. Remote asymmetric white matter ischemia is present in the anterior left frontal lobe. No acute infarct, hemorrhage, or mass lesion is present. The ventricles are proportionate to the degree of atrophy. No significant extra-axial fluid collection is present. The internal auditory canals are normal bilaterally. Flow is present in the major intracranial arteries. Globes and orbits are intact. The paranasal sinuses are clear. There is some fluid in the posterior mastoid air cells bilaterally. No  obstructing nasopharyngeal lesion is present. The skullbase is within normal limits. Intracranial midline sagittal images are unremarkable. Grade 1 anterolisthesis is present at C2-3. A broad-based disc osteophyte complex is present at C3-4. IMPRESSION: 1. Moderate generalized atrophy and diffuse white matter disease without acute intracranial abnormality. 2. Remote lacunar infarct of the right thalamus. 3. Small bilateral posterior mastoid effusions. No obstructing nasopharyngeal lesion is present. 4. Spondylosis of the upper cervical spine as described. Electronically Signed   By: Marin Robertshristopher  Mattern M.D.   On: 05/17/2016 11:49   Mr Lumbar Spine Wo Contrast  05/14/2016  CLINICAL DATA:  Discitis on treatment for 1 month. Increased confusion and fever. Abnormal CT scan. EXAM: MRI LUMBAR SPINE WITHOUT CONTRAST TECHNIQUE: Multiplanar, multisequence MR imaging of the lumbar spine was performed. No intravenous contrast was administered. COMPARISON:  CT of the lumbar spine 05/14/2016. MRI of the lumbar spine 04/05/2016. FINDINGS: Segmentation: 5 non rib-bearing lumbar type vertebral bodies are present. Alignment: AP alignment is anatomic. Leftward curvature of the lumbar spine is centered at L4-5. Vertebrae: A superior endplate fracture at T12 is again seen, incompletely healed. Diffuse edematous changes are present at L4-5. Conus medullaris: Extends to the T12-L1 level and appears normal. Paraspinal and other soft tissues: Edematous paraspinous changes are present at L4-5 with extension into the psoas musculature. There is no discrete abscess. A 10 mm cyst is present at the lower pole of the left kidney. No other focal solid organ lesions are present. Disc levels: The disc levels at L3-4 above are normal. L3-4: A broad-based disc protrusion is present. Moderate facet hypertrophy is noted. This results in mild central and bilateral foraminal stenosis. L4-5: There is further destruction of endplates.  Abnormal signal  is present within the disc space and extending into the epidural space on the left. Disc fragment or epidural abscess extends superiorly along the posterior margin of the left L4 vertebral body 2.5 cm from the inferior endplate of L4. Moderate left subarticular stenosis is present. Moderate left and mild right foraminal narrowing is present. L5-S1: Moderate facet hypertrophy is present bilaterally. There is mild subarticular narrowing on both sides. IMPRESSION: 1. 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 2. Infectious or inflammatory material extends posteriorly from the disc space into the epidural space with superior extension 2.5 cm from the inferior endplate of L4 within the left lateral recess. 3. Contrast could not be given due to renal insufficiency and a GFR of 28. 4. Mild central and bilateral foraminal stenosis at L3-4. 5. Moderate left and mild right foraminal stenosis at L4-5. 6. Mild subarticular narrowing bilaterally at L5-S1 secondary to moderate facet hypertrophy and spurring. Electronically Signed   By: Marin Roberts M.D.   On: 05/14/2016 21:40   Dg Esophagus  05/19/2016  CLINICAL DATA:  75 year old male with dysphagia thought related to the esophagus. Regurgitation of solids and liquids. Initial encounter. EXAM: ESOPHOGRAM/BARIUM SWALLOW TECHNIQUE: Single contrast examination was performed using  thin barium. FLUOROSCOPY TIME:  Radiation Exposure Index (as provided by the fluoroscopic device): If the device does not provide the exposure index: Fluoroscopy Time:  2 minutes 6 seconds COMPARISON:  05/16/2016 FINDINGS: The patient was more cooperative today, and a better quality exam resulted. However, the still took fairly small swallows of barium. 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 No obstruction to the forward flow of contrast throughout the esophagus and into the stomach.  Normal esophageal course and contour. The gastroesophageal junction appear normal. A 12.5 mm barium tablet was administered and passed rapidly to the distal esophagus. It state in the distal esophagus just proximal to the gastroesophageal junction for several minutes (series 17, image 1), but following additional sips of water and after another few minutes it had passed into the stomach. IMPRESSION: Presbyesophagus. No fixed areas of esophageal narrowing or stenosis. No hiatal hernia. Passage of a 12.5 mm barium tablet through the distal esophagus was mildly delayed. Electronically Signed   By: Odessa Fleming M.D.   On: 05/19/2016 09:52   Dg Esophagus  05/16/2016  CLINICAL DATA:  Difficulty swallowing. EXAM: ESOPHOGRAM/BARIUM SWALLOW TECHNIQUE: Single contrast examination was performed using  thin barium. FLUOROSCOPY TIME:  Radiation Exposure Index (as provided by the fluoroscopic device): If the device does not provide the exposure index: Fluoroscopy Time:  1 minutes and 36 seconds Number of Acquired Images: COMPARISON:  None. FINDINGS: Patient had difficulty swallowing thin barium. Piecemeal swallowing reveals no gross esophageal stricture, diverticulum, or mass-effect. Patient was given a 13 mm barium tablet, but was unable to swallow without chewing tablet first. IMPRESSION: Very limited exam secondary to piecemeal swallowing and inability to swallow a tablet whole. No gross esophageal abnormality evident. Consider repeat evaluation after patient recovers from acute clinical picture. Electronically Signed   By: Kennith Center M.D.   On: 05/16/2016 16:38   Ir Fluoro Guide Ndl Plmt / Bx  05/16/2016  INDICATION: 75 year old male with a history of L4-L5 discitis/osteomyelitis. He has been referred for an aspiration for culture. EXAM: IR FLUORO GUIDE NEEDLE PLACEMENT /BIOPSY MEDICATIONS: None. ANESTHESIA/SEDATION: Moderate (conscious) sedation was employed during this procedure. A total of Versed 1.0 mg and Fentanyl 50  mcg  was administered intravenously. Moderate Sedation Time: 20 minutes. The patient's level of consciousness and vital signs were monitored continuously by radiology nursing throughout the procedure under my direct supervision. FLUOROSCOPY TIME:  Fluoroscopy Time: 1 minutes 48 seconds (6 mGy). COMPLICATIONS: None PROCEDURE: Informed written consent was obtained from the patient after a thorough discussion of the procedural risks, benefits and alternatives. All questions were addressed. Maximal Sterile Barrier Technique was utilized including caps, mask, sterile gowns, sterile gloves, sterile drape, hand hygiene and skin antiseptic. A timeout was performed prior to the initiation of the procedure. Patient is position prone position on the fluoroscopy table. The lumbar region was prepped and draped in the usual sterile fashion. Once an oblique angle approach from the left posterior oblique was determined, the skin and subcutaneous tissues were generously infiltrated with 1% lidocaine for local anesthesia. A 20 cm 21 gauge Franceen needle was advanced under fluoroscopy into the disc space at L4-L5. Orthogonal images were acquired. Aspirate was achieved. Patient tolerated the procedure well and remained hemodynamically stable throughout. No complications were encountered and no significant blood loss encountered. IMPRESSION: Status post fluoroscopic guided disc aspirate at the L4-L5 level. Sample sent the lab for analysis. Signed, Yvone Neu. Loreta Ave, DO [facility] Radiology Electronically Signed   By: Gilmer Mor D.O.   On: 05/16/2016 13:34   Dg Chest Port 1 View  05/19/2016  CLINICAL DATA:  Status post dialysis catheter placement EXAM: PORTABLE CHEST 1 VIEW COMPARISON:  05/09/2016 FINDINGS: There is a right IJ catheter with tip in the projection of the right atrium. The heart size is normal. Bilateral pleural effusions and moderate interstitial edema identified  compatible with fluid overload. IMPRESSION: Moderate fluid overload. The right IJ catheter tip is in the right atrium. No complications after catheter insertion Electronically Signed   By: Signa Kell M.D.   On: 05/19/2016 15:00    Time Spent in minutes  25   Eddie North M.D on 05/20/2016 at 3:05 PM  Between 7am to 7pm - Pager - [phone]  After 7pm go to [email or web address] - password [facility]  [facility] -  Office  [phone]

## 2016-05-20 NOTE — Evaluation (Signed)
Physical Therapy Evaluation Patient Details Name: Jeffrey Walls MRN: [id number] DOB: 09-19-1941 Today's Date: 05/20/2016   History of Present Illness  Patient is a 75 y/o male with hx of DM, HTN, ESRD on HD, CAD and renal disorder presents with low back pain and acute encephalopathy. MRI spine-progressive discitis at the L4-5 with destruction of endplates and diffuse marrow edema. s/p dialysis catheter placed in IJ 6/5.  Clinical Impression  Patient presents with generalized weakness, balance deficits, dyspnea on exertion and decreased endurance s/p above impacting mobility. Tolerated transfers and gait training with Min A for balance/safety. Pt lives alone and was independent PTA. Pt not safe to return home at this time. Would benefit from ST SNF to maximize independence and mobility prior to return home. Will follow acutely.     Follow Up Recommendations SNF    Equipment Recommendations  None recommended by PT    Recommendations for Other Services OT consult     Precautions / Restrictions Precautions Precautions: Fall Restrictions Weight Bearing Restrictions: No      Mobility  Bed Mobility Overal bed mobility: Needs Assistance Bed Mobility: Sit to Supine;Supine to Sit     Supine to sit: Supervision;HOB elevated Sit to supine: Supervision;HOB elevated   General bed mobility comments: Use of rails. Increased time.   Transfers Overall transfer level: Needs assistance Equipment used: Rolling walker (2 wheeled) Transfers: Sit to/from [employer]GI CorporationStand;Stand Pivot Transfers Sit to Stand: Min assist Stand pivot transfers: Min assist       General transfer comment: Min A to stand from low bed height, [facility] from chair x2. SPT BSC to chair with min A. Transferred to chair post ambulation bout.  Ambulation/Gait Ambulation/Gait assistance: Min assist Ambulation Distance (Feet):  (x2 bouts) Assistive device: Rolling walker (2 wheeled) Gait Pattern/deviations: Step-through  pattern;Decreased stride length;Trunk flexed Gait velocity: decreased   General Gait Details: Unsteady gait with bil knee instability noted. Min A for balance. 1 seated rest break due to fatigue. 2/4 DOE. Sp02 92% on RA.  Stairs            Wheelchair Mobility    Modified Rankin (Stroke Patients Only)       Balance Overall balance assessment: Needs assistance Sitting-balance support: Feet supported;No upper extremity supported Sitting balance-Leahy Scale: Good     Standing balance support: During functional activity Standing balance-Leahy Scale: Poor Standing balance comment: Reliant on BUEs for support.                              Pertinent Vitals/Pain Pain Assessment: Faces Faces Pain Scale: Hurts even more Pain Location: buttom due to hemorroids Pain Descriptors / Indicators: Sore Pain Intervention(s): Repositioned;Monitored during session    Home Living Family/patient expects to be discharged to:: Private residence Living Arrangements: Alone   Type of Home: House Home Access: Stairs to enter Entrance Stairs-Rails: None Entrance Stairs-Number of Steps: 2 Home Layout: One level Home Equipment: Environmental consultantWalker - 2 wheels;Walker - 4 wheels;Cane - quad      Prior Function Level of Independence: Independent with assistive device(s)         Comments: Uses cane for ambulation.  Drives self to HD. uses w/c as welll.     Hand Dominance   Dominant Hand: Right    Extremity/Trunk Assessment   Upper Extremity Assessment: Defer to OT evaluation           Lower Extremity Assessment: Generalized weakness  Communication   Communication: HOH  Cognition Arousal/Alertness: Awake/alert Behavior During Therapy: WFL for tasks assessed/performed Overall Cognitive Status: Within Functional Limits for tasks assessed                      General Comments General comments (skin integrity, edema, etc.): Pt reporting frustration with not  having a BM and with hemorrhoids. RN notified.     Exercises        Assessment/Plan    PT Assessment Patient needs continued PT services  PT Diagnosis Difficulty walking;Generalized weakness   PT Problem List Decreased strength;Cardiopulmonary status limiting activity;Decreased activity tolerance;Decreased balance;Decreased mobility  PT Treatment Interventions Balance training;Gait training;Functional mobility training;Therapeutic activities;Therapeutic exercise;Patient/family education;Stair training;DME instruction   PT Goals (Current goals can be found in the Care Plan section) Acute Rehab PT Goals Patient Stated Goal: to be able to move around PT Goal Formulation: With patient Time For Goal Achievement: 06/03/16 Potential to Achieve Goals: Good    Frequency Min 3X/week   Barriers to discharge Decreased caregiver support lives alone    Co-evaluation               End of Session Equipment Utilized During Treatment: Gait belt Activity Tolerance: Patient limited by fatigue Patient left: in chair;with call bell/phone within reach;with chair alarm set Nurse Communication: Mobility status         Time: 9563-8756 PT Time Calculation (min) (ACUTE ONLY): 32 min   Charges:   PT Evaluation $PT Eval Moderate Complexity: 1 Procedure PT Treatments $Gait Training: 8-22 mins   PT G Codes:        Yaiden Yang A Jaedan Huttner 05/20/2016, 12:18 PM [name], PT, DPT [phone]

## 2016-05-20 NOTE — Progress Notes (Signed)
05/20/16  1540  Helped patient from the bed to the chair. Patient aware that we need for him to sit up until after he eats dinner per MD order.  Patient agreed to sit in chair. At 1629  Patient got up from chair unassisted and got back in the bed. Patient states he can not sit to long. MD notified that patient only sat up for one hour.

## 2016-05-20 NOTE — Progress Notes (Signed)
  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 aspirin EC  81 mg Oral Daily  .  ceFAZolin (ANCEF) IV  2 g Intravenous Q M,W,F-HD  . citalopram  20 mg Oral Daily  . feeding supplement  1 Container Oral TID BM  . fluconazole  100 mg Oral Daily  . folic acid  1 mg Oral Daily  . insulin aspart  0-9 Units Subcutaneous TID WC  . lisinopril  10 mg Oral Daily  . metoprolol succinate  50 mg Oral Daily  . nystatin  5 mL Oral QID  . pantoprazole  20 mg Oral Daily  . sodium chloride flush  3 mL Intravenous Q12H  . vancomycin  500 mg Intravenous Q M,W,F-HD     ALPRAZolam, lidocaine, oxyCODONE  Exam: Lethargic, arouses and responds approp NO jvd Chest clear bilat RRR soft SEM no RG Abd soft ntnd no ascites No LE edema TDC removed L chest  L FA AVF clean wounds, +bruit Neuro nonfocal  Dialysis: MWF [facility] 56kg 2/2.25 bath Hep none LUA AVF (maturing, placed 03/04/16)/ TDC cath 4 hr      Assessment: 1  Persistent CNS Lumbar Diskitis: on IV Vanc, RCID following 2  Klebs bacteremia - cath sepsis, TDC removed on 6/2 and replaced 6/5, IV cefazolin 3  ESRD HD MWF. AVF is not ready to use yet, placed 3/21.  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) 7  HTN stalb,e  MPT recently inc 8  Vol - need to probef for lower  Plan - HD tomorrow, cont to challenge EDW  [name] Domonic Hiscox MD [facility] 05/20/2016, 11:10 AM    Recent Labs Lab 05/15/16 2021 05/16/16 0945 05/19/16 0625  NA 133* 136 137  K 2.8* 3.0* 3.1*  CL 97* 101 102  CO2 27 29 29   GLUCOSE 87 75 192*  BUN 21* 14 16  CREATININE 2.89* 2.12* 2.74*  CALCIUM 7.7* 7.4* 7.6*  PHOS 3.7 2.7  --     Recent Labs Lab 05/15/16 2021 05/16/16 0945  ALBUMIN 2.0* 1.9*    Recent Labs Lab 05/14/16 1551 05/15/16 2020 05/16/16 0945 05/19/16 0625  WBC 8.1 8.1 5.7 3.8*  NEUTROABS 7.0 6.2  --   --   HGB 10.1* 8.5* 9.0* 9.6*  HCT 31.9* 26.8* 28.7* 31.2*  MCV 88.9 89.3 92.0 90.2  PLT 174 146* 141* 162

## 2016-05-20 NOTE — Anesthesia Preprocedure Evaluation (Signed)
Anesthesia Evaluation  Patient identified by MRN, date of birth, ID band Patient awake    Reviewed: Allergy & Precautions, NPO status , Patient's Chart, lab work & pertinent test results  History of Anesthesia Complications Negative for: history of anesthetic complications  Airway Mallampati: II  TM Distance: >3 FB Neck ROM: Full    Dental no notable dental hx.    Pulmonary shortness of breath,    breath sounds clear to auscultation       Cardiovascular hypertension, Pt. on home beta blockers and Pt. on medications + CAD   Rhythm:Regular     Neuro/Psych negative neurological ROS  negative psych ROS   GI/Hepatic negative GI ROS, Neg liver ROS,   Endo/Other  diabetes  Renal/GU ESRFRenal disease     Musculoskeletal   Abdominal   Peds  Hematology   Anesthesia Other Findings   Reproductive/Obstetrics                             Anesthesia Physical Anesthesia Plan  ASA: III  Anesthesia Plan: MAC   Post-op Pain Management:    Induction: Intravenous  Airway Management Planned: Natural Airway, Nasal Cannula and Simple Face Mask  Additional Equipment: None  Intra-op Plan:   Post-operative Plan:   Informed Consent: I have reviewed the patients History and Physical, chart, labs and discussed the procedure including the risks, benefits and alternatives for the proposed anesthesia with the patient or authorized representative who has indicated his/her understanding and acceptance.   Dental advisory given  Plan Discussed with: CRNA and Surgeon  Anesthesia Plan Comments:         Anesthesia Quick Evaluation

## 2016-05-20 NOTE — Progress Notes (Signed)
Speech Language Pathology Treatment: Dysphagia  Patient Details Name: Jeffrey Walls MRN: [id number] DOB: 09/27/1941 Today's Date: 05/20/2016 Time: 2811-8867 SLP Time Calculation (min) (ACUTE ONLY): 15 min  Assessment / Plan / Recommendation Clinical Impression  Pt seen for follow up after esophagram completed. Results indicate presbyesophagus. SLP reviewed information regarding behavioral and dietary strategies for management of esophageal dysmotility, and posted this information at [facility]. ST to sign off at this time. Please reconsult if needs arise.   HPI HPI: 75 yo male admitted wtih sepsis due to probable discitis and osteomyelitis who also describes difficulty getting POs down. PMH: ESRD, HTN, DM, CAD, SOB, PNA. Esophagram done 6/5 revealed presbyesophagus, no narrowing or stenosis, no hiatal hernia. Passage of 12.30m Barium tablet was mildly delayed.       SLP Plan  Discharge SLP treatment due to (comment);All goals met     Recommendations  Diet recommendations: Regular;Thin liquid Liquids provided via: Cup;Straw Medication Administration: Crushed with puree Supervision: Patient able to self feed Compensations: Slow rate;Small sips/bites;Follow solids with liquid Postural Changes and/or Swallow Maneuvers: Seated upright 90 degrees;Upright 30-60 min after meal   Oral Care Recommendations: Oral care BID Plan: Discharge SLP treatment due to (comment);All goals met         BShonna Chock6/05/2016, 3:39 PM  Celia B. BQuentin OreMLos [facility] [phone]

## 2016-05-20 NOTE — Anesthesia Postprocedure Evaluation (Signed)
Anesthesia Post Note  Patient: Jeffrey Walls  Procedure(s) Performed: Procedure(s) (LRB): INSERTION OF RIGHT INTERNAL JUGULAR TUNNEL DIALYSIS CATHETER (Right)  Patient location during evaluation: PACU Anesthesia Type: MAC Level of consciousness: awake Pain management: pain level controlled Vital Signs Assessment: post-procedure vital signs reviewed and stable Respiratory status: spontaneous breathing Cardiovascular status: stable Postop Assessment: no signs of nausea or vomiting Anesthetic complications: no    Last Vitals:  Filed Vitals:   05/20/16 0929 05/20/16 1402  BP: 152/69 134/72  Pulse: 71 62  Temp:  36.4 C  Resp: 16 18    Last Pain:  Filed Vitals:   05/20/16 1456  PainSc: 5                  Deasiah Hagberg

## 2016-05-21 ENCOUNTER — Encounter (HOSPITAL_COMMUNITY): Payer: Self-pay | Admitting: Vascular Surgery

## 2016-05-21 DIAGNOSIS — N186 End stage renal disease: Secondary | ICD-10-CM

## 2016-05-21 DIAGNOSIS — I10 Essential (primary) hypertension: Secondary | ICD-10-CM

## 2016-05-21 DIAGNOSIS — Z794 Long term (current) use of insulin: Secondary | ICD-10-CM

## 2016-05-21 DIAGNOSIS — E1121 Type 2 diabetes mellitus with diabetic nephropathy: Secondary | ICD-10-CM

## 2016-05-21 LAB — RENAL FUNCTION PANEL
ANION GAP: 10 (ref 5–15)
Albumin: 1.8 g/dL — ABNORMAL LOW (ref 3.5–5.0)
BUN: 12 mg/dL (ref 6–20)
CHLORIDE: 102 mmol/L (ref 101–111)
CO2: 27 mmol/L (ref 22–32)
Calcium: 7.8 mg/dL — ABNORMAL LOW (ref 8.9–10.3)
Creatinine, Ser: 2.5 mg/dL — ABNORMAL HIGH (ref 0.61–1.24)
GFR, EST AFRICAN AMERICAN: 28 mL/min — AB (ref >60)
GFR, EST NON AFRICAN AMERICAN: 24 mL/min — AB (ref >60)
Glucose, Bld: 95 mg/dL (ref 65–99)
POTASSIUM: 3.6 mmol/L (ref 3.5–5.1)
Phosphorus: 2.6 mg/dL (ref 2.5–4.6)
Sodium: 139 mmol/L (ref 135–145)

## 2016-05-21 LAB — AEROBIC/ANAEROBIC CULTURE W GRAM STAIN (SURGICAL/DEEP WOUND)

## 2016-05-21 LAB — CBC
HEMATOCRIT: 29.5 % — AB (ref 39.0–52.0)
HEMOGLOBIN: 9.2 g/dL — AB (ref 13.0–17.0)
MCH: 27.7 pg (ref 26.0–34.0)
MCHC: 31.2 g/dL (ref 30.0–36.0)
MCV: 88.9 fL (ref 78.0–100.0)
Platelets: 176 10*3/uL (ref 150–400)
RBC: 3.32 MIL/uL — ABNORMAL LOW (ref 4.22–5.81)
RDW: 16.1 % — AB (ref 11.5–15.5)
WBC: 4.4 10*3/uL (ref 4.0–10.5)

## 2016-05-21 LAB — AEROBIC/ANAEROBIC CULTURE (SURGICAL/DEEP WOUND)

## 2016-05-21 LAB — GLUCOSE, CAPILLARY
GLUCOSE-CAPILLARY: 96 mg/dL (ref 65–99)
GLUCOSE-CAPILLARY: 119 mg/dL — AB (ref 65–99)
GLUCOSE-CAPILLARY: 157 mg/dL — AB (ref 65–99)

## 2016-05-21 MED ORDER — VANCOMYCIN HCL IN DEXTROSE 500-5 MG/100ML-% IV SOLN
INTRAVENOUS | Status: AC
  Filled 2016-05-21: qty 100

## 2016-05-21 MED ORDER — OXYCODONE HCL 5 MG PO TABS
ORAL_TABLET | ORAL | Status: AC
  Administered 2016-05-21: 5 mg via ORAL
  Filled 2016-05-21: qty 1

## 2016-05-21 NOTE — Progress Notes (Signed)
Pharmacy Antibiotic Note  Jeffrey Walls is a 75 y.o. male admitted on 05/14/2016 with bacteremia and discitis.  Pharmacy has been consulted for Cefazolin and Vancomycin dosing.   Plan: Pt to receive Cefazolin 2g IV qHD through 6/9 for Klebsiella bacteremia.   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  Height: 5\' 6"  (167.6 cm) Weight: 110 lb 7.2 oz (50.1 kg) IBW/kg (Calculated) : 63.8  Temp (24hrs), Avg:97.7 F (36.5 C), Min:97.6 F (36.4 C), Max:97.8 F (36.6 C)   Recent Labs Lab 05/14/16 1551 05/14/16 1825 05/15/16 2020 05/15/16 2021 05/16/16 0945 05/19/16 0625 05/21/16 0713 05/21/16 0714  WBC 8.1  --  8.1  --  5.7 3.8* 4.4  --   CREATININE 2.24*  --   --  2.89* 2.12* 2.74*  --  2.50*  LATICACIDVEN  --  0.80  --   --   --   --   --   --     Estimated Creatinine Clearance: 18.4 mL/min (by C-G formula based on Cr of 2.5).    Allergies  Allergen Reactions  . Ampicillin Rash    Ankle swelling  . Benadryl [Diphenhydramine Hcl] Itching    Rash    Antimicrobials this admission: Vanc (PTA - 4/21 >> 5/31): restarted 6/4 >> (7/16) Ancef 6/2 >> (6/9) Fluconazole po 6/2>>(6/8)  Microbiology results: 5/31 BCID: klebsiella pneumoniae 5/31 BCx: 1/2 klebsiella pneumoniae (R to amp, S all others) 6/1 urine: insignificant growth 6/2 cath tip: ngtd 6/2 disc aspiration: CONS  Thank you for allowing pharmacy to be a part of this patient's care.   Marisue HumbleKendra Trestan Vahle, PharmD Clinical Pharmacist [facility]

## 2016-05-21 NOTE — Progress Notes (Signed)
PT Cancellation Note  Patient Details Name: Jeffrey Walls MRN: [id number] DOB: [birth year]   Cancelled Treatment:    Reason Eval/Treat Not Completed: Patient at procedure or test/unavailable Pt off floor at HD. Will follow up next available time.   Blake DivineShauna A [name] 05/21/2016, 7:28 AM Mylo RedShauna Airyanna Dipalma, PT, DPT [phone]

## 2016-05-21 NOTE — Progress Notes (Signed)
PROGRESS NOTE                                                                                                                                                                                                             Patient Demographics:    Jeffrey Walls, is a 75 y.o. male, DOB - 05-Apr-1941, ZOX:[id number]  Admit date - 05/14/2016   Admitting Physician Costin Otelia Sergeant, MD  Outpatient Primary MD for the patient is Theressa Stamps, MD  LOS - 7  Outpatient Specialists: Renal   Chief Complaint  Patient presents with  . Back Pain       Brief Narrative   75 year old male with history of ESRD on dialysis, combined systolic and diastolic CHF, recent prolonged hospitalization at [facility] for coag-negative staph bacteremia, source felt to be from his dialysis catheter which was replaced and placed on IV vancomycin for 6 weeks course (starting 04/04/2016). 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 Patient presented to the ED with low back pain and acute encephalopathy. Due to his progressive back pain he was unable to complete hemodialysis.. In the ED was septic with fever of 101F and mildly tachycardic. Blood work showed hemoglobin of 10.1, potassium of 2.9 and creatinine of 2.24. Lactic acid was normal. Head CT was unremarkable. CT of the lumbar spine was concerning for discitis and osteomyelitis at the L4-5 level with bony destructive changes and unable to exclude epidural abscess. An MRI of the lumbar spine was done which showed progressive discitis at the L4-5 with destruction of endplates and diffuse marrow edema. Also showed infectious/inflammatory material extending posteriorly into the epidural space from the inferior endplate of L4 within the left lateral recess.  Patient admitted to hospitalist service. Neurosurgery and ID consulted.Blood cultures  on admission growing Klebsiella. Dialysis catheter removed on 6/2 and placed a new one on 6/5. Disc aspirate growing coag negative staph aureus.   Subjective:   Back pain better. No overnight issues, slightly confused this am.    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 Disc aspirate growing coag negative staph aureus. Blood culture 1/2 this admission growing Klebsiella. Started on cefazolin. HD  catheter removed after dialysis on 6/2. New catheter  placed on 6/5. -ID following. Seen by neurosurgeon Dr. Wynetta Emery who recommended to continue with aggressive medical management and given lack of focal neurological deficit does not need surgical intervention at this time. -Sepsis  resolved. -On oxycodone when necessary for pain.  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 Discussed the findings with Dr. Ronelle Nigh where performed TEE earlier. It appeared that this could be vegetation caused by impingement of the prior infected HD catheter on the right atrial free wall. Unfortunately the new HD catheter that was placed in is again in the right atrium so a repeat TEE may not give [place] an exact diagnosis (vegetation versus myxoma). Another option is cardiac MRI with contrast. Discussed with nephrology who recommended against imaging with contrast given high risk of fibrosis. Noncontrast MRI would not have a better yield compared to TEE.  Discussed in detail with Dr. Jearld Pies, nephrology (Dr. Marisue Humble) and ID Dr. Luciana Axe. Plan on treating him with 6 more weeks of IV vancomycin. Hopefully by then his fistula would have matured and we will be able to remove the dialysis catheter and plan on repeat TEE. Stop date for IV vancomycin would be 06/29/2016.   Patient will need to be able to sit up for HD before he can be safely released. Given that  his back pain is much better now, and able to sit o the reclined without difficulty  i think he can tolerate HD.  Today patient was not able to sit the full three hours of HD, . For the next session we will get him to sit the chair and get HD done.       Active Problems: Klebsiella bacteremia ? Due to infected HD catheter. ID recommends Ancef for Klebsiella bacteremia for one week duration. ( stop date 05/25/2016) repeat blood culture sent to ensure clearing pending.   Acute encephalopathy -Secondary to sepsis. MRI brain done negative for acute findings and shows atrophy and old lacunar infarct. Mentation back to normal.  Acute dysphagia Possibly due to oral/esophageal candidiasis. Improved with fluconazole.  ESRD on M, W, F Getting scheduled dialysis here. Renal following . HD catheter replaced .    coagulase-negative staph bacteremia in April 2017 As outlined above. Completed 6 weeks course of IV vancomycin on 05/16/2016. ( unclear if he received vnacomycin regularly with outpt HD due to back pain). Resumed on 6/4 for 6 weeks course further until 06/29/2616    Anemia of renal disease At baseline.    Essential hypertension Stable on home medications.     Diabetes mellitus with diabetic nephropathy, with long-term current use of insulin (HCC) Stable. Monitor on sliding scale coverage.    Protein-calorie malnutrition, severe Nutrition consult.    Hypokalemia/hyponatremia correct with HD      Code Status : full code  Family Communication  : none at bedside.   Disposition Plan  : confirm to  see if pt able to sit on recliner for 3 hrs. Skilled nursing facility on 6/7. (Repeat blood culture sent for clearing)  Barriers For Discharge : ensuring pt able to tolerate outpt HD.   Consults  :   Renal ID Neurosurgery (Dr Wynetta Emery) IR    Procedures  :  CT head  CT lumbar spine  MRI lumbar spine L4-L5 disc aspiration MRI brain Replacement of hemodialysis  catheter  DVT Prophylaxis  :   Sq heparin  Lab Results  Component Value Date  PLT 176 05/21/2016    Antibiotics  :    Anti-infectives    Start     Dose/Rate Route Frequency Ordered Stop   05/21/16 0911  vancomycin (VANCOCIN) 500-5 MG/100ML-% IVPB    Comments:  [place] Shi   : cabinet override      05/21/16 0911 05/21/16 2114   05/19/16 1800  vancomycin (VANCOCIN) 500 mg in sodium chloride 0.9 % 100 mL IVPB     500 mg 100 mL/hr over 60 Minutes Intravenous Every M-W-F (Hemodialysis) 05/19/16 1751     05/19/16 1800  ceFAZolin (ANCEF) IVPB 2g/100 mL premix     2 g 200 mL/hr over 30 Minutes Intravenous Every M-W-F (Hemodialysis) 05/19/16 1752     05/19/16 1746  vancomycin (VANCOCIN) 500-5 MG/100ML-% IVPB    Comments:  Woods, [name]   : cabinet override      05/19/16 1746 05/19/16 1806   05/19/16 1200  vancomycin (VANCOCIN) 500 mg in sodium chloride 0.9 % 100 mL IVPB  Status:  Discontinued     500 mg 100 mL/hr over 60 Minutes Intravenous Every M-W-F (Hemodialysis) 05/18/16 1102 05/19/16 1751   05/19/16 1200  ceFAZolin (ANCEF) IVPB 2g/100 mL premix  Status:  Discontinued     2 g 200 mL/hr over 30 Minutes Intravenous Every M-W-F (Hemodialysis) 05/18/16 1105 05/19/16 1752   05/18/16 1130  vancomycin (VANCOCIN) 1,250 mg in sodium chloride 0.9 % 250 mL IVPB     1,250 mg 166.7 mL/hr over 90 Minutes Intravenous NOW 05/18/16 1055 05/18/16 1352   05/16/16 1815  fluconazole (DIFLUCAN) tablet 100 mg     100 mg Oral Daily 05/16/16 1809 05/23/16 0959   05/16/16 1800  ceFAZolin (ANCEF) IVPB 1 g/50 mL premix     1 g 100 mL/hr over 30 Minutes Intravenous Every 24 hours 05/16/16 1634 05/18/16 2124   05/16/16 1200  vancomycin (VANCOCIN) 500 mg in sodium chloride 0.9 % 100 mL IVPB  Status:  Discontinued     500 mg 100 mL/hr over 60 Minutes Intravenous Every M-W-F (Hemodialysis) 05/14/16 1907 05/15/16 1506   05/14/16 1745  vancomycin (VANCOCIN) IVPB 1000 mg/200 mL premix     1,000 mg 200  mL/hr over 60 Minutes Intravenous  Once 05/14/16 1736 05/14/16 1914        Objective:   Filed Vitals:   05/21/16 1030 05/21/16 1040 05/21/16 1043 05/21/16 1355  BP: 93/60 80/47 126/69 170/74  Pulse: 72 75 67 67  Temp:  97.6 F (36.4 C)  97.8 F (36.6 C)  TempSrc:    Oral  Resp:  17    Height:      Weight:  50.1 kg (110 lb 7.2 oz)    SpO2:    100%    Wt Readings from Last 3 Encounters:  05/21/16 50.1 kg (110 lb 7.2 oz)  04/11/16 51.8 kg (114 lb 3.2 oz)  03/05/16 57.1 kg (125 lb 14.1 oz)     Intake/Output Summary (Last 24 hours) at 05/21/16 1722 Last data filed at 05/21/16 1500  Gross per 24 hour  Intake    340 ml  Output   1300 ml  Net   -960 ml     Physical Exam  Gen: not in distress, HEENT: moist mucosa, supple neck Chest: clear b/l, no added sounds CVS: N S1&S2, no murmurs, rubs or gallop GI: soft, NT, ND, BS+ Musculoskeletal: warm, no edema,  CNS: Alert and oriented, nonfocal    Data Review:    CBC  Recent Labs Lab  05/15/16 2020 05/16/16 0945 05/19/16 0625 05/21/16 0713  WBC 8.1 5.7 3.8* 4.4  HGB 8.5* 9.0* 9.6* 9.2*  HCT 26.8* 28.7* 31.2* 29.5*  PLT 146* 141* 162 176  MCV 89.3 92.0 90.2 88.9  MCH 28.3 28.8 27.7 27.7  MCHC 31.7 31.4 30.8 31.2  RDW 16.6* 16.6* 16.4* 16.1*  LYMPHSABS 1.0  --   --   --   MONOABS 0.9  --   --   --   EOSABS 0.0  --   --   --   BASOSABS 0.0  --   --   --     Chemistries   Recent Labs Lab 05/15/16 2021 05/16/16 0945 05/19/16 0625 05/21/16 0714  NA 133* 136 137 139  K 2.8* 3.0* 3.1* 3.6  CL 97* 101 102 102  CO2 27 29 29 27   GLUCOSE 87 75 192* 95  BUN 21* 14 16 12   CREATININE 2.89* 2.12* 2.74* 2.50*  CALCIUM 7.7* 7.4* 7.6* 7.8*   ------------------------------------------------------------------------------------------------------------------ No results for input(s): CHOL, HDL, LDLCALC, TRIG, CHOLHDL, LDLDIRECT in the last 72 hours.  Lab Results  Component Value Date   HGBA1C 5.5 03/31/2016    ------------------------------------------------------------------------------------------------------------------ No results for input(s): TSH, T4TOTAL, T3FREE, THYROIDAB in the last 72 hours.  Invalid input(s): FREET3 ------------------------------------------------------------------------------------------------------------------ No results for input(s): VITAMINB12, FOLATE, FERRITIN, TIBC, IRON, RETICCTPCT in the last 72 hours.  Coagulation profile  Recent Labs Lab 05/16/16 0538  INR 1.37    No results for input(s): DDIMER in the last 72 hours.  Cardiac Enzymes No results for input(s): CKMB, TROPONINI, MYOGLOBIN in the last 168 hours.  Invalid input(s): CK ------------------------------------------------------------------------------------------------------------------    Component Value Date/Time   BNP >4500.0* 02/28/2016 2200    Inpatient Medications  Scheduled Meds: . aspirin EC  81 mg Oral Daily  .  ceFAZolin (ANCEF) IV  2 g Intravenous Q M,W,F-HD  . citalopram  20 mg Oral Daily  . feeding supplement  1 Container Oral TID BM  . fluconazole  100 mg Oral Daily  . folic acid  1 mg Oral Daily  . insulin aspart  0-9 Units Subcutaneous TID WC  . lisinopril  10 mg Oral Daily  . metoprolol succinate  50 mg Oral Daily  . nystatin  5 mL Oral QID  . pantoprazole  20 mg Oral Daily  . sodium chloride flush  3 mL Intravenous Q12H  . vancomycin  500 mg Intravenous Q M,W,F-HD  . vancomycin       Continuous Infusions:  PRN Meds:.ALPRAZolam, lidocaine, oxyCODONE  Micro Results Recent Results (from the past 240 hour(s))  Blood culture (routine x 2)     Status: Abnormal   Collection Time: 05/14/16  4:10 PM  Result Value Ref Range Status   Specimen Description BLOOD LEFT ANTECUBITAL  Final   Special Requests BOTTLES DRAWN AEROBIC AND ANAEROBIC 5CC  Final   Culture  Setup Time   Final    GRAM NEGATIVE RODS AEROBIC BOTTLE ONLY CRITICAL RESULT CALLED TO, READ BACK BY AND  VERIFIED WITH: L. Bajbus Pharm.D. 16:20 05/15/16 ([email or web address])    Culture KLEBSIELLA PNEUMONIAE (A)  Final   Report Status 05/17/2016 FINAL  Final   Organism ID, Bacteria KLEBSIELLA PNEUMONIAE  Final      Susceptibility   Klebsiella pneumoniae - MIC*    AMPICILLIN >=32 RESISTANT Resistant     CEFAZOLIN <=4 SENSITIVE Sensitive     CEFEPIME <=1 SENSITIVE Sensitive     CEFTAZIDIME <=1 SENSITIVE Sensitive     CEFTRIAXONE <=  1 SENSITIVE Sensitive     CIPROFLOXACIN <=0.25 SENSITIVE Sensitive     GENTAMICIN <=1 SENSITIVE Sensitive     IMIPENEM <=0.25 SENSITIVE Sensitive     TRIMETH/SULFA <=20 SENSITIVE Sensitive     AMPICILLIN/SULBACTAM 8 SENSITIVE Sensitive     PIP/TAZO <=4 SENSITIVE Sensitive     * KLEBSIELLA PNEUMONIAE  Blood Culture ID Panel (Reflexed)     Status: Abnormal   Collection Time: 05/14/16  4:10 PM  Result Value Ref Range Status   Enterococcus species NOT DETECTED NOT DETECTED Final   Vancomycin resistance NOT DETECTED NOT DETECTED Final   Listeria monocytogenes NOT DETECTED NOT DETECTED Final   Staphylococcus species NOT DETECTED NOT DETECTED Final   Staphylococcus aureus NOT DETECTED NOT DETECTED Final   Methicillin resistance NOT DETECTED NOT DETECTED Final   Streptococcus species NOT DETECTED NOT DETECTED Final   Streptococcus agalactiae NOT DETECTED NOT DETECTED Final   Streptococcus pneumoniae NOT DETECTED NOT DETECTED Final   Streptococcus pyogenes NOT DETECTED NOT DETECTED Final   Acinetobacter baumannii NOT DETECTED NOT DETECTED Final   Enterobacteriaceae species DETECTED (A) NOT DETECTED Final    Comment: CRITICAL RESULT CALLED TO, READ BACK BY AND VERIFIED WITH: L. Bajbus Pharm.D. 16:20 05/15/16 ([email or web address])    Enterobacter cloacae complex NOT DETECTED NOT DETECTED Final   Escherichia coli NOT DETECTED NOT DETECTED Final   Klebsiella oxytoca NOT DETECTED NOT DETECTED Final   Klebsiella pneumoniae DETECTED (A) NOT DETECTED Final    Comment: CRITICAL RESULT CALLED TO,  READ BACK BY AND VERIFIED WITH: L. Bajbus Pharm.D. 16:20 05/15/16 ([email or web address])    Proteus species NOT DETECTED NOT DETECTED Final   Serratia marcescens NOT DETECTED NOT DETECTED Final   Carbapenem resistance NOT DETECTED NOT DETECTED Final   Haemophilus influenzae NOT DETECTED NOT DETECTED Final   Neisseria meningitidis NOT DETECTED NOT DETECTED Final   Pseudomonas aeruginosa NOT DETECTED NOT DETECTED Final   Candida albicans NOT DETECTED NOT DETECTED Final   Candida glabrata NOT DETECTED NOT DETECTED Final   Candida krusei NOT DETECTED NOT DETECTED Final   Candida parapsilosis NOT DETECTED NOT DETECTED Final   Candida tropicalis NOT DETECTED NOT DETECTED Final  Blood culture (routine x 2)     Status: None   Collection Time: 05/14/16  6:00 PM  Result Value Ref Range Status   Specimen Description BLOOD RIGHT HAND  Final   Special Requests BOTTLES DRAWN AEROBIC AND ANAEROBIC 10CC  Final   Culture NO GROWTH 5 DAYS  Final   Report Status 05/19/2016 FINAL  Final  Urine culture     Status: Abnormal   Collection Time: 05/15/16  4:39 AM  Result Value Ref Range Status   Specimen Description URINE, RANDOM  Final   Special Requests NONE  Final   Culture <10,000 COLONIES/mL INSIGNIFICANT GROWTH (A)  Final   Report Status 05/16/2016 FINAL  Final  Aerobic / Anaerobic Culture     Status: None   Collection Time: 05/16/16 10:38 AM  Result Value Ref Range Status   Specimen Description ABSCESS  Final   Special Requests L4 TO L5 DISCITIS  Final   Gram Stain   Final    FEW WBC PRESENT, PREDOMINANTLY PMN RARE GRAM POSITIVE COCCI IN PAIRS    Culture   Final    FEW STAPHYLOCOCCUS SPECIES (COAGULASE NEGATIVE) NO ANAEROBES ISOLATED    Report Status 05/21/2016 FINAL  Final   Organism ID, Bacteria STAPHYLOCOCCUS SPECIES (COAGULASE NEGATIVE)  Final      Susceptibility   Staphylococcus  species (coagulase negative) - MIC*    CIPROFLOXACIN >=8 RESISTANT Resistant     ERYTHROMYCIN >=8 RESISTANT Resistant      GENTAMICIN <=0.5 SENSITIVE Sensitive     OXACILLIN >=4 RESISTANT Resistant     TETRACYCLINE 2 SENSITIVE Sensitive     VANCOMYCIN 1 SENSITIVE Sensitive     TRIMETH/SULFA 160 RESISTANT Resistant     CLINDAMYCIN >=8 RESISTANT Resistant     RIFAMPIN <=0.5 SENSITIVE Sensitive     Inducible Clindamycin NEGATIVE Sensitive     * FEW STAPHYLOCOCCUS SPECIES (COAGULASE NEGATIVE)  Cath Tip Culture     Status: None   Collection Time: 05/16/16  2:51 PM  Result Value Ref Range Status   Specimen Description CATH TIP  Final   Special Requests NONE  Final   Culture NO GROWTH 3 DAYS  Final   Report Status 05/19/2016 FINAL  Final  Surgical pcr screen     Status: None   Collection Time: 05/19/16  2:06 AM  Result Value Ref Range Status   MRSA, PCR NEGATIVE NEGATIVE Final   Staphylococcus aureus NEGATIVE NEGATIVE Final    Comment:        The Xpert SA Assay (FDA approved for NASAL specimens in patients over 47 years of age), is one component of a comprehensive surveillance program.  Test performance has been validated by [facility] for patients greater than or equal to 1 year old. It is not intended to diagnose infection nor to guide or monitor treatment.   Culture, blood (routine x 2)     Status: None (Preliminary result)   Collection Time: 05/19/16  8:46 PM  Result Value Ref Range Status   Specimen Description BLOOD RIGHT ANTECUBITAL  Final   Special Requests BOTTLES DRAWN AEROBIC AND ANAEROBIC 5CC  Final   Culture NO GROWTH 2 DAYS  Final   Report Status PENDING  Incomplete  Culture, blood (routine x 2)     Status: None (Preliminary result)   Collection Time: 05/19/16  8:51 PM  Result Value Ref Range Status   Specimen Description BLOOD RIGHT ANTECUBITAL  Final   Special Requests IN PEDIATRIC BOTTLE 1CC  Final   Culture NO GROWTH 2 DAYS  Final   Report Status PENDING  Incomplete    Radiology Reports Ct Head Wo Contrast  05/14/2016  CLINICAL DATA:  Fall today. Altered mental status.  Agitation/confusion. 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 EXAM: CT HEAD WITHOUT CONTRAST TECHNIQUE: Contiguous axial images were obtained from the base of the skull through the vertex without intravenous contrast. COMPARISON:  05/09/2016 FINDINGS: Small remote lacunar infarcts in the left lentiform nucleus. Otherwise, the brainstem, cerebellum, cerebral peduncles, thalami, basal ganglia, basilar cisterns, and ventricular system appear within normal limits. Periventricular white matter and corona radiata hypodensities favor chronic ischemic microvascular white matter disease. No intracranial hemorrhage, mass lesion, or acute CVA. IMPRESSION: 1. No acute intracranial findings. 2. Periventricular white matter and corona radiata hypodensities favor chronic ischemic microvascular white matter disease. 3. 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 Electronically Signed   By: Gaylyn Rong M.D.   On: 05/14/2016 16:51   Ct Lumbar Spine Wo Contrast  05/14/2016  CLINICAL DATA:  Fall on Tuesday. Pain in the back. This is worsened. EXAM: CT LUMBAR SPINE WITHOUT CONTRAST TECHNIQUE: Multidetector CT imaging of the lumbar spine was performed without intravenous contrast administration. Multiplanar CT image reconstructions were also generated. COMPARISON:  05/09/2016 FINDINGS: Acute 15% superior endplate compression fracture at T12 with endplate sclerosis and some  cortical irregularity. No significant bony retropulsion. This is new compared to the MRI from 04/05/2016. Abnormal erosive findings along the inferior endplate of L4 and superior endplate of L5. These lack the characteristic sclerotic margin for Schmorl's nodes, and moreover there is some sclerosis and interval flattening along the anterior superior endplate of L5 compared to the 04/05/2016 exam. These heighten my concern that the findings at L4-5 are probably due to discitis -osteomyelitis. There is also prominence of the paraspinal tissues at  this level deep to the psoas muscles, probably inflammatory. There is also a suggestion of degenerative disc disease at this level potentially causing foraminal impingement. Strictly speaking I cannot exclude epidural abscess given the slight epidural prominence in this vicinity and concern for discitis. Along the inferior endplate of L3 there is a more typical appearing Schmorl' s node, with sclerotic margin. No vertebral subluxation is observed. The is a right twelfth rib fracture posteriorly on image 39/2 L1. Bridging spurring of the left sacroiliac joint. Aortoiliac atherosclerotic vascular disease. Bilateral pleural effusions. Bilateral perirenal stranding. Diffuse subcutaneous and mesenteric edema. IMPRESSION: 1. 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 2. There is also a new 15% superior endplate compression fracture T12 as well as a nondisplaced fracture of the right T12 rib posteriorly. 3. Bilateral pleural effusions are at least moderate in size but only partially characterized on today' s exam. 4. Perirenal, subcutaneous, and mesenteric edema. Electronically Signed   By: Gaylyn RongWalter  Liebkemann M.D.   On: 05/14/2016 17:10   Mr Brain Wo Contrast  05/17/2016  CLINICAL DATA:  Sepsis. Discitis and osteomyelitis. Infected dialysis catheter. Acute encephalopathy. New onset dysphagia. EXAM: MRI HEAD WITHOUT CONTRAST TECHNIQUE: Multiplanar, multiecho pulse sequences of the brain and surrounding structures were obtained without intravenous contrast. COMPARISON:  CT head without contrast 05/14/2016. FINDINGS: Moderate generalized atrophy and diffuse white matter disease is present. A remote lacunar infarct is present at the right thalamus. Remote asymmetric white matter  ischemia is present in the anterior left frontal lobe. No acute infarct, hemorrhage, or mass lesion is present. The ventricles are proportionate to the degree of atrophy. No significant extra-axial fluid collection is present. The internal auditory canals are normal bilaterally. Flow is present in the major intracranial arteries. Globes and orbits are intact. The paranasal sinuses are clear. There is some fluid in the posterior mastoid air cells bilaterally. No obstructing nasopharyngeal lesion is present. The skullbase is within normal limits. Intracranial midline sagittal images are unremarkable. Grade 1 anterolisthesis is present at C2-3. A broad-based disc osteophyte complex is present at C3-4. IMPRESSION: 1. Moderate generalized atrophy and diffuse white matter disease without acute intracranial abnormality. 2. Remote lacunar infarct of the right thalamus. 3. Small bilateral posterior mastoid effusions. No obstructing nasopharyngeal lesion is present. 4. Spondylosis of the upper cervical spine as described. Electronically Signed   By: Marin Robertshristopher  Mattern M.D.   On: 05/17/2016 11:49   Mr Lumbar Spine Wo Contrast  05/14/2016  CLINICAL DATA:  Discitis on treatment for 1 month. Increased confusion and fever. Abnormal CT scan. EXAM: MRI LUMBAR SPINE WITHOUT CONTRAST TECHNIQUE: Multiplanar, multisequence MR imaging of the lumbar spine was performed. No intravenous contrast was administered. COMPARISON:  CT of the lumbar spine 05/14/2016. MRI of the lumbar spine 04/05/2016. FINDINGS: Segmentation: 5 non rib-bearing lumbar type vertebral bodies are  present. Alignment: AP alignment is anatomic. Leftward curvature of the lumbar spine is centered at L4-5. Vertebrae: A superior endplate fracture at T12 is again seen, incompletely healed. Diffuse edematous changes are present at L4-5. Conus medullaris: Extends to the T12-L1 level and appears normal. Paraspinal and other soft tissues: Edematous paraspinous changes are  present at L4-5 with extension into the psoas musculature. There is no discrete abscess. A 10 mm cyst is present at the lower pole of the left kidney. No other focal solid organ lesions are present. Disc levels: The disc levels at L3-4 above are normal. L3-4: A broad-based disc protrusion is present. Moderate facet hypertrophy is noted. This results in mild central and bilateral foraminal stenosis. L4-5: There is further destruction of endplates. Abnormal signal is present within the disc space and extending into the epidural space on the left. Disc fragment or epidural abscess extends superiorly along the posterior margin of the left L4 vertebral body 2.5 cm from the inferior endplate of L4. Moderate left subarticular stenosis is present. Moderate left and mild right foraminal narrowing is present. L5-S1: Moderate facet hypertrophy is present bilaterally. There is mild subarticular narrowing on both sides. IMPRESSION: 1. 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 2. Infectious or inflammatory material extends posteriorly from the disc space into the epidural space with superior extension 2.5 cm from the inferior endplate of L4 within the left lateral recess. 3. Contrast could not be given due to renal insufficiency and a GFR of 28. 4. Mild central and bilateral foraminal stenosis at L3-4. 5. Moderate left and mild right foraminal stenosis at L4-5. 6. Mild subarticular narrowing bilaterally at L5-S1 secondary to moderate facet hypertrophy and spurring. Electronically Signed   By: Marin Roberts M.D.   On: 05/14/2016 21:40   Dg Esophagus  05/19/2016  CLINICAL DATA:  75 year old male with dysphagia thought related to the esophagus. Regurgitation of solids and liquids. Initial encounter. EXAM: ESOPHOGRAM/BARIUM SWALLOW TECHNIQUE: Single contrast examination was performed using  thin barium. FLUOROSCOPY TIME:  Radiation Exposure Index (as provided by the  fluoroscopic device): If the device does not provide the exposure index: Fluoroscopy Time:  2 minutes 6 seconds COMPARISON:  05/16/2016 FINDINGS: The patient was more cooperative today, and a better quality exam resulted. However, the still took fairly small swallows of barium. 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 No obstruction to the forward flow of contrast throughout the esophagus and into the stomach. Normal esophageal course and contour. The gastroesophageal junction appear normal. A 12.5 mm barium tablet was administered and passed rapidly to the distal esophagus. It state in the distal esophagus just proximal to the gastroesophageal junction for several minutes (series 17, image 1), but following additional sips of water and after another few minutes it had passed into the stomach. IMPRESSION: Presbyesophagus. No fixed areas of esophageal narrowing or stenosis. No hiatal hernia. Passage of a 12.5 mm barium tablet through the distal esophagus was mildly delayed. Electronically Signed   By: Odessa Fleming M.D.   On: 05/19/2016 09:52   Dg Esophagus  05/16/2016  CLINICAL DATA:  Difficulty swallowing. EXAM: ESOPHOGRAM/BARIUM SWALLOW TECHNIQUE: Single contrast examination was performed using  thin barium. FLUOROSCOPY TIME:  Radiation Exposure Index (as provided by the fluoroscopic device): If the device does not provide the exposure index: Fluoroscopy Time:  1 minutes and 36 seconds Number of Acquired Images: COMPARISON:  None. FINDINGS: Patient had difficulty swallowing thin barium. Piecemeal swallowing  reveals no gross esophageal stricture, diverticulum, or mass-effect. Patient was given a 13 mm barium tablet, but was unable to swallow without chewing tablet first. IMPRESSION: Very limited exam secondary to piecemeal swallowing and inability to swallow a tablet whole. No gross esophageal abnormality evident. Consider repeat evaluation after patient recovers  from acute clinical picture. Electronically Signed   By: Kennith Center M.D.   On: 05/16/2016 16:38   Ir Fluoro Guide Ndl Plmt / Bx  05/16/2016  INDICATION: 75 year old male with a history of L4-L5 discitis/osteomyelitis. He has been referred for an aspiration for culture. EXAM: IR FLUORO GUIDE NEEDLE PLACEMENT /BIOPSY MEDICATIONS: None. ANESTHESIA/SEDATION: Moderate (conscious) sedation was employed during this procedure. A total of Versed 1.0 mg and Fentanyl 50 mcg was administered intravenously. Moderate Sedation Time: 20 minutes. The patient's level of consciousness and vital signs were monitored continuously by radiology nursing throughout the procedure under my direct supervision. FLUOROSCOPY TIME:  Fluoroscopy Time: 1 minutes 48 seconds (6 mGy). COMPLICATIONS: None PROCEDURE: Informed written consent was obtained from the patient after a thorough discussion of the procedural risks, benefits and alternatives. All questions were addressed. Maximal Sterile Barrier Technique was utilized including caps, mask, sterile gowns, sterile gloves, sterile drape, hand hygiene and skin antiseptic. A timeout was performed prior to the initiation of the procedure. Patient is position prone position on the fluoroscopy table. The lumbar region was prepped and draped in the usual sterile fashion. Once an oblique angle approach from the left posterior oblique was determined, the skin and subcutaneous tissues were generously infiltrated with 1% lidocaine for local anesthesia. A 20 cm 21 gauge Franceen needle was advanced under fluoroscopy into the disc space at L4-L5. Orthogonal images were acquired. Aspirate was achieved. Patient tolerated the procedure well and remained hemodynamically stable throughout. No complications were encountered and no significant blood loss encountered. IMPRESSION: Status post fluoroscopic guided disc aspirate at the L4-L5 level. Sample sent the lab for analysis. Signed, Yvone Neu. Loreta Ave, DO [facility] Electronically Signed   By: Gilmer Mor D.O.   On: 05/16/2016 13:34   Dg Chest Port 1 View  05/19/2016  CLINICAL DATA:  Status post dialysis catheter placement EXAM: PORTABLE CHEST 1 VIEW COMPARISON:  05/09/2016 FINDINGS: There is a right IJ catheter with tip in the projection of the right atrium. The heart size is normal. Bilateral pleural effusions and moderate interstitial edema identified compatible with fluid overload. IMPRESSION: Moderate fluid overload. The right IJ catheter tip is in the right atrium. No complications after catheter insertion Electronically Signed   By: Signa Kell M.D.   On: 05/19/2016 15:00    Time Spent in minutes  25   Hikeem Andersson M.D on 05/21/2016 at 5:22 PM  Between 7am to 7pm - Pager - [phone]  After 7pm go to [email or web address] - password [facility]  [facility] -  Office  [phone]

## 2016-05-21 NOTE — Progress Notes (Signed)
Nutrition Follow-up  DOCUMENTATION CODES:   Severe malnutrition in context of chronic illness  INTERVENTION:   -Continue Boost Breeze po TID, each supplement provides 250 kcal and 9 grams of protein  NUTRITION DIAGNOSIS:   Malnutrition related to chronic illness as evidenced by severe depletion of body fat, severe depletion of muscle mass.  Ongoing  GOAL:   Patient will meet greater than or equal to 90% of their needs  Progressing  MONITOR:   PO intake, Supplement acceptance, Diet advancement, Labs, Weight trends, Skin, I & O's  REASON FOR ASSESSMENT:   Malnutrition Screening Tool    ASSESSMENT:   Jeffrey Walls is a 75 y.o. male with medical history significant of ESRD, HTN, Combined CHF, [facility] for coag negative staph bacteremia, felt to be related to his hemodialysis catheter, which was apparently replaced, and patient was placed on IV vancomycin for 6 weeks, supposed to finish the treatment in couple of days. 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  Tunnelled HD cath was removed on 05/16/16, due to positive blood cultures. New HD cath placed on 05/19/16.   Pt underwent esophagram on 05/20/16; per SLP notes, results indicate presbyesophagus. Pt continues on a renal/ carb modified diet. He denies any difficulty chewing or swallowing foods.   Pt reports continued fair appetite. Meal completion 10-50%. Pt reports he really enjoys the Boost Breeze supplements and is consuming them. He refuses Nepro shakes.   CSW following. Per therapy notes, recommendation is for SNF.   Labs reviewed: CBGS: 75-158.   Diet Order:  Diet renal/carb modified with fluid restriction Diet-HS Snack?: Nothing; Room service appropriate?: Yes; Fluid consistency:: Thin  Skin:  Reviewed, no issues  Last BM:  05/20/16  Height:   Ht Readings from Last 1  Encounters:  05/14/16 5\' 6"  (1.676 m)    Weight:   Wt Readings from Last 1 Encounters:  05/21/16 110 lb 7.2 oz (50.1 kg)    Ideal Body Weight:  64.5 kg  BMI:  Body mass index is 17.84 kg/(m^2).  Estimated Nutritional Needs:   Kcal:  1700-1900  Protein:  80-95 grams  Fluid:  per MD  EDUCATION NEEDS:   No education needs identified at this time  Zalyn Amend A. Mayford KnifeWilliams, RD, LDN, CDE Pager: [phone] After hours Pager: [phone]

## 2016-05-21 NOTE — Progress Notes (Signed)
Physical Therapy Treatment Patient Details Name: Jeffrey Walls MRN: [id number] DOB: 30-Jul-1941 Today's Date: 05/21/2016    History of Present Illness Patient is a 75 y/o male with hx of DM, HTN, ESRD on HD, CAD and renal disorder presents with low back pain and acute encephalopathy. MRI spine-progressive discitis at the L4-5 with destruction of endplates and diffuse marrow edema. s/p dialysis catheter placed in IJ 6/5.    PT Comments    Patient continues to be frustrated with being in the hospital. Agreeable to OOB. Tolerated short distance ambulation with Min A for balance/safety. Requires chair follow due to fatigue. Distance limited as pt wanting to eat lunch. Encouraged OOB to chair as much as tolerated. Will follow.   Follow Up Recommendations  SNF     Equipment Recommendations  None recommended by PT    Recommendations for Other Services       Precautions / Restrictions Precautions Precautions: Fall Restrictions Weight Bearing Restrictions: No    Mobility  Bed Mobility Overal bed mobility: Needs Assistance Bed Mobility: Supine to Sit     Supine to sit: Supervision     General bed mobility comments: HOB flat, use of rails. Increased time.  Transfers Overall transfer level: Needs assistance Equipment used: Rolling walker (2 wheeled) Transfers: Sit to/from Stand Sit to Stand: Min assist         General transfer comment: Min A to stand from low bed height.  Transferred to chair post ambulation bout.  Ambulation/Gait Ambulation/Gait assistance: Min assist Ambulation Distance (Feet): 90 Feet Assistive device: Rolling walker (2 wheeled) Gait Pattern/deviations: Step-through pattern;Decreased stride length;Trunk flexed Gait velocity: decreased   General Gait Details: Knee stability improved today but weakness and fatigue still noted. 2/4 DOE. Distance limited as pt wanted to eat lunch.   Stairs            Wheelchair Mobility    Modified Rankin  (Stroke Patients Only)       Balance Overall balance assessment: Needs assistance Sitting-balance support: Feet supported;No upper extremity supported Sitting balance-Leahy Scale: Good Sitting balance - Comments: Declined donning socks, "I don't do that."   Standing balance support: During functional activity Standing balance-Leahy Scale: Poor Standing balance comment: Reliant on BUEs for support.                    Cognition Arousal/Alertness: Awake/alert Behavior During Therapy: WFL for tasks assessed/performed Overall Cognitive Status: No family/caregiver present to determine baseline cognitive functioning (Does not seem to have good insight into situation, impaired memory. Impaired safety awareness.)                      Exercises      General Comments General comments (skin integrity, edema, etc.): pt frustrated about being in the hospital. "I don't know whats going on, do you know what's going on?" "they are dropping the ball around here."      Pertinent Vitals/Pain Pain Assessment: Faces Faces Pain Scale: Hurts little more Pain Location: dialysis catheter site Pain Descriptors / Indicators: Sore;Heaviness Pain Intervention(s): Monitored during session    Home Living                      Prior Function            PT Goals (current goals can now be found in the care plan section) Progress towards PT goals: Progressing toward goals (slowly)    Frequency  Min 3X/week  PT Plan Current plan remains appropriate    Co-evaluation             End of Session Equipment Utilized During Treatment: Gait belt Activity Tolerance: Patient limited by fatigue Patient left: in chair;with call bell/phone within reach;with chair alarm set     Time: 1610-9604 PT Time Calculation (min) (ACUTE ONLY): 19 min  Charges:  $Gait Training: 8-22 mins                    G Codes:      Jeffrey Walls 05/21/2016, 2:39 PM [name], PT,  DPT [phone]

## 2016-05-21 NOTE — Procedures (Signed)
I was present at this dialysis session. I have reviewed the session itself and made appropriate changes.   Pt unwilling to sit in chair for HD today to verify he can complete full treatment and rec outpt ABX.  I explained rationale to patient who expressed understanding.  He has limited insight. Will complete remainder of Tx in chair.  If tolerates I think can safely return to outpatient HD.     Recent Labs Lab 05/21/16 0714  NA 139  K 3.6  CL 102  CO2 27  GLUCOSE 95  BUN 12  CREATININE 2.50*  CALCIUM 7.8*  PHOS 2.6     Recent Labs Lab 05/14/16 1551 05/15/16 2020 05/16/16 0945 05/19/16 0625 05/21/16 0713  WBC 8.1 8.1 5.7 3.8* 4.4  NEUTROABS 7.0 6.2  --   --   --   HGB 10.1* 8.5* 9.0* 9.6* 9.2*  HCT 31.9* 26.8* 28.7* 31.2* 29.5*  MCV 88.9 89.3 92.0 90.2 88.9  PLT 174 146* 141* 162 176    Scheduled Meds: . aspirin EC  81 mg Oral Daily  .  ceFAZolin (ANCEF) IV  2 g Intravenous Q M,W,F-HD  . citalopram  20 mg Oral Daily  . feeding supplement  1 Container Oral TID BM  . fluconazole  100 mg Oral Daily  . folic acid  1 mg Oral Daily  . insulin aspart  0-9 Units Subcutaneous TID WC  . lisinopril  10 mg Oral Daily  . metoprolol succinate  50 mg Oral Daily  . nystatin  5 mL Oral QID  . pantoprazole  20 mg Oral Daily  . sodium chloride flush  3 mL Intravenous Q12H  . vancomycin  500 mg Intravenous Q M,W,F-HD   Continuous Infusions:  PRN Meds:.ALPRAZolam, lidocaine, oxyCODONE    [name] Gwen Edler  MD 05/21/2016, 8:08 AM

## 2016-05-22 LAB — GLUCOSE, CAPILLARY
GLUCOSE-CAPILLARY: 104 mg/dL — AB (ref 65–99)
GLUCOSE-CAPILLARY: 124 mg/dL — AB (ref 65–99)
Glucose-Capillary: 148 mg/dL — ABNORMAL HIGH (ref 65–99)
Glucose-Capillary: 155 mg/dL — ABNORMAL HIGH (ref 65–99)

## 2016-05-22 MED ORDER — OXYCODONE HCL 5 MG PO TABS
5.0000 mg | ORAL_TABLET | ORAL | Status: DC | PRN
  Administered 2016-05-22 (×3): 5 mg via ORAL
  Administered 2016-05-23 (×2): 10 mg via ORAL
  Filled 2016-05-22 (×3): qty 1
  Filled 2016-05-22 (×2): qty 2

## 2016-05-22 MED ORDER — ZOLPIDEM TARTRATE 5 MG PO TABS: 5.0000 mg | ORAL_TABLET | Freq: Every evening | ORAL | Status: DC | PRN

## 2016-05-22 NOTE — Progress Notes (Signed)
CSW following for transfer [facility] when stable.  [facility] is continuing to update [employer]- planning for DC tomorrow after dialysis if patient is able to tolerate dialysis sitting up  CSW will continue to follow  Merlyn LotJenna Holoman, [facility] Clinical Social Worker [phone]

## 2016-05-22 NOTE — Progress Notes (Signed)
PROGRESS NOTE                                                                                                                                                                                                             Patient Demographics:    Jeffrey Walls, is a 75 y.o. male, DOB - May 04, 1941, ZOX:[id number]  Admit date - 05/14/2016   Admitting Physician Costin Otelia Sergeant, MD  Outpatient Primary MD for the patient is Theressa Stamps, MD  LOS - 8  Outpatient Specialists: Renal   Chief Complaint  Patient presents with  . Back Pain       Brief Narrative   75 year old male with history of ESRD on dialysis, combined systolic and diastolic CHF, recent prolonged hospitalization at [facility] for coag-negative staph bacteremia, source felt to be from his dialysis catheter which was replaced and placed on IV vancomycin for 6 weeks course (starting 04/04/2016). 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 Patient presented to the ED with low back pain and acute encephalopathy. Due to his progressive back pain he was unable to complete hemodialysis.. In the ED was septic with fever of 101F and mildly tachycardic. Blood work showed hemoglobin of 10.1, potassium of 2.9 and creatinine of 2.24. Lactic acid was normal. Head CT was unremarkable. CT of the lumbar spine was concerning for discitis and osteomyelitis at the L4-5 level with bony destructive changes and unable to exclude epidural abscess. An MRI of the lumbar spine was done which showed progressive discitis at the L4-5 with destruction of endplates and diffuse marrow edema. Also showed infectious/inflammatory material extending posteriorly into the epidural space from the inferior endplate of L4 within the left lateral recess.  Patient admitted to hospitalist service. Neurosurgery and ID consulted.Blood cultures  on admission growing Klebsiella. Dialysis catheter removed on 6/2 and placed a new one on 6/5. Disc aspirate growing coag negative staph aureus.   Subjective:   Back pain better. No overnight issues, slightly confused this am.    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 Disc aspirate growing coag negative staph aureus. Blood culture 1/2 this admission growing Klebsiella. Started on cefazolin. HD  catheter removed after dialysis on 6/2. New catheter  placed on 6/5. -ID following. Seen by neurosurgeon Dr. Wynetta Emerycram who recommended to continue with aggressive medical management and given lack of focal neurological deficit does not need surgical intervention at this time. -Sepsis  resolved. -On oxycodone when necessary for pain.  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 Discussed the findings with Dr. Ronelle NighMaclean where performed TEE earlier. It appeared that this could be vegetation caused by impingement of the prior infected HD catheter on the right atrial free wall. Unfortunately the new HD catheter that was placed in is again in the right atrium so a repeat TEE may not give us an exact diagnosis (vegetation versus myxoma). Another option is cardiac MRI with contrast. Discussed with nephrology who recommended against imaging with contrast given high risk of fibrosis. Noncontrast MRI would not have a better yield compared to TEE.  Discussed in detail with Dr. Jearld PiesMcClean, nephrology (Dr. Marisue HumbleSanford) and ID Dr. Luciana Axeomer. Plan on treating him with 6 more weeks of IV vancomycin. Hopefully by then his fistula would have matured and we will be able to remove the dialysis catheter and plan on repeat TEE. Stop date for IV vancomycin would be 06/29/2016.   Patient will need to be able to sit up for HD before he can be safely released. Given that  his back pain is much better now, and able to sit o the reclined without difficulty  i think he can tolerate HD.  Will plan for HD in chair tomorrow and discharge to facility in am.  His main complaint was back while sitting forlong in the chair, i have increased his oxycodone , told the RN to give him an extra pill before the HD so he can sit in the chair and tolerate the HD.       Active Problems: Klebsiella bacteremia ? Due to infected HD catheter. ID recommends Ancef for Klebsiella bacteremia for one week duration. ( stop date 05/25/2016) repeat blood culture sent to ensure clearing pending, negative so far.   Acute encephalopathy -Secondary to sepsis. MRI brain done negative for acute findings and shows atrophy and old lacunar infarct. Mentation back to normal.  Acute dysphagia Possibly due to oral/esophageal candidiasis. Improved with fluconazole.  ESRD on M, W, F Getting scheduled dialysis here. Renal following . HD catheter replaced .    coagulase-negative staph bacteremia in April 2017 As outlined above. Completed 6 weeks course of IV vancomycin on 05/16/2016. ( unclear if he received vnacomycin regularly with outpt HD due to back pain). Resumed on 6/4 for 6 weeks course further until 06/29/2616    Anemia of renal disease At baseline.around 9.    Essential hypertension Stable on home medications.     Diabetes mellitus with diabetic nephropathy, with long-term current use of insulin (HCC) Stable. Monitor on sliding scale coverage.    Protein-calorie malnutrition, severe Nutrition consult.    Hypokalemia/hyponatremia correct with HD      Code Status : full code  Family Communication  : none at bedside. Called daughter and left message.  Disposition Plan  : confirm to  see if pt able to sit on recliner for 3 hrs. Skilled nursing facility on 6/9. (Repeat blood culture sent for clearing)  Barriers For Discharge : ensuring pt able to tolerate outpt HD.    Consults  :   Renal ID Neurosurgery (Dr Wynetta Emeryram) IR    Procedures  :  CT head  CT lumbar spine  MRI lumbar spine L4-L5 disc aspiration MRI brain Replacement of hemodialysis catheter  DVT Prophylaxis  :   Sq heparin  Lab Results  Component Value Date   PLT 176 05/21/2016    Antibiotics  :    Anti-infectives    Start     Dose/Rate Route Frequency Ordered Stop   05/21/16 0911  vancomycin (VANCOCIN) 500-5 MG/100ML-% IVPB    Comments:  [place] Shi   : cabinet override      05/21/16 0911 05/21/16 2114   05/19/16 1800  vancomycin (VANCOCIN) 500 mg in sodium chloride 0.9 % 100 mL IVPB     500 mg 100 mL/hr over 60 Minutes Intravenous Every M-W-F (Hemodialysis) 05/19/16 1751     05/19/16 1800  ceFAZolin (ANCEF) IVPB 2g/100 mL premix     2 g 200 mL/hr over 30 Minutes Intravenous Every M-W-F (Hemodialysis) 05/19/16 1752     05/19/16 1746  vancomycin (VANCOCIN) 500-5 MG/100ML-% IVPB    Comments:  Woods, [name]   : cabinet override      05/19/16 1746 05/19/16 1806   05/19/16 1200  vancomycin (VANCOCIN) 500 mg in sodium chloride 0.9 % 100 mL IVPB  Status:  Discontinued     500 mg 100 mL/hr over 60 Minutes Intravenous Every M-W-F (Hemodialysis) 05/18/16 1102 05/19/16 1751   05/19/16 1200  ceFAZolin (ANCEF) IVPB 2g/100 mL premix  Status:  Discontinued     2 g 200 mL/hr over 30 Minutes Intravenous Every M-W-F (Hemodialysis) 05/18/16 1105 05/19/16 1752   05/18/16 1130  vancomycin (VANCOCIN) 1,250 mg in sodium chloride 0.9 % 250 mL IVPB     1,250 mg 166.7 mL/hr over 90 Minutes Intravenous NOW 05/18/16 1055 05/18/16 1352   05/16/16 1815  fluconazole (DIFLUCAN) tablet 100 mg     100 mg Oral Daily 05/16/16 1809 05/22/16 1041   05/16/16 1800  ceFAZolin (ANCEF) IVPB 1 g/50 mL premix     1 g 100 mL/hr over 30 Minutes Intravenous Every 24 hours 05/16/16 1634 05/18/16 2124   05/16/16 1200  vancomycin (VANCOCIN) 500 mg in sodium chloride 0.9 % 100 mL IVPB  Status:  Discontinued      500 mg 100 mL/hr over 60 Minutes Intravenous Every M-W-F (Hemodialysis) 05/14/16 1907 05/15/16 1506   05/14/16 1745  vancomycin (VANCOCIN) IVPB 1000 mg/200 mL premix     1,000 mg 200 mL/hr over 60 Minutes Intravenous  Once 05/14/16 1736 05/14/16 1914        Objective:   Filed Vitals:   05/22/16 0625 05/22/16 1041 05/22/16 1045 05/22/16 1325  BP: 157/72 161/72 161/72 127/71  Pulse: 65  66 65  Temp: 97.8 F (36.6 C)   97.8 F (36.6 C)  TempSrc: Oral   Oral  Resp: 16     Height:      Weight:      SpO2: 100%   100%    Wt Readings from Last 3 Encounters:  05/21/16 50.1 kg (110 lb 7.2 oz)  04/11/16 51.8 kg (114 lb 3.2 oz)  03/05/16 57.1 kg (125 lb 14.1 oz)     Intake/Output Summary (Last 24 hours) at 05/22/16 1827 Last data filed at 05/22/16 1400  Gross per 24 hour  Intake    640 ml  Output    100 ml  Net    540 ml     Physical Exam  Gen: not in distress, HEENT: moist mucosa, supple neck Chest: clear b/l, no added sounds CVS: N S1&S2, no murmurs,  rubs or gallop GI: soft, NT, ND, BS+ Musculoskeletal: warm, no edema,  CNS: Alert and oriented, nonfocal    Data Review:    CBC  Recent Labs Lab 05/15/16 2020 05/16/16 0945 05/19/16 0625 05/21/16 0713  WBC 8.1 5.7 3.8* 4.4  HGB 8.5* 9.0* 9.6* 9.2*  HCT 26.8* 28.7* 31.2* 29.5*  PLT 146* 141* 162 176  MCV 89.3 92.0 90.2 88.9  MCH 28.3 28.8 27.7 27.7  MCHC 31.7 31.4 30.8 31.2  RDW 16.6* 16.6* 16.4* 16.1*  LYMPHSABS 1.0  --   --   --   MONOABS 0.9  --   --   --   EOSABS 0.0  --   --   --   BASOSABS 0.0  --   --   --     Chemistries   Recent Labs Lab 05/15/16 2021 05/16/16 0945 05/19/16 0625 05/21/16 0714  NA 133* 136 137 139  K 2.8* 3.0* 3.1* 3.6  CL 97* 101 102 102  CO2 27 29 29 27   GLUCOSE 87 75 192* 95  BUN 21* 14 16 12   CREATININE 2.89* 2.12* 2.74* 2.50*  CALCIUM 7.7* 7.4* 7.6* 7.8*    ------------------------------------------------------------------------------------------------------------------ No results for input(s): CHOL, HDL, LDLCALC, TRIG, CHOLHDL, LDLDIRECT in the last 72 hours.  Lab Results  Component Value Date   HGBA1C 5.5 03/31/2016   ------------------------------------------------------------------------------------------------------------------ No results for input(s): TSH, T4TOTAL, T3FREE, THYROIDAB in the last 72 hours.  Invalid input(s): FREET3 ------------------------------------------------------------------------------------------------------------------ No results for input(s): VITAMINB12, FOLATE, FERRITIN, TIBC, IRON, RETICCTPCT in the last 72 hours.  Coagulation profile  Recent Labs Lab 05/16/16 0538  INR 1.37    No results for input(s): DDIMER in the last 72 hours.  Cardiac Enzymes No results for input(s): CKMB, TROPONINI, MYOGLOBIN in the last 168 hours.  Invalid input(s): CK ------------------------------------------------------------------------------------------------------------------    Component Value Date/Time   BNP >4500.0* 02/28/2016 2200    Inpatient Medications  Scheduled Meds: . aspirin EC  81 mg Oral Daily  .  ceFAZolin (ANCEF) IV  2 g Intravenous Q M,W,F-HD  . citalopram  20 mg Oral Daily  . feeding supplement  1 Container Oral TID BM  . folic acid  1 mg Oral Daily  . insulin aspart  0-9 Units Subcutaneous TID WC  . lisinopril  10 mg Oral Daily  . metoprolol succinate  50 mg Oral Daily  . nystatin  5 mL Oral QID  . pantoprazole  20 mg Oral Daily  . sodium chloride flush  3 mL Intravenous Q12H  . vancomycin  500 mg Intravenous Q M,W,F-HD   Continuous Infusions:  PRN Meds:.ALPRAZolam, lidocaine, oxyCODONE, zolpidem  Micro Results Recent Results (from the past 240 hour(s))  Blood culture (routine x 2)     Status: Abnormal   Collection Time: 05/14/16  4:10 PM  Result Value Ref Range Status    Specimen Description BLOOD LEFT ANTECUBITAL  Final   Special Requests BOTTLES DRAWN AEROBIC AND ANAEROBIC 5CC  Final   Culture  Setup Time   Final    GRAM NEGATIVE RODS AEROBIC BOTTLE ONLY CRITICAL RESULT CALLED TO, READ BACK BY AND VERIFIED WITH: L. Bajbus Pharm.D. 16:20 05/15/16 ([email or web address])    Culture KLEBSIELLA PNEUMONIAE (A)  Final   Report Status 05/17/2016 FINAL  Final   Organism ID, Bacteria KLEBSIELLA PNEUMONIAE  Final      Susceptibility   Klebsiella pneumoniae - MIC*    AMPICILLIN >=32 RESISTANT Resistant     CEFAZOLIN <=4 SENSITIVE Sensitive  CEFEPIME <=1 SENSITIVE Sensitive     CEFTAZIDIME <=1 SENSITIVE Sensitive     CEFTRIAXONE <=1 SENSITIVE Sensitive     CIPROFLOXACIN <=0.25 SENSITIVE Sensitive     GENTAMICIN <=1 SENSITIVE Sensitive     IMIPENEM <=0.25 SENSITIVE Sensitive     TRIMETH/SULFA <=20 SENSITIVE Sensitive     AMPICILLIN/SULBACTAM 8 SENSITIVE Sensitive     PIP/TAZO <=4 SENSITIVE Sensitive     * KLEBSIELLA PNEUMONIAE  Blood Culture ID Panel (Reflexed)     Status: Abnormal   Collection Time: 05/14/16  4:10 PM  Result Value Ref Range Status   Enterococcus species NOT DETECTED NOT DETECTED Final   Vancomycin resistance NOT DETECTED NOT DETECTED Final   Listeria monocytogenes NOT DETECTED NOT DETECTED Final   Staphylococcus species NOT DETECTED NOT DETECTED Final   Staphylococcus aureus NOT DETECTED NOT DETECTED Final   Methicillin resistance NOT DETECTED NOT DETECTED Final   Streptococcus species NOT DETECTED NOT DETECTED Final   Streptococcus agalactiae NOT DETECTED NOT DETECTED Final   Streptococcus pneumoniae NOT DETECTED NOT DETECTED Final   Streptococcus pyogenes NOT DETECTED NOT DETECTED Final   Acinetobacter baumannii NOT DETECTED NOT DETECTED Final   Enterobacteriaceae species DETECTED (A) NOT DETECTED Final    Comment: CRITICAL RESULT CALLED TO, READ BACK BY AND VERIFIED WITH: L. Bajbus Pharm.D. 16:20 05/15/16 ([email or web address])    Enterobacter cloacae complex  NOT DETECTED NOT DETECTED Final   Escherichia coli NOT DETECTED NOT DETECTED Final   Klebsiella oxytoca NOT DETECTED NOT DETECTED Final   Klebsiella pneumoniae DETECTED (A) NOT DETECTED Final    Comment: CRITICAL RESULT CALLED TO, READ BACK BY AND VERIFIED WITH: L. Bajbus Pharm.D. 16:20 05/15/16 ([email or web address])    Proteus species NOT DETECTED NOT DETECTED Final   Serratia marcescens NOT DETECTED NOT DETECTED Final   Carbapenem resistance NOT DETECTED NOT DETECTED Final   Haemophilus influenzae NOT DETECTED NOT DETECTED Final   Neisseria meningitidis NOT DETECTED NOT DETECTED Final   Pseudomonas aeruginosa NOT DETECTED NOT DETECTED Final   Candida albicans NOT DETECTED NOT DETECTED Final   Candida glabrata NOT DETECTED NOT DETECTED Final   Candida krusei NOT DETECTED NOT DETECTED Final   Candida parapsilosis NOT DETECTED NOT DETECTED Final   Candida tropicalis NOT DETECTED NOT DETECTED Final  Blood culture (routine x 2)     Status: None   Collection Time: 05/14/16  6:00 PM  Result Value Ref Range Status   Specimen Description BLOOD RIGHT HAND  Final   Special Requests BOTTLES DRAWN AEROBIC AND ANAEROBIC 10CC  Final   Culture NO GROWTH 5 DAYS  Final   Report Status 05/19/2016 FINAL  Final  Urine culture     Status: Abnormal   Collection Time: 05/15/16  4:39 AM  Result Value Ref Range Status   Specimen Description URINE, RANDOM  Final   Special Requests NONE  Final   Culture <10,000 COLONIES/mL INSIGNIFICANT GROWTH (A)  Final   Report Status 05/16/2016 FINAL  Final  Aerobic / Anaerobic Culture     Status: None   Collection Time: 05/16/16 10:38 AM  Result Value Ref Range Status   Specimen Description ABSCESS  Final   Special Requests L4 TO L5 DISCITIS  Final   Gram Stain   Final    FEW WBC PRESENT, PREDOMINANTLY PMN RARE GRAM POSITIVE COCCI IN PAIRS    Culture   Final    FEW STAPHYLOCOCCUS SPECIES (COAGULASE NEGATIVE) NO ANAEROBES ISOLATED    Report Status 05/21/2016 FINAL  Final  Organism ID, Bacteria STAPHYLOCOCCUS SPECIES (COAGULASE NEGATIVE)  Final      Susceptibility   Staphylococcus species (coagulase negative) - MIC*    CIPROFLOXACIN >=8 RESISTANT Resistant     ERYTHROMYCIN >=8 RESISTANT Resistant     GENTAMICIN <=0.5 SENSITIVE Sensitive     OXACILLIN >=4 RESISTANT Resistant     TETRACYCLINE 2 SENSITIVE Sensitive     VANCOMYCIN 1 SENSITIVE Sensitive     TRIMETH/SULFA 160 RESISTANT Resistant     CLINDAMYCIN >=8 RESISTANT Resistant     RIFAMPIN <=0.5 SENSITIVE Sensitive     Inducible Clindamycin NEGATIVE Sensitive     * FEW STAPHYLOCOCCUS SPECIES (COAGULASE NEGATIVE)  Cath Tip Culture     Status: None   Collection Time: 05/16/16  2:51 PM  Result Value Ref Range Status   Specimen Description CATH TIP  Final   Special Requests NONE  Final   Culture NO GROWTH 3 DAYS  Final   Report Status 05/19/2016 FINAL  Final  Surgical pcr screen     Status: None   Collection Time: 05/19/16  2:06 AM  Result Value Ref Range Status   MRSA, PCR NEGATIVE NEGATIVE Final   Staphylococcus aureus NEGATIVE NEGATIVE Final    Comment:        The Xpert SA Assay (FDA approved for NASAL specimens in patients over 34 years of age), is one component of a comprehensive surveillance program.  Test performance has been validated by [facility] for patients greater than or equal to 55 year old. It is not intended to diagnose infection nor to guide or monitor treatment.   Culture, blood (routine x 2)     Status: None (Preliminary result)   Collection Time: 05/19/16  8:46 PM  Result Value Ref Range Status   Specimen Description BLOOD RIGHT ANTECUBITAL  Final   Special Requests BOTTLES DRAWN AEROBIC AND ANAEROBIC 5CC  Final   Culture NO GROWTH 3 DAYS  Final   Report Status PENDING  Incomplete  Culture, blood (routine x 2)     Status: None (Preliminary result)   Collection Time: 05/19/16  8:51 PM  Result Value Ref Range Status   Specimen Description BLOOD RIGHT ANTECUBITAL  Final    Special Requests IN PEDIATRIC BOTTLE 1CC  Final   Culture NO GROWTH 3 DAYS  Final   Report Status PENDING  Incomplete    Radiology Reports Ct Head Wo Contrast  05/14/2016  CLINICAL DATA:  Fall today. Altered mental status. Agitation/confusion. 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 EXAM: CT HEAD WITHOUT CONTRAST TECHNIQUE: Contiguous axial images were obtained from the base of the skull through the vertex without intravenous contrast. COMPARISON:  05/09/2016 FINDINGS: Small remote lacunar infarcts in the left lentiform nucleus. Otherwise, the brainstem, cerebellum, cerebral peduncles, thalami, basal ganglia, basilar cisterns, and ventricular system appear within normal limits. Periventricular white matter and corona radiata hypodensities favor chronic ischemic microvascular white matter disease. No intracranial hemorrhage, mass lesion, or acute CVA. IMPRESSION: 1. No acute intracranial findings. 2. Periventricular white matter and corona radiata hypodensities favor chronic ischemic microvascular white matter disease. 3. 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 Electronically Signed   By: Gaylyn Rong M.D.   On: 05/14/2016 16:51   Ct Lumbar Spine Wo Contrast  05/14/2016  CLINICAL DATA:  Fall on Tuesday. Pain in the back. This is worsened. EXAM: CT LUMBAR SPINE WITHOUT CONTRAST TECHNIQUE: Multidetector CT imaging of the lumbar spine was performed without intravenous contrast administration. Multiplanar CT image reconstructions were also generated.  COMPARISON:  05/09/2016 FINDINGS: Acute 15% superior endplate compression fracture at T12 with endplate sclerosis and some cortical irregularity. No significant bony retropulsion. This is new compared to the MRI from 04/05/2016. Abnormal erosive findings along the inferior endplate of L4 and superior endplate of L5. These lack the characteristic sclerotic margin for Schmorl's nodes, and moreover there is some sclerosis and interval  flattening along the anterior superior endplate of L5 compared to the 04/05/2016 exam. These heighten my concern that the findings at L4-5 are probably due to discitis -osteomyelitis. There is also prominence of the paraspinal tissues at this level deep to the psoas muscles, probably inflammatory. There is also a suggestion of degenerative disc disease at this level potentially causing foraminal impingement. Strictly speaking I cannot exclude epidural abscess given the slight epidural prominence in this vicinity and concern for discitis. Along the inferior endplate of L3 there is a more typical appearing Schmorl' s node, with sclerotic margin. No vertebral subluxation is observed. The is a right twelfth rib fracture posteriorly on image 39/2 L1. Bridging spurring of the left sacroiliac joint. Aortoiliac atherosclerotic vascular disease. Bilateral pleural effusions. Bilateral perirenal stranding. Diffuse subcutaneous and mesenteric edema. IMPRESSION: 1. 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 2. There is also a new 15% superior endplate compression fracture T12 as well as a nondisplaced fracture of the right T12 rib posteriorly. 3. Bilateral pleural effusions are at least moderate in size but only partially characterized on today' s exam. 4. Perirenal, subcutaneous, and mesenteric edema. Electronically Signed   By: Gaylyn Rong M.D.   On: 05/14/2016 17:10   Mr Brain Wo Contrast  05/17/2016  CLINICAL DATA:  Sepsis. Discitis and osteomyelitis. Infected dialysis catheter. Acute encephalopathy. New onset dysphagia. EXAM: MRI HEAD WITHOUT CONTRAST TECHNIQUE: Multiplanar, multiecho pulse sequences of the brain and surrounding structures were obtained without  intravenous contrast. COMPARISON:  CT head without contrast 05/14/2016. FINDINGS: Moderate generalized atrophy and diffuse white matter disease is present. A remote lacunar infarct is present at the right thalamus. Remote asymmetric white matter ischemia is present in the anterior left frontal lobe. No acute infarct, hemorrhage, or mass lesion is present. The ventricles are proportionate to the degree of atrophy. No significant extra-axial fluid collection is present. The internal auditory canals are normal bilaterally. Flow is present in the major intracranial arteries. Globes and orbits are intact. The paranasal sinuses are clear. There is some fluid in the posterior mastoid air cells bilaterally. No obstructing nasopharyngeal lesion is present. The skullbase is within normal limits. Intracranial midline sagittal images are unremarkable. Grade 1 anterolisthesis is present at C2-3. A broad-based disc osteophyte complex is present at C3-4. IMPRESSION: 1. Moderate generalized atrophy and diffuse white matter disease without acute intracranial abnormality. 2. Remote lacunar infarct of the right thalamus. 3. Small bilateral posterior mastoid effusions. No obstructing nasopharyngeal lesion is present. 4. Spondylosis of the upper cervical spine as described. Electronically Signed   By: Marin Roberts M.D.   On: 05/17/2016 11:49   Mr Lumbar Spine Wo Contrast  05/14/2016  CLINICAL DATA:  Discitis on treatment for 1 month. Increased confusion and fever. Abnormal CT scan. EXAM: MRI LUMBAR SPINE WITHOUT CONTRAST TECHNIQUE: Multiplanar, multisequence MR imaging of the lumbar spine was performed. No intravenous contrast was administered. COMPARISON:  CT of the lumbar spine  05/14/2016. MRI of the lumbar spine 04/05/2016. FINDINGS: Segmentation: 5 non rib-bearing lumbar type vertebral bodies are present. Alignment: AP alignment is anatomic. Leftward curvature of the lumbar spine is centered at L4-5. Vertebrae: A superior  endplate fracture at T12 is again seen, incompletely healed. Diffuse edematous changes are present at L4-5. Conus medullaris: Extends to the T12-L1 level and appears normal. Paraspinal and other soft tissues: Edematous paraspinous changes are present at L4-5 with extension into the psoas musculature. There is no discrete abscess. A 10 mm cyst is present at the lower pole of the left kidney. No other focal solid organ lesions are present. Disc levels: The disc levels at L3-4 above are normal. L3-4: A broad-based disc protrusion is present. Moderate facet hypertrophy is noted. This results in mild central and bilateral foraminal stenosis. L4-5: There is further destruction of endplates. Abnormal signal is present within the disc space and extending into the epidural space on the left. Disc fragment or epidural abscess extends superiorly along the posterior margin of the left L4 vertebral body 2.5 cm from the inferior endplate of L4. Moderate left subarticular stenosis is present. Moderate left and mild right foraminal narrowing is present. L5-S1: Moderate facet hypertrophy is present bilaterally. There is mild subarticular narrowing on both sides. IMPRESSION: 1. 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 2. Infectious or inflammatory material extends posteriorly from the disc space into the epidural space with superior extension 2.5 cm from the inferior endplate of L4 within the left lateral recess. 3. Contrast could not be given due to renal insufficiency and a GFR of 28. 4. Mild central and bilateral foraminal stenosis at L3-4. 5. Moderate left and mild right foraminal stenosis at L4-5. 6. Mild subarticular narrowing bilaterally at L5-S1 secondary to moderate facet hypertrophy and spurring. Electronically Signed   By: Marin Roberts M.D.   On: 05/14/2016 21:40   Dg Esophagus  05/19/2016  CLINICAL DATA:  75 year old male with dysphagia thought related to  the esophagus. Regurgitation of solids and liquids. Initial encounter. EXAM: ESOPHOGRAM/BARIUM SWALLOW TECHNIQUE: Single contrast examination was performed using  thin barium. FLUOROSCOPY TIME:  Radiation Exposure Index (as provided by the fluoroscopic device): If the device does not provide the exposure index: Fluoroscopy Time:  2 minutes 6 seconds COMPARISON:  05/16/2016 FINDINGS: The patient was more cooperative today, and a better quality exam resulted. However, the still took fairly small swallows of barium. 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 No obstruction to the forward flow of contrast throughout the esophagus and into the stomach. Normal esophageal course and contour. The gastroesophageal junction appear normal. A 12.5 mm barium tablet was administered and passed rapidly to the distal esophagus. It state in the distal esophagus just proximal to the gastroesophageal junction for several minutes (series 17, image 1), but following additional sips of water and after another few minutes it had passed into the stomach. IMPRESSION: Presbyesophagus. No fixed areas of esophageal narrowing or stenosis. No hiatal hernia. Passage of a 12.5 mm barium tablet through the distal esophagus was mildly delayed. Electronically Signed   By: Odessa Fleming M.D.   On: 05/19/2016 09:52   Dg Esophagus  05/16/2016  CLINICAL DATA:  Difficulty swallowing. EXAM: ESOPHOGRAM/BARIUM SWALLOW TECHNIQUE: Single contrast examination was performed using  thin barium. FLUOROSCOPY TIME:  Radiation Exposure Index (as provided by the fluoroscopic device): If the device does not provide the exposure index: Fluoroscopy Time:  1 minutes and 36  seconds Number of Acquired Images: COMPARISON:  None. FINDINGS: Patient had difficulty swallowing thin barium. Piecemeal swallowing reveals no gross esophageal stricture, diverticulum, or mass-effect. Patient was given a 13 mm barium tablet, but was  unable to swallow without chewing tablet first. IMPRESSION: Very limited exam secondary to piecemeal swallowing and inability to swallow a tablet whole. No gross esophageal abnormality evident. Consider repeat evaluation after patient recovers from acute clinical picture. Electronically Signed   By: Kennith Center M.D.   On: 05/16/2016 16:38   Ir Fluoro Guide Ndl Plmt / Bx  05/16/2016  INDICATION: 75 year old male with a history of L4-L5 discitis/osteomyelitis. He has been referred for an aspiration for culture. EXAM: IR FLUORO GUIDE NEEDLE PLACEMENT /BIOPSY MEDICATIONS: None. ANESTHESIA/SEDATION: Moderate (conscious) sedation was employed during this procedure. A total of Versed 1.0 mg and Fentanyl 50 mcg was administered intravenously. Moderate Sedation Time: 20 minutes. The patient's level of consciousness and vital signs were monitored continuously by radiology nursing throughout the procedure under my direct supervision. FLUOROSCOPY TIME:  Fluoroscopy Time: 1 minutes 48 seconds (6 mGy). COMPLICATIONS: None PROCEDURE: Informed written consent was obtained from the patient after a thorough discussion of the procedural risks, benefits and alternatives. All questions were addressed. Maximal Sterile Barrier Technique was utilized including caps, mask, sterile gowns, sterile gloves, sterile drape, hand hygiene and skin antiseptic. A timeout was performed prior to the initiation of the procedure. Patient is position prone position on the fluoroscopy table. The lumbar region was prepped and draped in the usual sterile fashion. Once an oblique angle approach from the left posterior oblique was determined, the skin and subcutaneous tissues were generously infiltrated with 1% lidocaine for local anesthesia. A 20 cm 21 gauge Franceen needle was advanced under fluoroscopy into the disc space at L4-L5. Orthogonal images were acquired. Aspirate was achieved. Patient tolerated the procedure well and remained hemodynamically  stable throughout. No complications were encountered and no significant blood loss encountered. IMPRESSION: Status post fluoroscopic guided disc aspirate at the L4-L5 level. Sample sent the lab for analysis. Signed, Yvone Neu. Loreta Ave, DO [facility] Electronically Signed   By: Gilmer Mor D.O.   On: 05/16/2016 13:34   Dg Chest Port 1 View  05/19/2016  CLINICAL DATA:  Status post dialysis catheter placement EXAM: PORTABLE CHEST 1 VIEW COMPARISON:  05/09/2016 FINDINGS: There is a right IJ catheter with tip in the projection of the right atrium. The heart size is normal. Bilateral pleural effusions and moderate interstitial edema identified compatible with fluid overload. IMPRESSION: Moderate fluid overload. The right IJ catheter tip is in the right atrium. No complications after catheter insertion Electronically Signed   By: Signa Kell M.D.   On: 05/19/2016 15:00    Time Spent in minutes  25    Paone M.D on 05/22/2016 at 6:27 PM  Between 7am to 7pm - Pager - [phone]  After 7pm go to [email or web address] - password [facility]  [facility] -  Office  [phone]

## 2016-05-22 NOTE — Care Management Important Message (Signed)
Important Message  Patient Details  Name: Jeffrey Walls MRN: [id number] Date of Birth: 28-Jun-1941   Medicare Important Message Given:  Yes    Kyla BalzarineShealy, Ariston Grandison Abena 05/22/2016, 10:46 AM

## 2016-05-22 NOTE — Progress Notes (Signed)
Physical Therapy Treatment Patient Details Name: Jeffrey Walls MRN: [id number] DOB: [birth year] Today's Date: 05/22/2016    History of Present Illness Patient is a 75 y/o male with hx of DM, HTN, ESRD on HD, CAD and renal disorder presents with low back pain and acute encephalopathy. MRI spine-progressive discitis at the L4-5 with destruction of endplates and diffuse marrow edema. s/p dialysis catheter placed in IJ 6/5.    PT Comments    Pt making steady progress. Pt states he sat in recliner for HD yesterday. Per Dr. Lily Lovings note it appears he started in bed but then moved to recliner. Pt ready for dc to SNF from PT standpoint.  Follow Up Recommendations  SNF     Equipment Recommendations  None recommended by PT    Recommendations for Other Services       Precautions / Restrictions Precautions Precautions: Fall Restrictions Weight Bearing Restrictions: No    Mobility  Bed Mobility Overal bed mobility: Needs Assistance Bed Mobility: Supine to Sit     Supine to sit: Supervision     General bed mobility comments: Incr time and use of rails  Transfers Overall transfer level: Needs assistance Equipment used: 4-wheeled walker Transfers: Sit to/from Stand Sit to Stand: Min assist         General transfer comment: Assist to bring hips up and for balance  Ambulation/Gait Ambulation/Gait assistance: Min assist Ambulation Distance (Feet): 170 Feet Assistive device: Rolling walker (2 wheeled) Gait Pattern/deviations: Step-through pattern;Decreased stride length;Trunk flexed Gait velocity: decreased Gait velocity interpretation: Below normal speed for age/gender General Gait Details: Assist for balance. Pt fatigues quickly and ready to sit at end of amb.    Stairs            Wheelchair Mobility    Modified Rankin (Stroke Patients Only)       Balance Overall balance assessment: Needs assistance Sitting-balance support: No upper extremity supported;Feet  supported Sitting balance-Leahy Scale: Good     Standing balance support: Bilateral upper extremity supported Standing balance-Leahy Scale: Poor Standing balance comment: UE support for static standing                    Cognition Arousal/Alertness: Awake/alert Behavior During Therapy: WFL for tasks assessed/performed Overall Cognitive Status: No family/caregiver present to determine baseline cognitive functioning (Does not seem to have good insight into situation, impaired memory. Impaired safety awareness.)                      Exercises      General Comments        Pertinent Vitals/Pain Pain Assessment: Faces Faces Pain Scale: No hurt    Home Living                      Prior Function            PT Goals (current goals can now be found in the care plan section) Progress towards PT goals: Progressing toward goals    Frequency  Min 2X/week    PT Plan Current plan remains appropriate;Frequency needs to be updated    Co-evaluation             End of Session Equipment Utilized During Treatment: Gait belt Activity Tolerance: Patient tolerated treatment well Patient left: in chair;with call bell/phone within reach;with chair alarm set     Time: 7829-5621 PT Time Calculation (min) (ACUTE ONLY): 13 min  Charges:  $Gait Training: 8-22 mins  G Codes:      Crysten Kaman 05/22/2016, 10:01 AM Skip Mayerary Kiernan Atkerson PT [phone]

## 2016-05-22 NOTE — Progress Notes (Signed)
  [facility] Progress Note   Subjective:  Completed HD in chair yesterday after starging in bed; I think he can sit in chair successfully as outpt -- if he chooses to do so Cont on Vanc (6wk) and cefazoline (through 6/9) B Cx x2 6/5 NGTD HD yest, 1L UF via new TDC; post weight 50.1kg  Filed Vitals:   05/21/16 1043 05/21/16 1355 05/21/16 2051 05/22/16 0625  BP: 126/69 170/74 100/61 157/72  Pulse: 67 67 71 65  Temp:  97.8 F (36.6 C) 97.5 F (36.4 C) 97.8 F (36.6 C)  TempSrc:  Oral Oral Oral  Resp:   17 16  Height:      Weight:      SpO2:  100% 100% 100%    Inpatient medications: . aspirin EC  81 mg Oral Daily  .  ceFAZolin (ANCEF) IV  2 g Intravenous Q M,W,F-HD  . citalopram  20 mg Oral Daily  . feeding supplement  1 Container Oral TID BM  . fluconazole  100 mg Oral Daily  . folic acid  1 mg Oral Daily  . insulin aspart  0-9 Units Subcutaneous TID WC  . lisinopril  10 mg Oral Daily  . metoprolol succinate  50 mg Oral Daily  . nystatin  5 mL Oral QID  . pantoprazole  20 mg Oral Daily  . sodium chloride flush  3 mL Intravenous Q12H  . vancomycin  500 mg Intravenous Q M,W,F-HD     ALPRAZolam, lidocaine, oxyCODONE  Exam: Lethargic, arouses and responds approp NO jvd Chest clear bilat RRR soft SEM no RG Abd soft ntnd no ascites No LE edema TDC removed L chest  L FA AVF clean wounds, +bruit Neuro nonfocal  Dialysis: MWF [facility] 56kg 2/2.25 bath Hep none LUA AVF (maturing, placed 03/04/16)/ TDC cath 4 hr      Assessment: 1  Persistent CNS Lumbar Diskitis: on IV Vanc, RCID following 2  Klebs bacteremia - cath sepsis, TDC removed on 6/2 and replaced 6/5, IV cefazolin 3  ESRD HD MWF. AVF is not ready to use yet, placed 3/21.  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  Goal post weight 49.5kg.  No heparin  [name] Emely Fahy MD [facility] 05/22/2016, 9:41 AM    Recent Labs Lab 05/15/16 2021 05/16/16 0945 05/19/16 0625 05/21/16 0714  NA 133* 136 137 139  K 2.8* 3.0* 3.1* 3.6  CL 97* 101 102 102  CO2 27 29 29 27   GLUCOSE 87 75 192* 95  BUN 21* 14 16 12   CREATININE 2.89* 2.12* 2.74* 2.50*  CALCIUM 7.7* 7.4* 7.6* 7.8*  PHOS 3.7 2.7  --  2.6    Recent Labs Lab 05/15/16 2021 05/16/16 0945 05/21/16 0714  ALBUMIN 2.0* 1.9* 1.8*    Recent Labs Lab 05/15/16 2020 05/16/16 0945 05/19/16 0625 05/21/16 0713  WBC 8.1 5.7 3.8* 4.4  NEUTROABS 6.2  --   --   --   HGB 8.5* 9.0* 9.6* 9.2*  HCT 26.8* 28.7* 31.2* 29.5*  MCV 89.3 92.0 90.2 88.9  PLT 146* 141* 162 176

## 2016-05-23 LAB — RENAL FUNCTION PANEL
ALBUMIN: 1.9 g/dL — AB (ref 3.5–5.0)
ANION GAP: 5 (ref 5–15)
BUN: 15 mg/dL (ref 6–20)
CALCIUM: 7.7 mg/dL — AB (ref 8.9–10.3)
CO2: 28 mmol/L (ref 22–32)
Chloride: 102 mmol/L (ref 101–111)
Creatinine, Ser: 2.5 mg/dL — ABNORMAL HIGH (ref 0.61–1.24)
GFR calc Af Amer: 28 mL/min — ABNORMAL LOW (ref >60)
GFR, EST NON AFRICAN AMERICAN: 24 mL/min — AB (ref >60)
GLUCOSE: 113 mg/dL — AB (ref 65–99)
PHOSPHORUS: 3 mg/dL (ref 2.5–4.6)
Potassium: 3.9 mmol/L (ref 3.5–5.1)
SODIUM: 135 mmol/L (ref 135–145)

## 2016-05-23 LAB — CBC
HCT: 30.1 % — ABNORMAL LOW (ref 39.0–52.0)
Hemoglobin: 9.4 g/dL — ABNORMAL LOW (ref 13.0–17.0)
MCH: 27.6 pg (ref 26.0–34.0)
MCHC: 31.2 g/dL (ref 30.0–36.0)
MCV: 88.5 fL (ref 78.0–100.0)
PLATELETS: 198 10*3/uL (ref 150–400)
RBC: 3.4 MIL/uL — ABNORMAL LOW (ref 4.22–5.81)
RDW: 15.8 % — AB (ref 11.5–15.5)
WBC: 3.7 10*3/uL — AB (ref 4.0–10.5)

## 2016-05-23 LAB — GLUCOSE, CAPILLARY: GLUCOSE-CAPILLARY: 117 mg/dL — AB (ref 65–99)

## 2016-05-23 MED ORDER — CEFAZOLIN SODIUM-DEXTROSE 2-4 GM/100ML-% IV SOLN: 2.0000 g | INTRAVENOUS | Status: AC

## 2016-05-23 MED ORDER — OXYCODONE HCL 5 MG PO TABS: 5.0000 mg | ORAL_TABLET | ORAL | Status: AC | PRN

## 2016-05-23 MED ORDER — VANCOMYCIN HCL IN DEXTROSE 500-5 MG/100ML-% IV SOLN
INTRAVENOUS | Status: AC
  Administered 2016-05-23: 500 mg
  Filled 2016-05-23: qty 100

## 2016-05-23 MED ORDER — BOOST / RESOURCE BREEZE PO LIQD: 1.0000 | Freq: Three times a day (TID) | ORAL | Status: AC

## 2016-05-23 MED ORDER — METOPROLOL SUCCINATE ER 50 MG PO TB24: 50.0000 mg | ORAL_TABLET | Freq: Every day | ORAL | Status: AC

## 2016-05-23 MED ORDER — ZOLPIDEM TARTRATE 5 MG PO TABS: 5.0000 mg | ORAL_TABLET | Freq: Every evening | ORAL | Status: AC | PRN

## 2016-05-23 MED ORDER — VANCOMYCIN HCL 500 MG IV SOLR: 500.0000 mg | INTRAVENOUS | Status: AC

## 2016-05-23 MED ORDER — ALPRAZOLAM 0.25 MG PO TABS: 0.2500 mg | ORAL_TABLET | Freq: Three times a day (TID) | ORAL | Status: AC | PRN

## 2016-05-23 NOTE — Progress Notes (Signed)
Hemodialysis- pt tolerated HD well in recliner.  Total UF 2.2L d/t bp drop x1. Pt stood for post weight =50.1kg. Given Vancomycin with HD. No current complaints. Report called to primary RN.

## 2016-05-23 NOTE — Progress Notes (Signed)
[facility] Patient tele removed, IV removed intact. Pt transported by [facility].

## 2016-05-23 NOTE — Procedures (Addendum)
I was present at this dialysis session. I have reviewed the session itself and made appropriate changes.   Will attempt entirety of Tx in chair.  TDC.  Under EDW will set UF goal at 3L.  4K bath.  No renal barriers to discharge.  Will arrange ABX (Vanc, today is last day ofcefazolin) at HD upon discharge.  UPDATE: Pt completed entirety of Tx in chair without difficulty.   Filed Weights   05/21/16 0700 05/21/16 1040 05/23/16 0500  Weight: 51.1 kg (112 lb 10.5 oz) 50.1 kg (110 lb 7.2 oz) 53.6 kg (118 lb 2.7 oz)     Recent Labs Lab 05/21/16 0714  NA 139  K 3.6  CL 102  CO2 27  GLUCOSE 95  BUN 12  CREATININE 2.50*  CALCIUM 7.8*  PHOS 2.6     Recent Labs Lab 05/16/16 0945 05/19/16 0625 05/21/16 0713  WBC 5.7 3.8* 4.4  HGB 9.0* 9.6* 9.2*  HCT 28.7* 31.2* 29.5*  MCV 92.0 90.2 88.9  PLT 141* 162 176    Scheduled Meds: . aspirin EC  81 mg Oral Daily  .  ceFAZolin (ANCEF) IV  2 g Intravenous Q M,W,F-HD  . citalopram  20 mg Oral Daily  . feeding supplement  1 Container Oral TID BM  . folic acid  1 mg Oral Daily  . insulin aspart  0-9 Units Subcutaneous TID WC  . lisinopril  10 mg Oral Daily  . metoprolol succinate  50 mg Oral Daily  . nystatin  5 mL Oral QID  . pantoprazole  20 mg Oral Daily  . sodium chloride flush  3 mL Intravenous Q12H  . vancomycin  500 mg Intravenous Q M,W,F-HD   Continuous Infusions:  PRN Meds:.ALPRAZolam, lidocaine, oxyCODONE, zolpidem   Jeffrey Heckyan Dian Minahan  MD 05/23/2016, 8:43 AM

## 2016-05-23 NOTE — Clinical Social Work Placement (Signed)
   CLINICAL SOCIAL WORK PLACEMENT  NOTE  Date:  05/23/2016  Patient Details  Name: Jeffrey Walls MRN: [id number] Date of Birth: Mar 20, 1941  Clinical Social Work is seeking post-discharge placement for this patient at the Skilled  Nursing Facility level of care (*CSW will initial, date and re-position this form in  chart as items are completed):  Yes   Patient/family provided with [place] Clinical Social Work Department's list of facilities offering this level of care within the geographic area requested by the patient (or if unable, by the patient's family).  Yes   Patient/family informed of their freedom to choose among providers that offer the needed level of care, that participate in Medicare, Medicaid or managed care program needed by the patient, have an available bed and are willing to accept the patient.  Yes   Patient/family informed of [facility] ownership interest in [facility] and [facility], as well as of the fact that they are under no obligation to receive care at these facilities.  PASRR submitted to EDS on       PASRR number received on       Existing PASRR number confirmed on 05/19/16     FL2 transmitted to all facilities in geographic area requested by pt/family on 05/19/16     FL2 transmitted to all facilities within larger geographic area on       Patient informed that his/her managed care company has contracts with or will negotiate with certain facilities, including the following:        Yes   Patient/family informed of bed offers received.  [facility]     Physician recommends and patient chooses bed at      [facility].  Patient to be transferred to facility by [facility]     Patient family notified on 05/23/16 of transfer.  Name of family member notified:  Daughter     PHYSICIAN       Additional Comment:     _______________________________________________ Mearl LatinNadia S Falynn Ailey, LCSWA 05/23/2016, 2:10 PM

## 2016-05-23 NOTE — Progress Notes (Signed)
Pt had some runs of V tach. Pt is stable, lying in bed. Vitals okay. Provider notified. Will continue to monitor.

## 2016-05-23 NOTE — Progress Notes (Signed)
Attempted report x3. Will try again.

## 2016-05-23 NOTE — Progress Notes (Signed)
[facility] [place] Anticipated DC date: 05/23/16 Family notified: Delice Bisonara Transport by: Sharin MonsPTAR   Per MD [facility]. RN, patient, patient's family, and facility notified of DC. RN given number for report. DC packet on chart. Ambulance transport requested for patient.   CSW signing off.  Cristobal GoldmannNadia Yoni Lobos, [place] Clinical Social Worker [phone]

## 2016-05-23 NOTE — Discharge Summary (Signed)
Physician Discharge Summary  Brannen Koppen ZOX:[id number] DOB: [birth year] DOA: 05/14/2016  PCP: Theressa Stamps, MD  Admit date: 05/14/2016 Discharge date: 05/23/2016  Admitted From: home Disposition:  [facility]  Recommendations for Outpatient Follow-up:  1. Follow up with PCP in 1-2 weeks 2. Please obtain BMP/CBC in one week 3. You will complete IV vancomycin on 7/16 and ancef on 6/11. 4. Please follow up with Dr Wynetta Emery as needed, your neuro surgery 5. You will need repeat TEE after the completion of antibiotics after 7/16. 6. Please follow up with gastroenterology Dr Russella Dar as needed for dysphagia.  7. Please follow up with SLP at the facility.      Discharge Condition:guarded CODE STATUS: (FULL, Diet recommendation: renal diet.   Brief/Interim Summary: 75 year old male with history of ESRD on dialysis, combined systolic and diastolic CHF, recent prolonged hospitalization at [facility] for coag-negative staph bacteremia, source felt to be from his dialysis catheter which was replaced and placed on IV vancomycin for 6 weeks course (starting 04/04/2016). 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 Patient presented to the ED with low back pain and acute encephalopathy. Due to his progressive back pain he was unable to complete hemodialysis.. In the ED was septic with fever of 101F and mildly tachycardic. Blood work showed hemoglobin of 10.1, potassium of 2.9 and creatinine of 2.24. Lactic acid was normal. Head CT was unremarkable. CT of the lumbar spine was concerning for discitis and osteomyelitis at the L4-5 level with bony destructive changes and unable to exclude epidural abscess. An MRI of the lumbar spine was done which showed progressive discitis at the L4-5 with destruction of endplates and diffuse marrow edema. Also showed infectious/inflammatory material extending posteriorly into the epidural space from the  inferior endplate of L4 within the left lateral recess.  Patient admitted to hospitalist service. Neurosurgery and ID consulted.Blood cultures on admission growing Klebsiella. Dialysis catheter removed on 6/2 and placed a new one on 6/5. Disc aspirate growing coag negative staph aureus.  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 Disc aspirate growing coag negative staph aureus. Blood culture 1/2 this admission growing Klebsiella. Started on cefazolin. HD catheter removed after dialysis on 6/2. New catheter placed on 6/5. -ID following. Seen by neurosurgeon Dr. Wynetta Emery who recommended to continue with aggressive medical management and given lack of focal neurological deficit does not need surgical intervention at this time. -Sepsis resolved. -On oxycodone when necessary for pain.  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 Discussed the findings with Dr. Ronelle Nigh where performed TEE earlier. It appeared that this could be vegetation caused by impingement of the prior infected HD catheter on the right atrial free wall. Unfortunately the new HD catheter that was placed in is again in the right atrium so a repeat TEE may not give [place] an exact diagnosis (vegetation versus myxoma). Another option is cardiac MRI  with contrast. Discussed with nephrology who recommended against imaging with contrast given high risk of fibrosis.  Noncontrast MRI would not have a better yield compared to TEE.  Discussed in detail with Dr. Jearld Pies, nephrology (Dr. Marisue Humble) and ID Dr. Luciana Axe. Plan on treating him with 6 more weeks of IV vancomycin. Hopefully by then his fistula would have matured and we will be able to remove the dialysis catheter and plan on repeat TEE. Stop date for IV vancomycin would be 06/29/2016.   Klebsiella bacteremia ? Due to infected HD catheter. ID recommends Ancef for Klebsiella bacteremia for one week duration. ( stop date 05/25/2016) repeat blood culture sent to ensure clearing pending, negative so far.   Acute encephalopathy -Secondary to sepsis. MRI brain done negative for acute findings and shows atrophy and old lacunar infarct. Mentation back to normal.  Acute dysphagia Possibly due to oral/esophageal candidiasis. Improved with fluconazole. Completed treatment.   ESRD on M, W, F Getting scheduled dialysis here. Renal following . HD catheter replaced .    coagulase-negative staph bacteremia in April 2017 As outlined above. Completed 6 weeks course of IV vancomycin on 05/16/2016. ( unclear if he received vnacomycin regularly with outpt HD due to back pain). Resumed on 6/4 for 6 weeks course further until 06/29/2616   Anemia of renal disease At baseline.around 9.   Essential hypertension Stable on home medications.    Diabetes mellitus with diabetic nephropathy, with long-term current use of insulin (HCC) Stable. Monitor on sliding scale coverage.   Protein-calorie malnutrition, severe Nutrition consult.    Hypokalemia/hyponatremia correct with HD     Discharge Instructions  Discharge Instructions    Diet - low sodium heart healthy    Complete by:  As directed      Discharge instructions    Complete by:  As directed   Please follow up with infectious disease as recommended.  Please get follow up TEE after completing antibiotics.  Please follow up with PCP in 1 one week.             Medication List    STOP taking these medications        Vancomycin 750-5 MG/150ML-% Soln  Commonly known as:  VANCOCIN      TAKE these medications        acetaminophen 500 MG tablet  Commonly known as:  TYLENOL  Take 2 tablets (1,000 mg total) by mouth 3 (three) times daily.     ALPRAZolam 0.25 MG tablet  Commonly known as:  XANAX  Take 1 tablet (0.25 mg total) by mouth 3 (three) times daily as needed for anxiety.     aspirin EC 81 MG tablet  Take 81 mg by mouth daily.     ceFAZolin 2-4 GM/100ML-% IVPB  Commonly known as:  ANCEF  Inject 100 mLs (2 g total) into the vein every Monday, Wednesday, and Friday with hemodialysis.     citalopram 20 MG tablet  Commonly known as:  CELEXA  Take 20 mg by mouth daily.     feeding supplement (PRO-STAT SUGAR FREE 64) Liqd  Take 30 mLs by mouth 2 (two) times daily.     feeding supplement Liqd  Take 1 Container by mouth 2 (two) times daily between meals.     feeding supplement Liqd  Take 1 Container by mouth 3 (three) times daily between meals.     folic acid 1 MG tablet  Commonly known as:  FOLVITE  Take 1 mg by mouth daily.  insulin aspart 100 UNIT/ML injection  Commonly known as:  novoLOG  0-9 Units, Subcutaneous, 3 times daily with meals CBG < 70: implement hypoglycemia protocol CBG 70 - 120: 0 units CBG 121 - 150: 1 unit CBG 151 - 200: 2 units CBG 201 - 250: 3 units CBG 251 - 300: 5 units CBG 301 - 350: 7 units CBG 351 - 400: 9 units CBG > 400: call MD     lisinopril 20 MG tablet  Commonly known as:  PRINIVIL,ZESTRIL  Take 0.5 tablets (10 mg total) by mouth daily.     loratadine 10 MG tablet  Commonly known as:  CLARITIN  Take 10 mg by mouth daily.     metoprolol succinate 50 MG 24 hr tablet  Commonly known as:  TOPROL-XL  Take 1 tablet (50 mg total) by mouth daily. Take with or immediately following a meal.     oxyCODONE 5 MG immediate release tablet  Commonly known as:  Oxy IR/ROXICODONE  Take 1-2 tablets  (5-10 mg total) by mouth every 4 (four) hours as needed for severe pain.     PROTONIX 20 MG tablet  Generic drug:  pantoprazole  Take 20 mg by mouth daily.     vancomycin 500 mg in sodium chloride 0.9 % 100 mL  Inject 500 mg into the vein every Monday, Wednesday, and Friday with hemodialysis.     zolpidem 5 MG tablet  Commonly known as:  AMBIEN  Take 1 tablet (5 mg total) by mouth at bedtime as needed. Sleep           Follow-up Information    [facility]   Specialty:  Skilled Nursing Facility   Contact information:   [address] [place] [id number] [phone]      [facility]. Schedule an appointment as soon as possible for a visit in 1 week.   Specialty:  Internal Medicine   Contact information:   [address] [place] [id number] [phone]      Allergies  Allergen Reactions  . Ampicillin Rash    Ankle swelling  . Benadryl [Diphenhydramine Hcl] Itching    Rash    Consultations: Renal ID Neurosurgery (Dr Wynetta Emeryram) IR   Procedures/Studies: Ct Head Wo Contrast  05/14/2016  CLINICAL DATA:  Fall today. Altered mental status. Agitation/confusion. 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 EXAM: CT HEAD WITHOUT CONTRAST TECHNIQUE: Contiguous axial images were obtained from the base of the skull through the vertex without intravenous contrast. COMPARISON:  05/09/2016 FINDINGS: Small remote lacunar infarcts in the left lentiform nucleus. Otherwise, the brainstem, cerebellum, cerebral peduncles, thalami, basal ganglia, basilar cisterns, and ventricular system appear within normal limits. Periventricular white matter and corona radiata hypodensities favor chronic ischemic microvascular white matter disease. No intracranial hemorrhage, mass lesion, or acute CVA. IMPRESSION: 1. No acute intracranial findings. 2. Periventricular white matter and corona radiata hypodensities favor chronic  ischemic microvascular white matter disease. 3. 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 Electronically Signed   By: Gaylyn RongWalter  Liebkemann M.D.   On: 05/14/2016 16:51   Ct Lumbar Spine Wo Contrast  05/14/2016  CLINICAL DATA:  Fall on Tuesday. Pain in the back. This is worsened. EXAM: CT LUMBAR SPINE WITHOUT CONTRAST TECHNIQUE: Multidetector CT imaging of the lumbar spine was performed without intravenous contrast administration. Multiplanar CT image reconstructions were also generated. COMPARISON:  05/09/2016 FINDINGS: Acute 15% superior endplate compression fracture at T12  with endplate sclerosis and some cortical irregularity. No significant bony retropulsion. This is new compared to the MRI from 04/05/2016. Abnormal erosive findings along the inferior endplate of L4 and superior endplate of L5. These lack the characteristic sclerotic margin for Schmorl's nodes, and moreover there is some sclerosis and interval flattening along the anterior superior endplate of L5 compared to the 04/05/2016 exam. These heighten my concern that the findings at L4-5 are probably due to discitis -osteomyelitis. There is also prominence of the paraspinal tissues at this level deep to the psoas muscles, probably inflammatory. There is also a suggestion of degenerative disc disease at this level potentially causing foraminal impingement. Strictly speaking I cannot exclude epidural abscess given the slight epidural prominence in this vicinity and concern for discitis. Along the inferior endplate of L3 there is a more typical appearing Schmorl' s node, with sclerotic margin. No vertebral subluxation is observed. The is a right twelfth rib fracture posteriorly on image 39/2 L1. Bridging spurring of the left sacroiliac joint. Aortoiliac atherosclerotic vascular disease. Bilateral pleural effusions. Bilateral perirenal stranding. Diffuse subcutaneous and mesenteric edema. IMPRESSION: 1. Findings on today's exam  are very concerning for discitis osteomyelitis at the L4-5 level, with bony destructive findings along the endplates without sclerosis, progressive collapse along the superior endplate of L5, inflammatory paravertebral findings, and I cannot exclude epidural abscess. 2. There is also a new 15% superior endplate compression fracture T12 as well as a nondisplaced fracture of the right T12 rib posteriorly. 3. Bilateral pleural effusions are at least moderate in size but only partially characterized on today' s exam. 4. Perirenal, subcutaneous, and mesenteric edema. Electronically Signed   By: Gaylyn Rong M.D.   On: 05/14/2016 17:10   Mr Brain Wo Contrast  05/17/2016  CLINICAL DATA:  Sepsis. Discitis and osteomyelitis. Infected dialysis catheter. Acute encephalopathy. New onset dysphagia. EXAM: MRI HEAD WITHOUT CONTRAST TECHNIQUE: Multiplanar, multiecho pulse sequences of the brain and surrounding structures were obtained without intravenous contrast. COMPARISON:  CT head without contrast 05/14/2016. FINDINGS: Moderate generalized atrophy and diffuse white matter disease is present. A remote lacunar infarct is present at the right thalamus. Remote asymmetric white matter ischemia is present in the anterior left frontal lobe. No acute infarct, hemorrhage, or mass lesion is present. The ventricles are proportionate to the degree of atrophy. No significant extra-axial fluid collection is present. The internal auditory canals are normal bilaterally. Flow is present in the major intracranial arteries. Globes and orbits are intact. The paranasal sinuses are clear. There is some fluid in the posterior mastoid air cells bilaterally. No obstructing nasopharyngeal lesion is present. The skullbase is within normal limits. Intracranial midline sagittal images are unremarkable. Grade 1 anterolisthesis is present at C2-3. A broad-based disc osteophyte complex is present at C3-4. IMPRESSION: 1. Moderate generalized atrophy  and diffuse white matter disease without acute intracranial abnormality. 2. Remote lacunar infarct of the right thalamus. 3. Small bilateral posterior mastoid effusions. No obstructing nasopharyngeal lesion is present. 4. Spondylosis of the upper cervical spine as described. Electronically Signed   By: Marin Roberts M.D.   On: 05/17/2016 11:49   Mr Lumbar Spine Wo Contrast  05/14/2016  CLINICAL DATA:  Discitis on treatment for 1 month. Increased confusion and fever. Abnormal CT scan. EXAM: MRI LUMBAR SPINE WITHOUT CONTRAST TECHNIQUE: Multiplanar, multisequence MR imaging of the lumbar spine was performed. No intravenous contrast was administered. COMPARISON:  CT of the lumbar spine 05/14/2016. MRI of the lumbar spine 04/05/2016. FINDINGS: Segmentation: 5 non rib-bearing  lumbar type vertebral bodies are present. Alignment: AP alignment is anatomic. Leftward curvature of the lumbar spine is centered at L4-5. Vertebrae: A superior endplate fracture at T12 is again seen, incompletely healed. Diffuse edematous changes are present at L4-5. Conus medullaris: Extends to the T12-L1 level and appears normal. Paraspinal and other soft tissues: Edematous paraspinous changes are present at L4-5 with extension into the psoas musculature. There is no discrete abscess. A 10 mm cyst is present at the lower pole of the left kidney. No other focal solid organ lesions are present. Disc levels: The disc levels at L3-4 above are normal. L3-4: A broad-based disc protrusion is present. Moderate facet hypertrophy is noted. This results in mild central and bilateral foraminal stenosis. L4-5: There is further destruction of endplates. Abnormal signal is present within the disc space and extending into the epidural space on the left. Disc fragment or epidural abscess extends superiorly along the posterior margin of the left L4 vertebral body 2.5 cm from the inferior endplate of L4. Moderate left subarticular stenosis is present.  Moderate left and mild right foraminal narrowing is present. L5-S1: Moderate facet hypertrophy is present bilaterally. There is mild subarticular narrowing on both sides. IMPRESSION: 1. 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 2. Infectious or inflammatory material extends posteriorly from the disc space into the epidural space with superior extension 2.5 cm from the inferior endplate of L4 within the left lateral recess. 3. Contrast could not be given due to renal insufficiency and a GFR of 28. 4. Mild central and bilateral foraminal stenosis at L3-4. 5. Moderate left and mild right foraminal stenosis at L4-5. 6. Mild subarticular narrowing bilaterally at L5-S1 secondary to moderate facet hypertrophy and spurring. Electronically Signed   By: Marin Roberts M.D.   On: 05/14/2016 21:40   Dg Esophagus  05/19/2016  CLINICAL DATA:  75 year old male with dysphagia thought related to the esophagus. Regurgitation of solids and liquids. Initial encounter. EXAM: ESOPHOGRAM/BARIUM SWALLOW TECHNIQUE: Single contrast examination was performed using  thin barium. FLUOROSCOPY TIME:  Radiation Exposure Index (as provided by the fluoroscopic device): If the device does not provide the exposure index: Fluoroscopy Time:  2 minutes 6 seconds COMPARISON:  05/16/2016 FINDINGS: The patient was more cooperative today, and a better quality exam resulted. However, the still took fairly small swallows of barium. 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 No obstruction to the forward flow of contrast throughout the esophagus and into the stomach. Normal esophageal course and contour. The gastroesophageal junction appear normal. A 12.5 mm barium tablet was administered and passed rapidly to the distal esophagus. It state in the distal esophagus just proximal to the gastroesophageal junction for several minutes (series 17,  image 1), but following additional sips of water and after another few minutes it had passed into the stomach. IMPRESSION: Presbyesophagus. No fixed areas of esophageal narrowing or stenosis. No hiatal hernia. Passage of a 12.5 mm barium tablet through the distal esophagus was mildly delayed. Electronically Signed   By: Odessa Fleming M.D.   On: 05/19/2016 09:52   Dg Esophagus  05/16/2016  CLINICAL DATA:  Difficulty swallowing. EXAM: ESOPHOGRAM/BARIUM SWALLOW TECHNIQUE: Single contrast examination was performed using  thin barium. FLUOROSCOPY TIME:  Radiation Exposure Index (as provided by the fluoroscopic device): If the device does not provide the exposure index: Fluoroscopy Time:  1 minutes and 36 seconds Number of Acquired Images: COMPARISON:  None. FINDINGS: Patient had difficulty  swallowing thin barium. Piecemeal swallowing reveals no gross esophageal stricture, diverticulum, or mass-effect. Patient was given a 13 mm barium tablet, but was unable to swallow without chewing tablet first. IMPRESSION: Very limited exam secondary to piecemeal swallowing and inability to swallow a tablet whole. No gross esophageal abnormality evident. Consider repeat evaluation after patient recovers from acute clinical picture. Electronically Signed   By: Kennith Center M.D.   On: 05/16/2016 16:38   Ir Fluoro Guide Ndl Plmt / Bx  05/16/2016  INDICATION: 75 year old male with a history of L4-L5 discitis/osteomyelitis. He has been referred for an aspiration for culture. EXAM: IR FLUORO GUIDE NEEDLE PLACEMENT /BIOPSY MEDICATIONS: None. ANESTHESIA/SEDATION: Moderate (conscious) sedation was employed during this procedure. A total of Versed 1.0 mg and Fentanyl 50 mcg was administered intravenously. Moderate Sedation Time: 20 minutes. The patient's level of consciousness and vital signs were monitored continuously by radiology nursing throughout the procedure under my direct supervision. FLUOROSCOPY TIME:  Fluoroscopy Time: 1 minutes 48  seconds (6 mGy). COMPLICATIONS: None PROCEDURE: Informed written consent was obtained from the patient after a thorough discussion of the procedural risks, benefits and alternatives. All questions were addressed. Maximal Sterile Barrier Technique was utilized including caps, mask, sterile gowns, sterile gloves, sterile drape, hand hygiene and skin antiseptic. A timeout was performed prior to the initiation of the procedure. Patient is position prone position on the fluoroscopy table. The lumbar region was prepped and draped in the usual sterile fashion. Once an oblique angle approach from the left posterior oblique was determined, the skin and subcutaneous tissues were generously infiltrated with 1% lidocaine for local anesthesia. A 20 cm 21 gauge Franceen needle was advanced under fluoroscopy into the disc space at L4-L5. Orthogonal images were acquired. Aspirate was achieved. Patient tolerated the procedure well and remained hemodynamically stable throughout. No complications were encountered and no significant blood loss encountered. IMPRESSION: Status post fluoroscopic guided disc aspirate at the L4-L5 level. Sample sent the lab for analysis. Signed, Yvone Neu. Loreta Ave, DO [facility] Electronically Signed   By: Gilmer Mor D.O.   On: 05/16/2016 13:34   Dg Chest Port 1 View  05/19/2016  CLINICAL DATA:  Status post dialysis catheter placement EXAM: PORTABLE CHEST 1 VIEW COMPARISON:  05/09/2016 FINDINGS: There is a right IJ catheter with tip in the projection of the right atrium. The heart size is normal. Bilateral pleural effusions and moderate interstitial edema identified compatible with fluid overload. IMPRESSION: Moderate fluid overload. The right IJ catheter tip is in the right atrium. No complications after catheter insertion Electronically Signed   By: Signa Kell M.D.   On: 05/19/2016 15:00    (Echo, Carotid, EGD, Colonoscopy, ERCP)     Subjective:   Discharge Exam: Filed Vitals:   05/23/16 1240 05/23/16 1347  BP: 127/78 124/67  Pulse: 62 62  Temp: 97.5 F (36.4 C) 98.2 F (36.8 C)  Resp: 15 18   Filed Vitals:   05/23/16 1200 05/23/16 1230 05/23/16 1240 05/23/16 1347  BP: 113/67 91/47 127/78 124/67  Pulse: 59 62 62 62  Temp:   97.5 F (36.4 C) 98.2 F (36.8 C)  TempSrc:   Oral   Resp:   15 18  Height:      Weight:   50.1 kg (110 lb 7.2 oz)   SpO2:   97% 100%    General: Pt is alert, awake, not in acute distress Cardiovascular: RRR, S1/S2 +, no rubs, no gallops Respiratory: CTA bilaterally, no  wheezing, no rhonchi Abdominal: Soft, NT, ND, bowel sounds + Extremities: no edema, no cyanosis    The results of significant diagnostics from this hospitalization (including imaging, microbiology, ancillary and laboratory) are listed below for reference.     Microbiology: Recent Results (from the past 240 hour(s))  Blood culture (routine x 2)     Status: Abnormal   Collection Time: 05/14/16  4:10 PM  Result Value Ref Range Status   Specimen Description BLOOD LEFT ANTECUBITAL  Final   Special Requests BOTTLES DRAWN AEROBIC AND ANAEROBIC 5CC  Final   Culture  Setup Time   Final    GRAM NEGATIVE RODS AEROBIC BOTTLE ONLY CRITICAL RESULT CALLED TO, READ BACK BY AND VERIFIED WITH: L. Bajbus Pharm.D. 16:20 05/15/16 ([email or web address])    Culture KLEBSIELLA PNEUMONIAE (A)  Final   Report Status 05/17/2016 FINAL  Final   Organism ID, Bacteria KLEBSIELLA PNEUMONIAE  Final      Susceptibility   Klebsiella pneumoniae - MIC*    AMPICILLIN >=32 RESISTANT Resistant     CEFAZOLIN <=4 SENSITIVE Sensitive     CEFEPIME <=1 SENSITIVE Sensitive     CEFTAZIDIME <=1 SENSITIVE Sensitive     CEFTRIAXONE <=1 SENSITIVE Sensitive     CIPROFLOXACIN <=0.25 SENSITIVE Sensitive     GENTAMICIN <=1 SENSITIVE Sensitive     IMIPENEM <=0.25 SENSITIVE Sensitive     TRIMETH/SULFA <=20 SENSITIVE Sensitive     AMPICILLIN/SULBACTAM 8 SENSITIVE  Sensitive     PIP/TAZO <=4 SENSITIVE Sensitive     * KLEBSIELLA PNEUMONIAE  Blood Culture ID Panel (Reflexed)     Status: Abnormal   Collection Time: 05/14/16  4:10 PM  Result Value Ref Range Status   Enterococcus species NOT DETECTED NOT DETECTED Final   Vancomycin resistance NOT DETECTED NOT DETECTED Final   Listeria monocytogenes NOT DETECTED NOT DETECTED Final   Staphylococcus species NOT DETECTED NOT DETECTED Final   Staphylococcus aureus NOT DETECTED NOT DETECTED Final   Methicillin resistance NOT DETECTED NOT DETECTED Final   Streptococcus species NOT DETECTED NOT DETECTED Final   Streptococcus agalactiae NOT DETECTED NOT DETECTED Final   Streptococcus pneumoniae NOT DETECTED NOT DETECTED Final   Streptococcus pyogenes NOT DETECTED NOT DETECTED Final   Acinetobacter baumannii NOT DETECTED NOT DETECTED Final   Enterobacteriaceae species DETECTED (A) NOT DETECTED Final    Comment: CRITICAL RESULT CALLED TO, READ BACK BY AND VERIFIED WITH: L. Bajbus Pharm.D. 16:20 05/15/16 ([email or web address])    Enterobacter cloacae complex NOT DETECTED NOT DETECTED Final   Escherichia coli NOT DETECTED NOT DETECTED Final   Klebsiella oxytoca NOT DETECTED NOT DETECTED Final   Klebsiella pneumoniae DETECTED (A) NOT DETECTED Final    Comment: CRITICAL RESULT CALLED TO, READ BACK BY AND VERIFIED WITH: L. Bajbus Pharm.D. 16:20 05/15/16 ([email or web address])    Proteus species NOT DETECTED NOT DETECTED Final   Serratia marcescens NOT DETECTED NOT DETECTED Final   Carbapenem resistance NOT DETECTED NOT DETECTED Final   Haemophilus influenzae NOT DETECTED NOT DETECTED Final   Neisseria meningitidis NOT DETECTED NOT DETECTED Final   Pseudomonas aeruginosa NOT DETECTED NOT DETECTED Final   Candida albicans NOT DETECTED NOT DETECTED Final   Candida glabrata NOT DETECTED NOT DETECTED Final   Candida krusei NOT DETECTED NOT DETECTED Final   Candida parapsilosis NOT DETECTED NOT DETECTED Final   Candida tropicalis NOT DETECTED  NOT DETECTED Final  Blood culture (routine x 2)     Status: None   Collection Time: 05/14/16  6:00 PM  Result Value Ref  Range Status   Specimen Description BLOOD RIGHT HAND  Final   Special Requests BOTTLES DRAWN AEROBIC AND ANAEROBIC 10CC  Final   Culture NO GROWTH 5 DAYS  Final   Report Status 05/19/2016 FINAL  Final  Urine culture     Status: Abnormal   Collection Time: 05/15/16  4:39 AM  Result Value Ref Range Status   Specimen Description URINE, RANDOM  Final   Special Requests NONE  Final   Culture <10,000 COLONIES/mL INSIGNIFICANT GROWTH (A)  Final   Report Status 05/16/2016 FINAL  Final  Aerobic / Anaerobic Culture     Status: None   Collection Time: 05/16/16 10:38 AM  Result Value Ref Range Status   Specimen Description ABSCESS  Final   Special Requests L4 TO L5 DISCITIS  Final   Gram Stain   Final    FEW WBC PRESENT, PREDOMINANTLY PMN RARE GRAM POSITIVE COCCI IN PAIRS    Culture   Final    FEW STAPHYLOCOCCUS SPECIES (COAGULASE NEGATIVE) NO ANAEROBES ISOLATED    Report Status 05/21/2016 FINAL  Final   Organism ID, Bacteria STAPHYLOCOCCUS SPECIES (COAGULASE NEGATIVE)  Final      Susceptibility   Staphylococcus species (coagulase negative) - MIC*    CIPROFLOXACIN >=8 RESISTANT Resistant     ERYTHROMYCIN >=8 RESISTANT Resistant     GENTAMICIN <=0.5 SENSITIVE Sensitive     OXACILLIN >=4 RESISTANT Resistant     TETRACYCLINE 2 SENSITIVE Sensitive     VANCOMYCIN 1 SENSITIVE Sensitive     TRIMETH/SULFA 160 RESISTANT Resistant     CLINDAMYCIN >=8 RESISTANT Resistant     RIFAMPIN <=0.5 SENSITIVE Sensitive     Inducible Clindamycin NEGATIVE Sensitive     * FEW STAPHYLOCOCCUS SPECIES (COAGULASE NEGATIVE)  Cath Tip Culture     Status: None   Collection Time: 05/16/16  2:51 PM  Result Value Ref Range Status   Specimen Description CATH TIP  Final   Special Requests NONE  Final   Culture NO GROWTH 3 DAYS  Final   Report Status 05/19/2016 FINAL  Final  Surgical pcr screen      Status: None   Collection Time: 05/19/16  2:06 AM  Result Value Ref Range Status   MRSA, PCR NEGATIVE NEGATIVE Final   Staphylococcus aureus NEGATIVE NEGATIVE Final    Comment:        The Xpert SA Assay (FDA approved for NASAL specimens in patients over 23 years of age), is one component of a comprehensive surveillance program.  Test performance has been validated by [facility] for patients greater than or equal to 45 year old. It is not intended to diagnose infection nor to guide or monitor treatment.   Culture, blood (routine x 2)     Status: None (Preliminary result)   Collection Time: 05/19/16  8:46 PM  Result Value Ref Range Status   Specimen Description BLOOD RIGHT ANTECUBITAL  Final   Special Requests BOTTLES DRAWN AEROBIC AND ANAEROBIC 5CC  Final   Culture NO GROWTH 3 DAYS  Final   Report Status PENDING  Incomplete  Culture, blood (routine x 2)     Status: None (Preliminary result)   Collection Time: 05/19/16  8:51 PM  Result Value Ref Range Status   Specimen Description BLOOD RIGHT ANTECUBITAL  Final   Special Requests IN PEDIATRIC BOTTLE 1CC  Final   Culture NO GROWTH 3 DAYS  Final   Report Status PENDING  Incomplete     Labs: BNP (last 3 results)  Recent Labs  02/28/16 2200  BNP >4500.0*   Basic Metabolic Panel:  Recent Labs Lab 05/19/16 0625 05/21/16 0714 05/23/16 1032  NA 137 139 135  K 3.1* 3.6 3.9  CL 102 102 102  CO2 29 27 28   GLUCOSE 192* 95 113*  BUN 16 12 15   CREATININE 2.74* 2.50* 2.50*  CALCIUM 7.6* 7.8* 7.7*  PHOS  --  2.6 3.0   Liver Function Tests:  Recent Labs Lab 05/21/16 0714 05/23/16 1032  ALBUMIN 1.8* 1.9*   No results for input(s): LIPASE, AMYLASE in the last 168 hours. No results for input(s): AMMONIA in the last 168 hours. CBC:  Recent Labs Lab 05/19/16 0625 05/21/16 0713 05/23/16 1032  WBC 3.8* 4.4 3.7*  HGB 9.6* 9.2* 9.4*  HCT 31.2* 29.5* 30.1*  MCV 90.2 88.9 88.5  PLT 162 176 198   Cardiac  Enzymes: No results for input(s): CKTOTAL, CKMB, CKMBINDEX, TROPONINI in the last 168 hours. BNP: Invalid input(s): POCBNP CBG:  Recent Labs Lab 05/22/16 0735 05/22/16 1158 05/22/16 1642 05/22/16 2222 05/23/16 0818  GLUCAP 104* 148* 155* 124* 117*   D-Dimer No results for input(s): DDIMER in the last 72 hours. Hgb A1c No results for input(s): HGBA1C in the last 72 hours. Lipid Profile No results for input(s): CHOL, HDL, LDLCALC, TRIG, CHOLHDL, LDLDIRECT in the last 72 hours. Thyroid function studies No results for input(s): TSH, T4TOTAL, T3FREE, THYROIDAB in the last 72 hours.  Invalid input(s): FREET3 Anemia work up No results for input(s): VITAMINB12, FOLATE, FERRITIN, TIBC, IRON, RETICCTPCT in the last 72 hours. Urinalysis    Component Value Date/Time   COLORURINE AMBER* 05/15/2016 0438   APPEARANCEUR TURBID* 05/15/2016 0438   LABSPEC 1.031* 05/15/2016 0438   PHURINE 6.0 05/15/2016 0438   GLUCOSEU 100* 05/15/2016 0438   HGBUR NEGATIVE 05/15/2016 0438   BILIRUBINUR LARGE* 05/15/2016 0438   KETONESUR 15* 05/15/2016 0438   PROTEINUR >300* 05/15/2016 0438   NITRITE POSITIVE* 05/15/2016 0438   LEUKOCYTESUR SMALL* 05/15/2016 0438   Sepsis Labs Invalid input(s): PROCALCITONIN,  WBC,  LACTICIDVEN Microbiology Recent Results (from the past 240 hour(s))  Blood culture (routine x 2)     Status: Abnormal   Collection Time: 05/14/16  4:10 PM  Result Value Ref Range Status   Specimen Description BLOOD LEFT ANTECUBITAL  Final   Special Requests BOTTLES DRAWN AEROBIC AND ANAEROBIC 5CC  Final   Culture  Setup Time   Final    GRAM NEGATIVE RODS AEROBIC BOTTLE ONLY CRITICAL RESULT CALLED TO, READ BACK BY AND VERIFIED WITH: L. Bajbus Pharm.D. 16:20 05/15/16 ([email or web address])    Culture KLEBSIELLA PNEUMONIAE (A)  Final   Report Status 05/17/2016 FINAL  Final   Organism ID, Bacteria KLEBSIELLA PNEUMONIAE  Final      Susceptibility   Klebsiella pneumoniae - MIC*    AMPICILLIN >=32  RESISTANT Resistant     CEFAZOLIN <=4 SENSITIVE Sensitive     CEFEPIME <=1 SENSITIVE Sensitive     CEFTAZIDIME <=1 SENSITIVE Sensitive     CEFTRIAXONE <=1 SENSITIVE Sensitive     CIPROFLOXACIN <=0.25 SENSITIVE Sensitive     GENTAMICIN <=1 SENSITIVE Sensitive     IMIPENEM <=0.25 SENSITIVE Sensitive     TRIMETH/SULFA <=20 SENSITIVE Sensitive     AMPICILLIN/SULBACTAM 8 SENSITIVE Sensitive     PIP/TAZO <=4 SENSITIVE Sensitive     * KLEBSIELLA PNEUMONIAE  Blood Culture ID Panel (Reflexed)     Status: Abnormal   Collection Time: 05/14/16  4:10 PM  Result Value Ref  Range Status   Enterococcus species NOT DETECTED NOT DETECTED Final   Vancomycin resistance NOT DETECTED NOT DETECTED Final   Listeria monocytogenes NOT DETECTED NOT DETECTED Final   Staphylococcus species NOT DETECTED NOT DETECTED Final   Staphylococcus aureus NOT DETECTED NOT DETECTED Final   Methicillin resistance NOT DETECTED NOT DETECTED Final   Streptococcus species NOT DETECTED NOT DETECTED Final   Streptococcus agalactiae NOT DETECTED NOT DETECTED Final   Streptococcus pneumoniae NOT DETECTED NOT DETECTED Final   Streptococcus pyogenes NOT DETECTED NOT DETECTED Final   Acinetobacter baumannii NOT DETECTED NOT DETECTED Final   Enterobacteriaceae species DETECTED (A) NOT DETECTED Final    Comment: CRITICAL RESULT CALLED TO, READ BACK BY AND VERIFIED WITH: L. Bajbus Pharm.D. 16:20 05/15/16 ([email or web address])    Enterobacter cloacae complex NOT DETECTED NOT DETECTED Final   Escherichia coli NOT DETECTED NOT DETECTED Final   Klebsiella oxytoca NOT DETECTED NOT DETECTED Final   Klebsiella pneumoniae DETECTED (A) NOT DETECTED Final    Comment: CRITICAL RESULT CALLED TO, READ BACK BY AND VERIFIED WITH: L. Bajbus Pharm.D. 16:20 05/15/16 ([email or web address])    Proteus species NOT DETECTED NOT DETECTED Final   Serratia marcescens NOT DETECTED NOT DETECTED Final   Carbapenem resistance NOT DETECTED NOT DETECTED Final   Haemophilus influenzae NOT  DETECTED NOT DETECTED Final   Neisseria meningitidis NOT DETECTED NOT DETECTED Final   Pseudomonas aeruginosa NOT DETECTED NOT DETECTED Final   Candida albicans NOT DETECTED NOT DETECTED Final   Candida glabrata NOT DETECTED NOT DETECTED Final   Candida krusei NOT DETECTED NOT DETECTED Final   Candida parapsilosis NOT DETECTED NOT DETECTED Final   Candida tropicalis NOT DETECTED NOT DETECTED Final  Blood culture (routine x 2)     Status: None   Collection Time: 05/14/16  6:00 PM  Result Value Ref Range Status   Specimen Description BLOOD RIGHT HAND  Final   Special Requests BOTTLES DRAWN AEROBIC AND ANAEROBIC 10CC  Final   Culture NO GROWTH 5 DAYS  Final   Report Status 05/19/2016 FINAL  Final  Urine culture     Status: Abnormal   Collection Time: 05/15/16  4:39 AM  Result Value Ref Range Status   Specimen Description URINE, RANDOM  Final   Special Requests NONE  Final   Culture <10,000 COLONIES/mL INSIGNIFICANT GROWTH (A)  Final   Report Status 05/16/2016 FINAL  Final  Aerobic / Anaerobic Culture     Status: None   Collection Time: 05/16/16 10:38 AM  Result Value Ref Range Status   Specimen Description ABSCESS  Final   Special Requests L4 TO L5 DISCITIS  Final   Gram Stain   Final    FEW WBC PRESENT, PREDOMINANTLY PMN RARE GRAM POSITIVE COCCI IN PAIRS    Culture   Final    FEW STAPHYLOCOCCUS SPECIES (COAGULASE NEGATIVE) NO ANAEROBES ISOLATED    Report Status 05/21/2016 FINAL  Final   Organism ID, Bacteria STAPHYLOCOCCUS SPECIES (COAGULASE NEGATIVE)  Final      Susceptibility   Staphylococcus species (coagulase negative) - MIC*    CIPROFLOXACIN >=8 RESISTANT Resistant     ERYTHROMYCIN >=8 RESISTANT Resistant     GENTAMICIN <=0.5 SENSITIVE Sensitive     OXACILLIN >=4 RESISTANT Resistant     TETRACYCLINE 2 SENSITIVE Sensitive     VANCOMYCIN 1 SENSITIVE Sensitive     TRIMETH/SULFA 160 RESISTANT Resistant     CLINDAMYCIN >=8 RESISTANT Resistant     RIFAMPIN <=0.5 SENSITIVE  Sensitive     Inducible  Clindamycin NEGATIVE Sensitive     * FEW STAPHYLOCOCCUS SPECIES (COAGULASE NEGATIVE)  Cath Tip Culture     Status: None   Collection Time: 05/16/16  2:51 PM  Result Value Ref Range Status   Specimen Description CATH TIP  Final   Special Requests NONE  Final   Culture NO GROWTH 3 DAYS  Final   Report Status 05/19/2016 FINAL  Final  Surgical pcr screen     Status: None   Collection Time: 05/19/16  2:06 AM  Result Value Ref Range Status   MRSA, PCR NEGATIVE NEGATIVE Final   Staphylococcus aureus NEGATIVE NEGATIVE Final    Comment:        The Xpert SA Assay (FDA approved for NASAL specimens in patients over 48 years of age), is one component of a comprehensive surveillance program.  Test performance has been validated by [facility] for patients greater than or equal to 12 year old. It is not intended to diagnose infection nor to guide or monitor treatment.   Culture, blood (routine x 2)     Status: None (Preliminary result)   Collection Time: 05/19/16  8:46 PM  Result Value Ref Range Status   Specimen Description BLOOD RIGHT ANTECUBITAL  Final   Special Requests BOTTLES DRAWN AEROBIC AND ANAEROBIC 5CC  Final   Culture NO GROWTH 3 DAYS  Final   Report Status PENDING  Incomplete  Culture, blood (routine x 2)     Status: None (Preliminary result)   Collection Time: 05/19/16  8:51 PM  Result Value Ref Range Status   Specimen Description BLOOD RIGHT ANTECUBITAL  Final   Special Requests IN PEDIATRIC BOTTLE 1CC  Final   Culture NO GROWTH 3 DAYS  Final   Report Status PENDING  Incomplete     Time coordinating discharge: Over 30 minutes  SIGNED:   Kathlen Mody, MD  [facility] 05/23/2016, 1:59 PM Pager   If 7PM-7AM, please contact night-coverage [email or web address] Password [email or web address]

## 2016-05-24 LAB — CULTURE, BLOOD (ROUTINE X 2)
CULTURE: NO GROWTH
Culture: NO GROWTH

## 2016-07-31 ENCOUNTER — Ambulatory Visit: Payer: Medicare HMO | Admitting: Internal Medicine

## 2016-08-02 IMAGING — CT CT HEAD W/O CM
5 series · 19 of 47 positions shown, 21 images · non-contrast
Comparison: 05/09/2016

CLINICAL DATA: Fall today. Altered mental status.
Agitation/confusion. Worsening while at dialysis.

EXAM:
CT HEAD WITHOUT CONTRAST
TECHNIQUE: Contiguous axial images were obtained from the base of the skull
through the vertex without intravenous contrast.

[Series 201: head w/o, idose (1) · axial · non-contrast · 0.41mm/px · z∈[+302,+402]mm · 5 of 31 slices shown (1 of 2)]
[im 6/31  brain]
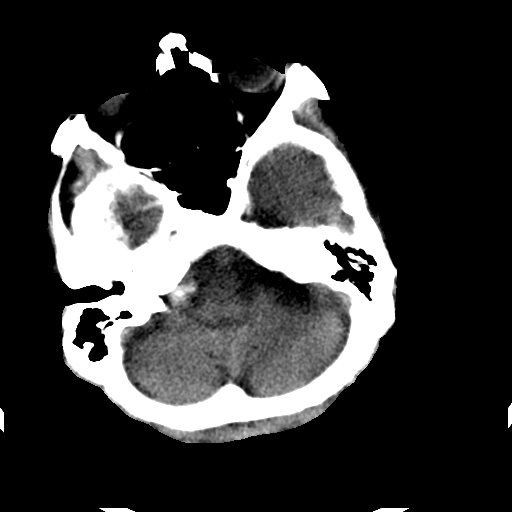
[im 11/31  brain]
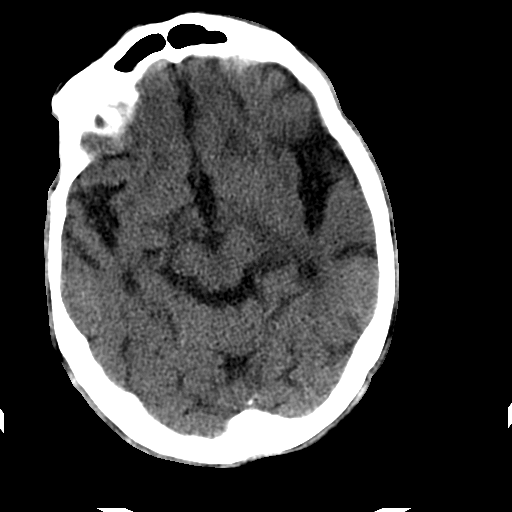
[im 16/31  brain]
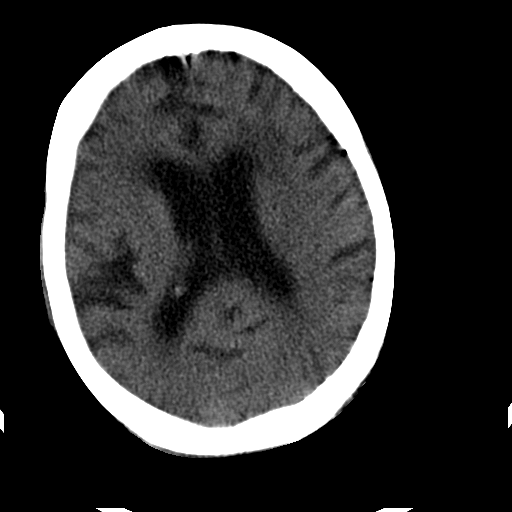
[im 21/31  brain]
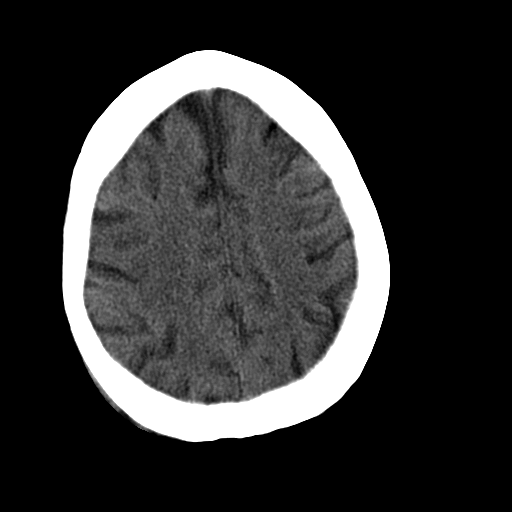
[im 26/31  brain]
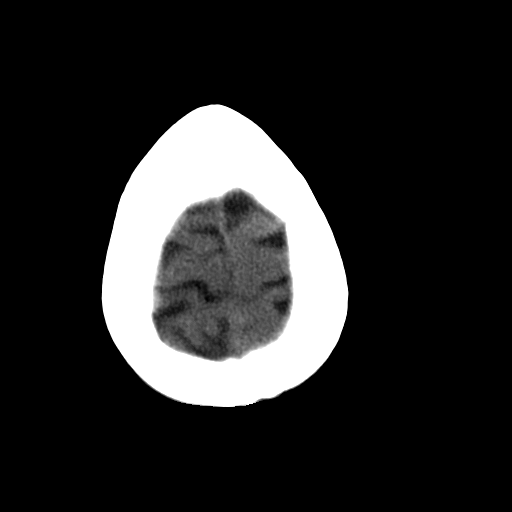

[Series 301: head w/o, idose (1) · axial · non-contrast · 0.41mm/px · z∈[+297,+402]mm · 6 of 31 slices shown, 8 images (2 of 2)]
[im 5/31  brain]
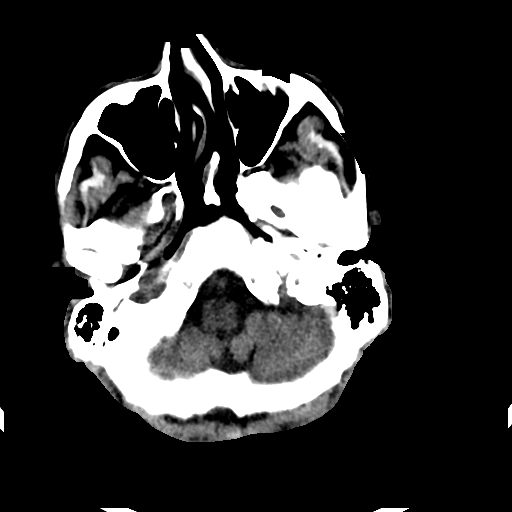
[im 5/31  bone]
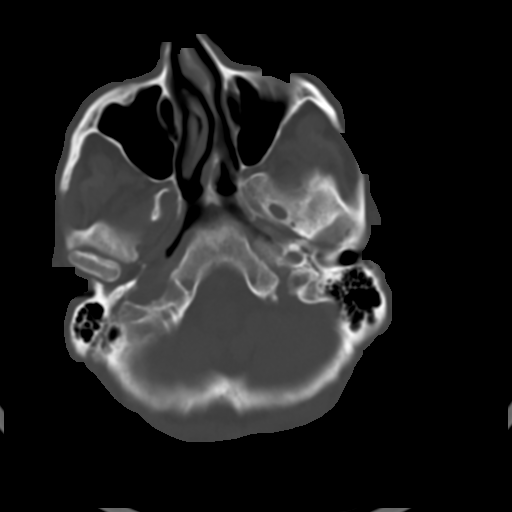
[im 9/31  brain]
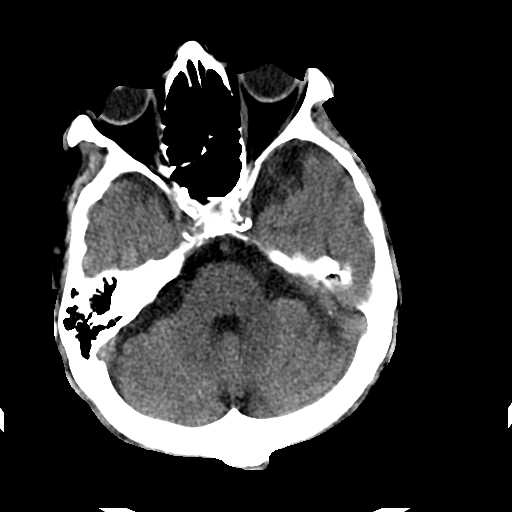
[im 13/31  brain]
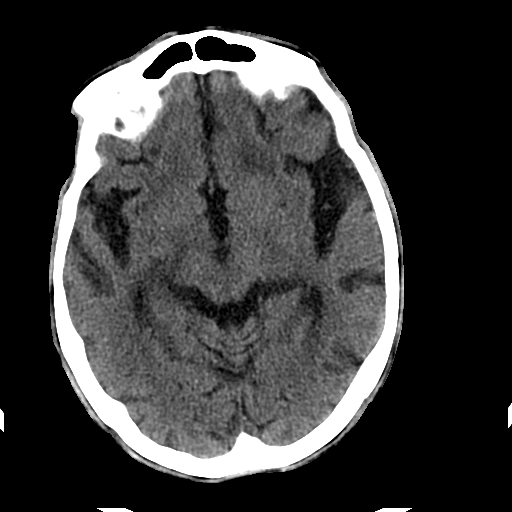
[im 18/31  brain]
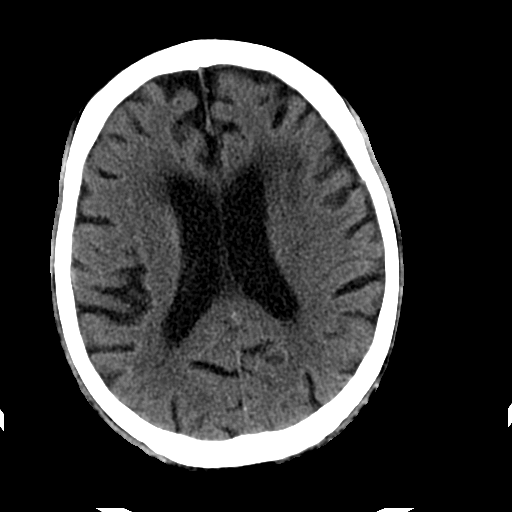
[im 22/31  brain]
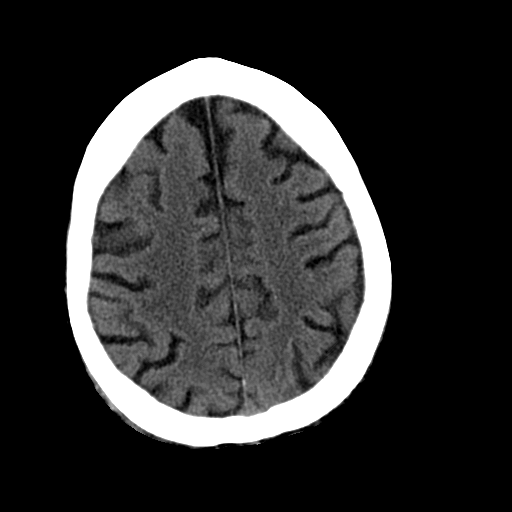
[im 22/31  bone]
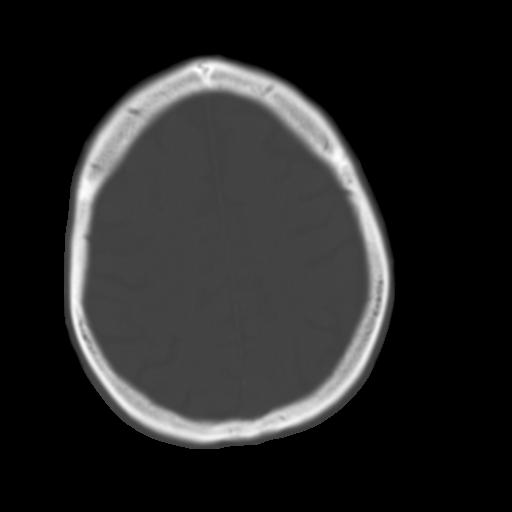
[im 26/31  brain]
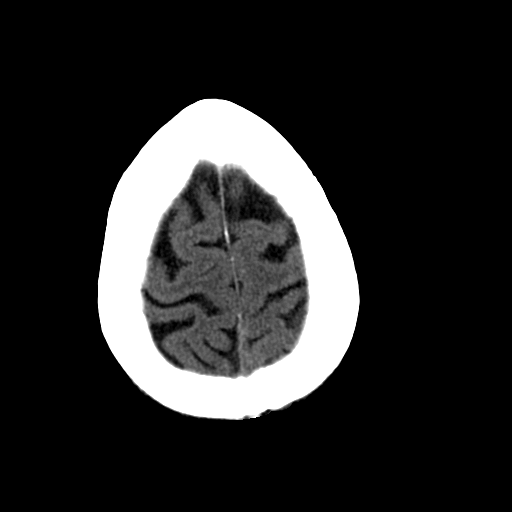

[Series 302: head w/o bone, idose (1) · axial · non-contrast · 0.41mm/px · z∈[+286,+309]mm · 2 of 62 slices shown]
[im 5/62  bone]
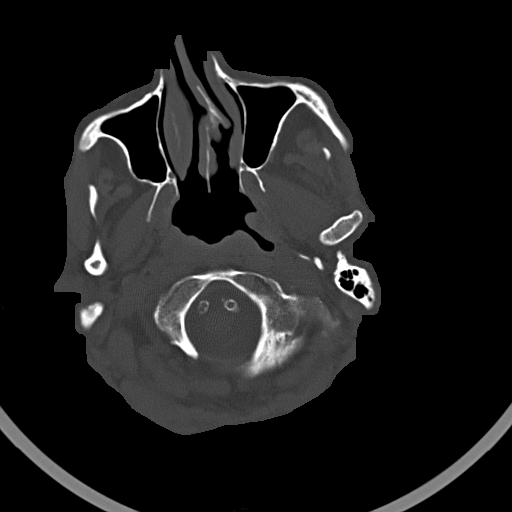
[im 14/62  bone]
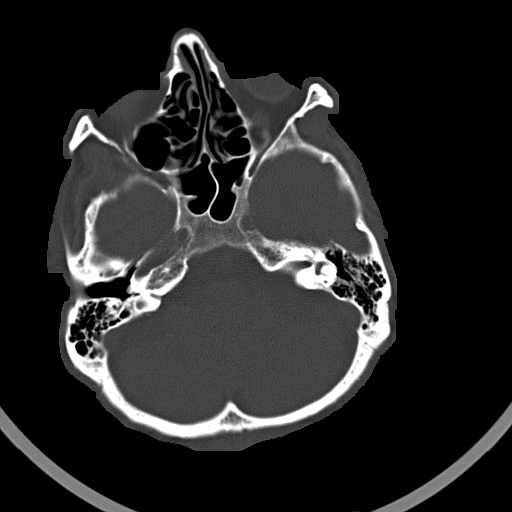

[Series 303: coronal st, idose (1) · coronal · 0.40mm/px · 3 of 69 slices shown]
[im 23/69  brain]
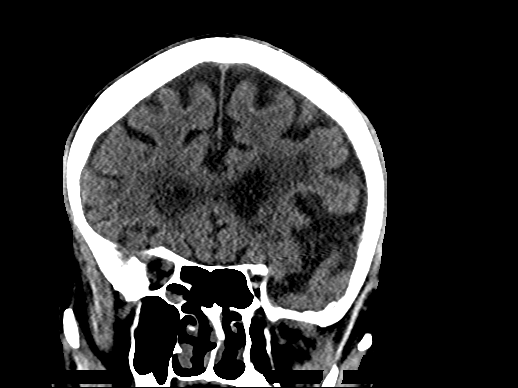
[im 31/69  brain]
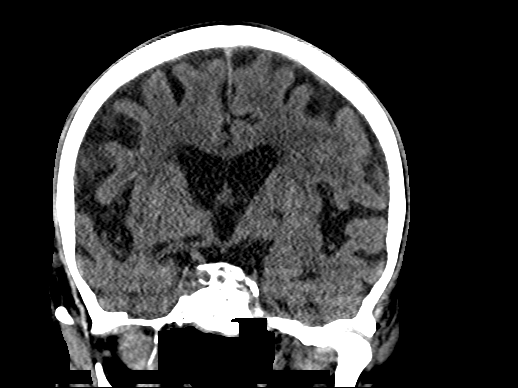
[im 38/69  brain]
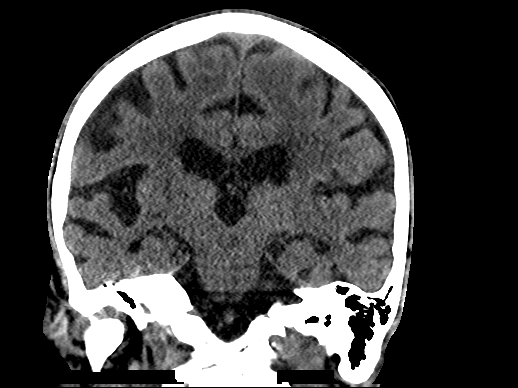

[Series 304: sagittal st, idose (1) · sagittal · 0.40mm/px · 3 of 67 slices shown]
[im 23/67  brain]
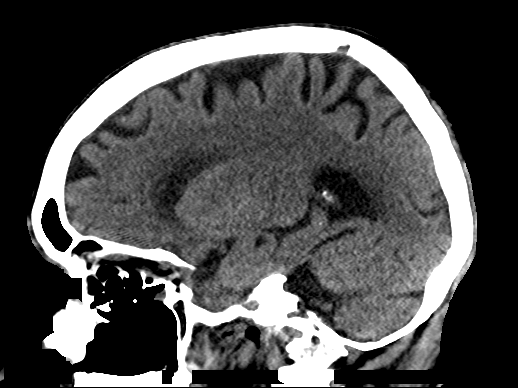
[im 34/67  brain]
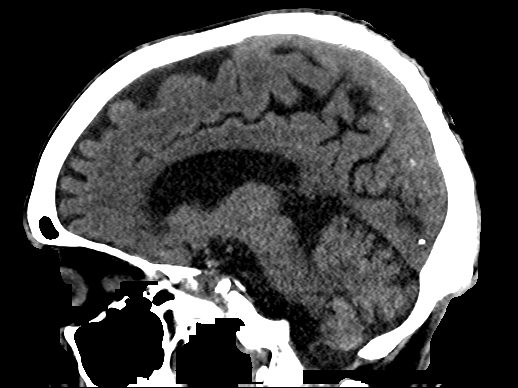
[im 45/67  brain]
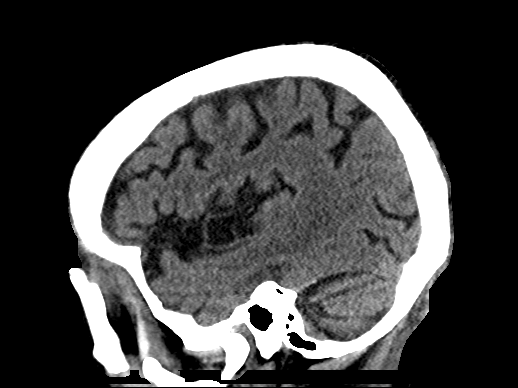

[19 of 47 positions shown; findings below may reference images not displayed]

FINDINGS: Small remote lacunar infarcts in the left lentiform nucleus.
Otherwise, the brainstem, cerebellum, cerebral peduncles, thalami,
basal ganglia, basilar cisterns, and ventricular system appear
within normal limits.

Periventricular white matter and corona radiata hypodensities favor
chronic ischemic microvascular white matter disease.

No intracranial hemorrhage, mass lesion, or acute CVA.
IMPRESSION: 1. No acute intracranial findings.
2. Periventricular white matter and corona radiata hypodensities
favor chronic ischemic microvascular white matter disease.
3. Several small remote lacunar infarcts in the left lentiform
nucleus.

## 2016-08-02 IMAGING — MR MR LUMBAR SPINE W/O CM
4 of 6 series · 18 of 48 positions shown · non-contrast
Comparison: CT of the lumbar spine 05/14/2016. MRI of the lumbar
spine 04/05/2016.

CLINICAL DATA: Discitis on treatment for 1 month. Increased
confusion and fever. Abnormal CT scan.

EXAM:
MRI LUMBAR SPINE WITHOUT CONTRAST
TECHNIQUE: Multiplanar, multisequence MR imaging of the lumbar spine was
performed. No intravenous contrast was administered.

[Series 4: T2 · sagittal · 4.0mm · 0.55mm/px · 5 of 15 slices shown (1 of 3)]
[im 1/15]
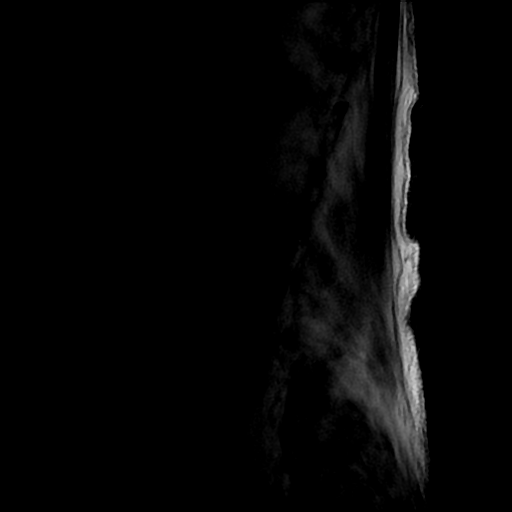
[im 4/15]
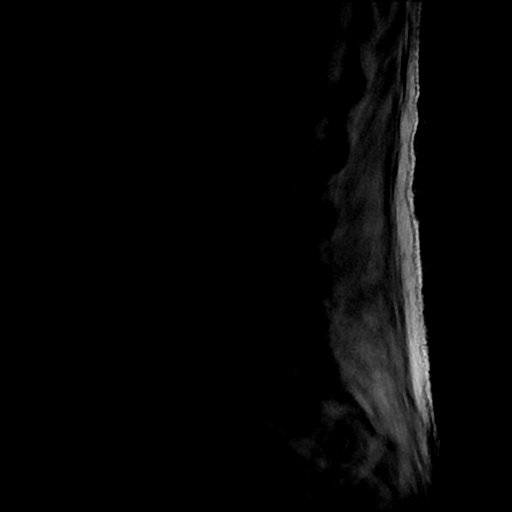
[im 8/15]
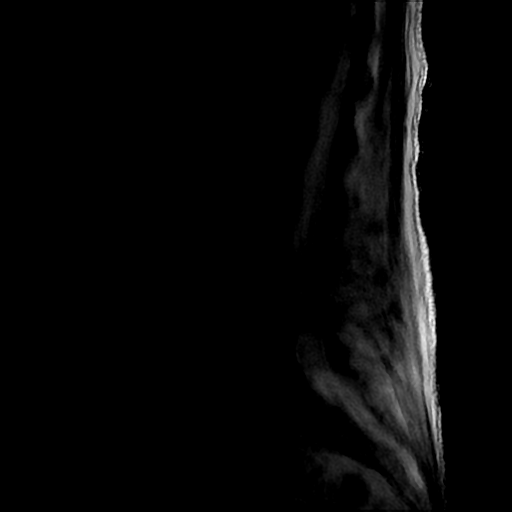
[im 11/15]
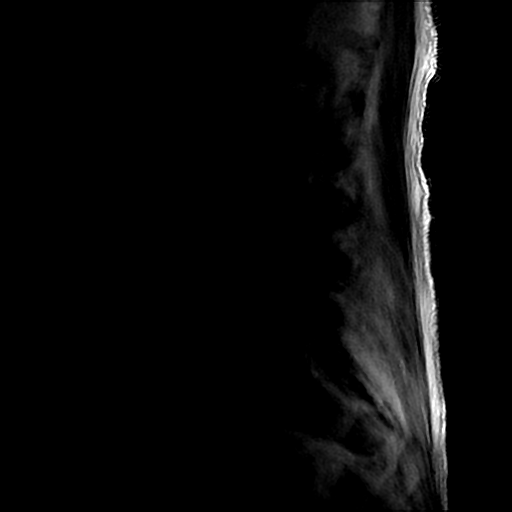
[im 15/15]
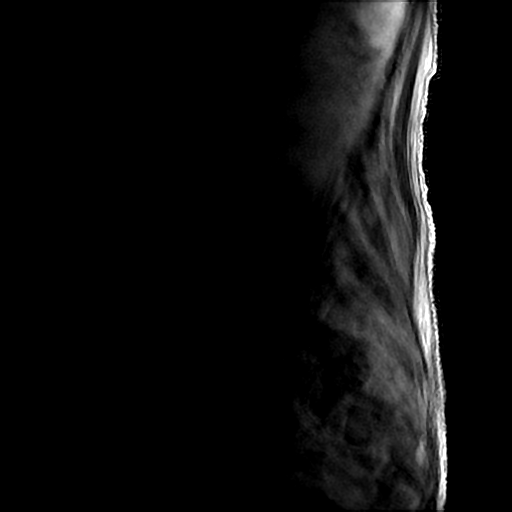

[Series 5: T1 · sagittal · 4.0mm · 0.55mm/px · 3 of 15 slices shown]
[im 1/15]
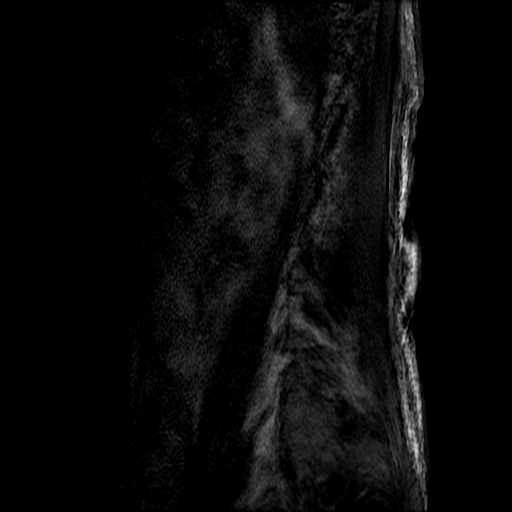
[im 8/15]
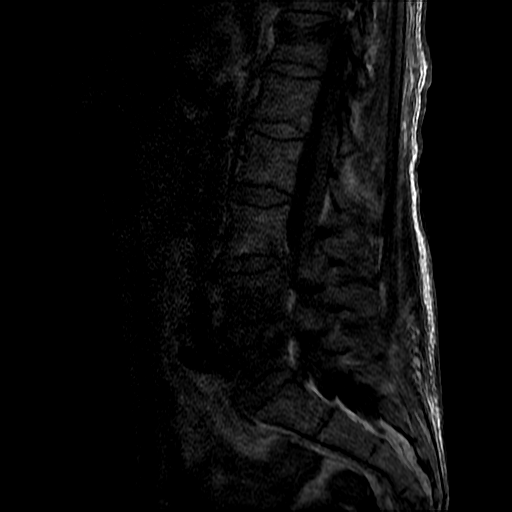
[im 15/15]
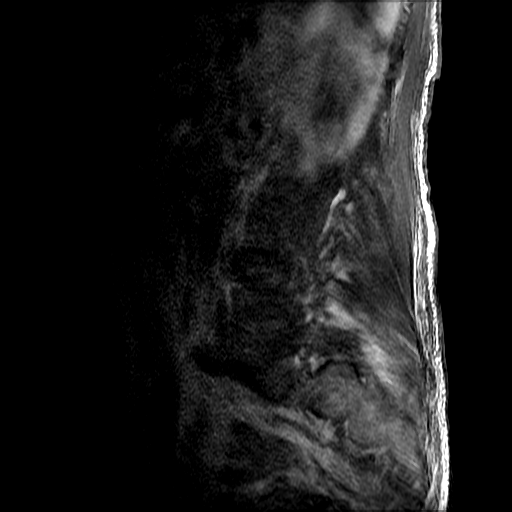

[Series 7: T2 · axial · 4.0mm · 0.39mm/px · z∈[-197,-47]mm · 7 of 35 slices shown (2 of 3)]
[im 1/35]
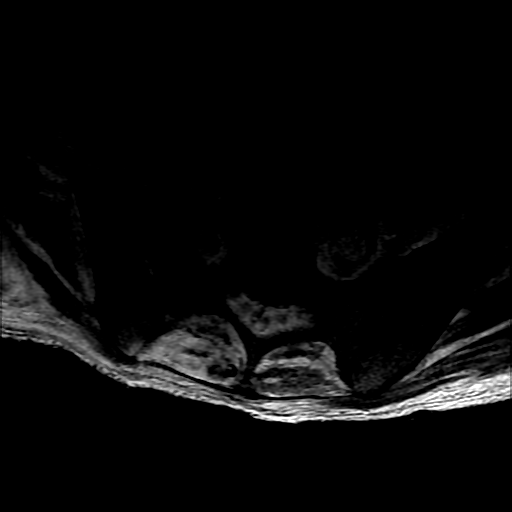
[im 6/35]
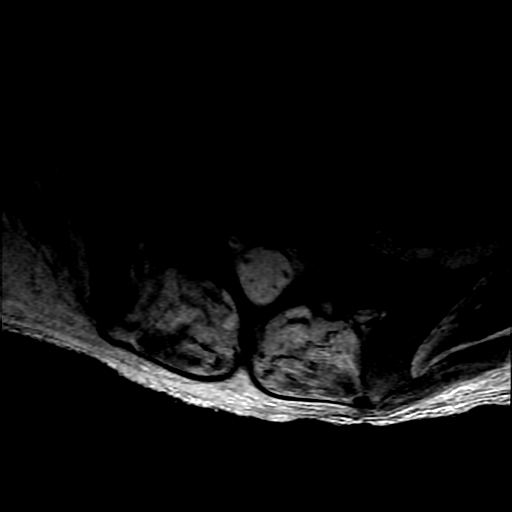
[im 12/35]
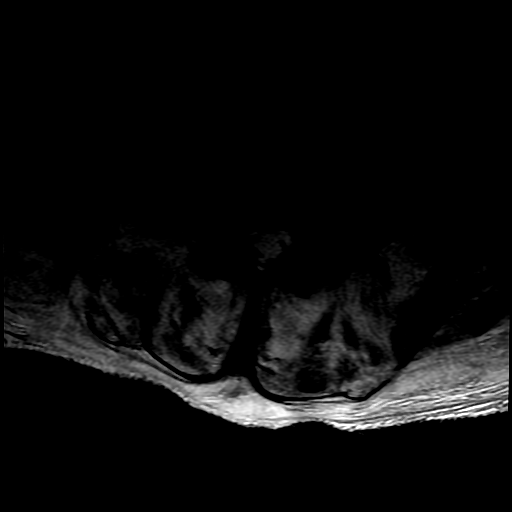
[im 15/35]
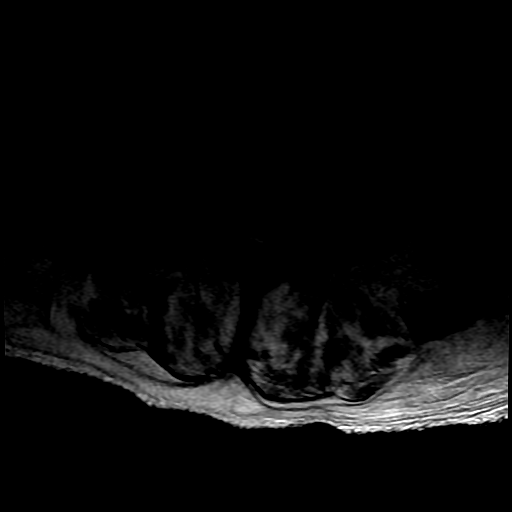
[im 18/35]
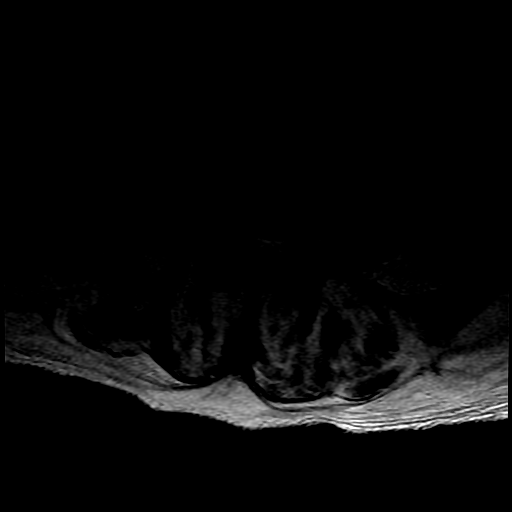
[im 20/35]
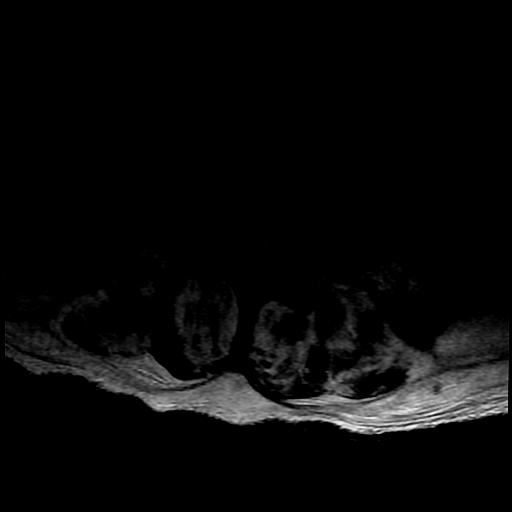
[im 29/35]
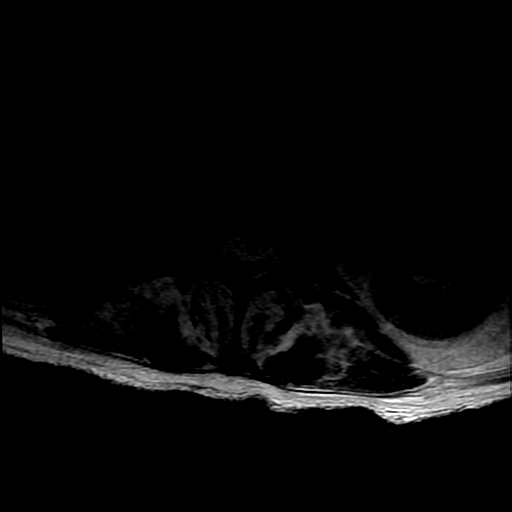

[Series 8: T2 · sagittal · 4.0mm · 0.55mm/px · 3 of 15 slices shown (3 of 3)]
[im 3/15]
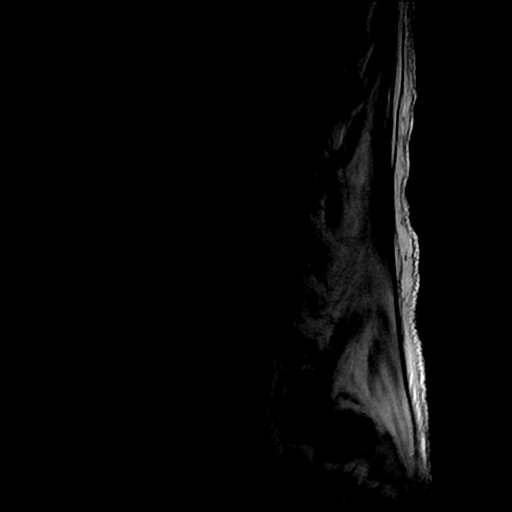
[im 9/15]
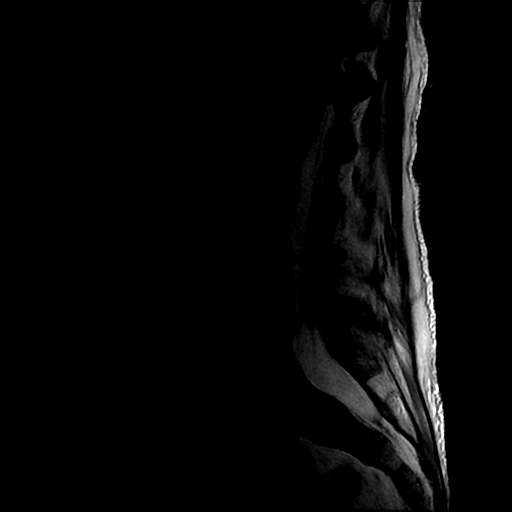
[im 15/15]
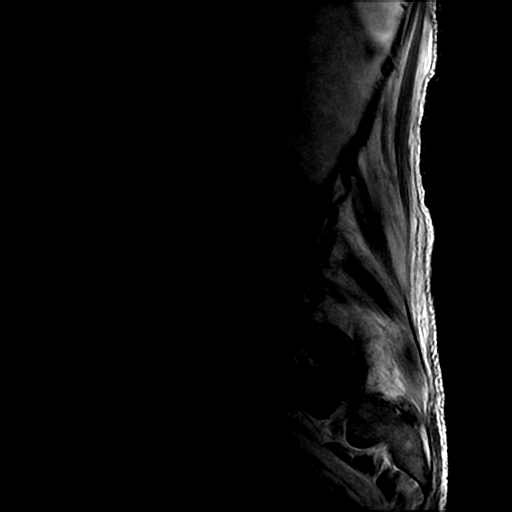

[18 of 48 positions shown; findings below may reference images not displayed]

FINDINGS: Segmentation: 5 non rib-bearing lumbar type vertebral bodies are
present.

Alignment: AP alignment is anatomic. Leftward curvature of the
lumbar spine is centered at L4-5.

Vertebrae: A superior endplate fracture at T12 is again seen,
incompletely healed. Diffuse edematous changes are present at L4-5.

Conus medullaris: Extends to the T12-L1 level and appears normal.

Paraspinal and other soft tissues: Edematous paraspinous changes are
present at L4-5 with extension into the psoas musculature. There is
no discrete abscess. A 10 mm cyst is present at the lower pole of
the left kidney. No other focal solid organ lesions are present.

Disc levels:

The disc levels at L3-4 above are normal.

L3-4: A broad-based disc protrusion is present. Moderate facet
hypertrophy is noted. This results in mild central and bilateral
foraminal stenosis.

L4-5: There is further destruction of endplates. Abnormal signal is
present within the disc space and extending into the epidural space
on the left. Disc fragment or epidural abscess extends superiorly
along the posterior margin of the left L4 vertebral body 2.5 cm from
the inferior endplate of L4. Moderate left subarticular stenosis is
present. Moderate left and mild right foraminal narrowing is
present.

L5-S1: Moderate facet hypertrophy is present bilaterally. There is
mild subarticular narrowing on both sides.
IMPRESSION: 1. Progressive discitis at L4-5 with further destruction of the
endplates and diffuse marrow edema.
2. Infectious or inflammatory material extends posteriorly from the
disc space into the epidural space with superior extension 2.5 cm
from the inferior endplate of L4 within the left lateral recess.
3. Contrast could not be given due to renal insufficiency and a GFR
of 28.
4. Mild central and bilateral foraminal stenosis at L3-4.
5. Moderate left and mild right foraminal stenosis at L4-5.
6. Mild subarticular narrowing bilaterally at L5-S1 secondary to
moderate facet hypertrophy and spurring.

## 2020-02-13 DEATH — deceased
